# Patient Record
Sex: Male | Born: 1950
Health system: Southern US, Community
[De-identification: ages and names within clinical notes are randomized; demographics above are authoritative.]

## PROBLEM LIST (undated history)

## (undated) DIAGNOSIS — Z8679 Personal history of other diseases of the circulatory system: Secondary | ICD-10-CM

## (undated) DIAGNOSIS — Z972 Presence of dental prosthetic device (complete) (partial): Secondary | ICD-10-CM

## (undated) DIAGNOSIS — E119 Type 2 diabetes mellitus without complications: Secondary | ICD-10-CM

## (undated) DIAGNOSIS — I219 Acute myocardial infarction, unspecified: Secondary | ICD-10-CM

## (undated) DIAGNOSIS — I2510 Atherosclerotic heart disease of native coronary artery without angina pectoris: Secondary | ICD-10-CM

## (undated) DIAGNOSIS — E785 Hyperlipidemia, unspecified: Secondary | ICD-10-CM

## (undated) DIAGNOSIS — C801 Malignant (primary) neoplasm, unspecified: Secondary | ICD-10-CM

## (undated) DIAGNOSIS — R0600 Dyspnea, unspecified: Secondary | ICD-10-CM

## (undated) DIAGNOSIS — J449 Chronic obstructive pulmonary disease, unspecified: Secondary | ICD-10-CM

## (undated) DIAGNOSIS — Z950 Presence of cardiac pacemaker: Secondary | ICD-10-CM

## (undated) DIAGNOSIS — Z720 Tobacco use: Secondary | ICD-10-CM

## (undated) DIAGNOSIS — M1990 Unspecified osteoarthritis, unspecified site: Secondary | ICD-10-CM

## (undated) DIAGNOSIS — J189 Pneumonia, unspecified organism: Secondary | ICD-10-CM

## (undated) DIAGNOSIS — Z9889 Other specified postprocedural states: Secondary | ICD-10-CM

## (undated) DIAGNOSIS — Z973 Presence of spectacles and contact lenses: Secondary | ICD-10-CM

## (undated) DIAGNOSIS — D135 Benign neoplasm of extrahepatic bile ducts: Secondary | ICD-10-CM

## (undated) DIAGNOSIS — K219 Gastro-esophageal reflux disease without esophagitis: Secondary | ICD-10-CM

## (undated) DIAGNOSIS — S82301A Unspecified fracture of lower end of right tibia, initial encounter for closed fracture: Secondary | ICD-10-CM

## (undated) DIAGNOSIS — F40240 Claustrophobia: Secondary | ICD-10-CM

## (undated) DIAGNOSIS — I10 Essential (primary) hypertension: Secondary | ICD-10-CM

## (undated) HISTORY — PX: APPENDECTOMY: SHX54

## (undated) HISTORY — PX: OTHER SURGICAL HISTORY: SHX169

## (undated) HISTORY — DX: Essential (primary) hypertension: I10

## (undated) HISTORY — PX: CORONARY ANGIOPLASTY WITH STENT PLACEMENT: SHX49

## (undated) HISTORY — PX: HERNIA REPAIR: SHX51

## (undated) HISTORY — DX: Hyperlipidemia, unspecified: E78.5

## (undated) HISTORY — DX: Atherosclerotic heart disease of native coronary artery without angina pectoris: I25.10

---

## 2001-06-20 HISTORY — PX: CORONARY ARTERY BYPASS GRAFT: SHX141

## 2001-08-13 ENCOUNTER — Inpatient Hospital Stay (HOSPITAL_COMMUNITY): Admission: AD | Admit: 2001-08-13 | Discharge: 2001-08-21 | Payer: Self-pay | Admitting: Cardiology

## 2001-08-16 ENCOUNTER — Encounter: Payer: Self-pay | Admitting: Thoracic Surgery (Cardiothoracic Vascular Surgery)

## 2001-08-17 ENCOUNTER — Encounter: Payer: Self-pay | Admitting: Thoracic Surgery (Cardiothoracic Vascular Surgery)

## 2001-08-18 ENCOUNTER — Encounter: Payer: Self-pay | Admitting: Thoracic Surgery (Cardiothoracic Vascular Surgery)

## 2001-08-19 ENCOUNTER — Encounter: Payer: Self-pay | Admitting: Surgery

## 2001-08-20 ENCOUNTER — Encounter: Payer: Self-pay | Admitting: Surgery

## 2004-06-20 HISTORY — PX: ABDOMINAL AORTIC ANEURYSM REPAIR: SUR1152

## 2004-08-09 ENCOUNTER — Encounter: Admission: RE | Admit: 2004-08-09 | Discharge: 2004-08-09 | Payer: Self-pay | Admitting: *Deleted

## 2004-08-19 ENCOUNTER — Ambulatory Visit: Payer: Self-pay | Admitting: Cardiology

## 2004-08-20 ENCOUNTER — Ambulatory Visit: Payer: Self-pay | Admitting: Cardiology

## 2004-08-26 ENCOUNTER — Ambulatory Visit: Payer: Self-pay | Admitting: Cardiology

## 2004-08-27 ENCOUNTER — Ambulatory Visit: Payer: Self-pay | Admitting: Internal Medicine

## 2004-08-27 ENCOUNTER — Inpatient Hospital Stay (HOSPITAL_BASED_OUTPATIENT_CLINIC_OR_DEPARTMENT_OTHER): Admission: RE | Admit: 2004-08-27 | Discharge: 2004-08-27 | Payer: Self-pay | Admitting: Cardiology

## 2004-08-31 ENCOUNTER — Ambulatory Visit: Payer: Self-pay | Admitting: Cardiology

## 2004-09-03 ENCOUNTER — Ambulatory Visit (HOSPITAL_COMMUNITY): Admission: RE | Admit: 2004-09-03 | Discharge: 2004-09-03 | Payer: Self-pay | Admitting: *Deleted

## 2004-09-14 ENCOUNTER — Encounter (INDEPENDENT_AMBULATORY_CARE_PROVIDER_SITE_OTHER): Payer: Self-pay | Admitting: *Deleted

## 2004-09-14 ENCOUNTER — Inpatient Hospital Stay (HOSPITAL_COMMUNITY): Admission: RE | Admit: 2004-09-14 | Discharge: 2004-09-19 | Payer: Self-pay | Admitting: *Deleted

## 2005-08-30 IMAGING — CR DG CHEST 2V
2 series · 2 of 2 positions shown · non-contrast
Comparison: none

CLINICAL DATA: Abdominal aortic aneurysm.  Preop for OR today.  Smoker, hypertension.
CHEST - 2 VIEW:
Patient had a chest x-ray on 08/20/01, which at the time of this reading, is not available for correlation, however, the report states that at that time, there were postoperative changes, i.e., post CABG.  Today?s exam reveals mild prominence of the appearance of the left hilum, however, I feel that this is due to pulmonary vascularity.  This will be compared with prior exam when received.  Suspicion for COPD and chronic peribronchitic changes.  Cardiac size within normal limits.  No vascular congestion.

[view not recorded (1 of 2)]
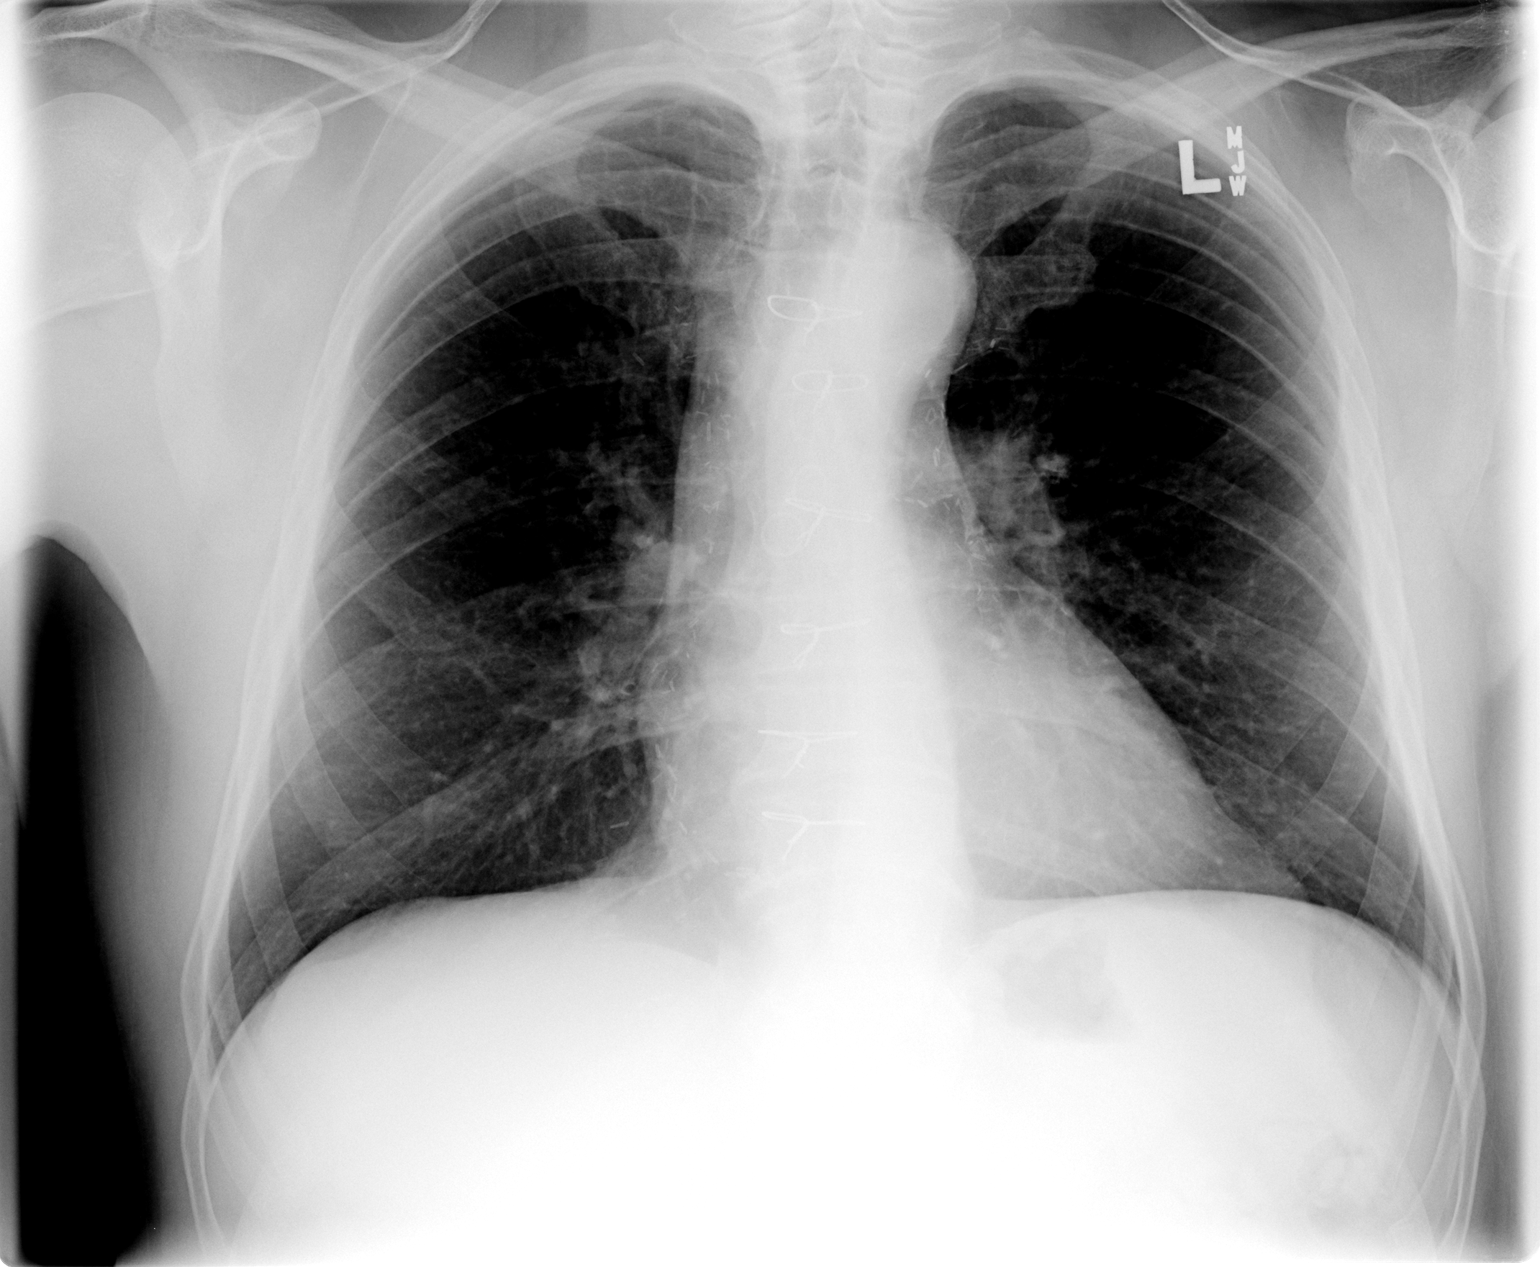

[view not recorded (2 of 2)]
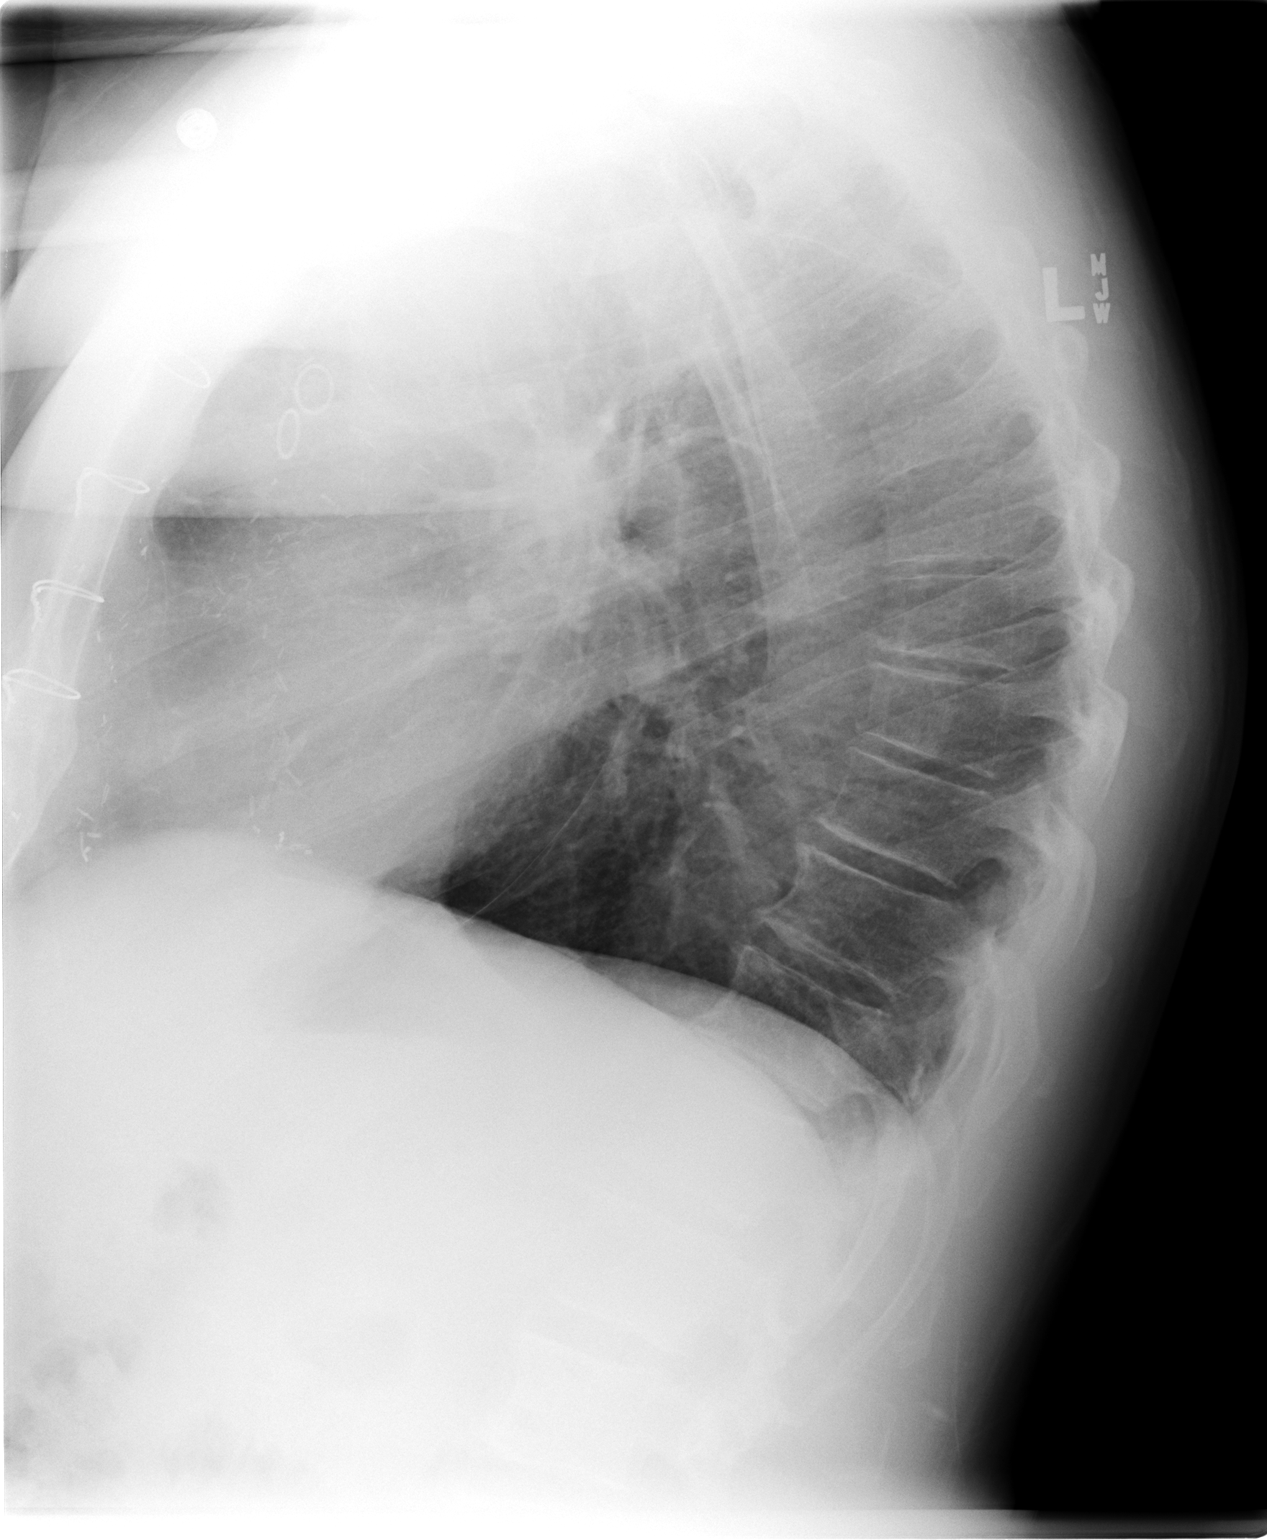

[2 of 2 positions shown; findings below may reference images not displayed]

IMPRESSION: CABG.  Query COPD.  No acute chest findings.

## 2007-04-04 ENCOUNTER — Ambulatory Visit: Payer: Self-pay | Admitting: Cardiology

## 2007-04-04 ENCOUNTER — Inpatient Hospital Stay (HOSPITAL_COMMUNITY): Admission: EM | Admit: 2007-04-04 | Discharge: 2007-04-06 | Payer: Self-pay | Admitting: Emergency Medicine

## 2007-04-04 ENCOUNTER — Ambulatory Visit: Payer: Self-pay | Admitting: Cardiovascular Disease

## 2007-05-03 ENCOUNTER — Ambulatory Visit: Payer: Self-pay | Admitting: Cardiology

## 2007-05-07 ENCOUNTER — Ambulatory Visit: Payer: Self-pay | Admitting: Cardiology

## 2007-06-25 ENCOUNTER — Ambulatory Visit: Payer: Self-pay | Admitting: Cardiology

## 2007-08-02 ENCOUNTER — Ambulatory Visit: Payer: Self-pay | Admitting: Cardiology

## 2007-11-29 ENCOUNTER — Ambulatory Visit: Payer: Self-pay | Admitting: Cardiology

## 2008-03-28 ENCOUNTER — Ambulatory Visit: Payer: Self-pay | Admitting: Cardiology

## 2008-08-06 ENCOUNTER — Ambulatory Visit: Payer: Self-pay | Admitting: Cardiology

## 2008-09-18 ENCOUNTER — Ambulatory Visit: Payer: Self-pay | Admitting: Cardiology

## 2008-10-14 ENCOUNTER — Ambulatory Visit: Payer: Self-pay | Admitting: Cardiology

## 2009-03-20 DIAGNOSIS — I25810 Atherosclerosis of coronary artery bypass graft(s) without angina pectoris: Secondary | ICD-10-CM

## 2009-03-20 DIAGNOSIS — E785 Hyperlipidemia, unspecified: Secondary | ICD-10-CM

## 2009-04-03 ENCOUNTER — Encounter: Payer: Self-pay | Admitting: Cardiology

## 2009-04-03 ENCOUNTER — Ambulatory Visit: Payer: Self-pay | Admitting: Cardiology

## 2009-05-22 ENCOUNTER — Encounter (INDEPENDENT_AMBULATORY_CARE_PROVIDER_SITE_OTHER): Payer: Self-pay | Admitting: *Deleted

## 2009-07-27 ENCOUNTER — Encounter: Payer: Self-pay | Admitting: Cardiology

## 2009-07-27 ENCOUNTER — Ambulatory Visit: Payer: Self-pay | Admitting: Cardiology

## 2009-09-15 ENCOUNTER — Encounter (INDEPENDENT_AMBULATORY_CARE_PROVIDER_SITE_OTHER): Payer: Self-pay | Admitting: *Deleted

## 2010-07-22 NOTE — Miscellaneous (Signed)
  Clinical Lists Changes  Observations: Added new observation of RS STUDY: TRACER - study completion 07/27/09 (09/15/2009 11:17)      Research Study Name: TRACER - study completion 07/27/09

## 2010-11-02 NOTE — H&P (Signed)
NAME:  Clifford Jones, JABS NO.:  [id number]   MEDICAL RECORD NO.:  [id number]          PATIENT TYPE:  INP   LOCATION:  2905                         FACILITY:  [facility]   PHYSICIAN:  Audery Amel, MD    DATE OF BIRTH:  1950/07/05   DATE OF ADMISSION:  04/04/2007  DATE OF DISCHARGE:                              HISTORY & PHYSICAL   PRIMARY CARDIOLOGIST:  Dr. Lewayne Bunting   CHIEF COMPLAINT:  Chest pain.   HISTORY OF PRESENT ILLNESS:  The patient is a 60 year old white male  with known past medical history of coronary artery disease status post  percutaneous coronary intervention in his 18s and subsequently CABG at  the age of 3, who was transferred from [facility] for further evaluation  of chest pain.  The patient was watching television earlier this evening  when he suddenly developed severe left-sided chest pain.  The patient  denied any radiation to the left arm, neck, jaw or back.  He  characterized the pain as very sharp, 10/10, and different from the  symptoms he has experienced with his prior myocardial infarctions.  He  denied any associated shortness of breath, dyspnea on exertion, nausea,  diaphoresis, palpitations, presyncope or syncope.  The patient drove  himself immediately to the [facility] emergency department for evaluation.  On arrival there, his initial EKG did reveal approximately 1- to 2-mm of  ST elevation in leads III and aVF.  The patient states that his chest  pain had resolved spontaneously prior to his arrival to [place].  A  subsequent EKG there revealed no ST elevation.  On arrival to the CCU  here at [facility], the patient was again chest-pain free.  Another EKG  was obtained which revealed normal sinus rhythm with evidence of prior  inferior and anterior myocardial infarction.  There were no dynamic ST  changes to suggest acute injury.  The patient was notably hypertensive  on arrival but otherwise without complaint.   PAST MEDICAL  HISTORY:  1. Coronary artery disease status post percutaneous coronary      intervention at age 18.  He subsequently had a CABG procedure      performed at age 37 with a LIMA to LAD, RIMA to the distal RCA, and      vein graft to D-1 and an OM.  The patient had a cardiac      catheterization in 2006 prior to a AAA surgery which revealed      patent grafts and preserved left ventricular function.  2. Status post AAA repair.  3. Hypertension.  4. Hyperlipidemia.  5. Ongoing tobacco use.   CURRENT MEDICATIONS:  Aspirin 325 mg once daily.   ALLERGIES:  No known drug allergies.   SOCIAL HISTORY:  The patient has continued to smoke approximately one  pack per day.  He uses occasional alcohol but denies any illicit  substances.  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   FAMILY HISTORY:  There is a significant family history of early coronary  disease.  His  father died in his 61s from a myocardial infarction.  His  mother died late in life from natural causes.  Interestingly, none of  his siblings have coronary disease.   PHYSICAL EXAMINATION:  Blood pressure is 166/95, heart rate is 69, O2  saturations are 97% on room air.  GENERAL:  The patient is alert and oriented x3 in no acute distress and  pleasantly conversant.  HEENT:  Normocephalic, atraumatic, EOMI, PERL, nares patent, O&P is  clear without erythema or exudate.  NECK:  Supple, full range of motion, no JVD.  His carotid upstrokes are  equal and symmetric bilaterally with no audible bruits.  CHEST:  Reveals coarse breath sounds bilaterally with no audible  wheezing.  There is no chest wall deformity or tenderness.  He has a  well-healed median sternotomy scar.  CARDIOVASCULAR EXAMINATION:  Normal S1, S2 with no audible murmurs, rubs  or gallops.  His PMI is nondisplaced in the left midclavicular line.  His peripheral pulses are 1+ and symmetric bilaterally.  ABDOMEN:  Soft, nontender,  nondistended.  Positive bowel sounds.  No  hepatosplenomegaly.  EXTREMITIES:  Reveal no clubbing, cyanosis or edema.  He is 2+ bilateral  femoral pulses with no audible bruits.  NEUROLOGIC:  Grossly nonfocal.  Sensation and strength are grossly  intact throughout.  PSYCHIATRIC:  Appropriate insight and judgment.   EKG:  Normal sinus rhythm with evidence of prior inferior and anterior  infarction.  There is no dynamic ST- or T-wave changes to suggest acute  injury or ischemia.   Labs pending.   IMPRESSION:  1. Acute coronary syndrome.  2. Coronary artery disease status post coronary artery bypass graft      surgery.  3. Abdominal aortic aneurysm status post repair in 2006.  4. Hypertension.  5. Hyperlipidemia.   PLAN:  From a cardiovascular standpoint, Clifford Jones is currently  hemodynamically stable and chest-pain free.  On review of his  electrocardiograms from [facility], his initial study did reveal ST-  elevation in the inferior leads (III and aVF).  The patient states that  his pain resolved spontaneously before arriving at [facility] and he has  had no significant symptoms since then.  His subsequent EKGs have  revealed no ST-elevation or changes concerning for acute injury.  We  will admit the patient to the CCU and cycle his cardiac biomarkers.  The  patient was initiated on unfractionated heparin at [facility] and we will  continue with this mode of therapy.  He does take aspirin daily and I  will continue 325 mg p.o. daily.  At this point, the patient is  experiencing no chest pain, he has no dynamic ST changes, and therefore  we will await his cardiac biomarkers for initiating a IIb/IIIa  inhibitor.  The patient continues to be quite hypertensive and we will  treat with metoprolol IV and p.o.  In addition, I will initiate  enalapril 10 mg p.o. daily.  We will titrate his nitroglycerin drip for  additional blood pressure control as needed.  We will check a fasting  lipid  profile, start atorvastatin 80 mg daily.  Will check a  transthoracic echocardiogram in a.m. to assess his left ventricular  function and wall motion.  Given the nature of his presentation and his  history, the patient will likely require repeat cardiac catheterization  to assess the status of his bypass graft anatomy.  It should be noted  that the patient is somewhat adverse to taking medications and therefore  a drug-eluting stent may not be in his best interests, as it is unlikely  that the patient will remain on Plavix for the recommended 1 year.  We  will maintain the patient n.p.o. in anticipation of a cardiac  catheterization in the a.m.     Audery Amel, MD  Electronically Signed    SHG/MEDQ  D:  04/04/2007  T:  04/04/2007  Job:  [id number]

## 2010-11-02 NOTE — Assessment & Plan Note (Signed)
[facility]                          [facility] NOTE   ELMER, MERWIN                       MRN:          [id number]  DATE:08/06/2008                            DOB:          [birth year]    HISTORY OF PRESENT ILLNESS:  The patient is a 60 year old male with  history of coronary artery disease and peripheral vascular disease.  The  patient is status post non-ST-elevation myocardial infarction in October  2008.  Despite fact that he still smokes 3-5 cigarettes a day.  He is  actually doing quite well.  He has no shortness of breath on exertion  and has no chest pain.  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  He has no limitations in his physical activity.   MEDICATIONS:  1. Enalapril 10 mg p.o. b.i.d.  2. Enteric-coated aspirin 325 daily.  3. Plavix 75 daily.  4. Metoprolol 25 half tablet p.o. b.i.d.  5. Chlorothiazide 25 mg p.o. daily.  6. Nexium 40 mg p.o. b.i.d.  7. Citalopram 20 mg p.o. daily.  8. Simvastatin 20 mg p.o. daily.   PHYSICAL EXAMINATION:  VITAL SIGNS:  Blood pressure is 143/86, in the  right arm which is 133/81, heart rate 67, and weight 208.  NECK:  Normal carotid upstrokes.  No carotid bruits.  LUNGS:  Clear breath sounds bilaterally.  HEART:  Regular rate and rhythm.  Normal S1 and S2.  No murmurs or  gallops.  ABDOMEN:  Soft and nontender.  No rebound or guarding.  Good bowel  sounds.  EXTREMITIES:  No cyanosis, clubbing, or edema.  NEUROLOGIC:  The patient is alert, oriented, and grossly normal.  Nonfocal.   PROBLEM LIST:  1. Coronary artery disease, stable.  2. Preserved left ventricular function with no heart failure.  3. Abdominal aortic aneurysm, status post repair.  4. Hypertension, controlled.  5. Dyslipidemia followed by Dr. Dimas Aguas.  6. Tobacco use.  7. I advised the patient to revise his tobacco use, but he states he      has cut down 3-5 cigarettes which he will  continue.  8. I do not think we will need to adjust his medications for blood      pressure any further.  He can follow up with Dr. Dimas Aguas.  9. Lipids panel was also followed by Dr. Dimas Aguas.  The patient in 1      year will need a Cardiolite stress testing.      Learta Codding, MD,FACC  Electronically Signed    GED/MedQ  DD: 08/06/2008  DT: 08/07/2008  Job #: [id number]   cc:   Selinda Flavin

## 2010-11-02 NOTE — Discharge Summary (Signed)
NAME:  Clifford Jones, Clifford Jones NO.:  [id number]   MEDICAL RECORD NO.:  [id number]          PATIENT TYPE:  INP   LOCATION:  6522                         FACILITY:  [facility]   PHYSICIAN:  Pricilla Riffle, MD, FACCDATE OF BIRTH:  [birth year]   DATE OF ADMISSION:  04/04/2007  DATE OF DISCHARGE:  04/06/2007                         DISCHARGE SUMMARY - REFERRING   Please note that I, Joellyn Rued, PA-C, have not been involved with the  patient's actual discharge.   DISCHARGE DIAGNOSES:  1. Non-ST-elevation myocardial infarction.  2. Progressive coronary artery disease.  3. Status post bare-metal stenting to the saphenous vein graft to the      obtuse marginal #1.  4. Hyperlipidemia.  5. Continued tobacco use.  6. History as noted below.   PROCEDURES PERFORMED:  Cardiac catheterization with bare-metal stenting  to the saphenous vein graft to the first obtuse marginal on April 04, 2007 by Dr. Excell Seltzer.   BRIEF HISTORY:  Clifford Jones is a 60 year old white male who was  transferred from [facility] for further evaluation of chest  discomfort.  Apparently while watching TV, he developed severe left-  sided chest discomfort without radiation.  He described it as sharp and  gave it a 10 over a scale of 10 without associated shortness of breath,  nausea, vomiting or diaphoresis.  He drove to the emergency room for  evaluation.  EKG showed ST-segment elevations in III and aVF.  However,  at the time of arrival, his chest discomfort had resolved.  A subsequent  EKG did not show any elevation; thus, he was transferred to [facility] for  further evaluation.   PAST MEDICAL HISTORY:  Notable for:  1. PCI at the age of 60.  2. Bypass procedure at the age of 60 with a LIMA to the LAD, RIMA to      the RCA, saphenous vein graft to diagonal #1 and OM.  3. Repeat catheterization in 2006 revealed patent grafts, normal LV      function.  4. Status post AAA repair.  5. Hypertension.  6.  Hyperlipidemia.  7. Continued tobacco use.   LABORATORY DATA:  On the 15th, H&H were 15.2 and 44.6, normal indices,  platelets 264,000, WBC 8.5.  PTT 76, PT 12.9.  Sodium 37, potassium 4.4,  BUN 13, creatinine 0.91, glucose 151.  Normal LFTs.  CK totals were  within normal limits.  MBs were slightly elevated between 6.5 and 8.9.  Troponins were slightly elevated at 0.25, 0.99 and 0.59.  BNP was less  than 30.  Fasting lipid showed a total cholesterol of 231, triglycerides  265, HDL 35, LDL 53.  TSH 0.795.   EKGs showed normal sinus rhythm and inferior Q waves.   HOSPITAL COURSE:  Dr. Cheree Ditto accepted the patient in transfer from  [facility].  He was placed on IV heparin.  Given his mild  troponin elevation and initial EKG changes, it was felt that he should  undergo cardiac catheterization.  Cardiac Research became involved with  the patient and he was randomized to the Tracer Trial.  On April 04, 2007, he underwent cardiac catheterization by Dr. Excell Seltzer; this showed  three-vessel coronary artery disease.  LAD to the LIMA was patent.  Saphenous vein graft to the OM was patent.  RIMA to the RCA was patent.  The saphenous vein graft to diagonal #1 had 90% and 80% percent lesions.  Bare-metal stenting was performed to the saphenous vein graft to the OM.  Echocardiogram was performed, however, at the time of this dictation,  results are not available.  On October 16, post catheterization, he was  doing well and had not had any further chest discomfort.  He was  counseled several times as well as tobacco cessation consult in regards  to tobacco cessation.  Medications were adjusted.  Progression nurse and  Cardiac Rehab assisted with education, ambulation and discharge needs.  On October 17, after review with Dr. Tenny Craw, Dr. Tenny Craw felt that the  patient could be discharged home.   DISPOSITION:  According to Dr. Charlott Rakes discharge information, the patient  was discharged home with  instructions to maintain low-sodium, heart-  healthy diet, wound care per supplemental sheet.  Activities were  restricted for 2 days.   FOLLOWUP:  He will follow up with Dr. Andee Lineman in the [facility] office on  April 23, 2007 at 12 o'clock.   MEDICATIONS:  1. Aspirin 325 mg daily.  2. Plavix 75 mg daily.  3. Nitroglycerin 0.4 as needed.  4. Tracer Research study drug.  5. Zocor 80 mg at bedtime.  6. Enalapril 10 mg b.i.d.  7. Toprol-XL 25 mg daily.   SPECIAL DISCHARGE INSTRUCTIONS:  He is advised no smoking or tobacco  products and to bring all medications to all appointments.      Joellyn Rued, PA-C      Pricilla Riffle, MD, [facility]  Electronically Signed    EW/MEDQ  D:  05/02/2007  T:  05/02/2007  Job:  [id number]   cc:   Learta Codding, MD,FACC

## 2010-11-02 NOTE — Assessment & Plan Note (Signed)
[facility]                          [facility] NOTE   Clifford Jones, Clifford Jones                       MRN:          [id number]  DATE:05/07/2007                            DOB:          [birth year]    PRIMARY CARDIOLOGIST:  Dr. Lewayne Bunting.   PRIMARY CARE PHYSICIAN:  Dr. Selinda Flavin.   REASON FOR VISIT:  Post hospitalization followup.   HISTORY OF PRESENT ILLNESS:  Clifford Jones is a 60 year old male patient  with a prior history of coronary artery disease status post multiple  percutaneous coronary interventions in the past and subsequent CABG in  March 2003 as well as abdominal aortic aneurysm status post repair in  2006 who recently presented to [facility] in transfer from  [facility] with complaints of acute onset of chest pain. He had  transient inferior ST elevation that resolved spontaneously. After  transfer to [facility] he eventually ruled in for non ST  elevation myocardial infarction. His peak troponin was 0.99. He was  taken to the cardiac catheterization lab by Dr. Tonny Bollman on  April 05, 2007.   The patient's cardiac catheterization was notable for severe native  three-vessel COPD, a patent LIMA to the LAD and a patent RIMA to the  distal RCA. He did have a severely diseased saphenous vein graft to the  first diagonal and a severely diseased saphenous vein graft to the  obtuse marginal #1. He underwent percutaneous coronary intervention by  Dr. Excell Seltzer with placement of 3 bare metal stents to the vein graft to  the first obtuse marginal branch of the left circumflex. He elected not  to intervene on the vein graft to the diagonal branch as the diagonal  branch was relatively small. The vein graft was extensively diseased.  Continued medical therapy and risk factor modification was recommended.   The patient was apparently enrolled in the TRACER trial. He is on study  medication and has actually seen  the research nurses back in [place]  on one occasion since discharge from the hospital. The patient presents  to the office today for followup. He does note some fatigue. Other than  that, he denies any recurrent chest pain or shortness of breath. He  denies any exertional chest heaviness or tightness. He denies any  orthopnea, PND or pedal edema. He denies any syncope or near syncope.   CURRENT MEDICATIONS:  1. TRACER trial study drug.  2. Enalapril 10 mg b.i.d.  3. Aspirin 325 mg daily.  4. Nexium 40 mg daily.  5. Plavix 75 mg daily.  6. Pravastatin 40 mg 2 tablets q.h.s.  7. Metoprolol 25 mg 1/2-tablet b.i.d.  8. Nitroglycerin p.r.n. chest pain.   PHYSICAL EXAMINATION:  He is a well-nourished, well-developed male in no  acute distress.  Blood pressure is 152/91, pulse 69, weight 207.2 pounds. Repeat blood  pressure on the left is 152/86, on the right 150/88.  HEENT:  Normal.  NECK:  Without JVD.  CARDIAC:  S1, S2. Regular rate and rhythm without murmur or rub.  LUNGS:  Clear to auscultation bilaterally without  wheezing, rhonchi  rales.  ABDOMEN:  Soft, nontender with normal active bowel sounds. No  organomegaly.  EXTREMITIES:  Without edema. Calves soft, nontender. Right femoral  arteriotomy site without hematoma or bruit.  SKIN:  Warm and dry.  NEUROLOGIC:  She is alert and oriented x3. Cranial nerves II-XII are  grossly intact.   Electrocardiogram  reveals sinus rhythm with a heart rate of 65, normal  axis. Nonspecific ST-T wave changes.   IMPRESSION:  1. Coronary artery disease.      a.     Status post recent non ST elevation myocardial infarction       October 2008 treated with bare metal stenting x3 to the vein graft       of the obtuse marginal.      b.     Status post coronary artery bypass graft in 2003 - left       internal mammary artery to the left anterior descending and right       internal mammary artery to the right coronary artery both patent        at catheterization; vein graft to the diagonal branch severely       diseased and treated medically.      c.     Remote history of multiple percutaneous coronary       interventions.  2. Preserved left ventricular function with an ejection fraction of 55-      60%.  3. Abdominal aortic aneurysm.      a.     Status post repair with 16 x 8 bifurcated aortobi-iliac       graft in 2006 by Dr. Edilia Bo.  4. Hypertension.  5. Hyperlipidemia.  6. Tobacco abuse.   PLAN:  [name] presents to the office today for post hospitalization  followup. As noted above, he had a recent ST elevation myocardial  infarction treated with PCI to the vein graft of the OM. He is involved  in the TRACER trial and has followup arranged with our research staff in  [place]. When he was discharged from the hospital he was apparently  given simvastatin and pravastatin. He is only taking pravastatin at this  point in time. His blood pressure is mildly elevated but he did have a  can of potato chips before coming to the office today. He is somewhat  fatigued but this is stable. He has been unable to enroll in cardiac  rehab secondary to cost. At this point in time, we plan to:   1. Check a BMET and CBC secondary to his complaints of fatigue.  2. I have asked him to try to enroll in cardiac rehab if at all      possible. If the cost is an issue then he can increase his activity      at home and I have outlined this for him.  3. We will arrange lipids and LFTs in 12 weeks.  4. He will return next week for a blood pressure check. If his blood      pressure is still above 140/90, we will adjust his medications      appropriately.  5. He has cut down on his smoking and I have applauded him for this. I      have explained to him the necessity of quitting smoking.  6. He will return to this clinic in 6 weeks or sooner p.r.n.      Tereso Newcomer, PA-C  Electronically Signed  Learta Codding, MD,FACC   Electronically Signed   SW/MedQ  DD: 05/07/2007  DT: 05/08/2007  Job #: [id number]   cc:   Selinda Flavin

## 2010-11-02 NOTE — Assessment & Plan Note (Signed)
[facility]                          [facility]   Clifford Jones, Clifford Jones                       MRN:          [id number]  DATE:06/25/2007                            DOB:          1950/09/19    REFERRING PHYSICIAN:  Selinda Flavin   HISTORY OF PRESENT ILLNESS:  The patient is a 60 year old male with a  history of coronary artery disease and peripheral vascular disease.  The  patient is status post non-ST elevation myocardial infarction in October  2008, treated with a bare metal stent x3 to a vein graft, the obtuse  marginal branch.  The patient has been doing well.  He has had no  recurrent chest pain.  He has no orthopnea, PND or palpitations.  He has  cut back on his smoking.  He does remain hypertensive and a note was  made of this during his last office visit.   LABORATORY DATA:  He had laboratory work done which was essentially  within normal limits.  His lipid panels are followed by Dr. Selinda Flavin.   CURRENT MEDICATIONS:  1. Endo-tracer study.  2. Enalapril 10 mg p.o. twice daily.  3. Enteric-coated aspirin 325 mg daily.  4. Nexium 40 mg daily.  5. Plavix 75 mg p.o. daily.  6. Pravastatin 80 mg p.o. q.h.s.  7. Metoprolol 25 mg, 1/2 tab p.o. twice daily.   PHYSICAL EXAMINATION:  VITAL SIGNS:  Blood pressure 175/94, heart rate  70 beats per minute, weight 206 pounds.  NECK:  Normal carotid upstroke.  No carotid bruits.  LUNGS:  Clear breath sounds bilaterally.  HEART:  A regular rate and rhythm. Normal S1 and S2.  No murmurs, rubs  or gallops.  ABDOMEN:  Soft.  EXTREMITIES:  No clubbing, cyanosis or edema.   PROBLEMS:  1. Coronary artery disease      a.     Status post non-ST elevation myocardial infarction in       October  2008.      b.     Status post bare metal stent x3 to the vein graft, obtuse       marginal branch.      c.     Status post coronary artery bypass graft surgery in 2003.      d.     Remote history  of multiple percutaneous coronary       interventions.  2. Preserved left ventricular function with an ejection fraction of      55%-60%.  3. Abdominal aortic aneurysm      a.     Status post repair with a 16 x 8 bifurcated aorto-bi-iliac       graft in 2006, by Dr. Di Kindle. Edilia Bo.      b.     No recent ultrasonographic followup.  4. Hypertension.  5. Dyslipidemia.  6. Tobacco abuse.   PLAN:  1. The patient is doing well from a cardiovascular perspective.  He      has no substernal chest pain.  2. His blood pressure remains uncontrolled and I have added  hydrochlorothiazide 25 mg p.o. daily to his medical regimen.  3. I have asked the patient for to follow up with Dr. Dimas Aguas regarding      his lipid panel.  4. I also spoke to his daughter and to the patient about follow-up      abdominal ultrasound, given his prior history of an abdominal      aortic aneurysm repair.     Learta Codding, MD,FACC  Electronically Signed    GED/MedQ  DD: 06/25/2007  DT: 06/25/2007  Job #: [id number]   cc:   Selinda Flavin

## 2010-11-02 NOTE — Cardiovascular Report (Signed)
NAME:  VADHIR, MCNAY NO.:  [id number]   MEDICAL RECORD NO.:  [id number]          PATIENT TYPE:  INP   LOCATION:  2905                         FACILITY:  [facility]   PHYSICIAN:  Veverly Fells. Excell Seltzer, MD  DATE OF BIRTH:  Nov 28, 1950   DATE OF PROCEDURE:  04/05/2007  DATE OF DISCHARGE:                            CARDIAC CATHETERIZATION   PROCEDURES:  1. Coronary angiography.  2. Saphenous vein graft angiography.  3. Abdominal aortic angiography.  4. Left internal mammary artery and right internal mammary artery      angiography.  5. Percutaneous coronary intervention with bare metal stenting of the      saphenous vein graft to the obtuse marginal branch of the left      circumflex.   INDICATIONS:  Mr. Maffei is a 60 year old gentleman with extensive  coronary artery disease.  He has not had regular medical follow-up and  continues a pattern of noncompliance with ongoing tobacco and  nonadherence to medical therapy.  He presented with chest pain and ruled  in for a non-ST-elevation myocardial infarction.  He was referred for  cardiac catheterization in that setting.  He also has extensive vascular  disease and has had a previous abdominal aortic aneurysm repair.   Risks and indications of the procedure were reviewed with the patient  and informed consent was obtained.  The right groin was prepped, draped,  anesthetized with 1% lidocaine.  Using the modified Seldinger technique,  a 6-French sheath was placed in the right femoral artery.  Due to  extreme iliac tortuosity, I was unable to pass a J-wire into the aorta.  Using a JR-4 catheter and a Wholey wire, I was able to advance the wire  and catheter up into the abdominal aorta with some degree of difficulty.  I then exchanged out for a pigtail catheter and performed an abdominal  aortogram.  This demonstrated severe right iliac tortuosity and an  eccentric plaque just above the graft site in the abdominal aorta.  Unfortunately, at that point we the procedure had to be delayed as there  was an acute myocardial infarction in the emergency department.  The  catheter was removed and the sheath was left in place and we brought Mr.  Krinke back into the catheterization lab a few hours later.  Upon  resuming the procedure, I changed out the 6-French sheath for a new  sterile 6-French sheath over a short J-tip wire.  A Wholey wire and 5-  [place] JR-4 catheter were used to navigate the iliac artery and changed  out for a [employer] wire.  The Rosen wire was used to place a 45-cm 6-French  sheath so that we could obtain better support for performing  angiography.  Two thousand units of heparin was given.  Selective native  vessel coronary angiography was performed with standard 6-French Judkins  catheters, demonstrating severe native vessel CAD.  Following selective  coronary angiography, saphenous vein graft angiography was performed  with a JR-4 catheter, demonstrating severe degenerated saphenous vein  graft disease.  At that point I attempted to perform RIMA and LIMA  angiography with the JR-4 catheter.  This was extremely difficult.  I  was able to engage the right subclavian artery with the JR-4 catheter  and ultimately, using a Wholey wire, I was able to pass the JR-4  catheter into the distal right subclavian artery beyond the RIMA but was  never able to selectively engage the RIMA.  During a catheter exchange,  there was a severe kink that formed in the 6-French sheath and we were  forced to change that out.  I changed out to a 7-French 45-cm sheath at  that point.  An aortic arch angiogram was then performed to better  delineate the head and neck vessels.  Ultimately I imaged the RIMA  nonselectively with a BER-1 catheter in the innominate artery.  The RIMA  was visualized enough to determine patency but I was unable to visualize  distal runoff.  The BER-1 catheter was then used to enter the left   subclavian artery and using an exchange-length Wholey wire, I changed  out for a 5-French LIMA catheter and selective LIMA angiography was  performed.   At the conclusion of the diagnostic procedure I elected to intervene on  the saphenous vein graft to the OM.  A 6-French LCB guide catheter was  used.  Angiomax was used for anticoagulation.  The patient was preloaded  with 600 mg of clopidogrel.  The vein graft had a long area of extensive  atheroma and there was high-grade stenosis in two areas of the graft.  The distal lesion was approximately 90% stenosed and the proximal lesion  produced 70% stenosis, but the entire vein graft was highly degenerated.  I initially attempted to use a filter wire for distal protection and I  was able to pass the filter wire beyond the areas of high-grade stenosis  but was unable to obtain enough room distally to deploy stents.  I then  changed out to a Cougar wire, which was passed into the distal vessel,  and attempted to pass a Spider distal protection device but the Spider  device would not pass beyond the severe stenosis in the proximal body of  the vein graft.  After multiple attempts to pass the device, I elected  to change once again to a Proxis balloon occlusion device for proximal  embolic protection.  A 6-French Proxis device was passed into the  proximal body of the graft.  A test inflation was performed and the  patient tolerated this well.  I primarily stented the distal portion of  the graft with a 3.5 x 20-mm Liberte stent.  The area was stented and  this was well-tolerated.  I then stented the proximal body of the graft  with a 3.5 x 20-mm Liberte stent.  Both stents were deployed at 16  atmospheres.  Following stenting there was an intervening segment that  developed a filling defect and I elected to cover this aspect of the  vein graft as well.  This was treated with a 3.5 x 24-mm Liberte stent,  which was deployed at 16 atmospheres.   Following stenting there was TIMI-  3 flow in the graft and I felt that Mr. Sibal had received the maximum  benefit of his intervention.  All stent deployments were performed with  the Proxis device inflated and with proximal protection.  There was a  good bit of debris retrieved during the intervention.  A final angiogram  demonstrated widely patent and well-expanded stents.  At the completion  of  the procedure the long sheath was removed and a 7-French short sheath  was exchanged out and the patient was transferred to the holding area in  stable condition.   FINDINGS:  The abdominal aorta is heavily diseased.  There is a distal  aortic graft that is widely patent.  Just proximal to the graft there is  eccentric stenosis present that appears nonobstructive, and there is no  pressure gradient across this.  The right renal artery has moderate  stenosis.  The left renal artery is widely patent.   CORONARY ANGIOGRAPHY:  The left mainstem is patent.  There is mild left  mainstem stenosis but no significant obstruction.   The LAD is a medium-size vessel that is diffusely diseased.  The  proximal aspect of the LAD has a 50% stenosis just at the first  diagonal.  The midportion of the LAD has a long segment of severe  stenosis in the range of 80%.  The mid and distal aspects of the LAD  fill competitively.  The first diagonal branch has mild ostial stenosis  and diffuse 30%-50% stenosis throughout.  There is a large septal  cascade originating at the first septal perforator.   The left circumflex has 50% ostial stenosis.  The circumflex courses  down and supplies a small first OM branch that appears totally occluded.  The AV groove circumflex is also totally occluded beyond the OM branch.   The right coronary artery is occluded in its proximal aspect.   SAPHENOUS VEIN GRAFT ANGIOGRAPHY:  Saphenous vein graft to the first  diagonal is severely diseased.  The graft is extremely small and  has  multiple 80-90% stenoses throughout the entire graft.  The graft size  appears no greater than 2 mm throughout.     The saphenous vein graft to the first OM branch of the left circumflex  was severely degenerated.  There are two areas of severe focal stenosis.  In the proximal aspect of the graft there is a 70% stenosis.  In the  distal body the graft there is a severe 90% stenosis with a filling  defect suggestive of thrombus.  Distally the circumflex branch has a 70%  stenosis that was described at previous catheterization 2 years ago.  There is TIMI-3 flow in the vessel but the distal vessel is relatively  small.   The RIMA was not selectively injected.  Nonselective injection through  the innominate artery demonstrates a patent RIMA at least to the  midportion with apparent good runoff, but I am unable to visualize the  distal vessel.   The LIMA was selectively injected and is widely patent.  The mid and  distal LAD fill competitively from LIMA and native vessel flow and are  patent.  There appears to be moderate diffuse disease of the distal LAD.   ASSESSMENT:  1. Severe native three-vessel coronary artery disease.  2. Widely patent left internal mammary artery to mid left anterior      descending artery.  3. Patent right internal mammary artery to distal right coronary      artery with poor visualization of the distal vessel.  4. Patent but severely diseased saphenous vein graft to the first      diagonal.  5. Patent but severely diseased saphenous vein graft to obtuse      marginal #1.  6. Moderate to severe right renal artery stenosis.  7. Severe iliac tortuosity.  8. Successful percutaneous coronary intervention of the saphenous vein  graft to the first obtuse marginal branch of the left circumflex.   DISCUSSION:  Mr. Wickham has severe coronary artery disease.  Successful  PCI was performed as detailed.  I elected not to intervene on the vein  graft to the  diagonal branch.  The diagonal branch is relatively small  and the vein graft that is extensively diseased.  I think with the small  nature of the graft and severe diffuse disease that long-term durability  of an interventional procedure is very low and not worth the risk.  I  would favor ongoing aggressive medical therapy and obvious smoking  cessation and risk factor modification.   As detailed above, this was a complex procedure based on prolonged  fluoroscopy time, extensive iliac tortuosity, and intervention on a  degenerated saphenous vein graft with difficulty with embolic protection  as detailed.      Veverly Fells. Excell Seltzer, MD  Electronically Signed     MDC/MEDQ  D:  04/05/2007  T:  04/06/2007  Job:  [id number]

## 2010-11-05 NOTE — Op Note (Signed)
NAME:  Clifford Jones, Clifford Jones NO.:  [id number]   MEDICAL RECORD NO.:  [id number]          PATIENT TYPE:  OIB   LOCATION:  2899                         FACILITY:  [facility]   PHYSICIAN:  Balinda Quails, M.D.    DATE OF BIRTH:  Jan 22, 1951   DATE OF PROCEDURE:  09/03/2004  DATE OF DISCHARGE:                                 OPERATIVE REPORT   DIAGNOSIS:  A 6 cm abdominal aortic aneurysm.   PROCEDURE:  1.  Abdominal aortogram with pelvic runoff arteriography.  2.  AngioSeal closure right common femoral artery.   ACCESS:  Right common femoral artery 5-French sheath.   CONTRAST:  100 mL Visipaque.   COMPLICATIONS:  None apparent.   CLINICAL NOTE:  [name] is a 60 year old male with a history of coronary  artery disease. He has been seen in the office with an enlarging abdominal  aortic aneurysm. This was most recently measured 6 cm in diameter. He is  brought to the cath lab at this time for diagnostic arteriography.   PROCEDURE NOTE:  The patient brought to the cath lab in stable condition.  Placed in supine position. Both groins prepped and draped in sterile  fashion.   Skin, subcutaneous tissues instilled with 1% Xylocaine. A needle was easily  introduced in right common femoral artery. 0.035 J-wire passed through the  needle at the mid abdominal aorta. Needle removed, 5-French sheath advanced  over guidewire. The dilator removed and sheath flushed with heparin saline  solution.   A standard pigtail catheter was then advanced over the guidewire. This was  positioned in the suprarenal aorta. Standard AP mid abdominal aortogram  obtained. Single bilateral widely patent renal arteries present. Moderately  tortuous infrarenal aorta was present with left lateral deviation of the  aortic neck. A large infrarenal abdominal aortic aneurysm was present. There  was aneurysmal dilatation also noted in the right common iliac artery. Left  common iliac artery appeared to be  normal in caliber.   An oblique arteriogram obtained. This verified anterior angulation of the  infrarenal aorta. The superior mesenteric artery was widely patent. The  celiac artery also noted to be widely patent.   The pigtail catheter brought down to the aortic bifurcation. AP and  bilateral oblique pelvic arteriography obtained. This verified patency of  the common iliac, external, and  hypogastric arteries bilaterally. There was  normal flow to the common femoral artery bilaterally.   There were no apparent complications. The guidewire reinserted. Pigtail  catheter removed.   An oblique right retrograde right femoral arteriogram obtained. This  revealed the sheath to be in good position in the common femoral artery.  Angio-Seal closure of right common femoral artery then carried out without  incident.   The patient tolerated the procedure well. No apparent complications.   FINAL IMPRESSION:  1.  Single widely patent bilateral renal arteries.  2.  6 cm infrarenal abdominal aortic aneurysm.  3.  Ectasia of right common iliac artery.   DISPOSITION:  These results have been reviewed with the patient and family.  Due to the patient's age  it was recommended he undergo conventional open  operative repair for his AAA. He is agreeable to this. This will be  scheduled for the near future. He has been cleared from a cardiac point of  view.      PGH/MEDQ  D:  09/03/2004  T:  09/03/2004  Job:  [id number]   cc:   Dr. Sherre Lain [facility], [place]

## 2010-11-05 NOTE — Op Note (Signed)
[place]. [facility]  Patient:    Clifford Jones, Clifford Jones Visit Number: [id number] MRN: [id number]          Service Type: MED Location: 2300 2312 01 Attending Physician:  Charlett Lango Dictated by:   Salvatore Decent. Dorris Fetch, M.D. Proc. Date: 08/17/01 Admit Date:  08/13/2001   CC:         Madolyn Frieze. Jens Som, M.D. [facility]  [address] [place], [place]   Operative Report  PREOPERATIVE DIAGNOSIS:  Three-vessel coronary disease status post myocardial infarction.  POSTOPERATIVE DIAGNOSIS:  Three-vessel coronary disease status post myocardial infarction.  PROCEDURE:  Median sternotomy, extracorporeal circulation, coronary artery bypass grafting x 6 (left internal mammary artery to left anterior descending, right internal mammary artery to distal right coronary, saphenous vein graft to obtuse marginal, saphenous vein graft to ramus intermedius, saphenous vein graft sequentially to the first and third branches of the diagonal).  SURGEON:  Salvatore Decent. Dorris Fetch, M.D.  ASSISTANT:  Maxwell Marion, RNFA  ANESTHESIA:  General.  FINDINGS:  Diagonals, ramus - poor quality, OM - fair quality, LAD and distal right coronary - fair quality.  Good quality conduits.  Right upper lobe atelectasis postbypass necessitating bronchoscopy, which revealed thick, clear secretions and mucus plugging of the right upper lobe bronchus.  CLINICAL NOTE:  Mr. Servantes is a 60 year old with a history of tobacco abuse and coronary disease.  He had a MI nine years ago.  He presented with a nonQ-wave MI.  He underwent cardiac catheterization, which revealed severe 3-vessel coronary disease and was referred for coronary artery bypass grafting.  The indications, risks, benefits, and alternative procedures were discussed in detail with the patient.  He understood and accepted the risks of the procedure and agreed to proceed.  DESCRIPTION OF PROCEDURE:  Mr. Spisak was brought to the preoperative  holding area on August 17, 2001.  Lines were placed to monitor arterial, central venous and pulmonary arterial pressure.  EKG leads were placed for continuous telemetry.  The patient was taken to the operating room, anesthetized and intubated.  A Foley catheter was placed, intravenous antibiotics were administered.  The chest, abdomen and legs were prepped and draped in the usual fashion.  A median sternotomy was performed and simultaneously, an incision was made in the medial aspect of the right leg and the greater saphenous vein was harvested from the ankle to the lower thigh.  The left internal mammary artery was harvested in the standard fashion.  The patient was given 5000 units prior to dividing the distal end of the left mammary artery.  Of note, there was some lung hyperinflation and some emphysematous change in the lungs.  There was excellent flow through the cut end of the left mammary artery.  Next, the right internal mammary artery was harvested in the same fashion as with the left, it was a good quality vessel.  The remainder of the full heparin dose was given prior to dividing the distal end of the right mammary artery.  There was excellent flow through the right mammary artery as well. It also was placed in a papaverine-soaked sponge.  The pericardium was opened.  The ascending aorta was inspected and palpated. There was no palpable atherosclerotic disease.  The aorta was cannulated via concentric 2-0 Ethibond nonpledgeted purse-string sutures.  A dual stage venous cannula was placed via purse-string suture in the right atrium. Cardiopulmonary bypass was instituted and the patient was cooled to 32 degrees Celcius.  The coronary arteries were inspected and anastomotic sites  were chosen.  Note, the right mammary did reach the distal right coronary as a pedicle graft without tension.  Inspection of the coronaries revealed diffusely diseased vessels.  The ramus intermedius  was intramyocardial and only one branch of the ramus could be identified.  The conduits were inspected and cut to length.  A foam pad was placed in the pericardium to protect the left phrenic nerve.  A temperature probe was placed in the myocardial septum and a cardioplegia cannula was placed in the ascending aorta.  The aorta was cross-clamped.  The left ventricle was emptied via the aortic root vent.  Cardiac arrest then was achieved with a combination of cold antegrade blood cardioplegia and topical ice saline.  After achieving a complete diastolic arrest and a myocardial septal temperature of 9 degrees Celcius with 600 cc of blood cardioplegia, the following distal anastomoses were performed:  First, a reverse saphenous vein graft was placed end-to-side to the first obtuse marginal branch of the left circumflex coronary artery.  OM-1 was a 1.5 mm fair quality vessel. The vein graft was of good quality.  The anastomosis was performed end-to-side with a running 7-0 Prolene suture.  At the completion of the anastomosis, cardioplegia was administered.  There was some resistance to flow.  A small venotomy was made in the vein and a 1.5 mm probe was passed proximally and distally through the anastomosis without difficulty.  The small venotomy was repaired with a 7-0 Prolene suture.  Next, a reversed saphenous vein graft was placed end-to-side to the ramus intermedius.  The ramus intermedius was a 1 mm intramyocardial poor quality vessel.  The vein graft was of good quality.  The anastomosis was performed end-to-side with a running 7-0 Prolene suture.  Additional cardioplegia was administered down both vein grafts, as well as the aortic root.  Next, a reversed saphenous vein graft was placed sequentially to the first and third branches of the diagonal.  There was a large diagonal branch, which was diseased, and then gave off three branches.  The first and third branches were  the largest  of these.  The side-to-side anastomosis was performed to the first branch and an end-to-side to the third branch.  The vein graft was of good quality.  Both branches of the diagonal were 1 mm poor quality vessels.  There was good flow through the graft.  Cardioplegia was administered.  There was good hemostasis at both of these anastomoses.  Next, a window was made in the pericardium and the right internal mammary artery was brought through that, the distal end was spatulated, it was anastomosed end-to-side to the distal right coronary.  The arteriotomy was carried onto the proximal portion of the posterior descending.  A 1.5 mm probe passed easily into the posterior descending, as well as into the continuation of the right coronary beyond the bifurcation.  The mammary was of good quality.  The anastomosis was performed with a running 8-0 Prolene suture.  At the completion of the anastomosis, the bulldog clamp was briefly removed to inspect for hemostasis.  The mammary pedicle was tacked to the epicardial surface of the heart with 6-0 Prolene sutures.  Next, the left internal mammary artery was brought through a window in the pericardium.  The distal end of it was spatulated.  It was anastomosed end-to-side to the distal LAD.  The distal LAD was a 1.5 mm fair quality target.  The mammary was 2 mm good quality conduit.  The anastomosis was performed  end-to-side with a running 8-0 Prolene suture.  At the completion of the mammary to LAD anastomosis, the bulldog clamp was removed from the mammary artery and immediate and rapid septal rewarming was noted.  Lidocaine was administered.  The mammary pedicle was tacked to the epicardial surface of the heart with 6-0 Prolene sutures.  The aortic cross-clamp was removed.  Total cross-clamp time was 91 minutes.  The patient resumed a spontaneous cardiac rhythm and did not require defibrillation.  A partial occlusion clamp was placed on the  ascending aorta. The cardioplegia cannula was removed.  The proximal vein graft anastomoses were performed 4.0 mm punch aortotomies with running 6-0 Prolene sutures.  At the completion of the final proximal anastomosis, the patient was placed in Trendelenburg position and air was allowed to vent as the partial clamp was removed.  Air was aspirated from each of the vein grafts, the bulldog clamps were removed, and flow was restored.   All proximal and distal anastomoses were inspected for hemostasis.  Epicardial pacing wires were placed on the right ventricle and right atrium.  When the patient had been rewarmed to 37 degrees Celcius, the lungs were ventilated and the patient was weaned from cardiopulmonary bypass with hand bagging of the lungs.  There was right upper lobe atelectasis.  This persisted throughout the remainder of the case and did not respond to hand bagging or mechanical ventilation.  The total bypass time was 168 minutes.  The initial cardiac index after coming off bypass was greater than 2 l/min/sq.m  A test dose of protamine was administered and was well-tolerated.  The atrial and aortic cannulae were removed.  The remainder of the protamine was administered without incident.  The chest was irrigated with 1 L of warm normal saline containing 1 g of vancomycin.  Hemostasis was achieved.  The pericardium would not come completely together, but the mediastinal fat pad was used to cover the heart and grafts to protect from adhesions to the sternum.  Bilateral pleural tubes were placed and two mediastinal tubes were placed.  The chest was irrigated with 1 l of warm normal saline containing 1 g vancomycin.  Hemostasis was achieved.  The sternum was closed with heavy gauge interrupted stainless steel wires.  The pectoralis fascia was closed a running #1 Vicryl suture.  Subcutaneous tissue and skin were closed in a standard fashion in both the chest and the leg with subcuticular  skin closures.  At the completion of the closure, the patient remained hemodynamically stable. His saturations remained somewhat low, but were improving.  Flexible fiberoptic bronchoscopy was performed via the endotracheal tube.  There was extensive clear secretions with plugging of the right upper lobe bronchus. There were no purulent secretions and there were no endobronchial lesions noted.  Bronchoalveolar lavage was performed to clear the secretions as much as possible.  The patient then was taken from the operating room to the surgical intensive care unit intubated and in stable condition. Dictated by:   Salvatore Decent Dorris Fetch, M.D. Attending Physician:  Charlett Lango DD:  08/17/01 TD:  08/17/01 Job: [id number]

## 2010-11-05 NOTE — Cardiovascular Report (Signed)
NAME:  IZEAR, PINE NO.:  [id number]   MEDICAL RECORD NO.:  [id number]          PATIENT TYPE:  OIB   LOCATION:  6501                         FACILITY:  [facility]   PHYSICIAN:  Arvilla Meres, M.D. LHCDATE OF BIRTH:  09-Feb-1951   DATE OF PROCEDURE:  DATE OF DISCHARGE:  08/27/2004                              CARDIAC CATHETERIZATION   ADDENDUM TO CATHETERIZATION REPORT:  Left ventriculogram showed an EF of 45-  50% with inferior basilar hypokinesis.      DB/MEDQ  D:  08/27/2004  T:  08/28/2004  Job:  [id number]

## 2010-11-05 NOTE — Cardiovascular Report (Signed)
NAME:  Clifford, Jones NO.:  [id number]   MEDICAL RECORD NO.:  [id number]          PATIENT TYPE:  OIB   LOCATION:  6501                         FACILITY:  [facility]   PHYSICIAN:  Arvilla Meres, M.D. LHCDATE OF BIRTH:  1950/09/06   DATE OF PROCEDURE:  08/27/2004  DATE OF DISCHARGE:  08/27/2004                              CARDIAC CATHETERIZATION   PRIMARY CARE PHYSICIAN:  Dr. Roxine Caddy in [place].   CARDIOLOGIST:  Learta Codding, M.D. [facility]   VASCULAR SURGEON:  Balinda Quails, M.D.   PATIENT IDENTIFICATION:  Clifford Jones is a very pleasant 60 year old male with  history of coronary artery disease status post coronary artery bypass graft  who had a positive stress test during a preop workup for abdominal aortic  aneurysm repair.  Thus, he was referred for diagnostic cardiac  catheterization.   PROCEDURE PERFORMED:  1.  Selective coronary angiography.  2.  LIMA/RIMA angiography.  3.  Left heart catheterization.  4.  Left ventriculogram.   CATHETERS/ACCESS:  4 French RFA using a JL-5, JR-4, and an IMA catheter.   DESCRIPTION OF PROCEDURE:  The risks and benefits of the procedure were  explained to Mr. Crites.  Consent was signed and placed on the chart.  The  right groin was prepped and draped in routine sterile fashion and then was  anesthetized with 1% local lidocaine.  A 4 French femoral arterial sheath  was placed in  the right femoral artery using a modified Seldinger  technique.  Standard preformed Judkins catheters and an IMA catheter were  used for the procedure.  All catheters exchanges were made over a wire.  The  left coronary system was imaged using a standard JL-5 catheter.  The right  coronary artery and saphenous vein grafts were imaged with a JR-4 and the  RIMA and LIMA were imaged using an IMA catheter which was advanced over an  exchange wire.  There were no apparent complications.  After the procedure,  the patient was taken back to the holding  area for removal of access.   Of note, according to the operative report there were three vein grafts  placed as well as two IMA grafts.  There was a RIMA to the RCA, a LIMA to  the LAD, saphenous vein graft to a diagonal which had a jump from one  diagonal to another.  There was also note of a saphenous vein graft to an OM  as well as saphenous vein graft to a ramus.  During the catheterization,  there was only evidence of two saphenous vein graft ring markers; one to the  diagonal which had no evidence of a jump, and one to the OM.  There was no  ring for a vein graft to the ramus and the native ramus seemed patent.  There was also no evidence of shadowing of the graft on injection of any  other vessels.   FINDINGS:   HEMODYNAMICS:  Central aortic pressure 151/89 with mean of 114.  LV pressure  158/0 with EDP of 15.   CORONARY ANATOMY:  1.  Left  main had some minor irregularities.  2.  LAD:  Totally occluded in the mid section after first diagonal.  3.  Left circumflex gave off a ramus vessel, small OM-1 and OM-2.  The ramus      was a small 1.0 caliber vessel which was patent.  The OM-1 was totally      occluded proximally.  4.  RCA was  totally occluded proximally.  The LIMA to the LAD was widely      patent with good flow into the distal LAD.  5.  The RIMA to the distal coronary artery was widely patent.  There was      some distal disease in the right system, but non flow limiting.  6.  The saphenous vein graft to the first diagonal had a 40% narrowing      tubular in the ostial portion, but was not flow limiting. The native      diagonal was a small vessel.  7.  The saphenous vein graft to the second  marginal had a 30% proximal      lesion.  However, was otherwise widely patent.  There was 90% stenosis      in the native OM just distal to the insertion of the vein graft.   As above, there was no evidence of saphenous vein graft to the ramus which  was dictated previously.  There was no ring marker and no shadowing on any  injection.   ASSESSMENT/PLAN:  1.  Severe native coronary artery disease with total right coronary artery      and left anterior descending as well as totalled obtuse marginal two.  2.  Left internal mammary artery to the left anterior descending is patent.  3.  Right internal mammary artery to the right coronary artery is patent.  4.  Saphenous vein graft to the diagonal there was no evidence of a jump      graft as described in the operative report.  The vein graft was patent      with a 40% tubular lesion in the ostial portion of the graft.  The      native diagonal was small.  5.  The saphenous vein graft to the obtuse marginal two had a 30% ostial      lesion in the graft with 90% lesion in 2.0 mm obtuse marginal two after      the insertion of the graft.  6.  Saphenous vein graft as above.  No evidence of this graft as described      on the operative report.   CONCLUSION:  The case was reviewed with Dr. Andee Lineman and Dr. Gerri Spore.  The  native OM will likely to be amenable to PCI via the saphenous vein graft.  This is likely  now large enough territory to affect his outcome.  We  feel he is stable to proceed with abdominal aortic aneurysm without any  further cardiovascular workup.  I would strongly recommend continuation of  his beta blocker and nitroglycerin through the perioperative and  postoperative period.      DB/MEDQ  D:  08/27/2004  T:  08/27/2004  Job:  [id number]   cc:   Cipriano Mile, M.D. [facility]   Demetrius Charity Liliane Bade, M.D.  [address]  [place]  [place] [address]

## 2010-11-05 NOTE — H&P (Signed)
[place]. [facility]  Patient:    Clifford Jones, DECANDIA Visit Number: [id number] MRN: [id number]          Service Type: MED Location: 2000 2015 01 Attending Physician:  Junious Silk Dictated by:   Madolyn Frieze. Jens Som, M.D. [facility] Admit Date:  08/13/2001                           History and Physical  HISTORY OF PRESENT ILLNESS: [name] is a 60 year old male, with a past medical history of coronary artery disease.  He is status post myocardial infarction and percutaneous coronary intervention approximately nine to ten years ago at [facility] [employer].  Those records are not available.  He also has hyperlipidemia and a history of alcohol use.  The patient has had no symptoms since his previous myocardial infarction until 1 a.m. today when he developed a pain in the left breast area.  There was also radiation down his left upper extremity.  There was no associated nausea, vomiting, or shortness of breath, but there was mild diaphoresis.  The pain was not pleuritic or positional.  It lasted for approximately four hours and then resolved.  He was later working and developed recurrent pain and went to [facility].  He ruled in for myocardial infarction, and was transferred to [facility]. [facility] for further management.  Of note, he is presently pain-free. He denies any orthopnea, PND, or pedal edema.  There has been no syncope.  PAST MEDICAL HISTORY:  1. Significant for hyperlipidemia, but he denies any hypertension or diabetes     mellitus.  2. History of coronary artery disease as outlined above.  3. History of appendectomy.  There is no other past medical history by report.  CURRENT MEDICATIONS: Aspirin q.d.  ALLERGIES: No known drug allergies.  SOCIAL HISTORY: He does smoke and he also consumed alcohol (approximately two six-packs per week as well as a pint of EtOH).  FAMILY HISTORY: Positive for coronary artery  disease.  REVIEW OF SYSTEMS: He denies any headaches, fever, or chills.  There is no productive cough or hemoptysis.  There is no dysphagia, odynophagia, melena, or hematochezia.  He has no dysuria or hematuria.  There is no rash or history of seizure activity.  There is no orthopnea, PND, or pedal edema.  There is no claudication noted.  The remaining systems are negative.  PHYSICAL EXAMINATION:  VITAL SIGNS: Blood pressure 140/80, pulse 84.  GENERAL: He is well-developed, well-nourished and in no acute distress.  SKIN: Warm and dry.  HEENT: Unremarkable.  Normal eyelids.  NECK: Supple with normal upstroke bilaterally.  No bruits noted.  No jugular venous distention, no thyromegaly noted.  CHEST: Clear to auscultation with normal expansion.  CARDIOVASCULAR: Regular rate and rhythm with normal S1 and S2.  No murmurs, rubs, or gallops noted.  ABDOMEN: Nontender, nondistended.  Positive bowel sounds.  No hepatosplenomegaly.  No masses appreciated.  No abdominal bruits.  Femoral pulses 2+ bilaterally, no bruits.  EXTREMITIES: No edema.  I can palpate no cords.  He has 2+ dorsalis pedis pulses bilaterally.  NEUROLOGIC: Grossly intact.  LABORATORY DATA: Electrocardiogram from [facility] shows normal sinus rhythm and a rate of 71, axis normal, nonspecific ST changes.  Chest x-ray shows mild COPD but it is over-penetrated.  INR is 1.1.  CK is 362 with MB of 60.  Troponin I is 2.46.  WBC 10.8, hemoglobin 16.5, hematocrit  47.4; platelet count 300,000.  Sodium 136, potassium 4, BUN and creatinine 15 and 0.9, glucose 109.  DIAGNOSES:  1. Subendocardial myocardial infarction.  2. History of coronary artery disease, status post percutaneous coronary     intervention of unknown vessel.  3. History of hyperlipidemia.  4. Alcohol use.  5. Tobacco abuse.  PLAN: [name] presents with a subendocardial myocardial infarction.  He is presently pain-free.  We will treat with  aspirin, IV heparin, IV Integrelin, and IV nitroglycerin.  We will also add Lopressor 12.5 mg p.o. b.i.d.  Given the recent heart protection data from [place] we will add Pravachol 40 mg p.o. q.d. for its mortality benefit.  We will also check a fasting cholesterol panel.  I have discussed the risks and benefits of cardiac catheterization with Mr. Clabo and he agrees to proceed.  I have also discussed the importance of discontinuing his tobacco and alcohol use with him.  We will need to follow him for the possibility of withdrawal. Dictated by:   Madolyn Frieze. Jens Som, M.D. [facility] Attending Physician:  Junious Silk DD:  08/13/01 TD:  08/14/01 Job: [id number]

## 2010-11-05 NOTE — Discharge Summary (Signed)
[place]  Patient:    Clifford Jones, Clifford Jones Visit Number: [id number] MRN: [id number]          Service Type: MED Location: 2000 2032 [id number] Attending Physician:  Charlett Lango Dictated by:   Sherrie George, P.A. Admit Date:  08/13/2001 Disc. Date: 08/21/01   CC:         [facility], [place], [place]  Amy Luisa Hart, P.A.-C., [facility], [place], [place]   Referring Physician Discharge [place]  DATE OF BIRTH:  06/26/1950  ADMITTING DIAGNOSES: 1. Acute subendocardial myocardial infarction with history of prior myocardial    infarction in 1993 and subsequent cath/PTCA. 2. Hyperlipidemia. 3. History of 60-pack-year history of tobacco use. 4. History of alcohol abuse.  DISCHARGE DIAGNOSES: 1. Acute subendocardial myocardial infarction with a 50-70% left main    stenosis, 60% and 90% diagonal stenosis, 95% of the circumflex stenosis,    and 100% occlusion of the right coronary artery.  Ejection fraction 64%. 2. Status post myocardial infarction/PTCA in 1993. 3. Chronic obstructive pulmonary disease with 60-pack-year history of tobacco    use. 4. History of alcohol abuse. 5. Hyperlipidemia.  PROCEDURES: 1. Cardiac catheterization, August 15, 2001. 2. Coronary artery bypass grafting x 6 with the left internal mammary to the    LAD, saphenous vein graft to the first and third diagonals sequentially,    saphenous vein graft to the ramus, saphenous vein graft to the circumflex,    right internal mammary artery to the distal right coronary artery. 3. Flexible bronchoscopy for mucus plugging.  BRIEF HISTORY:  The patient is a 60 year old white male, admitted by Dr. Olga Millers, after referral from the emergency department at [facility].  The patient presented with chest pain earlier in the day which radiated down to his left upper extremity, and there was no associated nausea or vomiting, but there was mild diaphoresis  that lasted for approximately four hours and then resolved.  He was later working and developed recurrent pain and went to [facility].  There, he was evaluated and was found to have a subendocardial myocardial infarction and was transferred to [facility] for further management and treatment.  He status post myocardial infarction and PTCA intervention approximately 9-10 years ago at [facility].  Those records have not been obtained.  The patient has had no symptoms since his previous MI until the day of admission.  PAST MEDICAL HISTORY: 1. Hyperlipidemia, but he has not had a history of hypertension or diabetes. 2. Coronary artery disease. 3. Appendectomy.  MEDICATIONS ON ADMISSION:  Aspirin 1 q.d.  ALLERGIES:  None known.  HABITS:  He smokes approximately 1-2 packs per day and has for at least the last 30 years.  He also consumes two six-packs of beer per week as well as a pint of liquor weekly.  For further history and physical, please see the dictated note.  HOSPITAL COURSE:  The patient was admitted.  At the time of admission, he was pain free.  He was treated with aspirin, IV heparin, and IV Integrilin along with IV nitroglycerin.  He was started on a beta blocker and placed in the ICU.  He stabilized and was subsequently taken to the cardiac catheterization lab on August 15, 2001.  The patients cath results showed a 50-70% left main.  The LAD had a proximal 50% distal, a 20% stenosis.  The second diagonal had a 95% stenosis that was of small caliber.  The left circumflex showed  a 50% and 95% stenosis.  The ramus intermedius had a 95% stenosis and was of small caliber.  The RCA was totally occluded proximally.  His ejection fraction was calculated at 64%.  He was seen in consultation by Dr. Charlett Lango on August 15, 2001.  He agreed he was good candidate for coronary artery bypass grafting, and that this was his best option.  The risks and  benefits were discussed in detail, and informed operative consent was obtained.  He was subsequently taken to the operating room on August 17, 2001, at which time he underwent the procedure as described above.  He tolerated the procedure well, and he returned to the intensive care unit in satisfactory condition.  Cardiac index was 2.36 postoperatively with O2 saturations at 97% on 4 liter nasal cannula.  He remained hemodynamically stable throughout the first postoperative day and was transferred to the floor.  The second postoperative day, he was started on phase 1 cardiac rehab and has made quite good progress over the next 48 hours.  His wounds are healing nicely.  He has done well overall.  Telemetry showed sinus rhythm with no arrhythmias.  He is overall doing well.  He is eating well, and if he continues to do well and has no further problems, it is anticipated he will be ready for discharge on August 21, 2001 or August 22, 2001.  We plan on removing his pacing wires today.  DISCHARGE MEDICATIONS: 1. Lopressor 50 mg 1/2 tab p.o. b.i.d. 2. Altace 2.5 mg p.o. q.d. 3. Lipitor 10 mg p.o. h.s. 4. Ativan for cigarette cessation, and he has had instruction about cigarette    cessation. 5. Coated aspirin 325 mg 1 q.d. 6. Lasix 40 mg q.d. 7. Potassium chloride 20 mEq q.d. 8. Tylox 1-2 p.o. q.4h. p.r.n.  FOLLOW-UP:  He is scheduled to return to see Dr. Dorris Fetch in three weeks on Wednesday, September 12, 2001, at 9:45 a.m.  Follow-up with [facility] will be arranged prior to discharge.  DISCHARGE ACTIVITY:  Light to moderate.  No lifting over 10 pounds, driving, or strenuous activity.  DISCHARGE INSTRUCTIONS: 1. He is to clean his incisions with plain soap and water. 2. Call for any problems with redness or drainage.  LABORATORY DATA:  On August 19, 2001, shows electrolytes to be normal.  BUN 21, creatinine 1.1, white count 9.5, hemoglobin 9.8, hematocrit 29.5,  platelets 143,000.   CONDITION ON DISCHARGE:  Improving. Dictated by:   Sherrie George, P.A. Attending Physician:  Charlett Lango DD:  08/20/01 TD:  08/20/01 Job: [id number]

## 2010-11-05 NOTE — Discharge Summary (Signed)
NAME:  Clifford Jones, Clifford Jones NO.:  [id number]   MEDICAL RECORD NO.:  [id number]          PATIENT TYPE:  INP   LOCATION:  2029                         FACILITY:  [facility]   PHYSICIAN:  Theda Belfast, PA DATE OF BIRTH:  1951/06/20   DATE OF ADMISSION:  09/14/2004  DATE OF DISCHARGE:  09/19/2004                                 DISCHARGE SUMMARY   PRIMARY DIAGNOSIS:  1.  6.2 cm abdominal aortic aneurysm.  2.  Acute blood loss anemia.   SECONDARY DIAGNOSIS:  1.  Coronary artery disease status post multiple myocardial infarctions      status post balloon angioplasty status post coronary artery bypass      grafting.  2.  Abdominal aortic aneurysm.  3.  Hyperlipidemia.  4.  Chronic obstructive pulmonary disease.  5.  Tobacco abuse.   ALLERGIES:  No known drug allergies.   OPERATIONS AND PROCEDURES:  Repair of infrarenal abdominal aortic aneurysm  and right common iliac artery aneurysm with a 16 by 8 bifurcated aorto-bi-  iliac graft.   HISTORY:  Clifford Jones is a 60 year old Caucasian male with a known abdominal  aortic aneurysm who has been followed by Dr. Madilyn Fireman since 2005.  He was  initially followed by serial CT scans, however, the most recent CT scan  revealed an increase of the aneurysm to 6.2 cm in maximal diameter.  There  is an infrarenal neck of 3.1 cm as well as some enlargement of the right  common iliac artery measuring 2.4 cm.  The left common iliac artery is  normal.  The patient underwent cardiac clearance by [facility] for  surgery as it is Dr. Madilyn Fireman opinion that the patient should proceed with  resection and grafting of the abdominal aortic aneurysm.  The patient does  complain of abdominal pain and back pain as well as reflex symptoms and  dysphagia type symptoms with every meal that has been present for several  years.  He also complains of right hip and buttock pain and stiffness as  constant and is not associated with exercise or exertion.   The patient  denies shortness of breath, dyspnea on exertion, nausea and vomiting,  constipation, diarrhea, peripheral edema, dysuria, hematuria, claudication  symptoms, angina, and TIA/CVA symptoms.  The patient is seen and evaluated  by Dr. Madilyn Fireman.  Dr. Madilyn Fireman has discussed with the patient undergoing abdominal  aortic aneurysm repair.  He has discussed the risks and benefits of this  procedure, the patient acknowledges understanding and wishes to proceed.  Surgery was scheduled for September 14, 2004.  For details of the patient's past  medical history and physical exam, please see dictated history and physical.   HOSPITAL COURSE:  Clifford Jones was taken to the operating room on September 14, 2004, where he underwent repair of infrarenal abdominal aortic aneurysm and  right common iliac artery aneurysm with 16 by 8 bifurcated aorto-bi-iliac  graft.  The patient tolerated the procedure well and was transferred out to  the intensive care unit in stable condition.  Following surgery, the patient  was alert and  oriented x 4.  He had 2+ bilateral dorsalis pedal pulses  noted.  Postoperative day one, the patient was seen to be hemodynamically  stable.  Hematocrit was 32%.  He was afebrile.  Creatinine was stable at  0.9.  Abdomen was soft to palpation.  The patient has pedal pulses noted.  He is out of bed and ambulating.  The patient was transferred to 2000 on  postoperative day one.  Postoperative day two, the patient continued to  progress.  He did have a low grade temperature of 101.3 which was most  likely secondary to atelectasis.  The patient's white blood cell count was  normal at 11.  Temperature will be monitored.  He remained hemodynamically  stable.  No bowel sounds as of yet but abdomen was soft to palpation.  His  incisions were dry, intact, and healing well.  He remains with 2+ bilateral  posterior tibial pulses.  The patient was started on ice chips.  On  postoperative day three, the  patient continued to progress well.  He was  afebrile and stable.  A few bowel sounds were heard, positive flatus, no  bowel movement.  Diet was advanced to clear liquids.  Postoperative day  four, the patient was tolerating clear liquid diet without complaints.  No  nausea and vomiting.  The patient did have bowel movement on postoperative  day four.  He was out of bed and ambulating well.  His incision was dry and  intact and healing well.  He remained with 2+ bilateral posterior tibial  pulses.  The patient's diet was advanced to regular.  On postoperative day  five, the patient was tolerating a regular diet well without nausea and  vomiting.  He was stable and afebrile.  Saturation 93% on room air.  Abdomen  soft.  Incision dry, intact, and healing well.  The patient is discharged to  home on postoperative day five in stable condition.   DISCHARGE INSTRUCTIONS:  A follow up appointment was scheduled with Dr.  Madilyn Fireman for October 11, 2004, at 1:50 p.m.  The patient will have staples  removed September 28, 2004, at 10 a.m. by RN.  The patient received instructions  on diet, activity, incisional care.  He was told no driving until released  to do so, no heavy lifting over 10 pounds.  He is allowed to shower washing  his incision using soap and water.  He is to contact the office if he  develops any drainage or opening from his incision.  The patient  acknowledged understanding.  He was, again, encouraged to quit smoking.  Diet low fat, low salt.  The patient is instructed to ambulate 3-4 times per  day and continue doing his breathing exercises.  The patient acknowledged  understanding.   DISCHARGE MEDICATIONS:  1.  Metoprolol 25 mg p.o. t.i.d.  2.  Aspirin 325 mg p.o. daily.  3.  Ferrous sulfate 325 mg p.o. t.i.d.       KMD/MEDQ  D:  12/01/2004  T:  12/01/2004  Job:  [id number]

## 2010-11-05 NOTE — Op Note (Signed)
NAME:  Clifford Jones NO.:  [id number]   MEDICAL RECORD NO.:  [id number]          PATIENT TYPE:  INP   LOCATION:  2304                         FACILITY:  [facility]   PHYSICIAN:  Balinda Quails, M.D.    DATE OF BIRTH:  1950-09-17   DATE OF PROCEDURE:  09/14/2004  DATE OF DISCHARGE:                                 OPERATIVE REPORT   ASSISTANTS:  Di Kindle. Edilia Bo, M.D.; Pecola Leisure, [place]   ANESTHESIA:  General endotracheal.   ANESTHESIOLOGIST:  Guadalupe Maple, M.D.   PREOPERATIVE DIAGNOSIS:  A 6.2-cm abdominal aortic aneurysm.   POSTOPERATIVE DIAGNOSIS:  A 6.2-cm abdominal aortic aneurysm.   PROCEDURE:  Repair of infrarenal abdominal aortic aneurysm and right common  iliac artery aneurysm with 16 x 8 bifurcated aortobiiliac graft.   CLINICAL NOTE:  [name] is a 60 year old male with a history of  coronary artery disease and known abdominal aortic aneurysm.  Recent  evaluation revealed enlargement of his aneurysm now measuring 6.2 cm in  diameter.  He also has enlargement of right common iliac artery measuring  2.4 cm in diameter.  Preoperative workup completed.  He is scheduled for  elective repair of his aneurysm today.   PROCEDURE NOTE:  The patient was brought to the operating room in stable  condition.  He was placed in the supine position.  General endotracheal  anesthesia induced.  In the supine position, the abdomen and both legs were  prepped and draped in a sterile fashion.  Foley catheter, arterial lines,  Swan-Ganz catheter in place.   A midline skin incision was made from xiphoid to pubis.  Incision extended  easily through the subcutaneous tissue with electrocautery.  Linea alba  incised.  Peritoneal cavity entered without difficulty.  Full laparotomy  evaluation carried out.  The gallbladder could not be identified.  There  were a number of adhesions in the right upper quadrant.  The patient was  status post appendectomy.   The  stomach and duodenum were normal.  Liver revealed no masses.  Small  bowel unremarkable.  Large bowel revealed no masses.   There was a large infrarenal abdominal aortic aneurysm present.  There was  an infrarenal neck approximately 2-3 cm.  The right common iliac artery was  enlarged consistent with a small aneurysm.   Small bowel retracted to the right, and the transverse colon brought  superiorly.  Retroperitoneum incised over the aneurysm sac.  Dissection was  carried proximally up to the neck of the aneurysm.  The origin of the renal  arteries bilaterally identified.  The left renal vein skeletonized and  retracted.  The aorta was cleared adequately for an infrarenal clamp  placement.   Distal dissection then carried down the aneurysm sac.  The inferior  mesenteric artery origin was identified and encircled with a vessel loop.  The retroperitoneum incised down to the aortic bifurcation.  The right  common iliac artery then exposed down to the origin of the hypogastric and  external iliac which were encircled with vessel loops.  Right ureter  reflected inferiorly and  preserved.   The left common iliac artery origin was identified.  This was soft with mild  plaque disease and no significant aneurysmal involvement and encircled with  the vessel loop.   The patient administered 25 g Mannitol intravenously along with 7000 units  heparin intravenously.  Adequate circulation time permitted.   The infrarenal aorta controlled with an aortic DeBakey clamp.  The iliac  vessels controlled bilaterally with coarctation clamps.  Aneurysm sac then  opened longitudinally.  There was brisk back-bleeding from the inferior  mesenteric artery and this was ligated with 2-0 silk.  Backbleeding from the  lumbar vessels was controlled with figure-of-eight 2-0 silk suture.  Aneurysm sac opened up to the neck where the aorta was T'd.  A 16 x 8  bifurcated Hemashield graft was anastomosed to the  infrarenal aorta using  running 3-0 Prolene suture.  At completion of the proximal anastomosis, the  graft was flushed and each limb of the graft controlled with a Fogarty  clamp.   The left limb of the graft then brought down to the left common iliac  artery.  The left common iliac artery divided transversely.  The left limb  of the graft divided.  The anastomosed end down to the left common iliac  artery using running 5-0 Prolene suture.  At completion of this, clamps were  removed with excellent flow present.  Left leg re-perfused without  difficulty.   Right limb of the graft then brought down to the right common iliac  bifurcation.  The right common iliac artery divided just proximal to its  bifurcation.  The right limb of the graft divided and anastomosed end down  to the right common iliac bifurcation using running 4-0 Prolene suture.  At  completion of the anastomosis, adequate flushing carried out.  Clamps were  removed.  Excellent flow present.   The patient administered 50 mg Protamine intravenously.  Adequate hemostasis  obtained.  Sponge and instrument counts were correct.  The aneurysm sac was  closed over the grafts using running 2-0 Vicryl suture.  The retroperitoneum  reapproximated using running 3-0 Vicryl suture.   The abdomen examined to ensure there are no retained instruments or sponges.  The midline fascia then closed with running #1 PDS suture.  The subcutaneous  tissue loosely reapproximated with interrupted 3-0 Vicryl suture.  Staples  applied to the skin.  Sterile dressing applied.   The patient was extubated in the operating room.  There were no apparent  complications.  Transferred to the recovery room in stable condition.      PGH/MEDQ  D:  09/16/2004  T:  09/16/2004  Job:  [id number]   cc:   Clifford Jones  [address]  [place]  [place] [address]  Fax: [phone]

## 2010-11-05 NOTE — Cardiovascular Report (Signed)
[place]. [facility]  Patient:    Clifford Jones, Clifford Jones Visit Number: [id number] MRN: [id number]          Service Type: MED Location: [phone] Attending Physician:  Junious Silk Dictated by:   Daisey Must, M.D. [facility] Proc. Date: 08/15/01 Admit Date:  08/13/2001   CC:         Amy Johny Sax., Fransico Meadow. Jens Som, M.D. [facility]  Cardiac Catheterization Lab   Cardiac Catheterization  PROCEDURE: 1. Left heart catheterization with coronary angiography, left    ventriculography, and abdominal aortography. 2. Attempt at percutaneous transluminal coronary angioplasty of the right    coronary artery which was unsuccessful due to inability to cross a    chronic total occlusion.  CARDIOLOGIST:  Daisey Must, M.D. [facility]  INDICATION:  Mr. Madole is a 60 year old male with previous myocardial infarction in 1994.  He has undergone PTCA twice of the distal right coronary artery.  He presented two days ago with substernal chest pain and ruled in for a non-Q-wave myocardial infarction.  After stabilization, he was referred for cardiac catheterization.  PROCEDURAL NOTE:  We initially placed a 6-French sheath in the right femoral artery.  Diagnostic catheterization was performed with standard Judkins 6-French catheters.  Contrast was Omnipaque.  After completion of the diagnostic catheterization, the images were reviewed with Dr. Juanda Chance.  The patient had an occlusion of the right coronary artery which proximally appeared to be relatively recent.  There were collaterals filling the distal right coronary artery.  We felt that attempted percutaneous intervention of the right coronary artery would be warranted to see whether or not it could be crossed with a wire.  We, therefore, proceed with attempt at percutaneous intervention.  The patient had already been enrolled in the ZANADU study and was continued on study drug.  We used a 7-French JR4  guiding catheter with side holes.  We advanced a choice PT wire under fluoroscopic guidance into the right coronary artery.  We were able to advance this beyond the occlusion of the proximal vessel into the mid vessel.  It then advanced into the acute marginal branch.  We advanced a 2.0 x 15 over-the-wire Maverick over this wire and then pulled it back.  Contrast was injected through the lumen of the Maverick balloon which confirmed that we were intraluminal; however, the contrast filled only the acute marginal branch and did not go into the distal right coronary artery.  This suggested that there was a second occlusion in the distal right coronary artery after the origin of the acute marginal branch.  We attempted again to cross the second occlusion in the distal right coronary artery with high-torque floppy wire as well as with the Choice PT wire; however, neither of these could cross without much difficulty. At that point because of the length of the occlusion as well as what appeared to be an old chronic occlusion, it was felt that further attempts would not be successful in crossing the distal right coronary artery.  We, therefore, removed the guidewire from the coronary.  Final angiographic images revealed stability of the right coronary artery with persistent occlusion of the proximal vessel with no evidence of contrast extravasation.  COMPLICATIONS:  None.  RESULTS:  HEMODYNAMICS:  Left ventricular pressure 130/22, aortic pressure 128/78. There was no aortic valve gradient.  LEFT VENTRICULOGRAM:  There was mild akinesis of the inferobasilar wall. Otherwise, wall motion is normal.  Ejection fraction is calculated at 64%. There was  no mitral regurgitation.  ABDOMINAL AORTOGRAM:  Abdominal aortogram reveals patent renal arteries. There is a moderately large infrarenal abdominal aortic aneurysm.  Iliac arteries are patent.  CORONARY ANGIOGRAPHY: (Right dominant).  Left main  has a distal 50 to 70% stenosis.  This was a very eccentric lesion.  Left anterior descending artery has a 50% stenosis in the proximal vessel followed by a 20% in the distal vessel.  The LAD gives rise to a small first diagonal and relatively long but small caliber second diagonal.  The second diagonal is diffusely diseased with a 60% stenosis in the proximal vessel and a 90% stenosis in a smaller branch.  There is also a 60% stenosis in a subbranch.  The left circumflex has a 40% stenosis proximally.  The distal circumflex has a 50% followed by a 95% stenosis prior to a third obtuse marginal branch.  The circumflex gives rise to a small to normal size ramus intermediate which has a 95% stenosis at a bifurcation point in the mid vessel.  There is a small first obtuse marginal branch and normal size second obtuse marginal branch arising from the distal circumflex.  The right coronary artery is a dominant vessel.  It is 100% occluded in the proximal vessel.  The distal right coronary artery including normal size posterior descending artery, a normal size posterolateral branch, and a normal size acute marginal branch all fill via left-to-right collaterals; however, the acute marginal and the remainder of the distal right coronary artery appear to fill separately.  IMPRESSIONS: 1. Preserved left ventricular systolic function with mild focal wall motion    abnormalities as described. 2. Moderately large abdominal aortic aneurysm. 3. Three-vessel coronary artery disease with significant small vessel    disease with moderate left main disease, significant small vessel disease,    and a chronic total occlusion of the right coronary artery which could    not be crossed with a guidewire for percutaneous intervention.  PLAN:  An abdominal ultrasound will be obtained to evaluate the size of the  patients abdominal aortic aneurysm.  The patient will be referred for coronary artery bypass  surgery. Dictated by:   Daisey Must, M.D. [facility] Attending Physician:  Junious Silk DD:  08/15/01 TD:  08/15/01 Job: [id number]

## 2010-11-05 NOTE — Consult Note (Signed)
[facility]  Patient:    LASON, EVELAND Visit Number: [id number] MRN: [id number]          Service Type: MED Location: [phone] Attending Physician:  Junious Silk Dictated by:   Salvatore Decent Dorris Fetch, M.D. Proc. Date: 08/15/01 Admit Date:  08/13/2001   CC:         Madolyn Frieze. Jens Som, M.D. [facility]  Amy Johny Sax.   Consultation Report  REASON FOR CONSULTATION:  Three vessel coronary disease status post non Q-wave myocardial infarction.  CHIEF COMPLAINT:  Chest pain.  HISTORY OF PRESENT ILLNESS:  Mr. Marcum is a 60 year old gentleman.  He has a past history of coronary disease status post myocardial infarction nine years ago.  He had angioplasty and at the time of catheterization at that time.  He also has a history of hyperlipidemia and tobacco abuse.  He denies hypertension or diabetes.  He has a strong family history of coronary disease. He had done well from a cardiac standpoint until recently on Sunday night and into Monday morning he developed pain in the left chest area.  There was radiation to his left arm.  There was no nausea, vomiting, or dyspnea but there was some mild diaphoresis.  The pain lasted for several hours.  He later was working on Monday and developed recurrent pain.  He went to [facility] for evaluation.  He ruled in for a myocardial infarction and was transferred to [facility].  He states that on that occasion the pain did not resolve until he was in the emergency room at [facility].  He now states that he has also been feeling generally fatigued, weak, and tired for the past two to three months, but attributed that to working 12 hours a day.  PAST MEDICAL HISTORY:  Significant for coronary artery disease (see above), hyperlipidemia, history of an appendectomy.  Denies hypertension or diabetes. Denies stroke, TIA, DVT, or PE.  MEDICATIONS:  Aspirin daily.  ALLERGIES:  No known drug  allergies.  FAMILY HISTORY:  Significant for coronary artery disease.  His father died at age 57.  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  He smokes.  He also consumes alcohol generally on a daily basis.  He drinks about 12 beers and a pint of liquor per week.  REVIEW OF SYSTEMS:  See HPI.  He denies orthopnea, paroxysmal nocturnal dyspnea, peripheral edema.  There is no stroke or TIA symptoms.  No change in bowel or bladder habits.  No claudication.  All other systems are negative.  PHYSICAL EXAMINATION  GENERAL:  Mr. Bruington is a well appearing 60 year old white male in no acute distress.  He is well-developed and well-nourished.  VITAL SIGNS:  Temperature 97.5, blood pressure 108/70, pulse 72 and regular, respirations 18, oxygen saturation 94% on room air.  NEUROLOGIC:  He is alert and oriented x3 and grossly intact.  HEENT:  Unremarkable.  NECK:  Supple without thyromegaly, adenopathy, or bruits.  CARDIAC:  Regular rate and rhythm.  Normal S1, S2.  No murmur, rub, or gallop.  LUNGS:  Clear to auscultation and percussion with no rales or wheezing.  ABDOMEN:  Soft.  There is a pulsatile palpable aorta in the midline.  It is difficult to ascertain the size.  It is nontender.  EXTREMITIES:  Without clubbing, cyanosis, edema.  He has 2+ radical pulses bilaterally and 2+ dorsalis pedis and posterior tibial pulses bilaterally.  He  has an equivocal Allens test on the left side.  LABORATORY DATA:  Cardiac catheterization revealed severe three vessel disease.  Ejection fraction was preserved at 64%.  He also was noted to have a moderately large abdominal aortic aneurysm with an abdominal aortogram.  Chest x-ray showed no active disease.  CK total 269, MB 37.  LFTs within normal limits.  Troponin 1.6.  Cholesterol 226.  PTT 70 after initiation of heparin.  White count 9.4, hemoglobin 15.7, hematocrit 45.2, platelets 266,000.  He  subsequently had a troponin of 2.29 on August 14, 2001.  IMPRESSION:  Mr. Mcmiller is a 60 year old gentleman with a history of coronary disease.  He has continued to smoke despite having a myocardial infarction nine years ago.  He had been free of cardiac symptoms recently until this past weekend when he developed chest pain and some diaphoresis.  He was admitted on Monday and ruled in for a myocardial infarction.  He has severe three vessel disease by catheterization, but fortunately still has preserved left ventricular function.  He did rule in for a non Q-wave myocardial infarction. He was treated on the Xanadu protocol with [id number] as well as heparin.  The study drug has now been stopped.  There is a recommendation to wait at least 24 hours to allow this to clear the system.  The schedule is already full tomorrow.  The patient will be tentatively scheduled for surgery on Friday morning relative to the acuity of other patients that may present.  Coronary artery bypass grafting is indicated for survival benefit and relief of symptoms.  I agree with the plan for abdominal ultrasound to evaluate the size of the abdominal aortic aneurysm, but will not tackle the abdominal aortic aneurysm until the patient has recovered from his bypass surgery.  If there is a significant aneurysm we will have him seen by vascular surgery while he is in the hospital.  If it turns out to be a small aneurysm we can have him followed up later on as an outpatient.  I discussed in detail with the patient and his girlfriend the indications, risks, benefits, and alternative treatments.  They understand that the risks of the procedure include, but are not limited to, death, stroke, myocardial infarction, deep venous thrombosis, pulmonary embolus, bleeding, possible need for transfusions with increased risk for bleeding secondary to the study drugs, infections, as well as other organ system dysfunction  including  respiratory, renal, hepatic, or gastrointestinal dysfunction as well as a possibility of completely unpredictable complications.  They understand the expected postoperative length of stay as well as the total postoperative recuperation time.  Mr. Blatz will need pre bypass surgery vascular studies including carotid Dopplers and also will need an abdominal ultrasound to evaluate his abdominal aortic aneurysm.  Mr. Trupiano was advised that bypass grafting is a palliative treatment and not curative and the importance of lifestyle modification, particularly smoking cessation was emphasized. Dictated by:   Salvatore Decent Dorris Fetch, M.D. Attending Physician:  Junious Silk DD:  08/15/01 TD:  08/16/01 Job: [id number]

## 2010-11-05 NOTE — H&P (Signed)
NAME:  Clifford Jones, Clifford Jones NO.:  [id number]   MEDICAL RECORD NO.:  [id number]          PATIENT TYPE:  INP   LOCATION:  NA                           FACILITY:  [facility]   PHYSICIAN:  Balinda Quails, M.D.    DATE OF BIRTH:  14-Dec-1950   DATE OF ADMISSION:  09/14/2004  DATE OF DISCHARGE:                                HISTORY & PHYSICAL   CARDIOLOGIST:  Learta Codding, M.D. [facility]  Carole Binning, M.D. [facility]   CHIEF COMPLAINT:  Abdominal aortic aneurysm.   HISTORY OF PRESENT ILLNESS:  This is a 60 year old Caucasian male with a  known abdominal aortic aneurysm that has been followed by Dr. Madilyn Fireman since  2005.  It was initially followed by serial CT scans, however, the most  recent CT scan revealed an increase of the aneurysm to 6.2-cm in maximal  diameter.  There is an infrarenal neck of 3.1-cm as well as some enlargement  of the right common iliac artery measuring 2.4-cm.  The left common iliac  artery is normal.  The patient recently underwent a cardiac clearance by  [facility] for surgery as it is Dr. Madilyn Fireman opinion that the patient  should proceed with resection and grafting of the abdominal aortic aneurysm.  The patient does complain of abdominal pain and back pain as well as reflux  symptoms and dysphagia type symptoms with every meal that has been present  for several years.  He also complains of right hip and buttock pain and  stiffness that is constant and is not associated with exercise or exertion.  The patient denies shortness of breath, dyspnea on exertion, nausea,  vomiting, constipation, diarrhea, peripheral edema, dysuria, hematuria,  claudication symptoms, angina, and TIA/CVA symptoms.  The patient presents  to the CVTS office today for history and physical exam prior to surgery.   PAST MEDICAL HISTORY:  1.  Coronary artery disease, status post multiple myocardial infarctions,      status post balloon angioplasty, status post coronary artery bypass    grafting.  2.  Abdominal aortic aneurysm.  3.  Hyperlipidemia.  4.  COPD.  5.  Tobacco abuse.   PAST SURGICAL HISTORY:  1.  Appendectomy.  2.  Balloon angioplasty in 1984.  3.  Coronary artery bypass grafting x 6 in 2003.   ALLERGIES:  No known drug allergies.   MEDICATIONS:  1.  Metoprolol 25 mg t.i.d.  2.  Isosorbide 30 mg every day.  3.  Aspirin 325 mg every day.   REVIEW OF SYSTEMS:  Please see HPI for significant positives and negatives,  otherwise negative for diabetes mellitus and kidney disease.   SOCIAL HISTORY:  This is a single male with two children.  He lives alone  and currently smokes a half a pack a day.  The patient has smoked for  approximately 40 years.  The patient denies alcohol use and currently works  as a Curator.   FAMILY HISTORY:  Father significant for prostate cancer and heart disease.   PHYSICAL EXAMINATION:  VITAL SIGNS:  Blood pressure 142/92 in the left arm  sitting, heart rate is 76, respirations are 16.  GENERAL:  This is a 59 year old Caucasian male in no acute distress.  HEENT:  Normocephalic, atraumatic.  Pupils equal, round, reactive to light  and accommodation.  Extraocular movements are intact.  Oral mucosa is pink  and moist.  Sclerae are nonicteric.  NECK:  Supple with no JVD.  There are palpable carotids with no bruits  auscultated.  There is no lymphadenopathy.  LUNGS:  Respirations are symmetrical on inspiration, unlabored, and clear.  No wheezes, no rhonchi, no rales.  CARDIAC:  Regular rate and rhythm with no murmurs, no rubs, no gallops.  There is a well healed sternotomy scar present.  ABDOMEN:  Soft and nontender and nondistended with normoactive bowel sounds.  There is a palpable abdominal aortic aneurysm present.  GU:  Deferred.  RECTAL:  Deferred.  EXTREMITIES:  There is no edema.  No varicosities.  No venous stasis  changes.  No skin breakdown or cyanosis.  Temperature is warm in the  bilateral lower extremities.   PULSES:  Radial, femoral, popliteal, and pedal pulses are 2+ bilaterally.  NEUROLOGIC:  Nonfocal.  The patient is alert and oriented  x 3.  Gait is  steady.  Muscle strength is 5/5 throughout all extremities and is symmetric.  Deep tendon reflexes are 2+.   ASSESSMENT:  Abdominal aortic aneurysm.   PLAN:  Resection and grafting of abdominal aortic aneurysm by Dr. Madilyn Fireman on  September 14, 2004.  Dr. Madilyn Fireman has seen and evaluated this patient prior to  admission and has explained the risks and benefits of the procedure and the  patient's has agreed to continue.      AY/MEDQ  D:  09/10/2004  T:  09/11/2004  Job:  [id number]   cc:   Balinda Quails, M.D.  [address]  [place]  [place] [address]   patient's hospital chart

## 2011-03-30 LAB — BASIC METABOLIC PANEL
Calcium: 8.6
Calcium: 9.5
Chloride: 100
Chloride: 104
GFR calc Af Amer: >60
GFR calc Af Amer: >60
GFR calc non Af Amer: >60
GFR calc non Af Amer: >60
Glucose, Bld: 111 — ABNORMAL HIGH
Sodium: 134 — ABNORMAL LOW

## 2011-03-30 LAB — HEPATIC FUNCTION PANEL
AST: 19
Alkaline Phosphatase: 69
Bilirubin, Direct: <0.1
Total Bilirubin: 0.6
Total Protein: 7.1

## 2011-03-30 LAB — CBC
HCT: 44.6
Hemoglobin: 15.2
Platelets: 264
RBC: 4.91
RDW: 13.2

## 2011-03-30 LAB — LIPID PANEL
Cholesterol: 231 — ABNORMAL HIGH
Triglycerides: 265 — ABNORMAL HIGH
VLDL: 53 — ABNORMAL HIGH

## 2011-03-30 LAB — CARDIAC PANEL(CRET KIN+CKTOT+MB+TROPI)
CK, MB: 6.5 — ABNORMAL HIGH
CK, MB: 8.9 — ABNORMAL HIGH
Relative Index: 4.7 — ABNORMAL HIGH
Relative Index: INVALID
Relative Index: INVALID
Total CK: 139
Total CK: 50
Troponin I: 0.25 — ABNORMAL HIGH
Troponin I: 0.37 — ABNORMAL HIGH
Troponin I: 0.59

## 2011-03-30 LAB — APTT: aPTT: 76 — ABNORMAL HIGH

## 2011-03-30 LAB — DIFFERENTIAL
Basophils Absolute: 0
Eosinophils Absolute: 0.3
Eosinophils Relative: 3
Lymphs Abs: 3.6 — ABNORMAL HIGH
Monocytes Absolute: 0.7
Neutro Abs: 4.1

## 2011-03-30 LAB — PROTIME-INR
INR: 1
Prothrombin Time: 12.9

## 2011-03-30 LAB — B-NATRIURETIC PEPTIDE (CONVERTED LAB): Pro B Natriuretic peptide (BNP): <30

## 2011-12-23 ENCOUNTER — Encounter: Payer: Self-pay | Admitting: Internal Medicine

## 2012-03-04 ENCOUNTER — Encounter (HOSPITAL_COMMUNITY): Payer: Self-pay | Admitting: *Deleted

## 2012-03-04 ENCOUNTER — Inpatient Hospital Stay (HOSPITAL_COMMUNITY)
Admission: RE | Admit: 2012-03-04 | Discharge: 2012-03-08 | DRG: 247 | Disposition: A | Discharge status: Home / Self Care | Payer: Medicare Other | Source: Other Acute Inpatient Hospital | Attending: Cardiology | Admitting: Cardiology

## 2012-03-04 DIAGNOSIS — Z955 Presence of coronary angioplasty implant and graft: Secondary | ICD-10-CM

## 2012-03-04 DIAGNOSIS — R079 Chest pain, unspecified: Secondary | ICD-10-CM

## 2012-03-04 DIAGNOSIS — Z9119 Patient's noncompliance with other medical treatment and regimen: Secondary | ICD-10-CM

## 2012-03-04 DIAGNOSIS — Z7982 Long term (current) use of aspirin: Secondary | ICD-10-CM

## 2012-03-04 DIAGNOSIS — Z9889 Other specified postprocedural states: Secondary | ICD-10-CM

## 2012-03-04 DIAGNOSIS — Z91199 Patient's noncompliance with other medical treatment and regimen due to unspecified reason: Secondary | ICD-10-CM

## 2012-03-04 DIAGNOSIS — K219 Gastro-esophageal reflux disease without esophagitis: Secondary | ICD-10-CM | POA: Diagnosis present

## 2012-03-04 DIAGNOSIS — Z9861 Coronary angioplasty status: Secondary | ICD-10-CM

## 2012-03-04 DIAGNOSIS — I252 Old myocardial infarction: Secondary | ICD-10-CM

## 2012-03-04 DIAGNOSIS — F17200 Nicotine dependence, unspecified, uncomplicated: Secondary | ICD-10-CM | POA: Diagnosis present

## 2012-03-04 DIAGNOSIS — Z79899 Other long term (current) drug therapy: Secondary | ICD-10-CM

## 2012-03-04 DIAGNOSIS — Z8249 Family history of ischemic heart disease and other diseases of the circulatory system: Secondary | ICD-10-CM

## 2012-03-04 DIAGNOSIS — I2510 Atherosclerotic heart disease of native coronary artery without angina pectoris: Secondary | ICD-10-CM | POA: Diagnosis present

## 2012-03-04 DIAGNOSIS — Z720 Tobacco use: Secondary | ICD-10-CM

## 2012-03-04 DIAGNOSIS — Z8679 Personal history of other diseases of the circulatory system: Secondary | ICD-10-CM

## 2012-03-04 DIAGNOSIS — I2582 Chronic total occlusion of coronary artery: Secondary | ICD-10-CM | POA: Diagnosis present

## 2012-03-04 DIAGNOSIS — I739 Peripheral vascular disease, unspecified: Secondary | ICD-10-CM | POA: Diagnosis present

## 2012-03-04 DIAGNOSIS — T82897A Other specified complication of cardiac prosthetic devices, implants and grafts, initial encounter: Secondary | ICD-10-CM | POA: Diagnosis present

## 2012-03-04 DIAGNOSIS — Y840 Cardiac catheterization as the cause of abnormal reaction of the patient, or of later complication, without mention of misadventure at the time of the procedure: Secondary | ICD-10-CM | POA: Diagnosis present

## 2012-03-04 DIAGNOSIS — I10 Essential (primary) hypertension: Secondary | ICD-10-CM | POA: Diagnosis present

## 2012-03-04 DIAGNOSIS — I119 Hypertensive heart disease without heart failure: Secondary | ICD-10-CM | POA: Diagnosis present

## 2012-03-04 DIAGNOSIS — E785 Hyperlipidemia, unspecified: Secondary | ICD-10-CM | POA: Diagnosis present

## 2012-03-04 DIAGNOSIS — I200 Unstable angina: Secondary | ICD-10-CM | POA: Insufficient documentation

## 2012-03-04 DIAGNOSIS — Z7902 Long term (current) use of antithrombotics/antiplatelets: Secondary | ICD-10-CM

## 2012-03-04 DIAGNOSIS — I214 Non-ST elevation (NSTEMI) myocardial infarction: Principal | ICD-10-CM | POA: Diagnosis present

## 2012-03-04 DIAGNOSIS — Y92009 Unspecified place in unspecified non-institutional (private) residence as the place of occurrence of the external cause: Secondary | ICD-10-CM

## 2012-03-04 DIAGNOSIS — I25810 Atherosclerosis of coronary artery bypass graft(s) without angina pectoris: Secondary | ICD-10-CM | POA: Diagnosis present

## 2012-03-04 HISTORY — DX: Personal history of other diseases of the circulatory system: Z86.79

## 2012-03-04 HISTORY — DX: Personal history of other diseases of the circulatory system: Z98.890

## 2012-03-04 HISTORY — DX: Gastro-esophageal reflux disease without esophagitis: K21.9

## 2012-03-04 HISTORY — DX: Tobacco use: Z72.0

## 2012-03-04 HISTORY — DX: Acute myocardial infarction, unspecified: I21.9

## 2012-03-04 LAB — COMPREHENSIVE METABOLIC PANEL
ALT: 11 U/L (ref 0–53)
AST: 15 U/L (ref 0–37)
Albumin: 3.4 g/dL — ABNORMAL LOW (ref 3.5–5.2)
CO2: 29 mEq/L (ref 19–32)
Calcium: 9.3 mg/dL (ref 8.4–10.5)
Chloride: 101 mEq/L (ref 96–112)
Creatinine, Ser: 0.9 mg/dL (ref 0.50–1.35)
GFR calc non Af Amer: >90 mL/min (ref >90)
Sodium: 137 mEq/L (ref 135–145)

## 2012-03-04 LAB — MRSA PCR SCREENING: MRSA by PCR: NEGATIVE

## 2012-03-04 MED ORDER — PANTOPRAZOLE SODIUM 40 MG PO TBEC
40.0000 mg | DELAYED_RELEASE_TABLET | Freq: Every day | ORAL | Status: DC
  Administered 2012-03-04 – 2012-03-08 (×4): 40 mg via ORAL
  Filled 2012-03-04 (×4): qty 1

## 2012-03-04 MED ORDER — SODIUM CHLORIDE 0.9 % IV SOLN: 250.0000 mL | INTRAVENOUS | Status: DC | PRN

## 2012-03-04 MED ORDER — ACETAMINOPHEN 325 MG PO TABS
650.0000 mg | ORAL_TABLET | ORAL | Status: DC | PRN
  Administered 2012-03-04 – 2012-03-06 (×5): 650 mg via ORAL
  Filled 2012-03-04 (×4): qty 2

## 2012-03-04 MED ORDER — METOPROLOL TARTRATE 25 MG PO TABS
25.0000 mg | ORAL_TABLET | Freq: Two times a day (BID) | ORAL | Status: DC
  Administered 2012-03-04 – 2012-03-08 (×8): 25 mg via ORAL
  Filled 2012-03-04 (×10): qty 1

## 2012-03-04 MED ORDER — ACETAMINOPHEN 325 MG PO TABS
650.0000 mg | ORAL_TABLET | ORAL | Status: DC | PRN
  Filled 2012-03-04: qty 2

## 2012-03-04 MED ORDER — ONDANSETRON HCL 4 MG/2ML IJ SOLN: 4.0000 mg | Freq: Four times a day (QID) | INTRAMUSCULAR | Status: DC | PRN

## 2012-03-04 MED ORDER — SODIUM CHLORIDE 0.9 % IJ SOLN
3.0000 mL | Freq: Two times a day (BID) | INTRAMUSCULAR | Status: DC
  Administered 2012-03-05 – 2012-03-07 (×4): 3 mL via INTRAVENOUS

## 2012-03-04 MED ORDER — HEPARIN BOLUS VIA INFUSION
3000.0000 [IU] | Freq: Once | INTRAVENOUS | Status: AC
  Administered 2012-03-04: 3000 [IU] via INTRAVENOUS
  Filled 2012-03-04: qty 3000

## 2012-03-04 MED ORDER — ATORVASTATIN CALCIUM 80 MG PO TABS
80.0000 mg | ORAL_TABLET | Freq: Every day | ORAL | Status: DC
  Administered 2012-03-04 – 2012-03-07 (×4): 80 mg via ORAL
  Filled 2012-03-04 (×6): qty 1

## 2012-03-04 MED ORDER — NITROGLYCERIN 0.4 MG SL SUBL: 0.4000 mg | SUBLINGUAL_TABLET | SUBLINGUAL | Status: DC | PRN

## 2012-03-04 MED ORDER — SODIUM CHLORIDE 0.9 % IJ SOLN: 3.0000 mL | INTRAMUSCULAR | Status: DC | PRN

## 2012-03-04 MED ORDER — ASPIRIN EC 81 MG PO TBEC
81.0000 mg | DELAYED_RELEASE_TABLET | Freq: Every day | ORAL | Status: DC
  Administered 2012-03-05 – 2012-03-08 (×4): 81 mg via ORAL
  Filled 2012-03-04 (×4): qty 1

## 2012-03-04 MED ORDER — HEPARIN (PORCINE) IN NACL 100-0.45 UNIT/ML-% IJ SOLN
1400.0000 [IU]/h | INTRAMUSCULAR | Status: DC
  Administered 2012-03-04 – 2012-03-05 (×2): 1400 [IU]/h via INTRAVENOUS
  Filled 2012-03-04 (×2): qty 250

## 2012-03-04 MED ORDER — CLOPIDOGREL BISULFATE 75 MG PO TABS
75.0000 mg | ORAL_TABLET | Freq: Every day | ORAL | Status: DC
  Administered 2012-03-04 – 2012-03-05 (×2): 75 mg via ORAL
  Filled 2012-03-04 (×3): qty 1

## 2012-03-04 NOTE — H&P (Signed)
History and Physical   Admit date: 03/04/2012 Name:  Clifford Jones Medical record number: [id number] DOB/Age:  [birth year]  61 y.o. male  Referring Physician:  [facility]  Primary Cardiologist: [facility]  Primary Physician:  Selinda Flavin  Chief complaint/reason for admission: Chest pain  HPI:  This 61 year old male has a prior history of an inferior infarct in 1999. He had three-vessel disease in 2003 and had unsuccessful attempts to open up a chronically occluded right coronary artery. He had bypass grafting x6 at that time. He later presented in 2008 with a non-ST elevation myocardial infarction and had a degenerated saphenous vein graft to both the diagonal system as well as the marginal system. The marginal system had placement of 2 non-drug-eluting stents by Dr. Excell Seltzer. There was a report in the catheterization note of it being a difficult procedure. He also has a prior history of an abdominal aneurysm with repair. He has a history of medical noncompliance and has not seen a cardiologist in several years and has not seen his family doctor in some period of time. He ran out of most of his medications and stopped taking his Plavix about a week ago. He awoke this morning with midsternal chest discomfort similar to his previous heart pain described as severe was taken by EMS to [facility]. He was given 4 baby aspirin and 3 nitroglycerin placed on heparin and eventually given Zofran and morphine. EKG did not show any acute ST changes. He remained in the emergency room most of the day. At the present time his initial troponins were negative and he is only complaining of a headache. 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 He does not have claudication. Does have a lot of digestive complaints. He has no PND, orthopnea or edema.   Past Medical History  Diagnosis Date  . Hypertension   . Hyperlipidemia   . Coronary  artery disease   . Myocardial infarction 1999  . GERD (gastroesophageal reflux disease)      Past Surgical History  Procedure Date  . Abdominal aortic aneurysm repair 2006  . Coronary artery bypass graft 2003  . Hernia repair    Allergies:  has no known allergies.   Medications: Prior to Admission medications   Medication Sig Start Date End Date Taking? Authorizing Provider  aspirin 325 MG tablet Take 325 mg by mouth daily.   Yes Historical Provider, MD  clopidogrel (PLAVIX) 75 MG tablet Take 75 mg by mouth daily.   Yes Historical Provider, MD   Family History:  Family Status  Relation Status Death Age  . Father Deceased 36    died of MI  . Mother Deceased 35    unknown causes  . Brother Deceased     unknown  . Sister Deceased     unknown  . Sister Alive     Social History:   reports that he has been smoking.  He does not have any smokeless tobacco history on file. He reports that he drinks alcohol. He reports that he does not use illicit drugs. he continues to smoke greater than one pack of cigarettes per day. Drinks occasional alcohol.   History   Social History Narrative   Disabled.  Former truck Software engineer. Divorced and remarried x 2     Review of Systems: He complains of some difficulty swallowing solid food. He has symptoms of reflux esophagitis.  He has some mild constipation. He complains of significant generalized arthritis and states that his legs hurt when he walks. He is mild shortness of breath.  Other than as noted above, the remainder of the review of systems is normal  Physical Exam: BP 151/87  Temp 98.4 F (36.9 C) (Oral)  SpO2 96% General appearance: alert, cooperative, appears stated age and no distress Head: Normocephalic, without obvious abnormality, atraumatic, Male pattern baldness Eyes: conjunctivae/corneas clear. PERRL, EOM's intact. Fundi not examined Throat: Edentulous Neck: no adenopathy, no carotid bruit, no JVD and  supple, symmetrical, trachea midline Lungs: clear to auscultation bilaterally Heart: regular rate and rhythm, S1, S2 normal, no murmur, click, rub or gallop, healed median sternotomy scar Abdomen: soft, non-tender; bowel sounds normal; no masses,  no organomegaly, midline scar noted Rectal: deferred Extremities: extremities normal, atraumatic, no cyanosis or edema and Healed saphenous vein harvesting scar on right lower extremity Pulses: 2+ and symmetric Skin: Skin color, texture, turgor normal. No rashes or lesions Neurologic: Grossly normal Labs: Reviewed from outside hospital and show initial negative troponin, normal PT and PTT hemoglobin 14.1 hematocrit 41.4, normal chemistry panel, normal troponins of 0.03  EKG: KG from outside hospital shows sinus rhythm with old inferior infarction  Radiology: Chest x-ray from outside hospital report shows previous CABG, upper limits of normal heart size, lingular scarring   IMPRESSIONS: 1. Prolonged chest discomfort consistent with unstable angina 2. Coronary artery disease with previous bypass grafting and previous inferior infarction 3. Saphenous vein graft disease with previous degenerated saphenous vein graft with 2 bare-metal stents placed in 2008 4. Hyperlipidemia 5. Hypertensive heart disease 6. Peripheral vascular disease with previous abdominal aneurysm repair 7. Medical noncompliance  PLAN: Admit to step down unit, intravenous heparin. Because of his headache we will stop his nitroglycerin. Keep n.p.o. for probable catheterization.  Signed: Darden Palmer MD [facility]  03/04/2012, 8:31 PM

## 2012-03-04 NOTE — Progress Notes (Signed)
ANTICOAGULATION CONSULT NOTE - Initial Consult  Pharmacy Consult for heparin Indication: chest pain/ACS  No Known Allergies  Patient Measurements: Height: 6' (182.9 cm) Weight: 198 lb 10.2 oz (90.1 kg) IBW/kg (Calculated) : 77.6  Heparin Dosing Weight: 90kg  Vital Signs: Temp: 98 F (36.7 C) (09/15 2033) Temp src: Oral (09/15 2033) BP: 138/75 mmHg (09/15 2034) Pulse Rate: 64  (09/15 2034)  Labs:  Clifford Jones 03/04/12 2134  HGB --  HCT --  PLT --  APTT --  LABPROT --  INR --  HEPARINUNFRC <0.10*  CREATININE 0.90  CKTOTAL --  CKMB --  TROPONINI --    Estimated Creatinine Clearance: 97 ml/min (by C-G formula based on Cr of 0.9).   Medical History: Past Medical History  Diagnosis Date  . Hypertension   . Hyperlipidemia   . Coronary artery disease   . Myocardial infarction 1999  . GERD (gastroesophageal reflux disease)     Medications:  Prescriptions prior to admission  Medication Sig Dispense Refill  . aspirin 325 MG tablet Take 325 mg by mouth daily.      . clopidogrel (PLAVIX) 75 MG tablet Take 75 mg by mouth daily.       Scheduled:    . aspirin EC  81 mg Oral Daily  . atorvastatin  80 mg Oral q1800  . clopidogrel  75 mg Oral Daily  . heparin  3,000 Units Intravenous Once  . metoprolol tartrate  25 mg Oral BID  . pantoprazole  40 mg Oral Q1200  . sodium chloride  3 mL Intravenous Q12H    Assessment: 61yo male with significant cardiac hx presented to [facility] with prolonged CP c/w USAP, transferred to [facility] with heparin running at 1000 units/hr with initial level undetectable.  Goal of Therapy:  Heparin level 0.3-0.7 units/ml Monitor platelets by anticoagulation protocol: Yes   Plan:  Will rebolus with 3000 units of heparin x1 and increase gtt by 4 units/kg/hr to 1400 units/hr and check level with am labs.  Colleen Can PharmD BCPS 03/04/2012,10:46 PM

## 2012-03-05 ENCOUNTER — Encounter (HOSPITAL_COMMUNITY)
Admission: RE | Disposition: A | Discharge status: Home / Self Care | Payer: Self-pay | Source: Other Acute Inpatient Hospital | Attending: Cardiology

## 2012-03-05 ENCOUNTER — Encounter (HOSPITAL_COMMUNITY): Payer: Self-pay | Admitting: Cardiology

## 2012-03-05 DIAGNOSIS — I2510 Atherosclerotic heart disease of native coronary artery without angina pectoris: Secondary | ICD-10-CM

## 2012-03-05 HISTORY — PX: LEFT HEART CATHETERIZATION WITH CORONARY ANGIOGRAM: SHX5451

## 2012-03-05 LAB — LIPID PANEL
Cholesterol: 170 mg/dL (ref 0–200)
HDL: 33 mg/dL — ABNORMAL LOW (ref >39)
Total CHOL/HDL Ratio: 5.2 RATIO
VLDL: 55 mg/dL — ABNORMAL HIGH (ref 0–40)

## 2012-03-05 LAB — CBC
HCT: 38.4 % — ABNORMAL LOW (ref 39.0–52.0)
Hemoglobin: 13.2 g/dL (ref 13.0–17.0)
MCH: 30.7 pg (ref 26.0–34.0)
MCHC: 34.4 g/dL (ref 30.0–36.0)
MCV: 89.3 fL (ref 78.0–100.0)
RDW: 12.8 % (ref 11.5–15.5)

## 2012-03-05 LAB — PLATELET INHIBITION P2Y12: Platelet Function  P2Y12: 258 [PRU] (ref 194–418)

## 2012-03-05 LAB — HEMOGLOBIN A1C: Mean Plasma Glucose: 128 mg/dL — ABNORMAL HIGH (ref <117)

## 2012-03-05 LAB — TSH: TSH: 0.779 u[IU]/mL (ref 0.350–4.500)

## 2012-03-05 LAB — CK TOTAL AND CKMB (NOT AT ARMC): Relative Index: INVALID (ref 0.0–2.5)

## 2012-03-05 LAB — POCT ACTIVATED CLOTTING TIME: Activated Clotting Time: 104 seconds

## 2012-03-05 SURGERY — LEFT HEART CATHETERIZATION WITH CORONARY ANGIOGRAM
Anesthesia: LOCAL

## 2012-03-05 MED ORDER — DIAZEPAM 5 MG PO TABS
ORAL_TABLET | ORAL | Status: AC
  Administered 2012-03-05: 5 mg via ORAL
  Filled 2012-03-05: qty 1

## 2012-03-05 MED ORDER — SODIUM CHLORIDE 0.9 % IV SOLN: 250.0000 mL | INTRAVENOUS | Status: DC | PRN

## 2012-03-05 MED ORDER — METOPROLOL TARTRATE 1 MG/ML IV SOLN: 5.0000 mg | Freq: Once | INTRAVENOUS | Status: DC

## 2012-03-05 MED ORDER — LIDOCAINE HCL (PF) 1 % IJ SOLN
INTRAMUSCULAR | Status: AC
  Filled 2012-03-05: qty 30

## 2012-03-05 MED ORDER — DIAZEPAM 5 MG PO TABS
5.0000 mg | ORAL_TABLET | ORAL | Status: AC
  Administered 2012-03-05: 5 mg via ORAL

## 2012-03-05 MED ORDER — HEPARIN (PORCINE) IN NACL 100-0.45 UNIT/ML-% IJ SOLN
1400.0000 [IU]/h | INTRAMUSCULAR | Status: DC
  Administered 2012-03-05: 1000 [IU]/h via INTRAVENOUS
  Administered 2012-03-06 (×2): 1400 [IU]/h via INTRAVENOUS
  Filled 2012-03-05 (×3): qty 250

## 2012-03-05 MED ORDER — NITROGLYCERIN 0.2 MG/ML ON CALL CATH LAB
INTRAVENOUS | Status: AC
  Filled 2012-03-05: qty 1

## 2012-03-05 MED ORDER — SODIUM CHLORIDE 0.9 % IV SOLN
INTRAVENOUS | Status: AC
  Administered 2012-03-05: 125 mL via INTRAVENOUS

## 2012-03-05 MED ORDER — HYDRALAZINE HCL 20 MG/ML IJ SOLN
INTRAMUSCULAR | Status: AC
  Filled 2012-03-05: qty 1

## 2012-03-05 MED ORDER — METOPROLOL TARTRATE 1 MG/ML IV SOLN
INTRAVENOUS | Status: AC
  Filled 2012-03-05: qty 5

## 2012-03-05 MED ORDER — SODIUM CHLORIDE 0.9 % IJ SOLN: 3.0000 mL | Freq: Two times a day (BID) | INTRAMUSCULAR | Status: DC

## 2012-03-05 MED ORDER — SODIUM CHLORIDE 0.9 % IJ SOLN: 3.0000 mL | INTRAMUSCULAR | Status: DC | PRN

## 2012-03-05 MED ORDER — HEPARIN (PORCINE) IN NACL 2-0.9 UNIT/ML-% IJ SOLN
INTRAMUSCULAR | Status: AC
  Filled 2012-03-05: qty 1500

## 2012-03-05 MED FILL — Nitroglycerin IV Soln 5 MG/ML: INTRAVENOUS | Qty: 10 | Status: AC

## 2012-03-05 MED FILL — Heparin Sodium (Porcine) 100 Unt/ML in Sodium Chloride 0.45%: INTRAMUSCULAR | Qty: 250 | Status: AC

## 2012-03-05 NOTE — Progress Notes (Signed)
ANTICOAGULATION CONSULT NOTE - Follow Up Consult  Pharmacy Consult for heparin Indication: chest pain/ACS  Labs:  Basename 03/05/12 0655 03/05/12 0515 03/04/12 2349 03/04/12 2134  HGB -- 13.2 -- --  HCT -- 38.4* -- --  PLT -- 209 -- --  APTT -- -- -- --  LABPROT -- -- -- --  INR -- -- -- --  HEPARINUNFRC -- 0.46 -- <0.10*  CREATININE -- -- -- 0.90  CKTOTAL 70 -- -- --  CKMB 6.5* -- -- --  TROPONINI -- 0.69* 0.87* --    Assessment 61yo male admitted for chest pain s/p LHC today. Heparin to restart 8 hour post sheath removal. No bolus. Goal heparin level 0.3-0.5 units/ml.  Plan: - Heparin 1400units/hr to start tonight at 2100 - Obtain 6 hour heparin level - Monitor for signs and symptoms of bleeding  Thank you, Franchot Erichsen, Pharm.D. Clinical Pharmacist   Pager: [phone] 03/05/2012 3:18 PM

## 2012-03-05 NOTE — Progress Notes (Signed)
CRITICAL VALUE ALERT  Critical value received:  CK-MB 6.5  Date of notification:  03/05/12  Time of notification: 0805  Critical value read back: yes  Nurse who received alert: Lindajo Royal, RN  MD notified (1st page): Theodore Demark, PA  Time of first page:  0815  MD notified (2nd page):  Time of second page:  Responding MD:  Theodore Demark, PA  Time MD responded: 0830  Bjorn Loser, PA to page MD regarding elevated CK-MB and evaluate need for cardiac cath today.

## 2012-03-05 NOTE — Progress Notes (Addendum)
PM  Stable at present.  No chest pain.   Groin ok. Films reviewed with Dr. Excell Seltzer.  Leaning toward a PCI of SVG although risk is increased and likelihood of long term patency not great.  However, it has held up for nearly five years since the last PCI and the distal bed not too disimilar from that procedure.   Will review at cath conference with final plan.   Will check P2Y12 in am.   Will reduce heparin dosing until am then it can be modified.

## 2012-03-05 NOTE — H&P (View-Only) (Signed)
Subjective:  Patient seen and examined and chart reviewed.  He has recurrent chest pain.  Last cath report reviewed and findings noted.  Patient has symptoms similar to before, and enzymes are positive.  Set up for cath today.  Procedure reviewed.  Patient agreeable.    Objective:  Vital Signs in the last 24 hours: Temp:  [98 F (36.7 C)-98.9 F (37.2 C)] 98.2 F (36.8 C) (09/16 0745) Pulse Rate:  [60-69] 63  (09/16 0800) Resp:  [18] 18  (09/16 0745) BP: (124-154)/(68-87) 154/76 mmHg (09/16 0745) SpO2:  [95 %-99 %] 96 % (09/16 0800) Weight:  [198 lb 10.2 oz (90.1 kg)] 198 lb 10.2 oz (90.1 kg) (09/15 2034)  Intake/Output from previous day: 09/15 0701 - 09/16 0700 In: 166 [I.V.:166] Out: 800 [Urine:800]   Physical Exam: General: Well developed, well nourished, in no acute distress. Head:  Normocephalic and atraumatic. Lungs: Expiratory wheezes.   Heart: Normal S1 and S2.  No murmur, rubs or gallops.  Pulses: Pulses normal in all 4 extremities. Extremities: No clubbing or cyanosis. No edema. Neurologic: Alert and oriented x 3.    Lab Results:  [facility] 03/05/12 0515  WBC 9.6  HGB 13.2  PLT 209    Basename 03/04/12 2134  NA 137  K 4.3  CL 101  CO2 29  GLUCOSE 118*  BUN 13  CREATININE 0.90    Basename 03/05/12 0515 03/04/12 2349  TROPONINI 0.69* 0.87*   Hepatic Function Panel  Basename 03/04/12 2134  PROT 6.6  ALBUMIN 3.4*  AST 15  ALT 11  ALKPHOS 71  BILITOT 0.3  BILIDIR --  IBILI --    Basename 03/05/12 0515  CHOL 170   No results found for this basename: PROTIME in the last 72 hours  Imaging: No results found.  EKG:  No  Acute changes.     Assessment/Plan:  1.  Non STEMI with prior CABG 2.  Extreme tortuosity of the right iliac system.    Plan cath today.  With bilateral mammaries, and findings will attempt to use left groin as the access site.        Shawnie Pons, MD, [facility], FSCAI 03/05/2012, 10:14 AM

## 2012-03-05 NOTE — Progress Notes (Signed)
CRITICAL VALUE ALERT  Critical value received:  Troponin=0.87  Date of notification:  03/05/2012  Time of notification:  01:30 AM  Critical value read back:yes  Nurse who received alert:  Sander Radon RN  MD notified (1st page):  Dr. Donnie Aho  Time of first page:  01:50AM  MD notified (2nd page):  Time of second page:  Responding MD:  Dr.Tilley  Time MD responded:  02:00 AM

## 2012-03-05 NOTE — CV Procedure (Signed)
   Cardiac Catheterization Procedure Note  Name: Clifford Jones MRN: [id number] DOB: 01-21-1951  Procedure: Left Heart Cath, Selective Coronary Angiography, LV angiography, SVG and LIMA/RIMA angiography.  Indication: The patient has had prior CABG and also PCI, and now has non STEMI.  Last procedure was difficult due to access issues.  Plan cath from LFA if possible    Procedural details: The left groin was prepped, draped, and anesthetized with 1% lidocaine. Using modified Seldinger technique, a 5 French sheath was introduced into the left femoral artery. Standard Judkins catheters were used for coronary angiography, SVG and LIMA angio and left ventriculography. Multiple attempts to get a wire to the R subclavian with a wire were unsuccessful. As such, a non selective injection was performed, and due to the appearance, we elected to do a nonselective injections.   Catheter exchanges were performed over a guidewire. There were no immediate procedural complications. The patient was transferred to the post catheterization recovery area for further monitoring.  Procedural Findings: Hemodynamics:  AO 154/79  (108) LV 157/12 No gradient on pullback   Coronary angiography: Coronary dominance: right  Left mainstem: 60% ostial narrowing.    Left anterior descending (LAD): The patient has about 10% distal LMCA narrowing, then 70% before the takeoff of the diagonal.  The diagonal is large and has 70-80% narrowing proximally, then a sub branch has a segmental 70-80% narrowing.  The LAD after the septal is subtotally occluded and has competitive filling distally.    Left circumflex (LCx): The CFX has diffuse plaque and provides a very small OM1, then is basically occluded distally.    Right coronary artery (RCA): Total occlusion proximally  The LIMA to the LAD is widely patent.  After the insertion, there is about 70% segmental plaque leading to the distal LAD.  The SVG to the diagonal system is  now occluded.  In 2008, it was severely diseased.   The SVG to the large OM has been stented.  There are severe degenerative changes.  The ostial takeoff has 90% narrowing, then there are multiple stents with 90% in stent restenosis.  Distally the vessel goes into a small caliber OM vessel with 80 and 70% lesions.  The OM is smaller in caliber, being less than 2.73mm in size.    The RIMA to the distal RCA was non selectively engaged. The RIMA appears to not have disease and fills in to the PLA area.  The PDA may be collateralized from the LIMA.    Left ventriculography: Overall EF is reduced at about 50%.  There is a focal area of basal hypokinesis in the mid inferior segment, ? Distribution of CFX.    Final Conclusions:   1.  Progression of disease from 2008 cath study with further degeneration of the graft to OM, occlusion of the graft to the diagonal, and patent bilateral IMAs but with some distal disease.    Recommendations:  1.  We will review findings with colleagues.  Redo CABG would seem to carry increase risk without matched benefit given the vessels involved.  PCI will be higher risk for periprocedural MI.  We will get a consensus before preceeding given the options.    Shawnie Pons 03/05/2012, 12:33 PM

## 2012-03-05 NOTE — Interval H&P Note (Signed)
History and Physical Interval Note:  03/05/2012 11:04 AM  Clifford Jones  has presented today for surgery, with the diagnosis of cp  The various methods of treatment have been discussed with the patient and family. After consideration of risks, benefits and other options for treatment, the patient has consented to  Procedure(s) (LRB) with comments: LEFT HEART CATHETERIZATION WITH CORONARY ANGIOGRAM (N/A) as a surgical intervention .  The patient's history has been reviewed, patient examined, no change in status, stable for surgery.  I have reviewed the patient's chart and labs.  Questions were answered to the patient's satisfaction.     Shawnie Pons

## 2012-03-05 NOTE — Progress Notes (Signed)
Subjective:  Patient seen and examined and chart reviewed.  He has recurrent chest pain.  Last cath report reviewed and findings noted.  Patient has symptoms similar to before, and enzymes are positive.  Set up for cath today.  Procedure reviewed.  Patient agreeable.    Objective:  Vital Signs in the last 24 hours: Temp:  [98 F (36.7 C)-98.9 F (37.2 C)] 98.2 F (36.8 C) (09/16 0745) Pulse Rate:  [60-69] 63  (09/16 0800) Resp:  [18] 18  (09/16 0745) BP: (124-154)/(68-87) 154/76 mmHg (09/16 0745) SpO2:  [95 %-99 %] 96 % (09/16 0800) Weight:  [198 lb 10.2 oz (90.1 kg)] 198 lb 10.2 oz (90.1 kg) (09/15 2034)  Intake/Output from previous day: 09/15 0701 - 09/16 0700 In: 166 [I.V.:166] Out: 800 [Urine:800]   Physical Exam: General: Well developed, well nourished, in no acute distress. Head:  Normocephalic and atraumatic. Lungs: Expiratory wheezes.   Heart: Normal S1 and S2.  No murmur, rubs or gallops.  Pulses: Pulses normal in all 4 extremities. Extremities: No clubbing or cyanosis. No edema. Neurologic: Alert and oriented x 3.    Lab Results:  Basename 03/05/12 0515  WBC 9.6  HGB 13.2  PLT 209    Basename 03/04/12 2134  NA 137  K 4.3  CL 101  CO2 29  GLUCOSE 118*  BUN 13  CREATININE 0.90    Basename 03/05/12 0515 03/04/12 2349  TROPONINI 0.69* 0.87*   Hepatic Function Panel  Basename 03/04/12 2134  PROT 6.6  ALBUMIN 3.4*  AST 15  ALT 11  ALKPHOS 71  BILITOT 0.3  BILIDIR --  IBILI --    Basename 03/05/12 0515  CHOL 170   No results found for this basename: PROTIME in the last 72 hours  Imaging: No results found.  EKG:  No  Acute changes.     Assessment/Plan:  1.  Non STEMI with prior CABG 2.  Extreme tortuosity of the right iliac system.    Plan cath today.  With bilateral mammaries, and findings will attempt to use left groin as the access site.        Nathanal Hermiz, MD, FACC, FSCAI 03/05/2012, 10:14 AM    

## 2012-03-05 NOTE — Progress Notes (Signed)
ANTICOAGULATION CONSULT NOTE - Follow Up Consult  Pharmacy Consult for heparin Indication: chest pain/ACS  Labs:  Basename 03/05/12 0515 03/04/12 2349 03/04/12 2134  HGB 13.2 -- --  HCT 38.4* -- --  PLT 209 -- --  APTT -- -- --  LABPROT -- -- --  INR -- -- --  HEPARINUNFRC 0.46 -- <0.10*  CREATININE -- -- 0.90  CKTOTAL -- -- --  CKMB -- -- --  TROPONINI -- 0.87* --    Assessment/Plan:  61yo male now therapeutic on heparin for CP.  Scheduled for cath at 1030 this am.  Will continue gtt at current rate and f/u after cath.  Colleen Can PharmD BCPS 03/05/2012,6:28 AM

## 2012-03-06 DIAGNOSIS — I200 Unstable angina: Secondary | ICD-10-CM

## 2012-03-06 LAB — HEPARIN LEVEL (UNFRACTIONATED)
Heparin Unfractionated: <0.1 IU/mL — ABNORMAL LOW (ref 0.30–0.70)
Heparin Unfractionated: 0.3 IU/mL (ref 0.30–0.70)

## 2012-03-06 LAB — CBC
MCH: 30.6 pg (ref 26.0–34.0)
MCH: 30.8 pg (ref 26.0–34.0)
Platelets: 203 10*3/uL (ref 150–400)
Platelets: 206 10*3/uL (ref 150–400)
RBC: 4.44 MIL/uL (ref 4.22–5.81)
RBC: 4.54 MIL/uL (ref 4.22–5.81)
RDW: 12.6 % (ref 11.5–15.5)
WBC: 7.7 10*3/uL (ref 4.0–10.5)

## 2012-03-06 LAB — BASIC METABOLIC PANEL
CO2: 28 mEq/L (ref 19–32)
Calcium: 9.5 mg/dL (ref 8.4–10.5)
Calcium: 9.5 mg/dL (ref 8.4–10.5)
GFR calc Af Amer: >90 mL/min (ref >90)
GFR calc Af Amer: >90 mL/min (ref >90)
GFR calc non Af Amer: >90 mL/min (ref >90)
GFR calc non Af Amer: >90 mL/min (ref >90)
Sodium: 137 mEq/L (ref 135–145)
Sodium: 137 mEq/L (ref 135–145)

## 2012-03-06 MED ORDER — SODIUM CHLORIDE 0.9 % IJ SOLN
3.0000 mL | INTRAMUSCULAR | Status: DC | PRN
  Administered 2012-03-06: 3 mL via INTRAVENOUS

## 2012-03-06 MED ORDER — PRASUGREL HCL 10 MG PO TABS
60.0000 mg | ORAL_TABLET | Freq: Once | ORAL | Status: AC
  Administered 2012-03-06: 60 mg via ORAL
  Filled 2012-03-06: qty 6

## 2012-03-06 MED ORDER — PRASUGREL HCL 10 MG PO TABS
10.0000 mg | ORAL_TABLET | ORAL | Status: AC
  Administered 2012-03-07: 10 mg via ORAL
  Filled 2012-03-06: qty 1

## 2012-03-06 MED ORDER — SODIUM CHLORIDE 0.9 % IV SOLN: 250.0000 mL | INTRAVENOUS | Status: DC | PRN

## 2012-03-06 MED ORDER — HEPARIN (PORCINE) IN NACL 100-0.45 UNIT/ML-% IJ SOLN
1700.0000 [IU]/h | INTRAMUSCULAR | Status: DC
  Administered 2012-03-07: 1700 [IU]/h via INTRAVENOUS
  Filled 2012-03-06: qty 250

## 2012-03-06 MED ORDER — SODIUM CHLORIDE 0.9 % IJ SOLN
3.0000 mL | Freq: Two times a day (BID) | INTRAMUSCULAR | Status: DC
  Administered 2012-03-06 (×2): 3 mL via INTRAVENOUS

## 2012-03-06 MED ORDER — SODIUM CHLORIDE 0.9 % IV SOLN
INTRAVENOUS | Status: DC
  Administered 2012-03-07: 05:00:00 via INTRAVENOUS

## 2012-03-06 MED ORDER — DIAZEPAM 5 MG PO TABS: 5.0000 mg | ORAL_TABLET | ORAL | Status: DC

## 2012-03-06 NOTE — Progress Notes (Signed)
ANTICOAGULATION CONSULT NOTE - Follow Up Consult  Pharmacy Consult for heparin Indication: s/p cath awaiting tx plan  Labs:  Basename 03/06/12 0250 03/05/12 0655 03/05/12 0515 03/04/12 2349 03/04/12 2134  HGB 13.6 -- 13.2 -- --  HCT 39.6 -- 38.4* -- --  PLT 203 -- 209 -- --  APTT -- -- -- -- --  LABPROT -- -- -- -- --  INR -- -- -- -- --  HEPARINUNFRC <0.10* -- 0.46 -- <0.10*  CREATININE 0.77 -- -- -- 0.90  CKTOTAL -- 70 -- -- --  CKMB -- 6.5* -- -- --  TROPONINI -- -- 0.69* 0.87* --   Assessment: 61yo male now undetectable on heparin after resumed at low rate s/p cath.  Goal of Therapy:  Heparin level 0.3-0.5 units/ml   Plan:  Will increase heparin gtt to 1400 units/hr (pt had level at goal 0.46 at this rate prior to cath) and check level in 6hr.  Colleen Can PharmD BCPS 03/06/2012,4:01 AM

## 2012-03-06 NOTE — Progress Notes (Signed)
ANTICOAGULATION CONSULT NOTE - Follow Up Consult  Pharmacy Consult for heparin Indication: s/p cath awaiting tx plan  Labs:  Basename 03/06/12 2114 03/06/12 1223 03/06/12 0250 03/05/12 0655 03/05/12 0515 03/04/12 2349 03/04/12 2134  HGB -- 14.0 13.6 -- -- -- --  HCT -- 40.5 39.6 -- 38.4* -- --  PLT -- 206 203 -- 209 -- --  APTT -- -- -- -- -- -- --  LABPROT -- -- -- -- -- -- --  INR -- -- -- -- -- -- --  HEPARINUNFRC 0.30 <0.10* <0.10* -- -- -- --  CREATININE -- 0.73 0.77 -- -- -- 0.90  CKTOTAL -- -- -- 70 -- -- --  CKMB -- -- -- 6.5* -- -- --  TROPONINI -- -- -- -- 0.69* 0.87* --   Assessment: 61yo male for PCI on 9/18.  Heparin level is now therapeutic after rate adjustment.  Goal of Therapy:  Heparin level 0.3-0.5 units/ml   Plan:  Continue Heparin at 1700 units/hr Recheck Heparin level and CBC in AM to confirm.  Estella Husk, Pharm.D., BCPS Clinical Pharmacist  Phone [phone] Pager [phone] 03/06/2012, 9:42 PM

## 2012-03-06 NOTE — Progress Notes (Signed)
Subjective:  Patient doing well.  No current pain.  Films reviewed with MC--plan for SVG PCI tomorrow, possibly from left radial.    Objective:  Vital Signs in the last 24 hours: Temp:  [97.5 F (36.4 C)-98.3 F (36.8 C)] 98.2 F (36.8 C) (09/17 0800) Pulse Rate:  [58-74] 68  (09/17 0800) Resp:  [17-22] 17  (09/17 0800) BP: (101-154)/(28-86) 140/85 mmHg (09/17 0800) SpO2:  [92 %-98 %] 96 % (09/17 0800) Weight:  [195 lb 4.8 oz (88.587 kg)] 195 lb 4.8 oz (88.587 kg) (09/17 0500)  Intake/Output from previous day: 09/16 0701 - 09/17 0700 In: 366 [P.O.:240; I.V.:126] Out: 3350 [Urine:3350]   Physical Exam: General: Well developed, well nourished, in no acute distress. Head:  Normocephalic and atraumatic. Lungs: decrease BS bilaterally.  Heart: Normal S1 and S2.  No murmur, rubs or gallops.  Pulses: Pulses normal in all 4 extremities.  Left groin looks ok.   Extremities: No clubbing or cyanosis. No edema. Neurologic: Alert and oriented x 3.    Lab Results:  Basename 03/06/12 0250 03/05/12 0515  WBC 10.0 9.6  HGB 13.6 13.2  PLT 203 209    Basename 03/06/12 0250 03/04/12 2134  NA 137 137  K 3.9 4.3  CL 102 101  CO2 28 29  GLUCOSE 113* 118*  BUN 15 13  CREATININE 0.77 0.90    Basename 03/05/12 0515 03/04/12 2349  TROPONINI 0.69* 0.87*   Hepatic Function Panel  Basename 03/04/12 2134  PROT 6.6  ALBUMIN 3.4*  AST 15  ALT 11  ALKPHOS 71  BILITOT 0.3  BILIDIR --  IBILI --    Basename 03/05/12 0515  CHOL 170   No results found for this basename: PROTIME in the last 72 hours  Imaging: No results found.   Assessment/Plan:  Non STEMI with severe SVG disease  (with prior PCI new lesion plus ISR) Patent IMA bilateral to RCA territory in part and LAD territory Poor patient compliance.  Hyporesponsiveness to clopidogrel.    Plan PCI tomorrow Discussed with patient who understands increased risks.   DC clopidogrel and start prasugrel given ACS, non  responsiveness.  Patient has no prior history of CVA or major bleed.        Shawnie Pons, MD, [facility], FSCAI 03/06/2012, 9:58 AM

## 2012-03-06 NOTE — Care Management Note (Signed)
    Page 1 of 1   03/06/2012     2:13:19 PM   CARE MANAGEMENT NOTE 03/06/2012  Patient:  Clifford, Jones   Account Number:  [id number]  Date Initiated:  03/05/2012  Documentation initiated by:  Junius Creamer  Subjective/Objective Assessment:   adm w pos troponins     Action/Plan:   lives w fam, pcp dr Renne Crigler howard   Anticipated DC Date:     Anticipated DC Plan:        DC Planning Services  CM consult      Choice offered to / List presented to:             Status of service:   Medicare Important Message given?   (If response is "NO", the following Medicare IM given date fields will be blank) Date Medicare IM given:   Date Additional Medicare IM given:    Discharge Disposition:    Per UR Regulation:  Reviewed for med. necessity/level of care/duration of stay  If discussed at Long Length of Stay Meetings, dates discussed:    Comments:  9/17 14:11pm debbie Neelah Mannings rn,bsn spoke w pt. he has medicare. spoke w financial co to alert them of ins. medicaid not act at this time. gave pt effient pt assist card and 30days free. gave pt 2 cards that may assist w copays for brand name meds. left effient pt assist form in chart for md to sign.  9/16 12:25p debbie Milisa Kimbell rn,bsn [phone]

## 2012-03-06 NOTE — Progress Notes (Signed)
Pt wife name and telephone  Contact :  Mrs. Wyline Copas [phone]

## 2012-03-06 NOTE — Progress Notes (Signed)
ANTICOAGULATION CONSULT NOTE - Follow Up Consult  Pharmacy Consult for heparin Indication: s/p cath awaiting tx plan  Labs:  Basename 03/06/12 1223 03/06/12 0250 03/05/12 0655 03/05/12 0515 03/04/12 2349 03/04/12 2134  HGB 14.0 13.6 -- -- -- --  HCT 40.5 39.6 -- 38.4* -- --  PLT 206 203 -- 209 -- --  APTT -- -- -- -- -- --  LABPROT -- -- -- -- -- --  INR -- -- -- -- -- --  HEPARINUNFRC <0.10* <0.10* -- 0.46 -- --  CREATININE 0.73 0.77 -- -- -- 0.90  CKTOTAL -- -- 70 -- -- --  CKMB -- -- 6.5* -- -- --  TROPONINI -- -- -- 0.69* 0.87* --   Assessment: 61yo male now undetectable x2 on heparin after resumed s/p cath. Patient noted for PCI on 9/18.  Goal of Therapy:  Heparin level 0.3-0.5 units/ml   Plan:  -Increase heparin to 1700 units/hr -Heparin level in 6hrs  Harland German, Pharm D 03/06/2012 2:51 PM

## 2012-03-07 ENCOUNTER — Encounter (HOSPITAL_COMMUNITY)
Admission: RE | Disposition: A | Discharge status: Home / Self Care | Payer: Self-pay | Source: Other Acute Inpatient Hospital | Attending: Cardiology

## 2012-03-07 DIAGNOSIS — I25810 Atherosclerosis of coronary artery bypass graft(s) without angina pectoris: Secondary | ICD-10-CM

## 2012-03-07 DIAGNOSIS — I214 Non-ST elevation (NSTEMI) myocardial infarction: Secondary | ICD-10-CM

## 2012-03-07 HISTORY — PX: PERCUTANEOUS CORONARY STENT INTERVENTION (PCI-S): SHX5485

## 2012-03-07 LAB — CBC
Hemoglobin: 13.3 g/dL (ref 13.0–17.0)
MCHC: 34.1 g/dL (ref 30.0–36.0)
WBC: 8.3 10*3/uL (ref 4.0–10.5)

## 2012-03-07 LAB — HEPARIN LEVEL (UNFRACTIONATED): Heparin Unfractionated: 0.17 IU/mL — ABNORMAL LOW (ref 0.30–0.70)

## 2012-03-07 SURGERY — PERCUTANEOUS CORONARY STENT INTERVENTION (PCI-S)
Anesthesia: LOCAL

## 2012-03-07 MED ORDER — PRASUGREL HCL 10 MG PO TABS
10.0000 mg | ORAL_TABLET | Freq: Every day | ORAL | Status: DC
  Administered 2012-03-08: 10 mg via ORAL
  Filled 2012-03-07: qty 1

## 2012-03-07 MED ORDER — MIDAZOLAM HCL 2 MG/2ML IJ SOLN
INTRAMUSCULAR | Status: AC
  Filled 2012-03-07: qty 2

## 2012-03-07 MED ORDER — SODIUM CHLORIDE 0.9 % IV SOLN
1.0000 mL/kg/h | INTRAVENOUS | Status: AC
  Administered 2012-03-07 (×2): 1 mL/kg/h via INTRAVENOUS

## 2012-03-07 MED ORDER — SODIUM CHLORIDE 0.9 % IJ SOLN: 3.0000 mL | Freq: Two times a day (BID) | INTRAMUSCULAR | Status: DC

## 2012-03-07 MED ORDER — SODIUM CHLORIDE 0.9 % IV SOLN: 250.0000 mL | INTRAVENOUS | Status: DC

## 2012-03-07 MED ORDER — ACETAMINOPHEN 325 MG PO TABS: 650.0000 mg | ORAL_TABLET | ORAL | Status: DC | PRN

## 2012-03-07 MED ORDER — FENTANYL CITRATE 0.05 MG/ML IJ SOLN
INTRAMUSCULAR | Status: AC
  Filled 2012-03-07: qty 2

## 2012-03-07 MED ORDER — BIVALIRUDIN 250 MG IV SOLR
INTRAVENOUS | Status: AC
  Filled 2012-03-07: qty 250

## 2012-03-07 MED ORDER — SODIUM CHLORIDE 0.9 % IJ SOLN: 3.0000 mL | INTRAMUSCULAR | Status: DC | PRN

## 2012-03-07 MED ORDER — ONDANSETRON HCL 4 MG/2ML IJ SOLN: 4.0000 mg | Freq: Four times a day (QID) | INTRAMUSCULAR | Status: DC | PRN

## 2012-03-07 MED ORDER — NITROGLYCERIN 0.2 MG/ML ON CALL CATH LAB
INTRAVENOUS | Status: AC
  Filled 2012-03-07: qty 1

## 2012-03-07 MED ORDER — LIDOCAINE HCL (PF) 1 % IJ SOLN
INTRAMUSCULAR | Status: AC
  Filled 2012-03-07: qty 30

## 2012-03-07 MED ORDER — VERAPAMIL HCL 2.5 MG/ML IV SOLN
INTRAVENOUS | Status: AC
  Filled 2012-03-07: qty 2

## 2012-03-07 NOTE — Progress Notes (Signed)
TR BAND REMOVAL  LOCATION:    left radial  DEFLATED PER PROTOCOL:    yes  TIME BAND OFF / DRESSING APPLIED:    2130  SITE UPON ARRIVAL:    Level 0  SITE AFTER BAND REMOVAL:    Level 0  REVERSE ALLEN'S TEST:     positive  CIRCULATION SENSATION AND MOVEMENT:    Within Normal Limits   yes  COMMENTS:   Post radial cath instructions reviewed verbally, questions answered, instruction sheet given.  Pt voices understanding.

## 2012-03-07 NOTE — Interval H&P Note (Signed)
History and Physical Interval Note:  03/07/2012 3:58 PM  Clifford Jones  has presented today for surgery, with the diagnosis of blockage  The various methods of treatment have been discussed with the patient and family. After consideration of risks, benefits and other options for treatment, the patient has consented to  Procedure(s) (LRB) with comments: PERCUTANEOUS CORONARY STENT INTERVENTION (PCI-S) (N/A) as a surgical intervention .  The patient's history has been reviewed, patient examined, no change in status, stable for surgery.  I have reviewed the patient's chart and labs.  Questions were answered to the patient's satisfaction.     Tonny Bollman

## 2012-03-07 NOTE — H&P (View-Only) (Signed)
All set for PCI today.  I reviewed with him the strategy, and discussed the risks, and alternatives of redo CABG and medical therapy.  I also discussed with him the increased risk of periprocedural MI.  We have changed his platelet inhibitor due to clopidogrel hyporesponsiveness, and he should remain at least in the short term on more enhanced platelet inhibition.  He is agreeable.   

## 2012-03-07 NOTE — Progress Notes (Signed)
All set for PCI today.  I reviewed with him the strategy, and discussed the risks, and alternatives of redo CABG and medical therapy.  I also discussed with him the increased risk of periprocedural MI.  We have changed his platelet inhibitor due to clopidogrel hyporesponsiveness, and he should remain at least in the short term on more enhanced platelet inhibition.  He is agreeable.

## 2012-03-07 NOTE — CV Procedure (Signed)
   CARDIAC CATH NOTE  Name: Clifford Jones MRN: [id number] DOB: 1951-06-03  Procedure: PTCA and stenting of the SVG-OM  Indication: NSTEMI  Procedural Details: The left wrist was prepped, draped, and anesthetized with 1% lidocaine. Using the modified Seldinger technique, a 6 Fr sheath was introduced into the radial artery. 3 mg verapamil was administered through the radial sheath. Weight-based bivalirudin was given for anticoagulation. The patient had been adequately preloaded with Effient. Once a therapeutic ACT was achieved, a 6 [place] AL-2 guide catheter was inserted.   There were 2 separate severe lesions in a heavily stented, degenerated SVG-OM. The first was an ostial 95% de-novo lesion and the second was an eccentric 95% ISR lesion in the mid-body of the graft.  A FilterWire coronary guidewire was used to cross both lesions. I was unable to treat the more distal lesion due to proximity to the EPD. The ostial lesion was predilated with a 3.0x15 mm balloon.  The lesion was then stented with a 3.5x15 mm Xience Xpedition drug-eluting stent deployed at 14 atm. The FilterWire was then retrieved and the graft was rewired with a Cougar guidewire. IC verapamil was administered through the guide catheter.  The mid-lesion was then dilated with a 3.0 balloon followed by a 3.5 mm [place] Apex to 12 atm on 2 inflations.  Following PCI, there was 0% residual stenosis and TIMI-3 flow at both lesion sites. Final angiography confirmed an excellent result. The patient tolerated the procedure well. There were no immediate procedural complications. A TR band was used for radial hemostasis. The patient was transferred to the post catheterization recovery area for further monitoring.  Lesion 1 Data: Vessel: SVG-OM/ostial Percent stenosis (pre): 95 TIMI-flow (pre):  3 Stent:  3.5 x 15 mm Xience DES Percent stenosis (post): 0 TIMI-flow (post): 3  Lesion 2 Data: Vessel: SVG-OM/mid-body (ISR) Percent stenosis (pre):  95 TIMI-flow (pre):  3 Stent:  none Percent stenosis (post): 0 TIMI-flow (post): 3  Conclusions: Successful SVG PCI   Recommendations: Long-term DAPT (minimum 12 months)  Tonny Bollman 03/07/2012, 5:24 PM

## 2012-03-07 NOTE — Progress Notes (Deleted)
ANTICOAGULATION CONSULT NOTE - Follow Up Consult  Pharmacy Consult for Heparin Indication: s/p cath awaiting tx plan  No Known Allergies  Patient Measurements: Height: 6' (182.9 cm) Weight: 193 lb 5.5 oz (87.7 kg) IBW/kg (Calculated) : 77.6  Heparin Dosing Weight: 87.7kg  Vital Signs: Temp: 97.5 F (36.4 C) (09/18 1200) Temp src: Oral (09/18 1200) BP: 143/83 mmHg (09/18 1200) Pulse Rate: 47  (09/18 1200)  Labs:  Alvira Philips 03/07/12 0945 03/07/12 0535 03/06/12 2114 03/06/12 1223 03/06/12 0250 03/05/12 0655 03/05/12 0515 03/04/12 2349 03/04/12 2134  HGB -- 13.3 -- 14.0 -- -- -- -- --  HCT -- 39.0 -- 40.5 39.6 -- -- -- --  PLT -- 212 -- 206 203 -- -- -- --  APTT -- -- -- -- -- -- -- -- --  LABPROT -- -- -- -- -- -- -- -- --  INR -- -- -- -- -- -- -- -- --  HEPARINUNFRC 0.17* -- 0.30 <0.10* -- -- -- -- --  CREATININE -- -- -- 0.73 0.77 -- -- -- 0.90  CKTOTAL -- -- -- -- -- 70 -- -- --  CKMB -- -- -- -- -- 6.5* -- -- --  TROPONINI -- -- -- -- -- -- 0.69* 0.87* --    Estimated Creatinine Clearance: 109.1 ml/min (by C-G formula based on Cr of 0.73).   Medications:  Scheduled:    . aspirin EC  81 mg Oral Daily  . atorvastatin  80 mg Oral q1800  . diazepam  5 mg Oral On Call  . metoprolol tartrate  25 mg Oral BID  . pantoprazole  40 mg Oral Q1200  . prasugrel  10 mg Oral Pre-Cath  . prasugrel  60 mg Oral Once  . sodium chloride  3 mL Intravenous Q12H  . sodium chloride  3 mL Intravenous Q12H    Assessment: 61yo male for PCI today. Heparin level is now subtherapeutic at 0.17. Previously therapeutic (0.30) last night on current rate.  Goal of Therapy:  Heparin level 0.3-0.7 units/ml Monitor platelets by anticoagulation protocol: Yes   Plan:  Give 2500 units bolus x 1 Start heparin infusion at 2000 units/hr Check anti-Xa level in 6 hours and daily while on heparin Continue to monitor H&H and platelets  Monitor s/sx of bleeding  Tiney Rouge 03/07/2012,12:02  PM

## 2012-03-08 ENCOUNTER — Encounter (HOSPITAL_COMMUNITY): Payer: Self-pay | Admitting: Nurse Practitioner

## 2012-03-08 DIAGNOSIS — I214 Non-ST elevation (NSTEMI) myocardial infarction: Secondary | ICD-10-CM

## 2012-03-08 LAB — CBC
HCT: 41.4 % (ref 39.0–52.0)
Hemoglobin: 14.3 g/dL (ref 13.0–17.0)
MCV: 89.4 fL (ref 78.0–100.0)
RBC: 4.63 MIL/uL (ref 4.22–5.81)
RDW: 12.7 % (ref 11.5–15.5)
WBC: 12.3 10*3/uL — ABNORMAL HIGH (ref 4.0–10.5)

## 2012-03-08 LAB — BASIC METABOLIC PANEL
Chloride: 100 mEq/L (ref 96–112)
GFR calc non Af Amer: >90 mL/min (ref >90)
Glucose, Bld: 117 mg/dL — ABNORMAL HIGH (ref 70–99)
Potassium: 4.4 mEq/L (ref 3.5–5.1)
Sodium: 137 mEq/L (ref 135–145)

## 2012-03-08 MED ORDER — NITROGLYCERIN 0.4 MG SL SUBL: 0.4000 mg | SUBLINGUAL_TABLET | SUBLINGUAL | Status: DC | PRN

## 2012-03-08 MED ORDER — METOPROLOL TARTRATE 25 MG PO TABS: 25.0000 mg | ORAL_TABLET | Freq: Two times a day (BID) | ORAL | Status: DC

## 2012-03-08 MED ORDER — PRASUGREL HCL 10 MG PO TABS: 10.0000 mg | ORAL_TABLET | Freq: Every day | ORAL | Status: DC

## 2012-03-08 MED ORDER — SIMVASTATIN 40 MG PO TABS: 40.0000 mg | ORAL_TABLET | Freq: Every evening | ORAL | Status: DC

## 2012-03-08 MED ORDER — ASPIRIN 81 MG PO TABS: 81.0000 mg | ORAL_TABLET | Freq: Every day | ORAL | Status: DC

## 2012-03-08 MED FILL — Dextrose Inj 5%: INTRAVENOUS | Qty: 1000 | Status: AC

## 2012-03-08 NOTE — Discharge Summary (Signed)
Patient ID: Clifford Jones,  MRN: [id number], DOB/AGE: 1951-05-03 61 y.o.  Admit date: 03/04/2012 Discharge date: 03/08/2012  Primary Care Provider: Selinda Flavin Primary Cardiologist: T. Riley Kill, MD  Discharge Diagnoses Principal Problem:  *NSTEMI (non-ST elevated myocardial infarction)  **s/p PCI/DES of the VG->OM this admission Active Problems:  Hyperlipidemia  CAD, bypass graft  CAD (coronary artery disease)  Hypertensive heart disease  Personal history of noncompliance with medical treatment  S/P AAA repair  Peripheral vascular disease  Tobacco abuse  Allergies No Known Allergies  Procedures  Cardiac Catheterization 03/05/2012  Procedural Findings: Hemodynamics:  AO 154/79  (108) LV 157/12 No gradient on pullback              Coronary angiography: Coronary dominance: right  Left mainstem: 60% ostial narrowing.   Left anterior descending (LAD): The patient has about 10% distal LMCA narrowing, then 70% before the takeoff of the diagonal.  The diagonal is large and has 70-80% narrowing proximally, then a sub branch has a segmental 70-80% narrowing.  The LAD after the septal is subtotally occluded and has competitive filling distally.    Left circumflex (LCx): The CFX has diffuse plaque and provides a very small OM1, then is basically occluded distally.    Right coronary artery (RCA): Total occlusion proximally The LIMA to the LAD is widely patent.  After the insertion, there is about 70% segmental plaque leading to the distal LAD. The SVG to the diagonal system is now occluded.  In 2008, it was severely diseased.  The SVG to the large OM has been stented.  There are severe degenerative changes.  The ostial takeoff has 90% narrowing, then there are multiple stents with 90% in stent restenosis.  Distally the vessel goes into a small caliber OM vessel with 80 and 70% lesions.  The OM is smaller in caliber, being less than 2.65mm in size.   The RIMA to the distal RCA was non  selectively engaged. The RIMA appears to not have disease and fills in to the PLA area.  The PDA may be collateralized from the LIMA.    Left ventriculography: Overall EF is reduced at about 50%.  There is a focal area of basal hypokinesis in the mid inferior segment, ? Distribution of CFX.    _____________  Percutaneous Coronary Intervention 03/07/2012  Successful PCI and stenting of the ostial vein graft to the obtuse marginal with placement of a 3.5 x 15 mm Xience Xpedition DES.  The lesion in the mid-body of the graft was treated with PTCA only. _____________  History of Present Illness  61 y/o male with prior h/o CAD STATUS post coronary artery bypass grafting who was in his usual state of health until the morning of admission when he developed midsternal chest discomfort similar to prior angina and was taken to [facility] in [facility]. There, he was treated with baby aspirin, nitroglycerin, heparin, and morphine with eventual relief of symptoms. ECG did not show acute changes. Initial troponins were negative. He was transferred to [facility] for further evaluation.  Hospital Course  Patient ruled out for non-ST segment myocardial infarction, eventually peaking his CK at 70, MB at 6.5, and troponin I at 0.87. Decision was made to pursue diagnostic catheterization which was carried out on September 16 revealing in-stent restenosis within the vein graft to the obtuse marginal as well as a new ostial stenosis in the same graft. Additional details of his cath report are outlined above. The vein graft to  the diagonal was newly occluded. Films were reviewed and ultimately, decision was made to pursue PCI of the vein graft to the obtuse marginal. Patient return to the lab on September 18 and underwent successful PTCA of the mid body of the graft and drug-eluting stent placement in the ostial lesion. Patient tolerated procedure well and post procedure has been doing without recurrent  symptoms or limitations. It should be noted that do to hyporesponsiveness to Plavix with a P2Y12 of 258, he was instead loaded with Effient.  Case management has provided him with a 30 day effient card.  He will be discharged home today in good condition.  Discharge Vitals Blood pressure 175/81, pulse 62, temperature 98 F (36.7 C), temperature source Oral, resp. rate 20, height 6' (1.829 m), weight 187 lb 2.7 oz (84.9 kg), SpO2 95.00%.  Filed Weights   03/06/12 0500 03/07/12 0500 03/08/12 0111  Weight: 195 lb 4.8 oz (88.587 kg) 193 lb 5.5 oz (87.7 kg) 187 lb 2.7 oz (84.9 kg)   Labs  Lab Results  Component Value Date   CKTOTAL 70 03/05/2012   CKMB 6.5* 03/05/2012   TROPONINI 0.69* 03/05/2012    CBC  Basename 03/08/12 0420 03/07/12 0535  WBC 12.3* 8.3  NEUTROABS -- --  HGB 14.3 13.3  HCT 41.4 39.0  MCV 89.4 89.7  PLT 214 212   Basic Metabolic Panel  Basename 03/08/12 0420 03/06/12 1223  NA 137 137  K 4.4 3.8  CL 100 101  CO2 28 27  GLUCOSE 117* 90  BUN 14 13  CREATININE 0.84 0.73  CALCIUM 9.9 9.5  MG -- --  PHOS -- --   Liver Function Tests Lab Results  Component Value Date   ALT 11 03/04/2012   AST 15 03/04/2012   ALKPHOS 71 03/04/2012   BILITOT 0.3 03/04/2012   Fasting Lipid Panel Lab Results  Component Value Date   CHOL 170 03/05/2012   CHOL  Value: 231        ATP III CLASSIFICATION:  <200     mg/dL   Desirable  409-811  mg/dL   Borderline High  >=914    mg/dL   High* 78/29/5621   Lab Results  Component Value Date   HDL 33* 03/05/2012   HDL 35* 04/04/2007   Lab Results  Component Value Date   LDLCALC 82 03/05/2012   LDLCALC  Value: 143        Total Cholesterol/HDL:CHD Risk Coronary Heart Disease Risk Table                     Men   Women  1/2 Average Risk   3.4   3.3* 04/04/2007   Lab Results  Component Value Date   TRIG 276* 03/05/2012   TRIG 265* 04/04/2007   Lab Results  Component Value Date   CHOLHDL 5.2 03/05/2012   CHOLHDL 6.6 04/04/2007    Thyroid Function Tests Lab Results  Component Value Date   TSH 0.779 03/04/2012   Disposition  Pt is being discharged home today in good condition.  Follow-up Plans & Appointments      Follow-up Information    Follow up with Nicolasa Ducking, NP. On 03/13/2012. (12:15 PM)    Contact information:   [address] [place] [address] [phone]          Discharge Medications    Medication List     As of 03/08/2012 12:36 PM    STOP taking these  medications         clopidogrel 75 MG tablet   Commonly known as: PLAVIX      TAKE these medications         aspirin 81 MG tablet   Take 1 tablet (81 mg total) by mouth daily.      metoprolol tartrate 25 MG tablet   Commonly known as: LOPRESSOR   Take 1 tablet (25 mg total) by mouth 2 (two) times daily.      nitroGLYCERIN 0.4 MG SL tablet   Commonly known as: NITROSTAT   Place 1 tablet (0.4 mg total) under the tongue every 5 (five) minutes x 3 doses as needed for chest pain.      prasugrel 10 MG Tabs   Commonly known as: EFFIENT   Take 1 tablet (10 mg total) by mouth daily.      simvastatin 40 MG tablet   Commonly known as: ZOCOR   Take 1 tablet (40 mg total) by mouth every evening.       Outstanding Labs/Studies  None  Duration of Discharge Encounter   Greater than 30 minutes including physician time.  Signed, Nicolasa Ducking NP 03/08/2012, 12:36 PM

## 2012-03-08 NOTE — Progress Notes (Signed)
CARDIAC REHAB PHASE I   PRE:  Rate/Rhythm: 58 SB    BP: sitting 156/74    SaO2:   MODE:  Ambulation: 1000 ft   POST:  Rate/Rhythm: 73 SR    BP: sitting 175/81     SaO2:   Tolerated well. No c/o. Understands angina. Ed completed and pt thinking about quitting smoking, gave resources. Interested in [facility] and will send referral to [facility] in [place]. Discussed triglycerides and DM. Drinks many sugars. Encouraged to quit. [phone]  Harriet Masson CES, ACSM

## 2012-03-08 NOTE — Progress Notes (Signed)
    Subjective:  Feels good. No chest pain or dyspnea.  Objective:  Vital Signs in the last 24 hours: Temp:  [97.5 F (36.4 C)-98.4 F (36.9 C)] 98 F (36.7 C) (09/19 0853) Pulse Rate:  [47-62] 62  (09/19 0853) Resp:  [11-20] 20  (09/19 0853) BP: (127-190)/(68-90) 175/81 mmHg (09/19 0853) SpO2:  [95 %-98 %] 95 % (09/19 0853) Weight:  [84.9 kg (187 lb 2.7 oz)] 84.9 kg (187 lb 2.7 oz) (09/19 0111)  Intake/Output from previous day: 09/18 0701 - 09/19 0700 In: 2547.5 [P.O.:1240; I.V.:1307.5] Out: 1650 [Urine:1650]  Physical Exam: Pt is alert and oriented, NAD HEENT: normal Neck: JVP - normal Lungs: CTA bilaterally CV: RRR without murmur or gallop Abd: soft, NT, Positive BS, no hepatomegaly Ext: no C/C/E, distal pulses intact and equal. Left radial site clear Skin: warm/dry no rash   Lab Results:  Basename 03/08/12 0420 03/07/12 0535  WBC 12.3* 8.3  HGB 14.3 13.3  PLT 214 212    Basename 03/08/12 0420 03/06/12 1223  NA 137 137  K 4.4 3.8  CL 100 101  CO2 28 27  GLUCOSE 117* 90  BUN 14 13  CREATININE 0.84 0.73   No results found for this basename: TROPONINI:2,CK,MB:2 in the last 72 hours   Tele: Sinus brady no significant arrhythmia, personally reviewed.  EKG: sinus brady 55 bpm, otherwise WNL.  Assessment/Plan:  1. NSTEMI - now s/p SVG intervention. Continue ASA 81 mg and Effient 10 mg daily at least 12 months and favor long-term DAPT as tolerated. Continue metoprolol 25 mg BID.  2. Hyperlipidemia - continue home dose of simvastatin. He doesn't tolerate lipitor. Lipids from 9/16 reviewed and LDL was 82.  3. Tobacco. Cessation reviewed. I don't think he will quit. He understands implications of this.  Dispo: ok for discharge today. Follow-up in office 2 weeks.  Tonny Bollman, M.D. 03/08/2012, 10:53 AM

## 2012-03-13 ENCOUNTER — Encounter: Payer: Medicare Other | Admitting: Nurse Practitioner

## 2012-03-21 ENCOUNTER — Ambulatory Visit (INDEPENDENT_AMBULATORY_CARE_PROVIDER_SITE_OTHER): Payer: Medicare Other | Admitting: Physician Assistant

## 2012-03-21 ENCOUNTER — Encounter: Payer: Self-pay | Admitting: Physician Assistant

## 2012-03-21 VITALS — BP 160/100 | HR 61 | Ht 72.0 in | Wt 199.8 lb

## 2012-03-21 DIAGNOSIS — I10 Essential (primary) hypertension: Secondary | ICD-10-CM

## 2012-03-21 DIAGNOSIS — E785 Hyperlipidemia, unspecified: Secondary | ICD-10-CM

## 2012-03-21 DIAGNOSIS — I2510 Atherosclerotic heart disease of native coronary artery without angina pectoris: Secondary | ICD-10-CM

## 2012-03-21 MED ORDER — LISINOPRIL 20 MG PO TABS: 20.0000 mg | ORAL_TABLET | Freq: Every day | ORAL | Status: DC

## 2012-03-21 NOTE — Progress Notes (Signed)
[address] [place], [place]  [address] Phone: [phone] Fax:  [phone]  Date:  03/21/2012   Name:  Clifford Jones   DOB:  18-Aug-1950   MRN:  [id number]  PCP:  Selinda Flavin, MD  Primary Cardiologist:  Dr.  Shawnie Pons  Primary Electrophysiologist:  None    History of Present Illness: Clifford Jones is a 61 y.o. male who returns for post hospital followup.  Has a history of CAD, prior inferior infarct in 1999, status post CABG in 2003, status post DES x2 to the OM and 2008 in the setting of a non-STEMI, AAA, status post repair, HTN, HL, PAD.   He was admitted 9/15-9/19 with a non-STEMI. He initially presented to [facility] with chest discomfort. LHC demonstrated a newly occluded S-Dx but the culprit was a high grade lesion in the ostial S-OM and significant ISR in the mid body of the graft.  EF was preserved.  He underwent PCI with a Xience Xpedition DES to oS-OM and POBA to mid graft ISR. He was noted to be hyporesponsive to Plavix and was switched Effient.  Doing well since discharge. Denies chest pain, shortness of breath, syncope, orthopnea, PND or edema.  Labs (9/13):  K 3.9, creatinine 0.77, LDL 82, Hgb 14.3, TSH 0.779   Wt Readings from Last 3 Encounters:  03/21/12 199 lb 12.8 oz (90.629 kg)  03/08/12 187 lb 2.7 oz (84.9 kg)  03/08/12 187 lb 2.7 oz (84.9 kg)     Past Medical History  Diagnosis Date  . Hypertension   . Hyperlipidemia   . Coronary artery disease     a. 1999 Inf MI;  b. 2003 x 6;  c. 2008 NSTEMI, occluded SVG to marginal and diagonal system.2 DES placed to marginal system;  c. NSTEMI - LHC 03/05/12:  L-LAD ok with 70% after the insertion leading in dLAD, SVG-Dx new 100%, oS-OM 90%, multiple stents w/ 90% ISR, distal small w/ 80/70%, R-dRCA no significant disease, EF 50%. PCI 03/07/12 Xience Xpedition DES to oS-OM and POBA to mid lesion.    Marland Kitchen GERD (gastroesophageal reflux disease)   . S/P AAA repair   . Tobacco abuse      Current Outpatient Prescriptions  Medication Sig Dispense Refill  . aspirin 81 MG tablet Take 1 tablet (81 mg total) by mouth daily.      . metoprolol tartrate (LOPRESSOR) 25 MG tablet Take 1 tablet (25 mg total) by mouth 2 (two) times daily.  60 tablet  6  . nitroGLYCERIN (NITROSTAT) 0.4 MG SL tablet Place 1 tablet (0.4 mg total) under the tongue every 5 (five) minutes x 3 doses as needed for chest pain.  25 tablet  3  . prasugrel (EFFIENT) 10 MG TABS Take 1 tablet (10 mg total) by mouth daily.  30 tablet  6  . simvastatin (ZOCOR) 40 MG tablet Take 1 tablet (40 mg total) by mouth every evening.  30 tablet  6    Allergies: No Known Allergies  History  Substance Use Topics  . Smoking status: Current Every Day Smoker -- 1.0 packs/day for 45 years  . Smokeless tobacco: Not on file  . Alcohol Use: Yes     ROS:  Please see the history of present illness.     All other systems reviewed and negative.   PHYSICAL EXAM: VS:  BP 160/100  Pulse 61  Ht 6' (1.829 m)  Wt 199 lb 12.8 oz (90.629 kg)  BMI 27.10  kg/m2 Well nourished, well developed, in no acute distress HEENT: normal Neck: no JVD Cardiac:  normal S1, S2; RRR; no murmur Lungs:  clear to auscultation bilaterally, no wheezing, rhonchi or rales Abd: soft, nontender, no hepatomegaly Ext: no edema; left wrist without hematoma or bruit; left groin without hematoma or bruit  Skin: warm and dry Neuro:  CNs 2-12 intact, no focal abnormalities noted  EKG:  Sinus rhythm, heart rate 61, normal axis, nonspecific ST-T wave changes      ASSESSMENT AND PLAN:  1. Coronary Artery Disease: Doing well post non--STEMI treated with a DES to the SVG-OM.  We discussed the importance of dual antiplatelet therapy. He plans to pursue cardiac rehabilitation. Followup with me in 2 weeks as noted below. Followup with Dr. Riley Kill in 6-8 weeks.  2. Hypertension: Uncontrolled. Add lisinopril 20 mg daily. Check a basic metabolic panel when he sees me  back in 2 weeks.  3. Hyperlipidemia: Continue simvastatin. Check lipids and LFTs when he returns in 6-8 weeks to see Dr. Riley Kill.  4. Tobacco Abuse: He knows that he needs to quit.  Signed, Tereso Newcomer, PA-C  11:47 AM 03/21/2012

## 2012-03-21 NOTE — Patient Instructions (Addendum)
Your physician has recommended you make the following change in your medication: START LISINOPRIL 20 MG DAILY; PRESCRIPTION WAS SENT TO [facility]   Your physician recommends that you return for lab work in: 04/05/12 BMET  Your physician recommends that you schedule a follow-up appointment in: 2 WEEKS WITH SCOTT WEAVER, [facility]  Your physician recommends that you schedule a follow-up appointment in: 6-8 WEEKS WITH DR. Riley Kill WITH FASTING LIPID AND LIVER PANEL SAME DAY; IF NOT THEN COME IN THE DAY BEFORE FOR LAB WORK

## 2012-03-22 ENCOUNTER — Encounter: Payer: Self-pay | Admitting: Physician Assistant

## 2012-04-05 ENCOUNTER — Other Ambulatory Visit (INDEPENDENT_AMBULATORY_CARE_PROVIDER_SITE_OTHER): Payer: Medicare Other

## 2012-04-05 ENCOUNTER — Encounter: Payer: Self-pay | Admitting: Physician Assistant

## 2012-04-05 ENCOUNTER — Telehealth: Payer: Self-pay | Admitting: *Deleted

## 2012-04-05 ENCOUNTER — Ambulatory Visit (INDEPENDENT_AMBULATORY_CARE_PROVIDER_SITE_OTHER): Payer: Medicare Other | Admitting: Physician Assistant

## 2012-04-05 VITALS — BP 198/106 | HR 60 | Ht 72.0 in | Wt 197.4 lb

## 2012-04-05 DIAGNOSIS — I10 Essential (primary) hypertension: Secondary | ICD-10-CM

## 2012-04-05 DIAGNOSIS — R0989 Other specified symptoms and signs involving the circulatory and respiratory systems: Secondary | ICD-10-CM

## 2012-04-05 DIAGNOSIS — I2510 Atherosclerotic heart disease of native coronary artery without angina pectoris: Secondary | ICD-10-CM

## 2012-04-05 DIAGNOSIS — Z720 Tobacco use: Secondary | ICD-10-CM

## 2012-04-05 DIAGNOSIS — F17200 Nicotine dependence, unspecified, uncomplicated: Secondary | ICD-10-CM

## 2012-04-05 LAB — BASIC METABOLIC PANEL
BUN: 13 mg/dL (ref 6–23)
Creatinine, Ser: 0.9 mg/dL (ref 0.4–1.5)
GFR: 90.48 mL/min (ref >60.00)
Glucose, Bld: 109 mg/dL — ABNORMAL HIGH (ref 70–99)
Potassium: 4.4 mEq/L (ref 3.5–5.1)

## 2012-04-05 MED ORDER — LISINOPRIL 20 MG PO TABS: 20.0000 mg | ORAL_TABLET | Freq: Two times a day (BID) | ORAL | Status: DC

## 2012-04-05 MED ORDER — CARVEDILOL 6.25 MG PO TABS: 6.2500 mg | ORAL_TABLET | Freq: Two times a day (BID) | ORAL | Status: DC

## 2012-04-05 NOTE — Patient Instructions (Addendum)
Your physician has recommended you make the following change in your medication: STOP METOPROLOL START COREG 6.25 MG 1 TABLET TWICE DAILY INCREASE LISINOPRIL 20 MG 1 TABLET TWICE DAILY  Your physician recommends that you return for lab work in: TODAY BMET  YOU HAVE BEEN GIVEN A  PRESCRIPTION FOR BLOOD WORK AND BLOOD PRESSURE CHECK TO BE DONE IN 2 WEEKS WITH DR. Dimas Aguas WITH RESULTS FAXED  TO [place], PAC [phone]  KEEP APPOINTMENT WITH DR. Riley Kill 04/30/2012

## 2012-04-05 NOTE — Telephone Encounter (Signed)
Message copied by Tarri Fuller on Thu Apr 05, 2012  4:32 PM ------      Message from: [place], [place] T      Created: Thu Apr 05, 2012  2:01 PM       Labs ok      Continue with current treatment plan.      Tereso Newcomer, PA-C  2:01 PM 04/05/2012

## 2012-04-05 NOTE — Telephone Encounter (Signed)
Message copied by Tarri Fuller on Thu Apr 05, 2012  4:59 PM ------      Message from: [place], [place] T      Created: Thu Apr 05, 2012  2:01 PM       Labs ok      Continue with current treatment plan.      Tereso Newcomer, PA-C  2:01 PM 04/05/2012

## 2012-04-05 NOTE — Progress Notes (Signed)
[address] [place], [place]  [address] Phone: [phone] Fax:  [phone]  Date:  04/05/2012   Name:  Clifford Jones   DOB:  1950-10-11   MRN:  [id number]  PCP:  Selinda Flavin, MD  Primary Cardiologist:  Dr.  Shawnie Pons  Primary Electrophysiologist:  None    History of Present Illness: Clifford Jones is a 61 y.o. male who returns for follow up.  Has a history of CAD, Inf MI in 1999, s/p CABG in 2003, s/p DES x2 to the OM in 2008 in the setting of a non-STEMI, AAA, status post repair, HTN, HL, PAD.   Admitted 02/2012 with a non-STEMI.  LHC demonstrated a newly occluded S-Dx but the culprit was a high grade lesion in the ostial S-OM and significant ISR in the mid body of the graft.  EF was preserved.  He underwent PCI with a Xience Xpedition DES to oS-OM and POBA to mid graft ISR. He was noted to be hyporesponsive to Plavix and was switched Effient.  I saw him for followup 03/21/12. Blood pressure was markedly elevated. I placed him on lisinopril. Unfortunately, he has not taken his medicines yet today. He denies chest pain, shortness of breath, syncope, orthopnea, PND or edema.  Labs (9/13):  K 3.9, creatinine 0.77, LDL 82, Hgb 14.3, TSH 0.779   Wt Readings from Last 3 Encounters:  03/21/12 199 lb 12.8 oz (90.629 kg)  03/08/12 187 lb 2.7 oz (84.9 kg)  03/08/12 187 lb 2.7 oz (84.9 kg)     Past Medical History  Diagnosis Date  . Hypertension   . Hyperlipidemia   . Coronary artery disease     a. 1999 Inf MI;  b. 2003 x 6;  c. 2008 NSTEMI, occluded SVG to marginal and diagonal system.2 DES placed to marginal system;  c. NSTEMI - LHC 03/05/12:  L-LAD ok with 70% after the insertion leading in dLAD, SVG-Dx new 100%, oS-OM 90%, multiple stents w/ 90% ISR, distal small w/ 80/70%, R-dRCA no significant disease, EF 50%. PCI 03/07/12 Xience Xpedition DES to oS-OM and POBA to mid lesion.    Marland Kitchen GERD (gastroesophageal reflux disease)   . S/P AAA repair   .  Tobacco abuse     Current Outpatient Prescriptions  Medication Sig Dispense Refill  . aspirin 81 MG tablet Take 1 tablet (81 mg total) by mouth daily.      Marland Kitchen lisinopril (PRINIVIL,ZESTRIL) 20 MG tablet Take 1 tablet (20 mg total) by mouth daily.  90 tablet  3  . metoprolol tartrate (LOPRESSOR) 25 MG tablet Take 1 tablet (25 mg total) by mouth 2 (two) times daily.  60 tablet  6  . nitroGLYCERIN (NITROSTAT) 0.4 MG SL tablet Place 1 tablet (0.4 mg total) under the tongue every 5 (five) minutes x 3 doses as needed for chest pain.  25 tablet  3  . prasugrel (EFFIENT) 10 MG TABS Take 1 tablet (10 mg total) by mouth daily.  30 tablet  6  . simvastatin (ZOCOR) 40 MG tablet Take 1 tablet (40 mg total) by mouth every evening.  30 tablet  6    Allergies: No Known Allergies  History  Substance Use Topics  . Smoking status: Current Every Day Smoker -- 1.0 packs/day for 45 years  . Smokeless tobacco: Not on file  . Alcohol Use: Yes     ROS:  Please see the history of present illness.  He's had a nonproductive cough recently from  allergies.   All other systems reviewed and negative.   PHYSICAL EXAM: VS:  BP 198/106  Pulse 60  Ht 6' (1.829 m)  Wt 197 lb 6.4 oz (89.54 kg)  BMI 26.77 kg/m2 Well nourished, well developed, in no acute distress HEENT: normal Neck: no JVD Cardiac:  normal S1, S2; RRR; no murmur Lungs:  clear to auscultation bilaterally, no wheezing, rhonchi or rales Abd: soft, nontender, no hepatomegaly Ext: no edema Skin: warm and dry Neuro:  CNs 2-12 intact, no focal abnormalities noted  EKG:  Sinus rhythm, heart rate 60, normal axis, nonspecific ST-T wave changes      ASSESSMENT AND PLAN:  1. Coronary Artery Disease:  Stable. No angina. Continue aspirin and Effient. Followup with Dr. Riley Kill as scheduled.  2. Hypertension: Uncontrolled.  He has not taken his medicines yet today. He will take those in the office and we'll recheck his blood pressure prior to leaving. Stop  metoprolol. Start Coreg 6.25 mg twice daily. Increase lisinopril to 20 mg twice daily. Check a basic metabolic panel today. Check a blood pressure basic metabolic panel with his PCP in [place] in 2 weeks. He does snore. However, he denies daytime hypersomnolence. I have asked for him to get his wife to monitor his sleeping. If he has witnessed apneic episodes, we will need to arrange sleep study.  3. Hyperlipidemia: Continue simvastatin. Check lipids and LFTs when he returns to see Dr. Riley Kill.  4. Tobacco Abuse: He knows that he needs to quit.  Luna Glasgow, PA-C  8:33 AM 04/05/2012

## 2012-04-05 NOTE — Telephone Encounter (Signed)
pt notified about lab results, verbalized understanding  

## 2012-04-19 ENCOUNTER — Encounter: Payer: Self-pay | Admitting: Cardiology

## 2012-04-25 ENCOUNTER — Telehealth: Payer: Self-pay | Admitting: *Deleted

## 2012-04-25 NOTE — Telephone Encounter (Signed)
pt notified about lab results and to repeat bmet 11/7 due to elevated K+ 5.5 and to watch dietary K+ , pt verbalized understanding today

## 2012-04-26 ENCOUNTER — Other Ambulatory Visit: Payer: Medicare Other

## 2012-04-27 ENCOUNTER — Other Ambulatory Visit (INDEPENDENT_AMBULATORY_CARE_PROVIDER_SITE_OTHER): Payer: Medicare Other

## 2012-04-27 ENCOUNTER — Telehealth: Payer: Self-pay | Admitting: *Deleted

## 2012-04-27 DIAGNOSIS — I2510 Atherosclerotic heart disease of native coronary artery without angina pectoris: Secondary | ICD-10-CM

## 2012-04-27 DIAGNOSIS — I10 Essential (primary) hypertension: Secondary | ICD-10-CM

## 2012-04-27 LAB — BASIC METABOLIC PANEL
CO2: 29 mEq/L (ref 19–32)
Calcium: 9.3 mg/dL (ref 8.4–10.5)
Creatinine, Ser: 0.9 mg/dL (ref 0.4–1.5)
GFR: 92.81 mL/min (ref >60.00)
Glucose, Bld: 123 mg/dL — ABNORMAL HIGH (ref 70–99)

## 2012-04-27 NOTE — Telephone Encounter (Signed)
Message copied by Tarri Fuller on Fri Apr 27, 2012  2:33 PM ------      Message from: [place], [place] T      Created: Fri Apr 27, 2012  1:48 PM       [facility]      Continue with current treatment plan.      Tereso Newcomer, PA-C  1:47 PM 04/27/2012

## 2012-04-27 NOTE — Telephone Encounter (Signed)
pt notified about labs results, verbalized understanding

## 2012-04-30 ENCOUNTER — Ambulatory Visit (INDEPENDENT_AMBULATORY_CARE_PROVIDER_SITE_OTHER): Payer: Medicare Other | Admitting: Cardiology

## 2012-04-30 ENCOUNTER — Ambulatory Visit (INDEPENDENT_AMBULATORY_CARE_PROVIDER_SITE_OTHER)
Admission: RE | Admit: 2012-04-30 | Discharge: 2012-04-30 | Disposition: A | Discharge status: Home / Self Care | Payer: Medicare Other | Source: Ambulatory Visit | Attending: Cardiology | Admitting: Cardiology

## 2012-04-30 ENCOUNTER — Encounter: Payer: Self-pay | Admitting: Cardiology

## 2012-04-30 VITALS — BP 142/82 | HR 64 | Ht 72.0 in | Wt 197.0 lb

## 2012-04-30 DIAGNOSIS — I2510 Atherosclerotic heart disease of native coronary artery without angina pectoris: Secondary | ICD-10-CM

## 2012-04-30 DIAGNOSIS — F17200 Nicotine dependence, unspecified, uncomplicated: Secondary | ICD-10-CM

## 2012-04-30 DIAGNOSIS — E785 Hyperlipidemia, unspecified: Secondary | ICD-10-CM

## 2012-04-30 DIAGNOSIS — Z720 Tobacco use: Secondary | ICD-10-CM

## 2012-04-30 DIAGNOSIS — I25810 Atherosclerosis of coronary artery bypass graft(s) without angina pectoris: Secondary | ICD-10-CM

## 2012-04-30 DIAGNOSIS — I10 Essential (primary) hypertension: Secondary | ICD-10-CM

## 2012-04-30 LAB — LIPID PANEL
HDL: 34.5 mg/dL — ABNORMAL LOW (ref >39.00)
Triglycerides: 177 mg/dL — ABNORMAL HIGH (ref 0.0–149.0)
VLDL: 35.4 mg/dL (ref 0.0–40.0)

## 2012-04-30 LAB — HEPATIC FUNCTION PANEL
Albumin: 3.8 g/dL (ref 3.5–5.2)
Bilirubin, Direct: 0 mg/dL (ref 0.0–0.3)
Total Protein: 7.4 g/dL (ref 6.0–8.3)

## 2012-04-30 NOTE — Assessment & Plan Note (Signed)
Discussed the need to quit today.

## 2012-04-30 NOTE — Assessment & Plan Note (Signed)
We will recheck a lipid liver today. Adjustments will be made appropriately

## 2012-04-30 NOTE — Patient Instructions (Addendum)
Your physician recommends that you schedule a follow-up appointment in: 2 MONTHS  Your physician recommends that you have lab work today: LIPID and LIVER  Your physician recommends that you continue on your current medications as directed. Please refer to the Current Medication list given to you today.  A chest x-ray takes a picture of the organs and structures inside the chest, including the heart, lungs, and blood vessels. This test can show several things, including, whether the heart is enlarges; whether fluid is building up in the lungs; and whether pacemaker / defibrillator leads are still in place.

## 2012-04-30 NOTE — Assessment & Plan Note (Signed)
He's not  having chest pain, and is improved since he had intervention.  He will remain on dual antiplatelet therapy, and I've cautioned him against the risks of bleeding.

## 2012-04-30 NOTE — Progress Notes (Signed)
HPI:  Patient seen back in followup today. He says he feels a whole lot better. He's cut down on his smoking, but has been unable to quit completely. He did have an elevated potassium but when rechecked here was normal. He denies any ongoing chest pain. Lipid liver due to day. Overall, we went through what he had done, and I have reemphasized to him today the need to stop smoking given the status of his current bypass graft.  Current Outpatient Prescriptions  Medication Sig Dispense Refill  . aspirin 81 MG tablet Take 1 tablet (81 mg total) by mouth daily.      . carvedilol (COREG) 6.25 MG tablet Take 1 tablet (6.25 mg total) by mouth 2 (two) times daily with a meal.  60 tablet  11  . lisinopril (PRINIVIL,ZESTRIL) 20 MG tablet Take 20 mg by mouth daily.      . nitroGLYCERIN (NITROSTAT) 0.4 MG SL tablet Place 1 tablet (0.4 mg total) under the tongue every 5 (five) minutes x 3 doses as needed for chest pain.  25 tablet  3  . prasugrel (EFFIENT) 10 MG TABS Take 1 tablet (10 mg total) by mouth daily.  30 tablet  6  . simvastatin (ZOCOR) 40 MG tablet Take 1 tablet (40 mg total) by mouth every evening.  30 tablet  6  . [DISCONTINUED] lisinopril (PRINIVIL,ZESTRIL) 20 MG tablet Take 1 tablet (20 mg total) by mouth 2 (two) times daily.  60 tablet  11    No Known Allergies  Past Medical History  Diagnosis Date  . Hypertension   . Hyperlipidemia   . Coronary artery disease     a. 1999 Inf MI;  b. 2003 x 6;  c. 2008 NSTEMI, occluded SVG to marginal and diagonal system.2 DES placed to marginal system;  c. NSTEMI - LHC 03/05/12:  L-LAD ok with 70% after the insertion leading in dLAD, SVG-Dx new 100%, oS-OM 90%, multiple stents w/ 90% ISR, distal small w/ 80/70%, R-dRCA no significant disease, EF 50%. PCI 03/07/12 Xience Xpedition DES to oS-OM and POBA to mid lesion.    Marland Kitchen GERD (gastroesophageal reflux disease)   . S/P AAA repair   . Tobacco abuse     Past Surgical History  Procedure Date  . Abdominal  aortic aneurysm repair 2006  . Coronary artery bypass graft 2003  . Hernia repair     Family History  Problem Relation Age of Onset  . Coronary artery disease      History   Social History  . Marital Status: Single    Spouse Name: N/A    Number of Children: N/A  . Years of Education: N/A   Occupational History  . Not on file.   Social History Main Topics  . Smoking status: Current Every Day Smoker -- 1.0 packs/day for 45 years  . Smokeless tobacco: Not on file  . Alcohol Use: Yes  . Drug Use: No  . Sexually Active: Yes   Other Topics Concern  . Not on file   Social History Narrative   Disabled.  Former truck Software engineer. Divorced and remarried x 2    ROS: Please see the HPI.  All other systems reviewed and negative.  PHYSICAL EXAM:  BP 142/82  Pulse 64  Ht 6' (1.829 m)  Wt 197 lb (89.359 kg)  BMI 26.72 kg/m2  General: Well developed, well nourished, in no acute distress. Head:  Normocephalic and atraumatic. Neck: no JVD Lungs: Clear to auscultation  and percussion. Heart: Normal S1 and S2.  No murmur, rubs or gallops.  Pulses: Pulses normal in all 4 extremities. Extremities: No clubbing or cyanosis. No edema. Neurologic: Alert and oriented x 3.  EKG:  ASSESSMENT AND PLAN:  He has not had a CXR since 2006.

## 2012-05-10 ENCOUNTER — Other Ambulatory Visit: Payer: Self-pay

## 2012-05-10 DIAGNOSIS — E7800 Pure hypercholesterolemia, unspecified: Secondary | ICD-10-CM

## 2012-05-10 MED ORDER — ATORVASTATIN CALCIUM 80 MG PO TABS: 80.0000 mg | ORAL_TABLET | Freq: Every day | ORAL | Status: DC

## 2012-07-02 ENCOUNTER — Ambulatory Visit: Payer: Medicare Other | Admitting: Cardiology

## 2012-07-26 ENCOUNTER — Ambulatory Visit: Payer: Medicare Other | Admitting: Cardiology

## 2012-08-13 ENCOUNTER — Ambulatory Visit: Payer: Medicare Other | Admitting: Cardiology

## 2013-03-08 ENCOUNTER — Other Ambulatory Visit: Payer: Self-pay

## 2013-03-08 MED ORDER — PRASUGREL HCL 10 MG PO TABS: 10.0000 mg | ORAL_TABLET | Freq: Every day | ORAL | Status: DC

## 2013-05-30 ENCOUNTER — Other Ambulatory Visit: Payer: Self-pay | Admitting: Nurse Practitioner

## 2013-05-30 ENCOUNTER — Other Ambulatory Visit: Payer: Self-pay | Admitting: Cardiology

## 2013-10-21 ENCOUNTER — Emergency Department (HOSPITAL_COMMUNITY): Payer: Medicare Other

## 2013-10-21 ENCOUNTER — Encounter (HOSPITAL_COMMUNITY): Payer: Self-pay | Admitting: Emergency Medicine

## 2013-10-21 ENCOUNTER — Inpatient Hospital Stay (HOSPITAL_COMMUNITY)
Admission: EM | Admit: 2013-10-21 | Discharge: 2013-10-24 | DRG: 247 | Disposition: A | Discharge status: Home / Self Care | Payer: Medicare Other | Attending: Cardiovascular Disease | Admitting: Cardiovascular Disease

## 2013-10-21 DIAGNOSIS — I25810 Atherosclerosis of coronary artery bypass graft(s) without angina pectoris: Secondary | ICD-10-CM

## 2013-10-21 DIAGNOSIS — Z7982 Long term (current) use of aspirin: Secondary | ICD-10-CM

## 2013-10-21 DIAGNOSIS — T82897A Other specified complication of cardiac prosthetic devices, implants and grafts, initial encounter: Secondary | ICD-10-CM | POA: Diagnosis present

## 2013-10-21 DIAGNOSIS — Z9889 Other specified postprocedural states: Secondary | ICD-10-CM

## 2013-10-21 DIAGNOSIS — Z8249 Family history of ischemic heart disease and other diseases of the circulatory system: Secondary | ICD-10-CM

## 2013-10-21 DIAGNOSIS — I10 Essential (primary) hypertension: Secondary | ICD-10-CM

## 2013-10-21 DIAGNOSIS — F17200 Nicotine dependence, unspecified, uncomplicated: Secondary | ICD-10-CM | POA: Diagnosis present

## 2013-10-21 DIAGNOSIS — Y849 Medical procedure, unspecified as the cause of abnormal reaction of the patient, or of later complication, without mention of misadventure at the time of the procedure: Secondary | ICD-10-CM | POA: Diagnosis present

## 2013-10-21 DIAGNOSIS — Z951 Presence of aortocoronary bypass graft: Secondary | ICD-10-CM

## 2013-10-21 DIAGNOSIS — Z9119 Patient's noncompliance with other medical treatment and regimen: Secondary | ICD-10-CM

## 2013-10-21 DIAGNOSIS — K219 Gastro-esophageal reflux disease without esophagitis: Secondary | ICD-10-CM | POA: Diagnosis present

## 2013-10-21 DIAGNOSIS — I214 Non-ST elevation (NSTEMI) myocardial infarction: Secondary | ICD-10-CM

## 2013-10-21 DIAGNOSIS — E785 Hyperlipidemia, unspecified: Secondary | ICD-10-CM

## 2013-10-21 DIAGNOSIS — R079 Chest pain, unspecified: Secondary | ICD-10-CM

## 2013-10-21 DIAGNOSIS — Z720 Tobacco use: Secondary | ICD-10-CM

## 2013-10-21 DIAGNOSIS — I252 Old myocardial infarction: Secondary | ICD-10-CM

## 2013-10-21 DIAGNOSIS — Z8679 Personal history of other diseases of the circulatory system: Secondary | ICD-10-CM

## 2013-10-21 DIAGNOSIS — Z91199 Patient's noncompliance with other medical treatment and regimen due to unspecified reason: Secondary | ICD-10-CM

## 2013-10-21 DIAGNOSIS — R072 Precordial pain: Secondary | ICD-10-CM | POA: Diagnosis present

## 2013-10-21 DIAGNOSIS — I2510 Atherosclerotic heart disease of native coronary artery without angina pectoris: Secondary | ICD-10-CM

## 2013-10-21 DIAGNOSIS — I739 Peripheral vascular disease, unspecified: Secondary | ICD-10-CM

## 2013-10-21 DIAGNOSIS — Z9861 Coronary angioplasty status: Secondary | ICD-10-CM

## 2013-10-21 LAB — CBC
HCT: 43.5 % (ref 39.0–52.0)
HEMOGLOBIN: 15.3 g/dL (ref 13.0–17.0)
MCH: 31.2 pg (ref 26.0–34.0)
MCHC: 35.2 g/dL (ref 30.0–36.0)
MCV: 88.8 fL (ref 78.0–100.0)
Platelets: 288 10*3/uL (ref 150–400)
RBC: 4.9 MIL/uL (ref 4.22–5.81)
RDW: 12.8 % (ref 11.5–15.5)
WBC: 12.7 10*3/uL — ABNORMAL HIGH (ref 4.0–10.5)

## 2013-10-21 LAB — HEPATIC FUNCTION PANEL
ALT: 13 U/L (ref 0–53)
AST: 20 U/L (ref 0–37)
Albumin: 4.1 g/dL (ref 3.5–5.2)
Alkaline Phosphatase: 95 U/L (ref 39–117)
BILIRUBIN TOTAL: 0.4 mg/dL (ref 0.3–1.2)
Bilirubin, Direct: <0.2 mg/dL (ref 0.0–0.3)
Total Protein: 8.1 g/dL (ref 6.0–8.3)

## 2013-10-21 LAB — BASIC METABOLIC PANEL
BUN: 16 mg/dL (ref 6–23)
CO2: 28 meq/L (ref 19–32)
CREATININE: 1.08 mg/dL (ref 0.50–1.35)
Calcium: 10 mg/dL (ref 8.4–10.5)
Chloride: 98 mEq/L (ref 96–112)
GFR calc Af Amer: 84 mL/min — ABNORMAL LOW (ref >90)
GFR calc non Af Amer: 72 mL/min — ABNORMAL LOW (ref >90)
Glucose, Bld: 139 mg/dL — ABNORMAL HIGH (ref 70–99)
Potassium: 4.8 mEq/L (ref 3.7–5.3)
Sodium: 138 mEq/L (ref 137–147)

## 2013-10-21 LAB — PRO B NATRIURETIC PEPTIDE: Pro B Natriuretic peptide (BNP): 219.2 pg/mL — ABNORMAL HIGH (ref 0–125)

## 2013-10-21 LAB — TROPONIN I: Troponin I: <0.3 ng/mL (ref <0.30)

## 2013-10-21 MED ORDER — NITROGLYCERIN 0.4 MG SL SUBL: 0.4000 mg | SUBLINGUAL_TABLET | Freq: Once | SUBLINGUAL | Status: DC

## 2013-10-21 MED ORDER — MORPHINE SULFATE 4 MG/ML IJ SOLN
4.0000 mg | Freq: Once | INTRAMUSCULAR | Status: AC
  Administered 2013-10-21: 4 mg via INTRAVENOUS
  Filled 2013-10-21: qty 1

## 2013-10-21 MED ORDER — NITROGLYCERIN 2 % TD OINT
1.0000 [in_us] | TOPICAL_OINTMENT | Freq: Once | TRANSDERMAL | Status: AC
  Administered 2013-10-21: 1 [in_us] via TOPICAL
  Filled 2013-10-21: qty 1

## 2013-10-21 NOTE — ED Provider Notes (Signed)
CSN: [id number]     Arrival date & time 10/21/13  2142 History  This chart was scribed for Maudry Diego, MD by Jenne Campus, ED Scribe. This patient was seen in room APA01/APA01 and the patient's care was started at 10:07 PM.    Chief Complaint  Patient presents with  . Chest Pain     Patient is a 63 y.o. male presenting with chest pain. The history is provided by the patient. No language interpreter was used.  Chest Pain Pain location:  L chest Pain radiates to:  Does not radiate Duration:  1 hour Timing:  Constant Chronicity:  Recurrent Ineffective treatments:  Nitroglycerin Associated symptoms: headache   Associated symptoms: no abdominal pain, no back pain, no cough and no fatigue   Risk factors: coronary artery disease and smoking     HPI Comments: Clifford Jones is a 63 y.o. male with a h/o 5 prior MIs with stent placement who presents to the Emergency Department complaining of left-sided CP that started one hour ago. He reports taking 3 nitroglycerin without improvement.  He denies any diaphoresis or SOB. Pt states that last time his stents were evaluated were 2 years ago and his last Cardiologist appointment was two years ago. He denies having any CP episodes in that time. He states that the pain is similar but less severe prior to a stent placement. He reports that "one of my stents feels clogged". He is a current everyday smoker and is an alcohol user. He states that he takes ASA and an anticoagulant daily and denies any missed doses.   Past Medical History  Diagnosis Date  . Hypertension   . Hyperlipidemia   . Coronary artery disease     a. 1999 Inf MI;  b. 2003 x 6;  c. 2008 NSTEMI, occluded SVG to marginal and diagonal system.2 DES placed to marginal system;  c. NSTEMI - LHC 03/05/12:  L-LAD ok with 70% after the insertion leading in dLAD, SVG-Dx new 100%, oS-OM 90%, multiple stents w/ 90% ISR, distal small w/ 80/70%, R-dRCA no significant disease, EF 50%. PCI 03/07/12  Xience Xpedition DES to oS-OM and POBA to mid lesion.    Marland Kitchen GERD (gastroesophageal reflux disease)   . S/P AAA repair   . Tobacco abuse   . MI (myocardial infarction)    Past Surgical History  Procedure Laterality Date  . Abdominal aortic aneurysm repair  2006  . Coronary artery bypass graft  2003  . Hernia repair    . Coronary angioplasty with stent placement     Family History  Problem Relation Age of Onset  . Coronary artery disease     History  Substance Use Topics  . Smoking status: Current Every Day Smoker -- 1.00 packs/day for 45 years  . Smokeless tobacco: Not on file  . Alcohol Use: Yes    Review of Systems  Constitutional: Negative for appetite change and fatigue.  HENT: Positive for congestion. Negative for ear discharge and sinus pressure.   Eyes: Negative for discharge.  Respiratory: Negative for cough.   Cardiovascular: Positive for chest pain.  Gastrointestinal: Negative for abdominal pain and diarrhea.  Genitourinary: Negative for frequency and hematuria.  Musculoskeletal: Negative for back pain.  Skin: Negative for rash.  Neurological: Positive for headaches. Negative for seizures.  Psychiatric/Behavioral: Negative for hallucinations.      Allergies  Review of patient's allergies indicates no known allergies.  Home Medications   Prior to Admission medications   Medication Sig  Start Date End Date Taking? Authorizing Provider  aspirin 81 MG tablet Take 1 tablet (81 mg total) by mouth daily. 03/08/12   Rogelia Mire, NP  atorvastatin (LIPITOR) 80 MG tablet TAKE 1 TABLET BY MOUTH EVERY DAY 05/30/13   Dorothy Spark, MD  carvedilol (COREG) 6.25 MG tablet Take 1 tablet (6.25 mg total) by mouth 2 (two) times daily with a meal. 04/05/12   Hillary Bow, MD  lisinopril (PRINIVIL,ZESTRIL) 20 MG tablet TAKE 1 TABLET BY MOUTH TWICE DAILY 05/30/13   Dorothy Spark, MD  NITROSTAT 0.4 MG SL tablet DISSOLVE 1 TABLET UNDER TONGUE EVERY 5 MINUTES UP TO 3  DOSES AS NEEDED FOR CHEST PAIN 05/30/13   Dorothy Spark, MD  prasugrel (EFFIENT) 10 MG TABS tablet Take 1 tablet (10 mg total) by mouth daily. 03/08/13   Fay Records, MD   Triage vitals: BP 209/113  Pulse 82  Temp(Src) 97.8 F (36.6 C) (Oral)  Resp 24  Ht 6' (1.829 m)  Wt 200 lb (90.719 kg)  BMI 27.12 kg/m2  SpO2 95%  Physical Exam  Nursing note and vitals reviewed. Constitutional: He is oriented to person, place, and time. He appears well-developed and well-nourished. No distress.  HENT:  Head: Normocephalic and atraumatic.  Eyes: Conjunctivae and EOM are normal. No scleral icterus.  Neck: Neck supple. No thyromegaly present.  Cardiovascular: Normal rate and regular rhythm.  Exam reveals no gallop and no friction rub.   No murmur heard. Pulmonary/Chest: Effort normal. No stridor. He has wheezes (mild wheezing bilaterally ). He has no rales. He exhibits no tenderness.  Abdominal: He exhibits no distension. There is no tenderness. There is no rebound.  Musculoskeletal: Normal range of motion. He exhibits no edema.  Lymphadenopathy:    He has no cervical adenopathy.  Neurological: He is alert and oriented to person, place, and time. He exhibits normal muscle tone. Coordination normal.  Skin: Skin is warm and dry. No rash noted. No erythema.  Psychiatric: He has a normal mood and affect. His behavior is normal.    ED Course  Procedures (including critical care time)  Medications  nitroGLYCERIN (NITROGLYN) 2 % ointment 1 inch (1 inch Topical Given 10/21/13 2221)  morphine 4 MG/ML injection 4 mg (4 mg Intravenous Given 10/21/13 2221)    DIAGNOSTIC STUDIES: Oxygen Saturation is 95% on RA, adequate by my interpretation.    COORDINATION OF CARE: 10:11 PM-Discussed treatment plan which includes medications, CXR, CBC panel and BMP with pt at bedside and pt agreed to plan.   Labs Review Labs Reviewed  TROPONIN I  CBC  BASIC METABOLIC PANEL    Imaging Review No results  found.   EKG Interpretation   Date/Time:  Monday Oct 21 2013 21:57:27 EDT Ventricular Rate:  82 PR Interval:  169 QRS Duration: 91 QT Interval:  385 QTC Calculation: 450 R Axis:   25 Text Interpretation:  Sinus rhythm Probable left atrial enlargement  Nonspecific repol abnormality, lateral leads Confirmed by Timouthy Gilardi  MD,  Faron Whitelock [id number]) on 10/21/2013 11:20:32 PM      MDM   Final diagnoses:  None    The chart was scribed for me under my direct supervision.  I personally performed the history, physical, and medical decision making and all procedures in the evaluation of this patient.Maudry Diego, MD 10/21/13 [phone]

## 2013-10-21 NOTE — ED Notes (Signed)
Patient complaining of left sided chest pain starting approximately 35 ago. Denies radiation or other symptoms. States "my heart hurts." States he took 3 nitro SL tablets with no relief.

## 2013-10-22 ENCOUNTER — Encounter (HOSPITAL_COMMUNITY)
Admission: EM | Disposition: A | Discharge status: Home / Self Care | Payer: Self-pay | Source: Home / Self Care | Attending: Cardiovascular Disease

## 2013-10-22 ENCOUNTER — Encounter (HOSPITAL_COMMUNITY): Payer: Self-pay | Admitting: General Practice

## 2013-10-22 DIAGNOSIS — R079 Chest pain, unspecified: Secondary | ICD-10-CM

## 2013-10-22 DIAGNOSIS — I2510 Atherosclerotic heart disease of native coronary artery without angina pectoris: Secondary | ICD-10-CM

## 2013-10-22 DIAGNOSIS — I25810 Atherosclerosis of coronary artery bypass graft(s) without angina pectoris: Secondary | ICD-10-CM

## 2013-10-22 DIAGNOSIS — R072 Precordial pain: Secondary | ICD-10-CM | POA: Diagnosis present

## 2013-10-22 HISTORY — PX: LEFT HEART CATHETERIZATION WITH CORONARY/GRAFT ANGIOGRAM: SHX5450

## 2013-10-22 LAB — BASIC METABOLIC PANEL
BUN: 16 mg/dL (ref 6–23)
CHLORIDE: 100 meq/L (ref 96–112)
CO2: 26 meq/L (ref 19–32)
Calcium: 9.4 mg/dL (ref 8.4–10.5)
Creatinine, Ser: 1.01 mg/dL (ref 0.50–1.35)
GFR calc Af Amer: >90 mL/min (ref >90)
GFR calc non Af Amer: 78 mL/min — ABNORMAL LOW (ref >90)
Glucose, Bld: 106 mg/dL — ABNORMAL HIGH (ref 70–99)
POTASSIUM: 4.2 meq/L (ref 3.7–5.3)
Sodium: 138 mEq/L (ref 137–147)

## 2013-10-22 LAB — CBC
HEMATOCRIT: 43.7 % (ref 39.0–52.0)
HEMOGLOBIN: 15 g/dL (ref 13.0–17.0)
MCH: 30.7 pg (ref 26.0–34.0)
MCHC: 34.3 g/dL (ref 30.0–36.0)
MCV: 89.4 fL (ref 78.0–100.0)
Platelets: 265 10*3/uL (ref 150–400)
RBC: 4.89 MIL/uL (ref 4.22–5.81)
RDW: 12.9 % (ref 11.5–15.5)
WBC: 9.5 10*3/uL (ref 4.0–10.5)

## 2013-10-22 LAB — TROPONIN I
TROPONIN I: 4.4 ng/mL — AB (ref <0.30)
TROPONIN I: 3.78 ng/mL — AB (ref <0.30)
Troponin I: 1.39 ng/mL (ref <0.30)

## 2013-10-22 LAB — LIPID PANEL
CHOLESTEROL: 192 mg/dL (ref 0–200)
HDL: 36 mg/dL — ABNORMAL LOW (ref >39)
LDL Cholesterol: 114 mg/dL — ABNORMAL HIGH (ref 0–99)
TRIGLYCERIDES: 208 mg/dL — AB (ref <150)
Total CHOL/HDL Ratio: 5.3 RATIO
VLDL: 42 mg/dL — ABNORMAL HIGH (ref 0–40)

## 2013-10-22 LAB — POCT ACTIVATED CLOTTING TIME: ACTIVATED CLOTTING TIME: 127 s

## 2013-10-22 SURGERY — LEFT HEART CATHETERIZATION WITH CORONARY/GRAFT ANGIOGRAM

## 2013-10-22 MED ORDER — ASPIRIN 81 MG PO CHEW
162.0000 mg | CHEWABLE_TABLET | ORAL | Status: AC
  Administered 2013-10-22: 162 mg via ORAL

## 2013-10-22 MED ORDER — SODIUM CHLORIDE 0.9 % IV SOLN: 1.0000 mL/kg/h | INTRAVENOUS | Status: AC

## 2013-10-22 MED ORDER — MORPHINE SULFATE 2 MG/ML IJ SOLN
2.0000 mg | INTRAMUSCULAR | Status: DC | PRN
  Administered 2013-10-22 (×2): 2 mg via INTRAVENOUS
  Filled 2013-10-22: qty 1

## 2013-10-22 MED ORDER — SODIUM CHLORIDE 0.9 % IJ SOLN: 3.0000 mL | INTRAMUSCULAR | Status: DC | PRN

## 2013-10-22 MED ORDER — SODIUM CHLORIDE 0.9 % IJ SOLN
3.0000 mL | Freq: Two times a day (BID) | INTRAMUSCULAR | Status: DC
  Administered 2013-10-22: 3 mL via INTRAVENOUS

## 2013-10-22 MED ORDER — HYDRALAZINE HCL 20 MG/ML IJ SOLN
INTRAMUSCULAR | Status: AC
  Filled 2013-10-22: qty 1

## 2013-10-22 MED ORDER — CARVEDILOL 12.5 MG PO TABS
12.5000 mg | ORAL_TABLET | Freq: Two times a day (BID) | ORAL | Status: DC
  Administered 2013-10-23 – 2013-10-24 (×3): 12.5 mg via ORAL
  Filled 2013-10-22 (×6): qty 1

## 2013-10-22 MED ORDER — CARVEDILOL 6.25 MG PO TABS
6.2500 mg | ORAL_TABLET | Freq: Two times a day (BID) | ORAL | Status: DC
  Administered 2013-10-22: 6.25 mg via ORAL
  Filled 2013-10-22 (×3): qty 1

## 2013-10-22 MED ORDER — FENOFIBRATE 160 MG PO TABS
160.0000 mg | ORAL_TABLET | Freq: Every day | ORAL | Status: DC
  Administered 2013-10-22 – 2013-10-24 (×3): 160 mg via ORAL
  Filled 2013-10-22 (×3): qty 1

## 2013-10-22 MED ORDER — STUDY - INVESTIGATIONAL DRUG SIMPLE RECORD
7.5000 mg | Freq: Two times a day (BID) | Status: DC
  Administered 2013-10-22 – 2013-10-24 (×5): 7.5 mg via ORAL
  Filled 2013-10-22 (×4): qty 7.5

## 2013-10-22 MED ORDER — FUROSEMIDE 10 MG/ML IJ SOLN
INTRAMUSCULAR | Status: AC
  Administered 2013-10-22: 40 mg
  Filled 2013-10-22: qty 4

## 2013-10-22 MED ORDER — HEPARIN SODIUM (PORCINE) 5000 UNIT/ML IJ SOLN
5000.0000 [IU] | Freq: Three times a day (TID) | INTRAMUSCULAR | Status: DC
  Administered 2013-10-22: 5000 [IU] via SUBCUTANEOUS
  Filled 2013-10-22 (×4): qty 1

## 2013-10-22 MED ORDER — HEPARIN (PORCINE) IN NACL 100-0.45 UNIT/ML-% IJ SOLN
1400.0000 [IU]/h | INTRAMUSCULAR | Status: DC
  Administered 2013-10-23: 02:00:00 1050 [IU]/h via INTRAVENOUS
  Filled 2013-10-22: qty 250

## 2013-10-22 MED ORDER — HEPARIN (PORCINE) IN NACL 100-0.45 UNIT/ML-% IJ SOLN
1050.0000 [IU]/h | INTRAMUSCULAR | Status: DC
  Administered 2013-10-22: 1050 [IU]/h via INTRAVENOUS
  Filled 2013-10-22: qty 250

## 2013-10-22 MED ORDER — HEPARIN (PORCINE) IN NACL 2-0.9 UNIT/ML-% IJ SOLN
INTRAMUSCULAR | Status: AC
  Filled 2013-10-22: qty 500

## 2013-10-22 MED ORDER — ASPIRIN EC 81 MG PO TBEC
162.0000 mg | DELAYED_RELEASE_TABLET | Freq: Every day | ORAL | Status: DC
  Administered 2013-10-23 – 2013-10-24 (×2): 162 mg via ORAL
  Filled 2013-10-22 (×2): qty 2

## 2013-10-22 MED ORDER — FUROSEMIDE 10 MG/ML IJ SOLN: 40.0000 mg | Freq: Once | INTRAMUSCULAR | Status: AC

## 2013-10-22 MED ORDER — ASPIRIN EC 81 MG PO TBEC
162.0000 mg | DELAYED_RELEASE_TABLET | Freq: Every day | ORAL | Status: DC
  Filled 2013-10-22: qty 2

## 2013-10-22 MED ORDER — PRASUGREL HCL 10 MG PO TABS
10.0000 mg | ORAL_TABLET | Freq: Every day | ORAL | Status: DC
  Administered 2013-10-22 – 2013-10-24 (×3): 10 mg via ORAL
  Filled 2013-10-22 (×3): qty 1

## 2013-10-22 MED ORDER — RANOLAZINE ER 500 MG PO TB12
500.0000 mg | ORAL_TABLET | Freq: Two times a day (BID) | ORAL | Status: DC
  Administered 2013-10-22 – 2013-10-24 (×4): 500 mg via ORAL
  Filled 2013-10-22 (×5): qty 1

## 2013-10-22 MED ORDER — NITROGLYCERIN 0.2 MG/ML ON CALL CATH LAB
INTRAVENOUS | Status: AC
  Filled 2013-10-22: qty 1

## 2013-10-22 MED ORDER — MORPHINE SULFATE 2 MG/ML IJ SOLN
INTRAMUSCULAR | Status: AC
  Filled 2013-10-22: qty 1

## 2013-10-22 MED ORDER — HEPARIN (PORCINE) IN NACL 2-0.9 UNIT/ML-% IJ SOLN
INTRAMUSCULAR | Status: AC
  Filled 2013-10-22: qty 1000

## 2013-10-22 MED ORDER — MIDAZOLAM HCL 2 MG/2ML IJ SOLN
INTRAMUSCULAR | Status: AC
  Filled 2013-10-22: qty 2

## 2013-10-22 MED ORDER — LISINOPRIL 20 MG PO TABS
20.0000 mg | ORAL_TABLET | Freq: Every day | ORAL | Status: DC
  Administered 2013-10-22 – 2013-10-24 (×3): 20 mg via ORAL
  Filled 2013-10-22 (×3): qty 1

## 2013-10-22 MED ORDER — SODIUM CHLORIDE 0.9 % IV SOLN: INTRAVENOUS | Status: DC

## 2013-10-22 MED ORDER — SODIUM CHLORIDE 0.9 % IV SOLN
250.0000 mL | INTRAVENOUS | Status: DC | PRN
  Administered 2013-10-22: 250 mL via INTRAVENOUS

## 2013-10-22 MED ORDER — NITROGLYCERIN IN D5W 200-5 MCG/ML-% IV SOLN
INTRAVENOUS | Status: AC
Start: 2013-10-22 — End: 2013-10-22
  Filled 2013-10-22: qty 250

## 2013-10-22 MED ORDER — LIDOCAINE HCL (PF) 1 % IJ SOLN
INTRAMUSCULAR | Status: AC
  Filled 2013-10-22: qty 30

## 2013-10-22 MED ORDER — NITROGLYCERIN 0.4 MG SL SUBL: 0.4000 mg | SUBLINGUAL_TABLET | SUBLINGUAL | Status: DC | PRN

## 2013-10-22 MED ORDER — ASPIRIN 81 MG PO CHEW: 81.0000 mg | CHEWABLE_TABLET | ORAL | Status: DC

## 2013-10-22 MED ORDER — FENTANYL CITRATE 0.05 MG/ML IJ SOLN
INTRAMUSCULAR | Status: AC
  Filled 2013-10-22: qty 2

## 2013-10-22 MED ORDER — NITROGLYCERIN IN D5W 200-5 MCG/ML-% IV SOLN
20.0000 ug/min | INTRAVENOUS | Status: DC
  Administered 2013-10-22: 20 ug/min via INTRAVENOUS
  Filled 2013-10-22: qty 250

## 2013-10-22 MED ORDER — PNEUMOCOCCAL VAC POLYVALENT 25 MCG/0.5ML IJ INJ
0.5000 mL | INJECTION | INTRAMUSCULAR | Status: AC
  Administered 2013-10-23: 0.5 mL via INTRAMUSCULAR
  Filled 2013-10-22 (×2): qty 0.5

## 2013-10-22 NOTE — Progress Notes (Signed)
CRITICAL VALUE ALERT  Critical value received:  Troponin 4.40   Date of notification:  10/22/2013  Time of notification:  0800  Critical value read back:yes  Nurse who received alert:  Lind Covert  MD notified (1st page):  Tarri Fuller PA  Time of first page:  0805  MD notified (2nd page):  Time of second page:  Responding MD:  Tarri Fuller  Time MD responded:  [place]

## 2013-10-22 NOTE — Progress Notes (Signed)
Subjective: No more CP.  No SOB  Objective: Vital signs in last 24 hours: Temp:  [97.7 F (36.5 C)-98.6 F (37 C)] 98.3 F (36.8 C) (05/05 0546) Pulse Rate:  [76-82] 76 (05/05 0546) Resp:  [11-24] 18 (05/05 0546) BP: (122-209)/(58-113) 155/85 mmHg (05/05 0546) SpO2:  [92 %-98 %] 94 % (05/05 0546) Weight:  [186 lb 6.4 oz (84.55 kg)-200 lb (90.719 kg)] 186 lb 6.4 oz (84.55 kg) (05/05 0145) Last BM Date: 10/21/13  Intake/Output from previous day: 05/04 0701 - 05/05 0700 In: -  Out: 150 [Urine:150] Intake/Output this shift:    Medications Current Facility-Administered Medications  Medication Dose Route Frequency Provider Last Rate Last Dose  . aspirin EC tablet 162 mg  162 mg Oral Daily Mian A Yousuf, MD      . carvedilol (COREG) tablet 6.25 mg  6.25 mg Oral BID WC Mian A Yousuf, MD      . heparin injection 5,000 Units  5,000 Units Subcutaneous 3 times per day Grafton Folk, MD   5,000 Units at 10/22/13 [phone]  . lisinopril (PRINIVIL,ZESTRIL) tablet 20 mg  20 mg Oral Daily Grafton Folk, MD      . nitroGLYCERIN (NITROSTAT) SL tablet 0.4 mg  0.4 mg Sublingual Once Maudry Diego, MD      . nitroGLYCERIN (NITROSTAT) SL tablet 0.4 mg  0.4 mg Sublingual Q5 min PRN Grafton Folk, MD      . Derrill Memo ON 10/23/2013] pneumococcal 23 valent vaccine (PNU-IMMUNE) injection 0.5 mL  0.5 mL Intramuscular Tomorrow-1000 Grafton Folk, MD      . prasugrel (EFFIENT) tablet 10 mg  10 mg Oral Daily Grafton Folk, MD        PE: General appearance: alert, cooperative and no distress Lungs: clear to auscultation bilaterally Heart: regular rate and rhythm, S1, S2 normal, no murmur, click, rub or gallop Extremities: No LEE Pulses: 2+ and symmetric Skin: Warm and dry Neurologic: Grossly normal  Lab Results:   Recent Labs  10/21/13 2205 10/22/13 0622  WBC 12.7* 9.5  HGB 15.3 15.0  HCT 43.5 43.7  PLT 288 265   BMET  Recent Labs  10/21/13 2205 10/22/13 0622  NA 138 138  K 4.8 4.2  CL  98 100  CO2 28 26  GLUCOSE 139* 106*  BUN 16 16  CREATININE 1.08 1.01  CALCIUM 10.0 9.4   PT/INR No results found for this basename: LABPROT, INR,  in the last 72 hours Cholesterol  Recent Labs  10/22/13 0622  CHOL 192   Lipid Panel     Component Value Date/Time   CHOL 192 10/22/2013 0622   TRIG 208* 10/22/2013 0622   HDL 36* 10/22/2013 0622   CHOLHDL 5.3 10/22/2013 0622   VLDL 42* 10/22/2013 0622   LDLCALC 114* 10/22/2013 0622    Cardiac Panel (last 3 results)  Recent Labs  10/21/13 2205 10/22/13 0622  TROPONINI <0.30 4.40*      Assessment/Plan  63 with hx of CAD s/p CABG and last PCI in 2013, HTN , HLD , AAA repair, smoker here with chest pain  Pt states that he was riding his tractor when he started having his typical chest pain that is described as left sided pressure , non radiating with no other ass symptoms. Pt took his SL NTG that did not completely alleviate his pain which is why he decided to come into the ER. Pt denies any Pt denies any SOB , orthopnea, PND ,  LE edema , Syncope ,claudcation , focal weakness, or bleeding diathesis .  Pt continues to smoke half pack per day . Has not seen his cardiologist since 04/2012.  Does report compliance with his medications   Principal Problem:   NSTEMI (non-ST elevated myocardial infarction) Troponin up to 4.40.  IV heparin added.  He is now on the cath schedule for today.  Active Problems:   Unstable angina  Pain resolved    Hyperlipidemia Atherosclerotic lipid panel. Uncontrolled.  Not on a statin due to muscle aches.  We discussed dietary changes but this will need to be reinforced.  Will add fenofibrate for now.     CAD (coronary artery disease)   s/p CABG and last PCI in 2013    Hypertension  Elevated.  On coreg 6.25 and lisinopril 20.  Increasing coreg to 12.5 BID.      Tobacco abuse  Discussed tobacco cessation.    LOS: 1 day    Tarri Fuller PA-C 10/22/2013 8:22 AM  Attending Note:   The patient was  seen and examined.  Agree with assessment and plan as noted above.  Changes made to the above note as needed.  He continues to smoke.    We had some discussion about this .    At his last cath, he was done from the left wrist.  He has a hx of AAA repair and there was some difficulty in getting up from the groin.   Comfortable at present   Ramond Dial., MD, [facility] 10/22/2013, 10:08 AM

## 2013-10-22 NOTE — CV Procedure (Signed)
CARDIAC CATHETERIZATION  REPORT  NAME:  [name]   MRN: [id number] DOB:  [birth year]   ADMIT DATE: 10/21/2013 Procedure Date: 10/22/2013  INTERVENTIONAL CARDIOLOGIST: Leonie Man, M.D., MS PRIMARY CARE PROVIDER: Rory Percy, MD PRIMARY CARDIOLOGIST: Formerly Dr. Lia Foyer - has not followed up with anyone since.  PATIENT:  Clifford Jones is a 63 y.o. male with significant severe diffuse CAD status post CABG in 2003.  He has had interventions on the vein graft to the OM on at least 2 occasions. His native arteries are extremely diseased both proximal and distal beyond the anastomoses.  He had done very well since his last PCI/PTCA of the SVG-OM (3.5 mm x 15 mm Xience Xpedition DES to the ostial lesion - post dilated to roughly 3.42mm) and PTCA of the 3 3.5 mm x 20 mm Liberte' BMS in the mid to distal graft (placed in 03/2007); however, he has continued to smoke and has not followed up with cardiology care. He presented to [facility] ER on 10/21/2013 after developing severe anginal chest pain while riding his tractor. He subsequently ruled in for a non-STEMI. He is now referred for invasive evaluation via Cardiac Catheterization with Native Coronary and Graft Angiography.  PRE-OPERATIVE DIAGNOSIS:    NSTEMI  KNOWN CAD -CABG; PCI TO SVG-OM  PROCEDURES PERFORMED:    LEFT HEART CATHETERIZATION WITH NATIVE CORONARY & GRAFT ANGIOGRAPHY  PROCEDURE:Consent:  Risks of procedure as well as the alternatives and risks of each were explained to the (patient/caregiver).  Consent for procedure obtained. Consent for signed by MD and patient with RN witness -- placed on chart.   PROCEDURE: The patient was brought to the [facility]nd [facility] Lab in the fasting state and prepped and draped in the usual sterile fashion for RIGHT & LEFT groin or radial access. A modified Allen's test with plethysmography was performed, revealing excellent Ulnar artery collateral flow.  Sterile  technique was used including antiseptics, cap, gloves, gown, hand hygiene, mask and sheet.  Skin prep: Chlorhexidine.  Time Out: Verified patient identification, verified procedure, site/side was marked, verified correct patient position, special equipment/implants available, medications/allergies/relevent history reviewed, required imaging and test results available.  Performed  Access:   RIGHT COMMON FEMORAL ARTERY: 5 Fr Sheath -- FLUROSCOPICALLY guided MODIFIED Seldinger technique; unable to pass catheter through native Aorta --> converted to L CFA  LEFT COMMON FEMORAL ARTERY: 5 Fr Sheath -- FLUROSCOPICALLY guided MODIFIED Seldinger technique  Diagnostic Left Heart Catheterization with Coronary and Graft Angiography:  Fr JR 4, JL4, LCB, IMA and angled pigtail catheters were advanced and exchanged over either a standard J-wire into the ascending aorta or long exchange wire subclavian arteries used for selective coronaryartery  and graft engagement.  Left Coronary Artery Angiography: JL4  Right Coronary Artery and SVG-OM Angiography: JR 4  LIMA-LAD: Using the LCB catheter with a Versicore wire, the catheter was advanced into the plating artery. Initially the catheter pulled back in the long exchange wire was then used to exchange for an IMA catheter; selective angiography was performed  RIMA-RCA: Unable to advance any catheter over a wire into the subclavian artery. The wire selectively entered the carotid artery and was not able to return into the density advance the catheter over the wire lid buckling of the catheter and inability to advance.  Nonselective images were performed with a tourniquet on the right upper arm demonstrating patent flow in the region of the unable to visualize the distal target.  LV  Hemodynamics (LV Gram): Angled pigtail  Both right and left femoral artery sheaths were sutured in place to be removed in the PACU holding area once blood pressure is down. They'll be  removed with manual compression held for hemostasis.  Hemodynamics:  Central Aortic / Mean Pressures: 173/67 mmHg; 118 mmHg  Left Ventricular Pressures / EDP:  187/14 mmHg; 24 mmHg  Left Ventriculography:  EF: 55 %  Wall Motion: Mild basilar inferior hypokinesis  Coronary and graft Anatomy: Right Dominant presumably  Left Main: Normal caliber vessel with Ostial 60% narrowing as well as roughly 40% distal narrowing as it bifurcates into the LAD and Circumflex. LAD: The residual LAD is moderate caliber with about 70% stenosis prior to takeoff of a small diagonal that has a roughly 70-80% proximal stenosis. One of this diagonal Obie Dredge is has another 70-80% stenosis. Both of these if not necessarily PCI amenable. Beyond the diagonal and another septal perforator, the LAD is subtotally occluded with competitive flow from LIMA-LAD.  LIMA-LAD: Widely patent, somewhat tortuous graft that inserts into the mid LAD. There is minimal retrograde flow up the LAD, however antegrade flow reveals a very small caliber LAD with a now 90% segmental lesion just distal to the anastomosis. The LAD itself does reach down around the apex.  SVG-Diagonal: Known to be occluded from 2013, confirmed today  Left Circumflex: What amounts to be a relatively smaller moderate caliber vessel with diffuse plaque body very small proximal OM. The vessel is an essentially occluded distally with trivial flow indicating the presence of another obtuse marginal.  SVG-OM: Large-caliber, grossly irregular graft with an ostial DES stent (02/2012) has a roughly 80% in-stent restenosis in the proximal two thirds of the stent.  The distal stent that was treated with PTCA in 02/2012 is widely patent. Downstream vessel is significantly disease is noted below, it does not perfuse a large territory.  Antegrade flow in the graft into the native circumflex shows a diffusely diseased degenerated native OM 2 with tandem 90 and 80% segmental  lesions followed by a diffusely diseased distal vessel.  Retrograde flow back up the AV groove circumflex leads to flow down a third marginal/LPL branch that is very small in caliber but without significant disease.    RCA: Known to be occluded  SVG-RCA: Nonselective imaging via subclavian angiography reveals that the graft itself is widely patent down to the target lesion, however flow in the target RCA was not adequately visualized.  After reviewing the initial angiography, the true culprit lesion was unclear. At this point since the [place] was not selectively engaged, the decision was made to abort the rest of procedure due to the length of fluoroscopy time and contrast exposure.  He will likely require a return to the Cath Lab for potential Right Radial Access to engage the Sublimity.  MEDICATIONS:  Anesthesia:  Local Lidocaine 15 ml for both groins  Sedation:  2 mg IV Versed, 50 mcg IV fentanyl ;   Omnipaque Contrast: 170 ml  IV hydralazine 20 mg; IV nitroglycerin infusion started at 30 mcg/min   PATIENT DISPOSITION:    The patient was transferred to the PACU holding area in a hemodynamicaly stable, chest pain free condition.  The patient tolerated the procedure well, and there were no complications.  EBL:   <  20 ml  The patient was stable before, during, and after the procedure.  POST-OPERATIVE DIAGNOSIS:    Severe native CAD with several potential culprit lesions for angina/non-STEMI, however the RIMA-RCA has  not been adequately visualized  Potential culprit lesions include: Ostial ISR of SVG-OM roughly 80% -the downstream graft is extremely disease with tandem 90 and 80% lesions in a very small vessel; not favorable for PCI Mid LAD beyond the LIMA insertion 90% subtotal occlusion - small caliber LAD, unfavorable PCI target  Native first diagonal branch roughly 80% - small caliber of possible PTCA target  Unable to visualize RIMA-RCA -- would likely need to use right radial  approach  Surprisingly well preserved LVEF with elevated LVEDP in the setting of systemic hypertension  Very tortuous aorta both in the abdominal and thoracic region. This includes extreme tortuosity of both the left subclavian and innominate artery on the right. Simply unable to advance a catheter into the innominate artery to the right subclavian.  Unable to advance catheters via right common femoral approach.   PLAN OF CARE:  The patient will return to the postprocedural unit for post catheterization care.  I have initiated IV nitroglycerin drip for hypertension and chest pain.  I will add Ranexa microvascular disease  I will ask my interventional partners to review angiographic images to determine if either PTCA of the ostial SVG-OM is reasonable, or if it is reasonable to attempt right radial access to engage the RIMA-RCA has not been adequately visualized.  I will restart heparin 8 hours post sheath removal.  For future catheterizations, would consider the possibility of using Left and Right Radial Access to allow for easier engagement of both IMA grafts as well as the remaining SVG-OM.    Leonie Man, M.D., M.S. [facility] [address] [place], [place]  [address]  [phone]  10/22/2013 5:01 PM

## 2013-10-22 NOTE — Interval H&P Note (Signed)
History and Physical Interval Note:  10/22/2013 2:33 PM  Clifford Jones  has presented today for surgery, with the diagnosis of NSTEMI.   The various methods of treatment have been discussed with the patient and family. After consideration of risks, benefits and other options for treatment, the patient has consented to  Procedure(s): LEFT HEART CATHETERIZATION WITH CORONARY ANGIOGRAM (N/A) +/- PCI  as a surgical intervention.    The patient's history has been reviewed, patient examined, no change in status, stable for surgery.  I have reviewed the patient's chart and labs.  Questions were answered to the patient's satisfaction.    Cath Lab Visit (complete for each Cath Lab visit)  Clinical Evaluation Leading to the Procedure:   ACS: yes  Non-ACS:    Anginal Classification: CCS III  Anti-ischemic medical therapy: Maximal Therapy (2 or more classes of medications)  Non-Invasive Test Results: No non-invasive testing performed  Prior CABG: Previous CABG   Clifford Jones

## 2013-10-22 NOTE — Brief Op Note (Addendum)
   Brief Cardiac Catheterization Report  10/21/2013 - 10/22/2013  7:24 PM  PATIENT:  Clifford Jones  63 y.o. male with history of CAD status post CABG in nearly 2000 as well as several PCI procedures the most recent being in September 2013 [place] Ball[place] PTCA to stent in SVG-OM as well as a DES stent to the ostium of the SVG-OM. Known occluded SVG-diagonal and native vessels. LIMA-LAD difficult to engage as well as RIMA- RCA.  He also is history of AAA repair, he continues to smoke. He was doing relatively well, up until the last day and presented with substernal chest pressure consistent with acute coronary syndrome. He ruled in for mild non-STEMI. Referred for cardiac catheterization.  PRE-OPERATIVE DIAGNOSIS:  Non-STEMI  POST-OPERATIVE DIAGNOSIS:    Potential culprit lesions include:   Ostial ISR of SVG-OM roughly 80% -the downstream graft is extremely disease with tandem 90 and 80% lesions in a very small vessel  Mid LAD beyond the LIMA insertion 90% subtotal occlusion  Native first diagonal branch roughly 80%  Unable to visualize RIMA-RCA -- would likely need to use right radial approach  Well-preserved LVEF mildly elevated LVEDP despite severe systemic hypertension  PROCEDURE:  Procedure(s): LEFT HEART CATHETERIZATION WITH CORONARY/GRAFT ANGIOGRAM  SURGEON:  Surgeon(s) and Role:    * Leonie Man, MD - Primary  ANESTHESIA:   local and IV sedation; 2 mg Versed, 50 mcg fentanyl  EBL:    < 30 mL  Left Heart Cath/Angiography:  Initially RFA access using fluoroscopic guided modified [place] S[place]inger technique. Unable to advance catheters via RFA do to AAA graft. Converted to LFA -- left, femoral 5 French sheath modified Seldinger technique fluoroscopically guided.  JR 4, JL4 and multiple other catheters including LCB, IMA catheters were used to engage the native and graft coronary arteries.  Multiple wires including Versicore, [place] glide wire, and long exchange J-wire were used  in attempt to engage the internal mammary artery graft.  Unable to access RIMA as the catheters would not advance into the the innominate artery.  Due to the length of procedure, amount of contrast and the patient's hypertension, I decided to abort the procedure after some RIMA angiography  MEDICATIONS USED: Hydralazine 20 mg IV; initiated nitroglycerin drip at 30 mcg/minute  SHEATHS:  Sutured in place in both the RFA and LFA - will be removed once blood pressures returned to normal  DICTATION: .Note written in [place]: Due to the need for nitroglycerin, he will go to [facility]. Would continue nitroglycerin overnight for chest pain and blood pressure control.  Only to review angiographic images with colleagues to determine if PTCA of the ostial SVG-OM is reasonable option based on the severity of the disease in the target vessel. It also need to consider right radial access to adequately engage RIMA  Restart IV heparin 8 hours post sheath removal.  Consider addition of Ranexa  PATIENT DISPOSITION:  PACU - hemodynamically stable. was still having chest pain, relieved with nitroglycerin and morphine    Leonie Man, M.D., M.S. Interventional Cardiologist  [place] Pager # [phone] 10/22/2013

## 2013-10-22 NOTE — Progress Notes (Signed)
ANTICOAGULATION CONSULT NOTE - Follow Up Consult  Pharmacy Consult:  Heparin Indication: chest pain/ACS s/p cath  No Known Allergies  Patient Measurements: Height: 6' (182.9 cm) Weight: 186 lb 6.4 oz (84.55 kg) IBW/kg (Calculated) : 77.6 Heparin Dosing Weight: 85 kg  Vital Signs: Temp: 97.5 F (36.4 C) (05/05 1407) Temp src: Oral (05/05 1407) BP: 109/60 mmHg (05/05 1407) Pulse Rate: 61 (05/05 1650)  Labs:  Recent Labs  10/21/13 2205 10/22/13 0622 10/22/13 0910  HGB 15.3 15.0  --   HCT 43.5 43.7  --   PLT 288 265  --   CREATININE 1.08 1.01  --   TROPONINI <0.30 4.40* 3.78*    Estimated Creatinine Clearance: 84.3 ml/min (by C-G formula based on Cr of 1.01).     Assessment: 68 YOM started on IV heparin for ACS.  Now s/p cath and IV heparin to resume 8 hrs post sheath removal.  Sheath was removed around 1815 and no bleeding nor hematoma reported by RN.   Goal of Therapy:  Heparin level 0.3-0.7 units/ml Monitor platelets by anticoagulation protocol: Yes    Plan:  - On 10/22/13 at 0215, resume heparin gtt at 1050 units/hr - Check 6 hr HL - Daily HL / CBC    Olia Hinderliter D. Mina Marble, PharmD, BCPS Pager:  [phone] 10/22/2013, 6:53 PM

## 2013-10-22 NOTE — Progress Notes (Signed)
UR Completed.  Diksha Tagliaferro Jane Sujata Maines [phone] 10/22/2013  

## 2013-10-22 NOTE — Plan of Care (Signed)
Problem: Phase I Progression Outcomes Goal: Aspirin unless contraindicated Outcome: Completed/Met Date Met:  10/22/13 Pt took ASA at home prior to arrival.

## 2013-10-22 NOTE — H&P (Signed)
History and Physical  Patient ID: Clifford Jones MRN: [id number], DOB: May 13, 1951 Date of Encounter: 10/22/2013, 2:54 AM Primary Physician: Rory Percy, MD Primary Cardiologist: Addison Lank   Chief Complaint: chest pain  Reason for Admission: chest pain   HPI: 81 with hx of CAD s/p CABG and last PCI in 2013, HTN , HLD ,  AAA repair, smoker here with chest pain   Pt states that he was riding his tractor when he started having his typical chest pain that is described as left sided pressure , non radiating with no other ass symptoms. Pt took his SL NTG that did not completely alleviate his pain which is why he decided to come into the ER. Pt denies any Pt denies any  SOB , orthopnea, PND , LE edema , Syncope ,claudcation , focal weakness, or bleeding diathesis .  Pt continues to smoke half pack per day . Has not seen his cardiologist since 04/2012.  Does report compliance with his medications     Past Medical History  Diagnosis Date  . Hypertension   . Hyperlipidemia   . Coronary artery disease     a. 1999 Inf MI;  b. 2003 x 6;  c. 2008 NSTEMI, occluded SVG to marginal and diagonal system.2 DES placed to marginal system;  c. NSTEMI - LHC 03/05/12:  L-LAD ok with 70% after the insertion leading in dLAD, SVG-Dx new 100%, oS-OM 90%, multiple stents w/ 90% ISR, distal small w/ 80/70%, R-dRCA no significant disease, EF 50%. PCI 03/07/12 Xience Xpedition DES to oS-OM and POBA to mid lesion.    Marland Kitchen GERD (gastroesophageal reflux disease)   . S/P AAA repair   . Tobacco abuse   . MI (myocardial infarction)       Surgical History:  Past Surgical History  Procedure Laterality Date  . Abdominal aortic aneurysm repair  2006  . Coronary artery bypass graft  2003  . Hernia repair    . Coronary angioplasty with stent placement       Home Meds: Prior to Admission medications   Medication Sig Start Date End Date Taking? Authorizing Provider  aspirin EC 81 MG tablet Take 162 mg by mouth daily.    Yes Historical Provider, MD  carvedilol (COREG) 6.25 MG tablet Take 1 tablet (6.25 mg total) by mouth 2 (two) times daily with a meal. 04/05/12  Yes Hillary Bow, MD  lisinopril (PRINIVIL,ZESTRIL) 20 MG tablet Take 20 mg by mouth daily.   Yes Historical Provider, MD  nitroGLYCERIN (NITROSTAT) 0.4 MG SL tablet Place 0.4 mg under the tongue every 5 (five) minutes as needed for chest pain.   Yes Historical Provider, MD  prasugrel (EFFIENT) 10 MG TABS tablet Take 1 tablet (10 mg total) by mouth daily. 03/08/13  Yes Fay Records, MD    Allergies: No Known Allergies  History   Social History  . Marital Status: Married    Spouse Name: N/A    Number of Children: N/A  . Years of Education: N/A   Occupational History  . Not on file.   Social History Main Topics  . Smoking status: Current Every Day Smoker -- 1.00 packs/day for 45 years  . Smokeless tobacco: Not on file  . Alcohol Use: 1.8 oz/week    3 Shots of liquor per week  . Drug Use: No  . Sexual Activity: Yes   Other Topics Concern  . Not on file   Social History Narrative   Disabled.  Former  truck Software engineer. Divorced and remarried x 2           Family History  Problem Relation Age of Onset  . Coronary artery disease      Review of Systems: General: negative for chills, fever, night sweats or weight changes.  Cardiovascular: per HPI  Dermatological: negative for rash Respiratory: negative for cough or wheezing Urologic: negative for hematuria Abdominal: negative for nausea, vomiting, diarrhea, bright red blood per rectum, melena, or hematemesis Neurologic: negative for visual changes, syncope, or dizziness All other systems reviewed and are otherwise negative except as noted above.  Labs:   Lab Results  Component Value Date   WBC 12.7* 10/21/2013   HGB 15.3 10/21/2013   HCT 43.5 10/21/2013   MCV 88.8 10/21/2013   PLT 288 10/21/2013    Recent Labs Lab 10/21/13 2201 10/21/13 2205  NA  --  138  K   --  4.8  CL  --  98  CO2  --  28  BUN  --  16  CREATININE  --  1.08  CALCIUM  --  10.0  PROT 8.1  --   BILITOT 0.4  --   ALKPHOS 95  --   ALT 13  --   AST 20  --   GLUCOSE  --  139*    Recent Labs  10/21/13 2205  TROPONINI <0.30   Lab Results  Component Value Date   CHOL 180 04/30/2012   HDL 34.50* 04/30/2012   LDLCALC 110* 04/30/2012   TRIG 177.0* 04/30/2012   No results found for this basename: DDIMER    Radiology/Studies:  Dg Chest Port 1 View  10/21/2013   CLINICAL DATA:  Chest pain, smoker  EXAM: PORTABLE CHEST - 1 VIEW  COMPARISON:  DG CHEST 2 VIEW dated 04/30/2012  FINDINGS: There is no focal parenchymal opacity, pleural effusion, or pneumothorax. The heart and mediastinal contours are unremarkable. There is evidence of prior CABG.  The osseous structures are unremarkable.  IMPRESSION: No active disease.   Electronically Signed   By: Kathreen Devoid   On: 10/21/2013 23:04     EKG:10/21/2013  NSR  Physical Exam Blood pressure 150/58, pulse 76, temperature 98.6 F (37 C), temperature source Oral, resp. rate 18, height 6' (1.829 m), weight 84.55 kg (186 lb 6.4 oz), SpO2 95.00%. General: Well developed, well nourished, in no acute distress. Head: Normocephalic, atraumatic, sclera non-icteric, no xanthomas, nares are without discharge.  Neck: Negative for carotid bruits. JVD not elevated. Lungs: Clear bilaterally to auscultation without wheezes, rales, or rhonchi. Breathing is unlabored. Heart: RRR with S1 S2. No murmurs, rubs, or gallops appreciated. Abdomen: Soft, non-tender, non-distended with normoactive bowel sounds. No hepatomegaly. No rebound/guarding. No obvious abdominal masses. Msk:  Strength and tone appear normal for age. Extremities: No clubbing or cyanosis. No edema.  Distal pedal pulses are 2+ and equal bilaterally. Neuro: Alert and oriented X 3. No focal deficit. No facial asymmetry. Moves all extremities spontaneously. Psych:  Responds to questions  appropriately with a normal affect.    ASSESSMENT AND PLAN:  - Chest pain in this pt with hx of CAD s/p CABG  - smoking  - HTN   -Pt with extensive cardiac history and multiple risk factors including persistent smoking and no recent followup with cardiologist . Would rule out  ACS , monitor on tele . Cont current medications including aspirin , Effient, statin , b-blocker . would consider stress test if CE remain negative . Keep  NPO  - counseled on smoking cessation   Signed, Grafton Folk M.D  10/22/2013, 2:54 AM

## 2013-10-22 NOTE — H&P (View-Only) (Signed)
Subjective: No more CP.  No SOB  Objective: Vital signs in last 24 hours: Temp:  [97.7 F (36.5 C)-98.6 F (37 C)] 98.3 F (36.8 C) (05/05 0546) Pulse Rate:  [76-82] 76 (05/05 0546) Resp:  [11-24] 18 (05/05 0546) BP: (122-209)/(58-113) 155/85 mmHg (05/05 0546) SpO2:  [92 %-98 %] 94 % (05/05 0546) Weight:  [186 lb 6.4 oz (84.55 kg)-200 lb (90.719 kg)] 186 lb 6.4 oz (84.55 kg) (05/05 0145) Last BM Date: 10/21/13  Intake/Output from previous day: 05/04 0701 - 05/05 0700 In: -  Out: 150 [Urine:150] Intake/Output this shift:    Medications Current Facility-Administered Medications  Medication Dose Route Frequency Provider Last Rate Last Dose  . aspirin EC tablet 162 mg  162 mg Oral Daily Mian A Yousuf, MD      . carvedilol (COREG) tablet 6.25 mg  6.25 mg Oral BID WC Mian A Yousuf, MD      . heparin injection 5,000 Units  5,000 Units Subcutaneous 3 times per day Grafton Folk, MD   5,000 Units at 10/22/13 [phone]  . lisinopril (PRINIVIL,ZESTRIL) tablet 20 mg  20 mg Oral Daily Grafton Folk, MD      . nitroGLYCERIN (NITROSTAT) SL tablet 0.4 mg  0.4 mg Sublingual Once Maudry Diego, MD      . nitroGLYCERIN (NITROSTAT) SL tablet 0.4 mg  0.4 mg Sublingual Q5 min PRN Grafton Folk, MD      . Derrill Memo ON 10/23/2013] pneumococcal 23 valent vaccine (PNU-IMMUNE) injection 0.5 mL  0.5 mL Intramuscular Tomorrow-1000 Grafton Folk, MD      . prasugrel (EFFIENT) tablet 10 mg  10 mg Oral Daily Grafton Folk, MD        PE: General appearance: alert, cooperative and no distress Lungs: clear to auscultation bilaterally Heart: regular rate and rhythm, S1, S2 normal, no murmur, click, rub or gallop Extremities: No LEE Pulses: 2+ and symmetric Skin: Warm and dry Neurologic: Grossly normal  Lab Results:   Recent Labs  10/21/13 2205 10/22/13 0622  WBC 12.7* 9.5  HGB 15.3 15.0  HCT 43.5 43.7  PLT 288 265   BMET  Recent Labs  10/21/13 2205 10/22/13 0622  NA 138 138  K 4.8 4.2  CL  98 100  CO2 28 26  GLUCOSE 139* 106*  BUN 16 16  CREATININE 1.08 1.01  CALCIUM 10.0 9.4   PT/INR No results found for this basename: LABPROT, INR,  in the last 72 hours Cholesterol  Recent Labs  10/22/13 0622  CHOL 192   Lipid Panel     Component Value Date/Time   CHOL 192 10/22/2013 0622   TRIG 208* 10/22/2013 0622   HDL 36* 10/22/2013 0622   CHOLHDL 5.3 10/22/2013 0622   VLDL 42* 10/22/2013 0622   LDLCALC 114* 10/22/2013 0622    Cardiac Panel (last 3 results)  Recent Labs  10/21/13 2205 10/22/13 0622  TROPONINI <0.30 4.40*      Assessment/Plan  63 with hx of CAD s/p CABG and last PCI in 2013, HTN , HLD , AAA repair, smoker here with chest pain  Pt states that he was riding his tractor when he started having his typical chest pain that is described as left sided pressure , non radiating with no other ass symptoms. Pt took his SL NTG that did not completely alleviate his pain which is why he decided to come into the ER. Pt denies any Pt denies any SOB , orthopnea, PND ,  LE edema , Syncope ,claudcation , focal weakness, or bleeding diathesis .  Pt continues to smoke half pack per day . Has not seen his cardiologist since 04/2012.  Does report compliance with his medications   Principal Problem:   NSTEMI (non-ST elevated myocardial infarction) Troponin up to 4.40.  IV heparin added.  He is now on the cath schedule for today.  Active Problems:   Unstable angina  Pain resolved    Hyperlipidemia Atherosclerotic lipid panel. Uncontrolled.  Not on a statin due to muscle aches.  We discussed dietary changes but this will need to be reinforced.  Will add fenofibrate for now.     CAD (coronary artery disease)   s/p CABG and last PCI in 2013    Hypertension  Elevated.  On coreg 6.25 and lisinopril 20.  Increasing coreg to 12.5 BID.      Tobacco abuse  Discussed tobacco cessation.    LOS: 1 day    Tarri Fuller PA-C 10/22/2013 8:22 AM  Attending Note:   The patient was  seen and examined.  Agree with assessment and plan as noted above.  Changes made to the above note as needed.  He continues to smoke.    We had some discussion about this .    At his last cath, he was done from the left wrist.  He has a hx of AAA repair and there was some difficulty in getting up from the groin.   Comfortable at present   Ramond Dial., MD, [facility] 10/22/2013, 10:08 AM

## 2013-10-22 NOTE — Research (Signed)
LATITUDE Research Study Informed Consent   Subject Name: Clifford Jones  Subject met inclusion and exclusion criteria.  The informed consent form, study requirements and expectations were reviewed with the subject and questions and concerns were addressed prior to the signing of the consent form.  The subject verbalized understanding of the trail requirements.  The subject agreed to participate in the LATITUDE trial and signed the informed consent at 0925 on 10/22/2013.  The informed consent was obtained prior to performance of any protocol-specific procedures for the subject.  A copy of the signed informed consent was given to the subject and a copy was placed in the subject's medical record.  Lamar Laundry Yousef Huge 10/22/2013, 1:26 PM

## 2013-10-22 NOTE — Progress Notes (Signed)
ANTICOAGULATION CONSULT NOTE - Initial Consult  Pharmacy Consult for Heparin  Indication: chest pain/ACS  No Known Allergies  Patient Measurements: Height: 6' (182.9 cm) Weight: 186 lb 6.4 oz (84.55 kg) IBW/kg (Calculated) : 77.6 Heparin Dosing Weight: n/a  Vital Signs: Temp: 98.3 F (36.8 C) (05/05 0546) Temp src: Oral (05/05 0546) BP: 155/85 mmHg (05/05 0546) Pulse Rate: 76 (05/05 0546)  Labs:  Recent Labs  10/21/13 2205 10/22/13 0622  HGB 15.3 15.0  HCT 43.5 43.7  PLT 288 265  CREATININE 1.08 1.01  TROPONINI <0.30 4.40*    Estimated Creatinine Clearance: 84.3 ml/min (by C-G formula based on Cr of 1.01).   Medical History: Past Medical History  Diagnosis Date  . Hypertension   . Hyperlipidemia   . Coronary artery disease     a. 1999 Inf MI;  b. 2003 x 6;  c. 2008 NSTEMI, occluded SVG to marginal and diagonal system.2 DES placed to marginal system;  c. NSTEMI - LHC 03/05/12:  L-LAD ok with Clifford% after the insertion leading in dLAD, SVG-Dx new 100%, oS-OM 90%, multiple stents w/ 90% ISR, distal small w/ 80/Clifford%, R-dRCA no significant disease, EF 50%. PCI 03/07/12 Xience Xpedition DES to oS-OM and POBA to mid lesion.    Marland Kitchen GERD (gastroesophageal reflux disease)   . S/P AAA repair   . Tobacco abuse   . MI (myocardial infarction)     Medications:  Prescriptions prior to admission  Medication Sig Dispense Refill  . aspirin EC 81 MG tablet Take 162 mg by mouth daily.      . carvedilol (COREG) 6.25 MG tablet Take 1 tablet (6.25 mg total) by mouth 2 (two) times daily with a meal.  60 tablet  11  . lisinopril (PRINIVIL,ZESTRIL) 20 MG tablet Take 20 mg by mouth daily.      . nitroGLYCERIN (NITROSTAT) 0.4 MG SL tablet Place 0.4 mg under the tongue every 5 (five) minutes as needed for chest pain.      . prasugrel (EFFIENT) 10 MG TABS tablet Take 1 tablet (10 mg total) by mouth daily.  30 tablet  3    Assessment: Clifford Jones presents to the ED with chest pain and left sided  pressure that did not resolve with SL NTG. Troponin was elevated this AM at 4.4. Pharmacy consulted to start heparin. Hgb and Plt wnl. He is currently on subcutaneous heparin and he received his last heparin shot at ~ 0613 this AM.   Goal of Therapy:  Heparin level 0.3-0.7 units/ml Monitor platelets by anticoagulation protocol: Yes   Plan:  -Start heparin infusion at 1050 units/hr. No bolus since he received 5000 units subcutaneously this AM  -Check anti-Xa level in 6 hours and daily while on heparin -Continue to monitor H&H and platelets  Albertina Parr, PharmD.  Clinical Pharmacist Pager [phone]

## 2013-10-23 ENCOUNTER — Encounter (HOSPITAL_COMMUNITY)
Admission: EM | Disposition: A | Discharge status: Home / Self Care | Payer: Self-pay | Source: Home / Self Care | Attending: Cardiovascular Disease

## 2013-10-23 DIAGNOSIS — E785 Hyperlipidemia, unspecified: Secondary | ICD-10-CM

## 2013-10-23 DIAGNOSIS — I25810 Atherosclerosis of coronary artery bypass graft(s) without angina pectoris: Secondary | ICD-10-CM

## 2013-10-23 HISTORY — PX: PERCUTANEOUS CORONARY STENT INTERVENTION (PCI-S): SHX5485

## 2013-10-23 HISTORY — PX: LEFT HEART CATHETERIZATION WITH CORONARY ANGIOGRAM: SHX5451

## 2013-10-23 LAB — BASIC METABOLIC PANEL
BUN: 17 mg/dL (ref 6–23)
CHLORIDE: 102 meq/L (ref 96–112)
CO2: 26 meq/L (ref 19–32)
CREATININE: 0.93 mg/dL (ref 0.50–1.35)
Calcium: 8.8 mg/dL (ref 8.4–10.5)
GFR calc non Af Amer: 89 mL/min — ABNORMAL LOW (ref >90)
Glucose, Bld: 129 mg/dL — ABNORMAL HIGH (ref 70–99)
Potassium: 4.2 mEq/L (ref 3.7–5.3)
Sodium: 139 mEq/L (ref 137–147)

## 2013-10-23 LAB — GLUCOSE, CAPILLARY: Glucose-Capillary: 104 mg/dL — ABNORMAL HIGH (ref 70–99)

## 2013-10-23 LAB — CBC
HEMATOCRIT: 42 % (ref 39.0–52.0)
Hemoglobin: 14.2 g/dL (ref 13.0–17.0)
MCH: 30.3 pg (ref 26.0–34.0)
MCHC: 33.8 g/dL (ref 30.0–36.0)
MCV: 89.7 fL (ref 78.0–100.0)
Platelets: 262 10*3/uL (ref 150–400)
RBC: 4.68 MIL/uL (ref 4.22–5.81)
RDW: 13.1 % (ref 11.5–15.5)
WBC: 11.4 10*3/uL — AB (ref 4.0–10.5)

## 2013-10-23 LAB — POCT ACTIVATED CLOTTING TIME: Activated Clotting Time: 260 seconds

## 2013-10-23 LAB — HEPARIN LEVEL (UNFRACTIONATED)
Heparin Unfractionated: 0.11 IU/mL — ABNORMAL LOW (ref 0.30–0.70)
Heparin Unfractionated: 0.11 IU/mL — ABNORMAL LOW (ref 0.30–0.70)

## 2013-10-23 SURGERY — LEFT HEART CATHETERIZATION WITH CORONARY ANGIOGRAM
Anesthesia: LOCAL | Site: Hand | Laterality: Right

## 2013-10-23 MED ORDER — OXYCODONE-ACETAMINOPHEN 5-325 MG PO TABS
1.0000 | ORAL_TABLET | ORAL | Status: DC | PRN
  Administered 2013-10-23: 2 via ORAL
  Filled 2013-10-23: qty 2

## 2013-10-23 MED ORDER — HEPARIN SODIUM (PORCINE) 1000 UNIT/ML IJ SOLN
INTRAMUSCULAR | Status: AC
  Filled 2013-10-23: qty 1

## 2013-10-23 MED ORDER — SODIUM CHLORIDE 0.9 % IV SOLN
1.0000 mL/kg/h | INTRAVENOUS | Status: DC
  Administered 2013-10-23: 1 mL/kg/h via INTRAVENOUS

## 2013-10-23 MED ORDER — MIDAZOLAM HCL 2 MG/2ML IJ SOLN
INTRAMUSCULAR | Status: AC
  Filled 2013-10-23: qty 2

## 2013-10-23 MED ORDER — VERAPAMIL HCL 2.5 MG/ML IV SOLN
INTRAVENOUS | Status: AC
  Filled 2013-10-23: qty 2

## 2013-10-23 MED ORDER — LIDOCAINE HCL (PF) 1 % IJ SOLN
INTRAMUSCULAR | Status: AC
  Filled 2013-10-23: qty 30

## 2013-10-23 MED ORDER — ONDANSETRON HCL 4 MG/2ML IJ SOLN: 4.0000 mg | Freq: Four times a day (QID) | INTRAMUSCULAR | Status: DC | PRN

## 2013-10-23 MED ORDER — SODIUM CHLORIDE 0.9 % IJ SOLN: 3.0000 mL | Freq: Two times a day (BID) | INTRAMUSCULAR | Status: DC

## 2013-10-23 MED ORDER — ACETAMINOPHEN 325 MG PO TABS: 650.0000 mg | ORAL_TABLET | ORAL | Status: DC | PRN

## 2013-10-23 MED ORDER — HEPARIN (PORCINE) IN NACL 2-0.9 UNIT/ML-% IJ SOLN
INTRAMUSCULAR | Status: AC
  Filled 2013-10-23: qty 1000

## 2013-10-23 MED ORDER — NITROGLYCERIN 0.2 MG/ML ON CALL CATH LAB
INTRAVENOUS | Status: AC
  Filled 2013-10-23: qty 1

## 2013-10-23 MED ORDER — SODIUM CHLORIDE 0.9 % IV SOLN: 1.0000 mL/kg/h | INTRAVENOUS | Status: AC

## 2013-10-23 MED ORDER — SODIUM CHLORIDE 0.9 % IJ SOLN: 3.0000 mL | INTRAMUSCULAR | Status: DC | PRN

## 2013-10-23 MED ORDER — SODIUM CHLORIDE 0.9 % IJ SOLN
3.0000 mL | Freq: Two times a day (BID) | INTRAMUSCULAR | Status: DC
  Administered 2013-10-23: 3 mL via INTRAVENOUS

## 2013-10-23 MED ORDER — HEPARIN (PORCINE) IN NACL 2-0.9 UNIT/ML-% IJ SOLN
INTRAMUSCULAR | Status: AC
  Filled 2013-10-23: qty 500

## 2013-10-23 MED ORDER — SODIUM CHLORIDE 0.9 % IV SOLN: 250.0000 mL | INTRAVENOUS | Status: DC | PRN

## 2013-10-23 MED ORDER — FENTANYL CITRATE 0.05 MG/ML IJ SOLN
INTRAMUSCULAR | Status: AC
  Filled 2013-10-23: qty 2

## 2013-10-23 NOTE — H&P (View-Only) (Signed)
Patient Name: Clifford Jones Date of Encounter: 10/23/2013     Principal Problem:   NSTEMI (non-ST elevated myocardial infarction) Active Problems:   CAD (coronary artery disease)   Hypertension   Tobacco abuse   Hyperlipidemia   S/P AAA repair   Peripheral vascular disease    SUBJECTIVE   No chest pain or sob overnight.    CURRENT MEDS . aspirin EC  162 mg Oral Daily  . carvedilol  12.5 mg Oral BID WC  . fenofibrate  160 mg Oral Daily  . research study medication  7.5 mg Oral BID  . lisinopril  20 mg Oral Daily  . nitroGLYCERIN  0.4 mg Sublingual Once  . pneumococcal 23 valent vaccine  0.5 mL Intramuscular Tomorrow-1000  . prasugrel  10 mg Oral Daily  . ranolazine  500 mg Oral BID    OBJECTIVE  Filed Vitals:   10/23/13 0300 10/23/13 0400 10/23/13 0500 10/23/13 0600  BP: 129/57 114/66 114/74 119/66  Pulse:    79  Temp:    98.7 F (37.1 C)  TempSrc:    Oral  Resp:    16  Height:      Weight:    187 lb 9.8 oz (85.1 kg)  SpO2:    91%    Intake/Output Summary (Last 24 hours) at 10/23/13 0643 Last data filed at 10/23/13 0600  Gross per 24 hour  Intake      0 ml  Output    800 ml  Net   -800 ml   Filed Weights   10/21/13 2149 10/22/13 0145 10/23/13 0600  Weight: 200 lb (90.719 kg) 186 lb 6.4 oz (84.55 kg) 187 lb 9.8 oz (85.1 kg)    PHYSICAL EXAM  General: Pleasant, NAD. Neuro: Alert and oriented X 3. Moves all extremities spontaneously. Psych: Normal affect. HEENT:  Normal  Neck: Supple without bruits or JVD. Lungs:  Resp regular and unlabored, CTA. Heart: RRR no s3, s4, or murmurs. Abdomen: Soft, non-tender, non-distended, BS + x 4.  Extremities: No clubbing, cyanosis or edema. DP/PT/Radials 2+ and equal bilaterally.  L wrist and L groin w/o bleeding/bruit/hematoma.  Accessory Clinical Findings  CBC  Recent Labs  10/22/13 0622 10/23/13 0411  WBC 9.5 11.4*  HGB 15.0 14.2  HCT 43.7 42.0  MCV 89.4 89.7  PLT 265 347   Basic Metabolic  Panel  Recent Labs  10/21/13 2205 10/22/13 0622  NA 138 138  K 4.8 4.2  CL 98 100  CO2 28 26  GLUCOSE 139* 106*  BUN 16 16  CREATININE 1.08 1.01  CALCIUM 10.0 9.4   Liver Function Tests  Recent Labs  10/21/13 2201  AST 20  ALT 13  ALKPHOS 95  BILITOT 0.4  PROT 8.1  ALBUMIN 4.1   Cardiac Enzymes  Recent Labs  10/22/13 0622 10/22/13 0910 10/22/13 2045  TROPONINI 4.40* 3.78* 1.39*   Fasting Lipid Panel  Recent Labs  10/22/13 0622  CHOL 192  HDL 36*  LDLCALC 114*  TRIG 208*  CHOLHDL 5.3   TELE  rsr  Radiology/Studies  Dg Chest Port 1 View  10/21/2013   CLINICAL DATA:  Chest pain, smoker  EXAM: PORTABLE CHEST - 1 VIEW  COMPARISON:  DG CHEST 2 VIEW dated 04/30/2012  FINDINGS: There is no focal parenchymal opacity, pleural effusion, or pneumothorax. The heart and mediastinal contours are unremarkable. There is evidence of prior CABG.  The osseous structures are unremarkable.  IMPRESSION: No active disease.   Electronically Signed  By: Kathreen Devoid   On: 10/21/2013 23:04    ASSESSMENT AND PLAN  1.  NSTEMI/CAD s/p CABG:  S/p cath yesterday revealing ISR within the VG->OM, severe distal LAD dzs, and native Diag dzs.  Interventional team was unable to visualize RIMA graft.  He has been on heparin and ntg overnight w/o recurrent chest pain.  Dr. Burt Knack to review films this morning and plan repeat diagnostic cath to visualize RIMA and then decide on percutaneous options vs possible redo CABG. Cont asa, heparin, ntg, bb, effient, fibrate, and ranexa.  He has been enrolled in LATITUDE trial.    2.  HTN:  Stable.  Cont BB.  3.  HL:  LDL 114.  He says that he is on simvastatin @ home but notes imply that this was d/c'd secondary to muscle aches.  Tricor has been started here.  Follow.  LFT's wnl.  4.  Tob Abuse:  Cessation advised.  Signed, Rogelia Mire NP  Patient seen, examined. Available data reviewed. Agree with findings, assessment, and plan as  outlined by Ignacia Bayley, NP. Pt alert and oriented, NAD. Lungs CTA, heart RRR without murmur, extremities without edema. Pt has been CP-free overnight. Films reviewed. Plan RIMA angiography today then will decide on whether to continue with medic al therapy or consider revascularization. Reviewed risks, indications, and alternatives of cath and PCI with the patient who understands and agrees to proceed.  Sherren Mocha, M.D. 10/23/2013 5:44 PM

## 2013-10-23 NOTE — CV Procedure (Signed)
    Cardiac Catheterization Procedure Note  Name: Clifford Jones MRN: [id number] DOB: 1950/12/06  Procedure: Selective coronary angiography, PTCA and stenting of the right PLA branch through the [place] graft  Indication: Non-ST elevation infarction. Clifford Jones is a 63 year old gentleman with very severe diffuse coronary artery disease and poor distal target vessels who has undergone coronary bypass surgery in the past. He's also undergone PCI with previous stenting of the saphenous vein graft obtuse marginal. He has continued to smoke cigarettes. He presented with a non-ST elevation infarction and underwent cardiac catheterization yesterday. This demonstrated severe stenosis in the saphenous vein graft to OM, but there was very severe native OM disease in the vessel was small and not suitable for PCI. He is also noted to have subtotal stenosis of the native LAD after the LIMA insertion site. The LAD is an extremely small vessel through its mid and distal portions. The RIMA to RCA was not able to be visualized because of severe subclavian tortuosity. The patient returns today for angiography of the RIMA graft and possible PCI.   Procedural Details: The right wrist was prepped, draped, and anesthetized with 1% lidocaine. Using the modified Seldinger technique, a 5/6 French sheath was introduced into the right radial artery. 3 mg of verapamil was administered through the sheath, weight-based unfractionated heparin was administered intravenously. A LIMA catheter was used for selective angiography of the RIMA to RCA. Following diagnostic angiography, I elected to perform PCI of the right PLA branch and the posterior AV segment where there was 95% stenosis present. Additional intravenous unfractionated heparin was administered. A therapeutic ACT was achieved (greater than 250). A LIMA guide catheter was utilized and a pro-water wire was advanced across the lesion. The wire was occlusive and there was no flow  beyond the lesion after the wire crossed. The lesion was dilated with a 2.5 mm lumen to 6 atmospheres. The lesion was then stented with a 2.5 x 14 mm resolute drug-eluting stent which was deployed at 12 atmospheres. The stented segment was postdilated with a 2.75 mm noncompliant balloon. There was an excellent angiographic result with TIMI 3 flow and 0% residual stenosis at the lesion site. A TR band was used for radial hemostasis at the completion of the procedure.  The patient was transferred to the post catheterization recovery area for further monitoring.  Procedural Findings: Hemodynamics: AO 110/60 with a mean of 79  Coronary angiography: RIMA angiography: The RIMA is widely patent throughout its course. The vessel is anastomosed distally just before the bifurcation of the PDA and PLA branches. The PDA is patent with mild irregularity and 30-40% stenosis at its ostium. The PLA branch has severe 95% stenosis in the posterior AV segment. The remainder of the first PL branch has no significant disease, but there is diffuse irregularity.  Final Conclusions:  Continued patency of the RIMA to RCA with severe stenosis of the PLA, treated successfully with PCI as detailed above.  Recommendations: Continue aspirin and effient for at least 12 months.  Sherren Mocha 10/23/2013, 6:38 PM

## 2013-10-23 NOTE — Progress Notes (Signed)
ANTICOAGULATION CONSULT NOTE - Follow Up Consult  Pharmacy Consult for Heparin Indication: chest pain/ACS  No Known Allergies  Patient Measurements: Height: 6' (182.9 cm) Weight: 187 lb 9.8 oz (85.1 kg) IBW/kg (Calculated) : 77.6 Heparin Dosing Weight:   Vital Signs: Temp: 98.8 F (37.1 C) (05/06 0759) Temp src: Oral (05/06 0759) BP: 116/73 mmHg (05/06 0759) Pulse Rate: 82 (05/06 0759)  Labs:  Recent Labs  10/21/13 2205 10/22/13 0622 10/22/13 0910 10/22/13 2045 10/23/13 0411 10/23/13 0635  HGB 15.3 15.0  --   --  14.2  --   HCT 43.5 43.7  --   --  42.0  --   PLT 288 265  --   --  262  --   HEPARINUNFRC  --   --   --   --  <0.10* 0.11*  CREATININE 1.08 1.01  --   --   --  0.93  TROPONINI <0.30 4.40* 3.78* 1.39*  --   --     Estimated Creatinine Clearance: 91.6 ml/min (by C-G formula based on Cr of 0.93).   Medications:  Scheduled:  . aspirin EC  162 mg Oral Daily  . carvedilol  12.5 mg Oral BID WC  . fenofibrate  160 mg Oral Daily  . research study medication  7.5 mg Oral BID  . lisinopril  20 mg Oral Daily  . nitroGLYCERIN  0.4 mg Sublingual Once  . pneumococcal 23 valent vaccine  0.5 mL Intramuscular Tomorrow-1000  . prasugrel  10 mg Oral Daily  . ranolazine  500 mg Oral BID  . sodium chloride  3 mL Intravenous Q12H    Assessment: 63yo male with NSTEMI, heparin resumed ~2AM today s/p cath on 5/5.  Pt has had no further chest pain; PCI vs re-do CABG being considered.  Heparin level low this AM at 0.11, having been drawn 2hr early though do not expect this to have achieved goal in the next 2hr.  Therefore will adjust.  Goal of Therapy:  Heparin level 0.3-0.7 units/ml Monitor platelets by anticoagulation protocol: Yes   Plan:  1-  Increase heparin to 1250 units/hr 2-  Check HL 6hr  Gracy Bruins, PharmD Clinical Pharmacist [facility]

## 2013-10-23 NOTE — Progress Notes (Signed)
ANTICOAGULATION CONSULT NOTE - Follow Up Consult  Pharmacy Consult for heparin Indication: chest pain/ACS  No Known Allergies  Patient Measurements: Height: 6' (182.9 cm) Weight: 187 lb 9.8 oz (85.1 kg) IBW/kg (Calculated) : 77.6 Heparin Dosing Weight: 85.1 kg  Vital Signs: Temp: 98.6 F (37 C) (05/06 1202) Temp src: Oral (05/06 1202) BP: 134/56 mmHg (05/06 1202) Pulse Rate: 59 (05/06 1202)  Labs:  Recent Labs  10/21/13 2205 10/22/13 0622 10/22/13 0910 10/22/13 2045 10/23/13 0411 10/23/13 0635  HGB 15.3 15.0  --   --  14.2  --   HCT 43.5 43.7  --   --  42.0  --   PLT 288 265  --   --  262  --   HEPARINUNFRC  --   --   --   --  <0.10* 0.11*  CREATININE 1.08 1.01  --   --   --  0.93  TROPONINI <0.30 4.40* 3.78* 1.39*  --   --     Estimated Creatinine Clearance: 91.6 ml/min (by C-G formula based on Cr of 0.93).   Medications:  Scheduled:  . aspirin EC  162 mg Oral Daily  . carvedilol  12.5 mg Oral BID WC  . fenofibrate  160 mg Oral Daily  . research study medication  7.5 mg Oral BID  . lisinopril  20 mg Oral Daily  . nitroGLYCERIN  0.4 mg Sublingual Once  . prasugrel  10 mg Oral Daily  . ranolazine  500 mg Oral BID  . sodium chloride  3 mL Intravenous Q12H   Infusions:  . sodium chloride 1 mL/kg/hr (10/23/13 0800)  . heparin 1,250 Units/hr (10/23/13 0948)  . nitroGLYCERIN 5 mcg/min (10/22/13 2138)    Assessment: 63 yo male with chest pain is currently on subthearpeutic heparin.  Heparin level is 0.11.  Verified with RN that rate was actually changed to 1250 units/hr and no issue with infusion.  Patient will go to cath later.  Goal of Therapy:  Heparin level 0.3-0.7 units/ml Monitor platelets by anticoagulation protocol: Yes   Plan:  1) Increase heparin to 1400 units/hr 2) f/u after cath or heparin level at 2330  Clifford Jones 10/23/2013,4:07 PM

## 2013-10-23 NOTE — Progress Notes (Signed)
Patient Name: Clifford Jones Date of Encounter: 10/23/2013     Principal Problem:   NSTEMI (non-ST elevated myocardial infarction) Active Problems:   CAD (coronary artery disease)   Hypertension   Tobacco abuse   Hyperlipidemia   S/P AAA repair   Peripheral vascular disease    SUBJECTIVE   No chest pain or sob overnight.    CURRENT MEDS . aspirin EC  162 mg Oral Daily  . carvedilol  12.5 mg Oral BID WC  . fenofibrate  160 mg Oral Daily  . research study medication  7.5 mg Oral BID  . lisinopril  20 mg Oral Daily  . nitroGLYCERIN  0.4 mg Sublingual Once  . pneumococcal 23 valent vaccine  0.5 mL Intramuscular Tomorrow-1000  . prasugrel  10 mg Oral Daily  . ranolazine  500 mg Oral BID    OBJECTIVE  Filed Vitals:   10/23/13 0300 10/23/13 0400 10/23/13 0500 10/23/13 0600  BP: 129/57 114/66 114/74 119/66  Pulse:    79  Temp:    98.7 F (37.1 C)  TempSrc:    Oral  Resp:    16  Height:      Weight:    187 lb 9.8 oz (85.1 kg)  SpO2:    91%    Intake/Output Summary (Last 24 hours) at 10/23/13 0643 Last data filed at 10/23/13 0600  Gross per 24 hour  Intake      0 ml  Output    800 ml  Net   -800 ml   Filed Weights   10/21/13 2149 10/22/13 0145 10/23/13 0600  Weight: 200 lb (90.719 kg) 186 lb 6.4 oz (84.55 kg) 187 lb 9.8 oz (85.1 kg)    PHYSICAL EXAM  General: Pleasant, NAD. Neuro: Alert and oriented X 3. Moves all extremities spontaneously. Psych: Normal affect. HEENT:  Normal  Neck: Supple without bruits or JVD. Lungs:  Resp regular and unlabored, CTA. Heart: RRR no s3, s4, or murmurs. Abdomen: Soft, non-tender, non-distended, BS + x 4.  Extremities: No clubbing, cyanosis or edema. DP/PT/Radials 2+ and equal bilaterally.  L wrist and L groin w/o bleeding/bruit/hematoma.  Accessory Clinical Findings  CBC  Recent Labs  10/22/13 0622 10/23/13 0411  WBC 9.5 11.4*  HGB 15.0 14.2  HCT 43.7 42.0  MCV 89.4 89.7  PLT 265 102   Basic Metabolic  Panel  Recent Labs  10/21/13 2205 10/22/13 0622  NA 138 138  K 4.8 4.2  CL 98 100  CO2 28 26  GLUCOSE 139* 106*  BUN 16 16  CREATININE 1.08 1.01  CALCIUM 10.0 9.4   Liver Function Tests  Recent Labs  10/21/13 2201  AST 20  ALT 13  ALKPHOS 95  BILITOT 0.4  PROT 8.1  ALBUMIN 4.1   Cardiac Enzymes  Recent Labs  10/22/13 0622 10/22/13 0910 10/22/13 2045  TROPONINI 4.40* 3.78* 1.39*   Fasting Lipid Panel  Recent Labs  10/22/13 0622  CHOL 192  HDL 36*  LDLCALC 114*  TRIG 208*  CHOLHDL 5.3   TELE  rsr  Radiology/Studies  Dg Chest Port 1 View  10/21/2013   CLINICAL DATA:  Chest pain, smoker  EXAM: PORTABLE CHEST - 1 VIEW  COMPARISON:  DG CHEST 2 VIEW dated 04/30/2012  FINDINGS: There is no focal parenchymal opacity, pleural effusion, or pneumothorax. The heart and mediastinal contours are unremarkable. There is evidence of prior CABG.  The osseous structures are unremarkable.  IMPRESSION: No active disease.   Electronically Signed  By: Kathreen Devoid   On: 10/21/2013 23:04    ASSESSMENT AND PLAN  1.  NSTEMI/CAD s/p CABG:  S/p cath yesterday revealing ISR within the VG->OM, severe distal LAD dzs, and native Diag dzs.  Interventional team was unable to visualize RIMA graft.  He has been on heparin and ntg overnight w/o recurrent chest pain.  Dr. Burt Knack to review films this morning and plan repeat diagnostic cath to visualize RIMA and then decide on percutaneous options vs possible redo CABG. Cont asa, heparin, ntg, bb, effient, fibrate, and ranexa.  He has been enrolled in LATITUDE trial.    2.  HTN:  Stable.  Cont BB.  3.  HL:  LDL 114.  He says that he is on simvastatin @ home but notes imply that this was d/c'd secondary to muscle aches.  Tricor has been started here.  Follow.  LFT's wnl.  4.  Tob Abuse:  Cessation advised.  Signed, Rogelia Mire NP  Patient seen, examined. Available data reviewed. Agree with findings, assessment, and plan as  outlined by Ignacia Bayley, NP. Pt alert and oriented, NAD. Lungs CTA, heart RRR without murmur, extremities without edema. Pt has been CP-free overnight. Films reviewed. Plan RIMA angiography today then will decide on whether to continue with medic al therapy or consider revascularization. Reviewed risks, indications, and alternatives of cath and PCI with the patient who understands and agrees to proceed.  Sherren Mocha, M.D. 10/23/2013 5:44 PM

## 2013-10-23 NOTE — Interval H&P Note (Signed)
History and Physical Interval Note:  10/23/2013 5:49 PM  Clifford Jones  has presented today for surgery, with the diagnosis of cp  The various methods of treatment have been discussed with the patient and family. After consideration of risks, benefits and other options for treatment, the patient has consented to  Procedure(s): LEFT HEART CATHETERIZATION WITH CORONARY ANGIOGRAM (N/A) as a surgical intervention .  The patient's history has been reviewed, patient examined, no change in status, stable for surgery.  I have reviewed the patient's chart and labs.  Questions were answered to the patient's satisfaction.    Cath Lab Visit (complete for each Cath Lab visit)  Clinical Evaluation Leading to the Procedure:   ACS: yes  Non-ACS:    Anginal Classification: CCS IV  Anti-ischemic medical therapy: Maximal Therapy (2 or more classes of medications)  Non-Invasive Test Results: No non-invasive testing performed  Prior CABG: Previous CABG       Sherren Mocha

## 2013-10-24 ENCOUNTER — Encounter (HOSPITAL_COMMUNITY): Payer: Self-pay | Admitting: Nurse Practitioner

## 2013-10-24 ENCOUNTER — Telehealth: Payer: Self-pay | Admitting: Nurse Practitioner

## 2013-10-24 DIAGNOSIS — I214 Non-ST elevation (NSTEMI) myocardial infarction: Secondary | ICD-10-CM

## 2013-10-24 DIAGNOSIS — F17200 Nicotine dependence, unspecified, uncomplicated: Secondary | ICD-10-CM

## 2013-10-24 LAB — BASIC METABOLIC PANEL
BUN: 21 mg/dL (ref 6–23)
CHLORIDE: 103 meq/L (ref 96–112)
CO2: 27 mEq/L (ref 19–32)
Calcium: 8.8 mg/dL (ref 8.4–10.5)
Creatinine, Ser: 1.16 mg/dL (ref 0.50–1.35)
GFR calc non Af Amer: 66 mL/min — ABNORMAL LOW (ref >90)
GFR, EST AFRICAN AMERICAN: 77 mL/min — AB (ref >90)
Glucose, Bld: 100 mg/dL — ABNORMAL HIGH (ref 70–99)
Potassium: 4.9 mEq/L (ref 3.7–5.3)
Sodium: 140 mEq/L (ref 137–147)

## 2013-10-24 LAB — CBC
HCT: 42.1 % (ref 39.0–52.0)
Hemoglobin: 13.8 g/dL (ref 13.0–17.0)
MCH: 30.1 pg (ref 26.0–34.0)
MCHC: 32.8 g/dL (ref 30.0–36.0)
MCV: 91.9 fL (ref 78.0–100.0)
Platelets: 217 10*3/uL (ref 150–400)
RBC: 4.58 MIL/uL (ref 4.22–5.81)
RDW: 13.1 % (ref 11.5–15.5)
WBC: 11.9 10*3/uL — AB (ref 4.0–10.5)

## 2013-10-24 LAB — TROPONIN I: Troponin I: 4.73 ng/mL (ref <0.30)

## 2013-10-24 MED ORDER — CARVEDILOL 12.5 MG PO TABS: 12.5000 mg | ORAL_TABLET | Freq: Two times a day (BID) | ORAL | Status: DC

## 2013-10-24 MED ORDER — NITROGLYCERIN 0.4 MG SL SUBL: 0.4000 mg | SUBLINGUAL_TABLET | Freq: Once | SUBLINGUAL | Status: DC

## 2013-10-24 MED ORDER — RANOLAZINE ER 500 MG PO TB12: 500.0000 mg | ORAL_TABLET | Freq: Two times a day (BID) | ORAL | Status: DC

## 2013-10-24 MED ORDER — ASPIRIN EC 81 MG PO TBEC: 81.0000 mg | DELAYED_RELEASE_TABLET | Freq: Every day | ORAL | Status: DC

## 2013-10-24 MED ORDER — STUDY - INVESTIGATIONAL DRUG SIMPLE RECORD: 7.5000 mg | Freq: Two times a day (BID) | Status: DC

## 2013-10-24 MED ORDER — PRASUGREL HCL 10 MG PO TABS: 10.0000 mg | ORAL_TABLET | Freq: Every day | ORAL | Status: DC

## 2013-10-24 MED ORDER — FENOFIBRATE 160 MG PO TABS: 160.0000 mg | ORAL_TABLET | Freq: Every day | ORAL | Status: DC

## 2013-10-24 NOTE — Progress Notes (Addendum)
CRITICAL VALUE ALERT  Critical value received: Troponin 4.73  Date of notification: 10/24/13   Time of notification:  1043  Critical value read back:yes  Nurse who received alert:  Dina Rich RN  Notified patients nurse Trey Paula RN at [phone] of result. Troponin drawn for research only.

## 2013-10-24 NOTE — Progress Notes (Signed)
Patient Name: Clifford Jones Date of Encounter: 10/24/2013     Principal Problem:   NSTEMI (non-ST elevated myocardial infarction) Active Problems:   CAD (coronary artery disease)   Hypertension   Tobacco abuse   Hyperlipidemia   S/P AAA repair   Peripheral vascular disease    SUBJECTIVE  S/p PCI/DES to the native RPLA yesterday.  No chest pain overnight.  CURRENT MEDS . aspirin EC  162 mg Oral Daily  . carvedilol  12.5 mg Oral BID WC  . fenofibrate  160 mg Oral Daily  . research study medication  7.5 mg Oral BID  . lisinopril  20 mg Oral Daily  . nitroGLYCERIN  0.4 mg Sublingual Once  . prasugrel  10 mg Oral Daily  . ranolazine  500 mg Oral BID  . sodium chloride  3 mL Intravenous Q12H  . sodium chloride  3 mL Intravenous Q12H    OBJECTIVE  Filed Vitals:   10/23/13 2100 10/23/13 2358 10/24/13 0100 10/24/13 0640  BP: 97/47 102/53  167/80  Pulse: 66 61  62  Temp: 98.7 F (37.1 C) 98.4 F (36.9 C)  98.3 F (36.8 C)  TempSrc: Oral Oral  Oral  Resp: 16 16  20   Height:      Weight:   189 lb 2.5 oz (85.8 kg)   SpO2: 91% 94%  95%    Intake/Output Summary (Last 24 hours) at 10/24/13 0829 Last data filed at 10/24/13 0640  Gross per 24 hour  Intake 1499.53 ml  Output   1900 ml  Net -400.47 ml   Filed Weights   10/22/13 0145 10/23/13 0600 10/24/13 0100  Weight: 186 lb 6.4 oz (84.55 kg) 187 lb 9.8 oz (85.1 kg) 189 lb 2.5 oz (85.8 kg)    PHYSICAL EXAM  General: Pleasant, NAD. Neuro: Alert and oriented X 3. Moves all extremities spontaneously. Psych: Normal affect. HEENT:  Normal  Neck: Supple without bruits or JVD. Lungs:  Resp regular and unlabored, diminished breath sounds bilat. Heart: RRR no s3, s4, or murmurs. Abdomen: Soft, non-tender, non-distended, BS + x 4.  Extremities: No clubbing, cyanosis or edema. DP/PT/Radials 1+ and equal bilaterally.  L groin, L radial, and R radial cath sites w/o bleeding/bruit/hematoma.  Accessory Clinical  Findings  CBC  Recent Labs  10/23/13 0411 10/24/13 0405  WBC 11.4* 11.9*  HGB 14.2 13.8  HCT 42.0 42.1  MCV 89.7 91.9  PLT 262 683   Basic Metabolic Panel  Recent Labs  10/23/13 0635 10/24/13 0405  NA 139 140  K 4.2 4.9  CL 102 103  CO2 26 27  GLUCOSE 129* 100*  BUN 17 21  CREATININE 0.93 1.16  CALCIUM 8.8 8.8   Liver Function Tests  Recent Labs  10/21/13 2201  AST 20  ALT 13  ALKPHOS 95  BILITOT 0.4  PROT 8.1  ALBUMIN 4.1   Cardiac Enzymes  Recent Labs  10/22/13 0622 10/22/13 0910 10/22/13 2045  TROPONINI 4.40* 3.78* 1.39*   Fasting Lipid Panel  Recent Labs  10/22/13 0622  CHOL 192  HDL 36*  LDLCALC 114*  TRIG 208*  CHOLHDL 5.3   TELE  sb/rsr  ECG  SB, 58, no acute st/t changes.  Radiology/Studies  Dg Chest Port 1 View  10/21/2013   CLINICAL DATA:  Chest pain, smoker  EXAM: PORTABLE CHEST - 1 VIEW  COMPARISON:  DG CHEST 2 VIEW dated 04/30/2012  FINDINGS: There is no focal parenchymal opacity, pleural effusion, or pneumothorax. The heart  and mediastinal contours are unremarkable. There is evidence of prior CABG.  The osseous structures are unremarkable.  IMPRESSION: No active disease.   Electronically Signed   By: Kathreen Devoid   On: 10/21/2013 23:04   ASSESSMENT AND PLAN  1.  NSTEMI/CAD:  S/p cath yesterday to visualize the RIMA, which showed patency of the graft but new 95% stenosis w/in the RPL.  This was successfully treated with a 2.5x14 resolute DES.  Pt tolerated procedure well.  No chest pain overnight.  Ambulating this AM w/o difficulty.  Cont asa, effient, bb, fibrate.  Intolerant to statins.  He does have residual ISR w/in VG->OM with native OM dzs, along with severe distal LAD (beyond lima insertion) and diag dzs.  Cont ranexa.  2.  HTN:  Stable on BB.  3.  HL:  LDL 114.  Simvastatin apparently causes muscle aches.  Cont tricor.  LFT's wnl.  4.  Tob Abuse:  Cessation advised.  He said he'd cut back but was not interested in  discussing quitting.  5.  Dispo:  Cardiac rehab to ambulate this AM.  D/c after.  Signed, Rogelia Mire NP   Patient seen and examined. Agree with assessment and plan. No chest pain. S/p NSTEMI and PCI to PLA.  R radial site stable. Discussed smoking cessation. DC today.   Troy Sine, MD, [facility] 10/24/2013 8:49 AM

## 2013-10-24 NOTE — Care Management Note (Signed)
    Page 1 of 1   10/24/2013     10:18:20 AM CARE MANAGEMENT NOTE 10/24/2013  Patient:  Clifford Jones, Clifford Jones   Account Number:  [id number]  Date Initiated:  10/24/2013  Documentation initiated by:  [facility]  Subjective/Objective Assessment:   63 with hx of CAD s/p CABG and last PCI in 2013, HTN , HLD ,  AAA repair, smoker here with chest pain. //Home with spouse.     Action/Plan:   Procedure: Selective coronary angiography, PTCA and stenting of the right PLA branch through the [place] graft. // Benefits check for Effient   Anticipated DC Date:  10/24/2013   Anticipated DC Plan:  [place]  CM consult      Choice offered to / List presented to:             Status of service:  Completed, signed off Medicare Important Message given?  NA - LOS <3 / Initial given by admissions (If response is "NO", the following Medicare IM given date fields will be blank) Date Medicare IM given:   Date Additional Medicare IM given:    Discharge Disposition:  HOME/SELF CARE  Per UR Regulation:    If discussed at Long Length of Stay Meetings, dates discussed:    Comments:  10/24/13 [place], RN, BSN, [employer] [phone] Spoke with pt at bedside regarding benefits check for Effient.  Pt has brochure with 30 day free card and refill assistance card intact.  Pt utilizes [facility] in [place] for prescription needs.  NCM called pharmacy to confirm availability of medication.  Information relayed to pt.  Pt verbalizes importance of filling medication upon discharge.

## 2013-10-24 NOTE — Progress Notes (Signed)
CARDIAC REHAB PHASE I   PRE:  Rate/Rhythm: 19 SR  BP:  Supine:   Sitting: 164/78  Standing:    SaO2: 95 2L  MODE:  Ambulation: 1000 ft   POST:  Rate/Rhythm: 74 SR  BP:  Supine:   Sitting: 148/80  Standing:    SaO2: 94 RA 0905-1000 On arrival pt on O2 2L sat 95%, O2 discontinued. Pt tolerated ambulation well without c/o of cp or SOB. RA sat during walk 97% and after walk 94%. Pt to side of bed after walk with call light in reach and wife present. Completed MI and stent education with pt and wife. Pt voices understanding. Pt declines Outpt. CRP due to the cost and "I can't afford it." We discussed smoking cessation with pt and wife. They both smoke. I gave pt tips for quitting, coaching contact number and quit smart class information. Pt states that he has only been succeesful at quitting for a week or two, but went back to smoking due to the habit. I have strongly encouraged cessation. He does not seem motivated. Rodney Langton RN 10/24/2013 9:56 AM

## 2013-10-24 NOTE — Progress Notes (Signed)
TR BAND REMOVAL  LOCATION:  right radial  DEFLATED PER PROTOCOL:  yes  TIME BAND OFF / DRESSING APPLIED:   2200   SITE UPON ARRIVAL:   Level 0  SITE AFTER BAND REMOVAL:  Level 0  REVERSE ALLEN'S TEST:    positive  CIRCULATION SENSATION AND MOVEMENT:  Within Normal Limits  yes  COMMENTS:     

## 2013-10-24 NOTE — Research (Signed)
Subject was given oral and written discharge instructions on the [place] prior to discharge. Study drug was given to subject with written and oral instructions and subject was instructed to return to the research clinic in 4 weeks for his first follow-up visit.  The research staff will contact the subject to make the appointment.  Blossom Hoops, RN

## 2013-10-24 NOTE — Discharge Instructions (Signed)
**  PLEASE REMEMBER TO BRING ALL OF YOUR MEDICATIONS TO EACH OF YOUR FOLLOW-UP OFFICE VISITS. ° °NO HEAVY LIFTING X 2 WEEKS. °NO SEXUAL ACTIVITY X 2 WEEKS. °NO DRIVING X 1 WEEK. °NO SOAKING BATHS, HOT TUBS, POOLS, ETC., X 7 DAYS. ° °Groin Site Care °Refer to this sheet in the next few weeks. These instructions provide you with information on caring for yourself after your procedure. Your caregiver may also give you more specific instructions. Your treatment has been planned according to current medical practices, but problems sometimes occur. Call your caregiver if you have any problems or questions after your procedure. °HOME CARE INSTRUCTIONS °· You may shower 24 hours after the procedure. Remove the bandage (dressing) and gently wash the site with plain soap and water. Gently pat the site dry.  °· Do not apply powder or lotion to the site.  °· Do not sit in a bathtub, swimming pool, or whirlpool for 5 to 7 days.  °· No bending, squatting, or lifting anything over 10 pounds (4.5 kg) as directed by your caregiver.  °· Inspect the site at least twice daily.  ° °What to expect: °· Any bruising will usually fade within 1 to 2 weeks.  °· Blood that collects in the tissue (hematoma) may be painful to the touch. It should usually decrease in size and tenderness within 1 to 2 weeks.  °SEEK IMMEDIATE MEDICAL CARE IF: °· You have unusual pain at the groin site or down the affected leg.  °· You have redness, warmth, swelling, or pain at the groin site.  °· You have drainage (other than a small amount of blood on the dressing).  °· You have chills.  °· You have a fever or persistent symptoms for more than 72 hours.  °· You have a fever and your symptoms suddenly get worse.  °· Your leg becomes pale, cool, tingly, or numb.  °You have heavy bleeding from the site. Hold pressure on the site.  °

## 2013-10-24 NOTE — Telephone Encounter (Signed)
New message      TCM appt on 11-05-13 at 2pm with Cecille Rubin per Ignacia Bayley.

## 2013-10-24 NOTE — Discharge Summary (Signed)
Discharge Summary   Patient ID: Clifford Jones,  MRN: [id number], DOB/AGE: 1951/05/23 63 y.o.  Admit date: 10/21/2013 Discharge date: 10/24/2013  Primary Care Provider: Rory Percy Primary Cardiologist: Court Joy, MD   Discharge Diagnoses Principal Problem:   NSTEMI (non-ST elevated myocardial infarction)  **S/P PCI and DES to the native RPLA via the [place] graft this admission.  He has residual VG->OM, native OM, distal LAD, and diagonal dzs, which is being medically managed.  Active Problems:   CAD (coronary artery disease)   Hypertension   Tobacco abuse   Hyperlipidemia   S/P AAA repair   Peripheral vascular disease  Allergies No Known Allergies  Procedures  Cardiac Catheterization 5.5.2015  Hemodynamics:  Central Aortic / Mean Pressures: 173/67 mmHg; 118 mmHg  Left Ventricular Pressures / EDP:  187/14 mmHg; 24 mmHg  Left Ventriculography:  EF: 55 %  Wall Motion: Mild basilar inferior hypokinesis  Coronary and graft Anatomy: Right Dominant presumably  Left Main: Normal caliber vessel with Ostial 60% narrowing as well as roughly 40% distal narrowing as it bifurcates into the LAD and Circumflex. LAD: The residual LAD is moderate caliber with about 70% stenosis prior to takeoff of a small diagonal that has a roughly 70-80% proximal stenosis. One of this diagonal Obie Dredge is has another 70-80% stenosis. Both of these if not necessarily PCI amenable. Beyond the diagonal and another septal perforator, the LAD is subtotally occluded with competitive flow from LIMA-LAD.  LIMA-LAD: Widely patent, somewhat tortuous graft that inserts into the mid LAD. There is minimal retrograde flow up the LAD, however antegrade flow reveals a very small caliber LAD with a now 90% segmental lesion just distal to the anastomosis. The LAD itself does reach down around the apex.  SVG-Diagonal: Known to be occluded from 2013, confirmed today  Left Circumflex: What amounts to be a  relatively smaller moderate caliber vessel with diffuse plaque body very small proximal OM. The vessel is an essentially occluded distally with trivial flow indicating the presence of another obtuse marginal.  SVG-OM: Large-caliber, grossly irregular graft with an ostial DES stent (02/2012) has a roughly 80% in-stent restenosis in the proximal two thirds of the stent.  The distal stent that was treated with PTCA in 02/2012 is widely patent. Downstream vessel is significantly disease is noted below, it does not perfuse a large territory.  Antegrade flow in the graft into the native circumflex shows a diffusely diseased degenerated native OM 2 with tandem 90 and 80% segmental lesions followed by a diffusely diseased distal vessel.  Retrograde flow back up the AV groove circumflex leads to flow down a third marginal/LPL branch that is very small in caliber but without significant disease.     RCA: Known to be occluded  SVG-RCA: Nonselective imaging via subclavian angiography reveals that the graft itself is widely patent down to the target lesion, however flow in the target RCA was not adequately visualized.  **The RIMA graft was unable to be visualized so arrangements were made for angiography via the right radial. _____________  Cardiac Catheterization 5.6.2015 Hemodynamics: AO 110/60 with a mean of 79  Coronary angiography: RIMA angiography: The RIMA is widely patent throughout its course. The vessel is anastomosed distally just before the bifurcation of the PDA and PLA branches. The PDA is patent with mild irregularity and 30-40% stenosis at its ostium. The PLA branch has severe 95% stenosis in the posterior AV segment. The remainder of the first PL branch has no significant disease, but there is  diffuse irregularity.   **The native RPLA was successfully stented via the RIMA graft using a 2.5 x 14 mm Resolute DES.** _____________   History of Present Illness  63 year old male with prior  history of coronary artery disease status post coronary artery bypass grafting in 2003. He has required multiple procedures within the vein graft to the obtuse marginal.  He continues to smoke.  He was in his usual state of health until the day of admission when he began to experience substernal chest discomfort without associated symptoms. In the emergency department, troponin was normal and ECG was nonacute. He was admitted for further evaluation.  Hospital Course  Patient ruled in for myocardial infarction, eventually peaking his troponin at 4.40. It was felt he would require diagnostic cardiac catheterization and this was carried out on may fifth revealing severe multivessel and graft disease as outlined above. The interventional team was unable to successfully visualize his [place] graft via the left groin and thus it was felt he would require repeat angiography to further evaluate the [place]. Further, it was felt that there were multiple potential culprits for his symptoms including significant in-stent restenosis within the vein graft to the obtuse marginal, and native obtuse marginal disease, distal LAD disease beyond the insertion of the LIMA, and native diagonal disease. Patient had no further chest discomfort post angiography and was taken back to the Cath Lab on May 6 for angiography of the RIMA and consideration of PCI. The RIMA graft itself is widely patent however the posterolateral branch had a severe 95% stenosis. This was ultimately felt to be the culprit of his symptoms and was successfully treated with a 2.5 x 14 mm Resolute drug-eluting stent. Patient tolerated procedure well and post procedure has been doing without recurrent symptoms or limitations. He has been maintained on aspirin, beta blocker, effient, and fibrate therapy.  He is not on a statin as there is a history of myalgias. Ranexa has also been added. He has been seen by cardiac rehabilitation and has been ambulating without  difficulty. He has been counseled on the importance of smoking cessation. He will be discharged home today in good condition.  Discharge Vitals Blood pressure 159/79, pulse 70, temperature 98.1 F (36.7 C), temperature source Oral, resp. rate 18, height 6' (1.829 m), weight 189 lb 2.5 oz (85.8 kg), SpO2 95.00%.  Filed Weights   10/22/13 0145 10/23/13 0600 10/24/13 0100  Weight: 186 lb 6.4 oz (84.55 kg) 187 lb 9.8 oz (85.1 kg) 189 lb 2.5 oz (85.8 kg)    Labs  CBC  Recent Labs  10/23/13 0411 10/24/13 0405  WBC 11.4* 11.9*  HGB 14.2 13.8  HCT 42.0 42.1  MCV 89.7 91.9  PLT 262 010   Basic Metabolic Panel  Recent Labs  10/23/13 0635 10/24/13 0405  NA 139 140  K 4.2 4.9  CL 102 103  CO2 26 27  GLUCOSE 129* 100*  BUN 17 21  CREATININE 0.93 1.16  CALCIUM 8.8 8.8   Liver Function Tests  Recent Labs  10/21/13 2201  AST 20  ALT 13  ALKPHOS 95  BILITOT 0.4  PROT 8.1  ALBUMIN 4.1   Cardiac Enzymes  Recent Labs  10/22/13 0622 10/22/13 0910 10/22/13 2045  TROPONINI 4.40* 3.78* 1.39*   Fasting Lipid Panel  Recent Labs  10/22/13 0622  CHOL 192  HDL 36*  LDLCALC 114*  TRIG 208*  CHOLHDL 5.3   Disposition  Pt is being discharged home today in good condition.  Follow-up Plans & Appointments  Follow-up Information   Follow up with Herminio Commons, MD On 10/31/2013. (3:40 AM)    Specialty:  Cardiology   Contact information:   [address] [place] [address] [phone]       Follow up with Rory Percy, MD. (as scheduled.)    Specialty:  Family Medicine   Contact information:   [address] [address] [phone]      Discharge Medications    Medication List         aspirin EC 81 MG tablet  Take 1 tablet (81 mg total) by mouth daily.     carvedilol 12.5 MG tablet  Commonly known as:  COREG  Take 1 tablet (12.5 mg total) by mouth 2 (two) times daily with a meal.     fenofibrate 160 MG tablet  Take 1 tablet  (160 mg total) by mouth daily.     lisinopril 20 MG tablet  Commonly known as:  PRINIVIL,ZESTRIL  Take 20 mg by mouth daily.     nitroGLYCERIN 0.4 MG SL tablet  Commonly known as:  NITROSTAT  Place 0.4 mg under the tongue every 5 (five) minutes as needed for chest pain.     prasugrel 10 MG Tabs tablet  Commonly known as:  EFFIENT  Take 1 tablet (10 mg total) by mouth daily.     ranolazine 500 MG 12 hr tablet  Commonly known as:  RANEXA  Take 1 tablet (500 mg total) by mouth 2 (two) times daily.     research study medication  Take 7.5 mg by mouth 2 (two) times daily.       Outstanding Labs/Studies  F/U lipids and lft's in 6-8 wks.  Duration of Discharge Encounter   Greater than 30 minutes including physician time.  Signed, Rogelia Mire NP 10/24/2013, 9:06 AM

## 2013-10-25 NOTE — Telephone Encounter (Signed)
Follow up appt is Oct 31, 2013 at 3:40 with Dr. Bronson Ing in [place] office.  I placed call to pt but there was no answer and no voicemail set up. I spoke with pt's wife  Patient contacted regarding discharge from  [facility] on Oct 24, 2013.  Patient understands to follow up with provider  Dr. Bronson Ing on Oct 31, 2013 at 3:40 in [place] office. Wife is aware Patient understands discharge instructions?  Wife verbalizes understanding Patient understands medications and regiment?  Wife verbalizes understanding.   Wife aware pt to bring all medications to appt.

## 2013-10-31 ENCOUNTER — Ambulatory Visit (INDEPENDENT_AMBULATORY_CARE_PROVIDER_SITE_OTHER): Payer: Medicare Other | Admitting: Cardiovascular Disease

## 2013-10-31 ENCOUNTER — Encounter: Payer: Self-pay | Admitting: Cardiovascular Disease

## 2013-10-31 VITALS — BP 145/81 | HR 50 | Ht 72.0 in | Wt 195.0 lb

## 2013-10-31 DIAGNOSIS — I252 Old myocardial infarction: Secondary | ICD-10-CM

## 2013-10-31 DIAGNOSIS — E785 Hyperlipidemia, unspecified: Secondary | ICD-10-CM

## 2013-10-31 DIAGNOSIS — Z9289 Personal history of other medical treatment: Secondary | ICD-10-CM

## 2013-10-31 DIAGNOSIS — I10 Essential (primary) hypertension: Secondary | ICD-10-CM

## 2013-10-31 DIAGNOSIS — F17200 Nicotine dependence, unspecified, uncomplicated: Secondary | ICD-10-CM

## 2013-10-31 DIAGNOSIS — Z87898 Personal history of other specified conditions: Secondary | ICD-10-CM

## 2013-10-31 DIAGNOSIS — Z720 Tobacco use: Secondary | ICD-10-CM

## 2013-10-31 DIAGNOSIS — I25810 Atherosclerosis of coronary artery bypass graft(s) without angina pectoris: Secondary | ICD-10-CM

## 2013-10-31 NOTE — Progress Notes (Signed)
Patient ID: Clifford Jones, male   DOB: 22-Dec-1950, 63 y.o.   MRN: [id number]      SUBJECTIVE: The patient presents for post hospitalization followup. He sustained a non-STEMI and is s/p PCI with DES to the native RPLA via the [place] graft. He has residual VG->OM, native OM, distal LAD, and diagonal disease, which is being medically managed.  He also has a history of hypertension, tobacco abuse, hyperlipidemia, and peripheral vascular disease and is status post AAA repair. Left ventriculography demonstrated normal left ventricular systolic function, EF 58%.  Initial CABG was in 2003.  He is feeling well and denies chest pain, shortness of breath, leg swelling, palpitations, orthopnea, dizziness, and paroxysmal nocturnal dyspnea. He has smoked 5 cigarettes in the last 8 days.   No Known Allergies  Current Outpatient Prescriptions  Medication Sig Dispense Refill  . aspirin EC 81 MG tablet Take 1 tablet (81 mg total) by mouth daily.      Marland Kitchen atorvastatin (LIPITOR) 80 MG tablet Take 80 mg by mouth daily.      . carvedilol (COREG) 12.5 MG tablet Take 1 tablet (12.5 mg total) by mouth 2 (two) times daily with a meal.  60 tablet  6  . fenofibrate 160 MG tablet Take 1 tablet (160 mg total) by mouth daily.  30 tablet  6  . lisinopril (PRINIVIL,ZESTRIL) 20 MG tablet Take 20 mg by mouth daily.      . nitroGLYCERIN (NITROSTAT) 0.4 MG SL tablet Place 1 tablet (0.4 mg total) under the tongue once.  25 tablet  3  . prasugrel (EFFIENT) 10 MG TABS tablet Take 1 tablet (10 mg total) by mouth daily.  30 tablet  6  . ranolazine (RANEXA) 500 MG 12 hr tablet Take 1 tablet (500 mg total) by mouth 2 (two) times daily.  60 tablet  6  . research study medication Take 7.5 mg by mouth 2 (two) times daily.       No current facility-administered medications for this visit.    Past Medical History  Diagnosis Date  . Hypertension   . Hyperlipidemia   . Coronary artery disease     a. 1999 Inf MI;  b. 2003 CABGx6;  c.  2008 NSTEMI, occluded SVG to marginal and diagonal system. 2 DES placed to marginal system;  c. NSTEMI - 02/2012 Xience Xpedition DES to oS-OM and POBA to mid lesion;  c. 10/2013 NSTEMI/PCI: VG->OM 80 ISR, native OM 80/90 small, LIMA->LAD ok w/ 90 dLAD, D1 80, RIMA->RPL ok w/ 95 in RPL (2.5x14 Resolute DES), nl EF.  Marland Kitchen GERD (gastroesophageal reflux disease)   . S/P AAA repair   . Tobacco abuse     Past Surgical History  Procedure Laterality Date  . Abdominal aortic aneurysm repair  2006  . Coronary artery bypass graft  2003  . Hernia repair    . Coronary angioplasty with stent placement      History   Social History  . Marital Status: Married    Spouse Name: N/A    Number of Children: N/A  . Years of Education: N/A   Occupational History  . Not on file.   Social History Main Topics  . Smoking status: Current Every Day Smoker -- 1.00 packs/day for 45 years    Types: Cigarettes    Start date: 10/04/1964  . Smokeless tobacco: Never Used  . Alcohol Use: 1.8 oz/week    3 Shots of liquor per week  . Drug Use: No  . Sexual  Activity: Yes   Other Topics Concern  . 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 Lungs: Clear to auscultation bilaterally with normal respiratory effort. CV: Nondisplaced PMI.  Regular rate and rhythm, normal S1/S2, no S3/S4, no murmur. No pretibial or periankle edema.  No carotid bruit.  Normal pedal pulses.  Abdomen: Soft, nontender, no hepatosplenomegaly, no distention.  Neurologic: Alert and oriented x 3.  Psych: Normal affect. Extremities: No clubbing or cyanosis.   ECG: reviewed and available in electronic records.      ASSESSMENT AND PLAN: 1. CAD/CABG with recent NSTEMI and PCI:  Symptomatically stable. Continue aspirin, Lipitor, Coreg, lisinopril, Effient, Ranexa, and research study medication. 2. HTN: Mildly elevated today. Will continue to monitor. He may need the lisinopril dose increased. 3. Hyperlipidemia: On high dose Lipitor.  Dispo: f/u 4 months.  Kate Sable, M.D., F.A.C.C.

## 2013-10-31 NOTE — Patient Instructions (Signed)
Your physician recommends that you schedule a follow-up appointment in: 4 months.  Your physician recommends that you continue on your current medications as directed. Please refer to the Current Medication list given to you today.  

## 2013-11-05 ENCOUNTER — Encounter: Payer: Medicare Other | Admitting: Nurse Practitioner

## 2013-11-25 ENCOUNTER — Telehealth: Payer: Self-pay | Admitting: *Deleted

## 2013-11-25 NOTE — Telephone Encounter (Signed)
Agree with keeping BP log until next visit.

## 2013-11-25 NOTE — Telephone Encounter (Signed)
States that her dad was not on the Lisinopril.  Is not sure how low he has been off.  She was not able to come with him to last OV, so not sure if he had bottles or list of meds with him.  He has OV scheduled for 12/13/2013.  Questioned if he should be on this now.  Informed her that message will be sent to provider for advice.  MD may suggest keeping log till upcoming visit before making any changes at this point.

## 2013-11-26 NOTE — Telephone Encounter (Signed)
Daughter Leveda Anna) notified.

## 2013-12-13 ENCOUNTER — Encounter: Payer: Self-pay | Admitting: Cardiovascular Disease

## 2013-12-13 ENCOUNTER — Ambulatory Visit: Payer: Medicare Other | Admitting: Cardiovascular Disease

## 2014-03-13 ENCOUNTER — Ambulatory Visit: Payer: Medicare Other | Admitting: Cardiovascular Disease

## 2014-04-10 ENCOUNTER — Ambulatory Visit: Payer: Medicare Other | Admitting: Cardiovascular Disease

## 2014-05-29 ENCOUNTER — Encounter (HOSPITAL_COMMUNITY): Payer: Self-pay | Admitting: Cardiology

## 2014-06-27 DIAGNOSIS — R0602 Shortness of breath: Secondary | ICD-10-CM | POA: Diagnosis not present

## 2014-06-27 DIAGNOSIS — Z79899 Other long term (current) drug therapy: Secondary | ICD-10-CM | POA: Diagnosis not present

## 2014-06-27 DIAGNOSIS — K219 Gastro-esophageal reflux disease without esophagitis: Secondary | ICD-10-CM | POA: Diagnosis not present

## 2014-06-27 DIAGNOSIS — F17210 Nicotine dependence, cigarettes, uncomplicated: Secondary | ICD-10-CM | POA: Diagnosis not present

## 2014-06-27 DIAGNOSIS — Z951 Presence of aortocoronary bypass graft: Secondary | ICD-10-CM | POA: Diagnosis not present

## 2014-06-27 DIAGNOSIS — I2510 Atherosclerotic heart disease of native coronary artery without angina pectoris: Secondary | ICD-10-CM | POA: Diagnosis not present

## 2014-06-27 DIAGNOSIS — R0689 Other abnormalities of breathing: Secondary | ICD-10-CM | POA: Diagnosis not present

## 2014-06-27 DIAGNOSIS — J449 Chronic obstructive pulmonary disease, unspecified: Secondary | ICD-10-CM | POA: Diagnosis not present

## 2014-06-27 DIAGNOSIS — J45909 Unspecified asthma, uncomplicated: Secondary | ICD-10-CM | POA: Diagnosis not present

## 2014-06-27 DIAGNOSIS — R0902 Hypoxemia: Secondary | ICD-10-CM | POA: Diagnosis not present

## 2014-06-27 DIAGNOSIS — Z7982 Long term (current) use of aspirin: Secondary | ICD-10-CM | POA: Diagnosis not present

## 2014-06-27 DIAGNOSIS — J441 Chronic obstructive pulmonary disease with (acute) exacerbation: Secondary | ICD-10-CM | POA: Diagnosis not present

## 2014-06-27 DIAGNOSIS — J189 Pneumonia, unspecified organism: Secondary | ICD-10-CM | POA: Diagnosis not present

## 2014-06-27 DIAGNOSIS — I252 Old myocardial infarction: Secondary | ICD-10-CM | POA: Diagnosis not present

## 2014-06-27 DIAGNOSIS — R05 Cough: Secondary | ICD-10-CM | POA: Diagnosis not present

## 2014-06-27 DIAGNOSIS — I10 Essential (primary) hypertension: Secondary | ICD-10-CM | POA: Diagnosis not present

## 2014-06-28 DIAGNOSIS — I2510 Atherosclerotic heart disease of native coronary artery without angina pectoris: Secondary | ICD-10-CM | POA: Diagnosis not present

## 2014-06-28 DIAGNOSIS — I10 Essential (primary) hypertension: Secondary | ICD-10-CM | POA: Diagnosis not present

## 2014-06-28 DIAGNOSIS — Z79899 Other long term (current) drug therapy: Secondary | ICD-10-CM | POA: Diagnosis not present

## 2014-06-28 DIAGNOSIS — Z7982 Long term (current) use of aspirin: Secondary | ICD-10-CM | POA: Diagnosis not present

## 2014-06-28 DIAGNOSIS — K219 Gastro-esophageal reflux disease without esophagitis: Secondary | ICD-10-CM | POA: Diagnosis not present

## 2014-06-28 DIAGNOSIS — J441 Chronic obstructive pulmonary disease with (acute) exacerbation: Secondary | ICD-10-CM | POA: Diagnosis not present

## 2014-06-28 DIAGNOSIS — Z951 Presence of aortocoronary bypass graft: Secondary | ICD-10-CM | POA: Diagnosis not present

## 2014-06-28 DIAGNOSIS — F17210 Nicotine dependence, cigarettes, uncomplicated: Secondary | ICD-10-CM | POA: Diagnosis not present

## 2014-06-28 DIAGNOSIS — J45909 Unspecified asthma, uncomplicated: Secondary | ICD-10-CM | POA: Diagnosis not present

## 2014-06-28 DIAGNOSIS — I252 Old myocardial infarction: Secondary | ICD-10-CM | POA: Diagnosis not present

## 2014-06-29 DIAGNOSIS — I252 Old myocardial infarction: Secondary | ICD-10-CM | POA: Diagnosis not present

## 2014-06-29 DIAGNOSIS — K219 Gastro-esophageal reflux disease without esophagitis: Secondary | ICD-10-CM | POA: Diagnosis not present

## 2014-06-29 DIAGNOSIS — J45909 Unspecified asthma, uncomplicated: Secondary | ICD-10-CM | POA: Diagnosis not present

## 2014-06-29 DIAGNOSIS — F17210 Nicotine dependence, cigarettes, uncomplicated: Secondary | ICD-10-CM | POA: Diagnosis not present

## 2014-06-29 DIAGNOSIS — I10 Essential (primary) hypertension: Secondary | ICD-10-CM | POA: Diagnosis not present

## 2014-06-29 DIAGNOSIS — Z79899 Other long term (current) drug therapy: Secondary | ICD-10-CM | POA: Diagnosis not present

## 2014-06-29 DIAGNOSIS — Z951 Presence of aortocoronary bypass graft: Secondary | ICD-10-CM | POA: Diagnosis not present

## 2014-06-29 DIAGNOSIS — J441 Chronic obstructive pulmonary disease with (acute) exacerbation: Secondary | ICD-10-CM | POA: Diagnosis not present

## 2014-06-29 DIAGNOSIS — I2510 Atherosclerotic heart disease of native coronary artery without angina pectoris: Secondary | ICD-10-CM | POA: Diagnosis not present

## 2014-06-29 DIAGNOSIS — Z7982 Long term (current) use of aspirin: Secondary | ICD-10-CM | POA: Diagnosis not present

## 2014-07-14 ENCOUNTER — Other Ambulatory Visit: Payer: Self-pay | Admitting: Nurse Practitioner

## 2014-07-28 DIAGNOSIS — E780 Pure hypercholesterolemia: Secondary | ICD-10-CM | POA: Diagnosis not present

## 2014-07-28 DIAGNOSIS — F17210 Nicotine dependence, cigarettes, uncomplicated: Secondary | ICD-10-CM | POA: Diagnosis not present

## 2014-07-28 DIAGNOSIS — Z955 Presence of coronary angioplasty implant and graft: Secondary | ICD-10-CM | POA: Diagnosis not present

## 2014-07-28 DIAGNOSIS — Z23 Encounter for immunization: Secondary | ICD-10-CM | POA: Diagnosis not present

## 2014-07-28 DIAGNOSIS — I10 Essential (primary) hypertension: Secondary | ICD-10-CM | POA: Diagnosis not present

## 2015-02-28 DIAGNOSIS — I214 Non-ST elevation (NSTEMI) myocardial infarction: Secondary | ICD-10-CM | POA: Diagnosis not present

## 2015-02-28 DIAGNOSIS — Z7951 Long term (current) use of inhaled steroids: Secondary | ICD-10-CM | POA: Diagnosis not present

## 2015-02-28 DIAGNOSIS — E785 Hyperlipidemia, unspecified: Secondary | ICD-10-CM | POA: Diagnosis not present

## 2015-02-28 DIAGNOSIS — I2510 Atherosclerotic heart disease of native coronary artery without angina pectoris: Secondary | ICD-10-CM | POA: Diagnosis not present

## 2015-02-28 DIAGNOSIS — Z951 Presence of aortocoronary bypass graft: Secondary | ICD-10-CM | POA: Diagnosis not present

## 2015-02-28 DIAGNOSIS — R9431 Abnormal electrocardiogram [ECG] [EKG]: Secondary | ICD-10-CM | POA: Diagnosis not present

## 2015-02-28 DIAGNOSIS — R079 Chest pain, unspecified: Secondary | ICD-10-CM | POA: Diagnosis not present

## 2015-02-28 DIAGNOSIS — I10 Essential (primary) hypertension: Secondary | ICD-10-CM | POA: Diagnosis not present

## 2015-02-28 DIAGNOSIS — Z955 Presence of coronary angioplasty implant and graft: Secondary | ICD-10-CM | POA: Diagnosis not present

## 2015-02-28 DIAGNOSIS — Z7982 Long term (current) use of aspirin: Secondary | ICD-10-CM | POA: Diagnosis not present

## 2015-02-28 DIAGNOSIS — F17200 Nicotine dependence, unspecified, uncomplicated: Secondary | ICD-10-CM | POA: Diagnosis not present

## 2015-03-01 ENCOUNTER — Encounter (HOSPITAL_COMMUNITY): Payer: Self-pay | Admitting: *Deleted

## 2015-03-01 ENCOUNTER — Inpatient Hospital Stay (HOSPITAL_COMMUNITY)
Admission: AD | Admit: 2015-03-01 | Discharge: 2015-03-03 | DRG: 282 | Disposition: A | Discharge status: Home / Self Care | Payer: Commercial Managed Care - HMO | Source: Other Acute Inpatient Hospital | Attending: Internal Medicine | Admitting: Internal Medicine

## 2015-03-01 DIAGNOSIS — R7989 Other specified abnormal findings of blood chemistry: Secondary | ICD-10-CM

## 2015-03-01 DIAGNOSIS — R0789 Other chest pain: Secondary | ICD-10-CM

## 2015-03-01 DIAGNOSIS — R778 Other specified abnormalities of plasma proteins: Secondary | ICD-10-CM | POA: Diagnosis present

## 2015-03-01 DIAGNOSIS — Z79899 Other long term (current) drug therapy: Secondary | ICD-10-CM | POA: Diagnosis not present

## 2015-03-01 DIAGNOSIS — I25810 Atherosclerosis of coronary artery bypass graft(s) without angina pectoris: Secondary | ICD-10-CM | POA: Diagnosis present

## 2015-03-01 DIAGNOSIS — Z720 Tobacco use: Secondary | ICD-10-CM | POA: Diagnosis not present

## 2015-03-01 DIAGNOSIS — Z7982 Long term (current) use of aspirin: Secondary | ICD-10-CM | POA: Diagnosis not present

## 2015-03-01 DIAGNOSIS — F17200 Nicotine dependence, unspecified, uncomplicated: Secondary | ICD-10-CM | POA: Diagnosis not present

## 2015-03-01 DIAGNOSIS — Z8249 Family history of ischemic heart disease and other diseases of the circulatory system: Secondary | ICD-10-CM

## 2015-03-01 DIAGNOSIS — E785 Hyperlipidemia, unspecified: Secondary | ICD-10-CM | POA: Diagnosis present

## 2015-03-01 DIAGNOSIS — I119 Hypertensive heart disease without heart failure: Secondary | ICD-10-CM | POA: Diagnosis not present

## 2015-03-01 DIAGNOSIS — I10 Essential (primary) hypertension: Secondary | ICD-10-CM | POA: Diagnosis not present

## 2015-03-01 DIAGNOSIS — I214 Non-ST elevation (NSTEMI) myocardial infarction: Secondary | ICD-10-CM | POA: Diagnosis not present

## 2015-03-01 DIAGNOSIS — I25119 Atherosclerotic heart disease of native coronary artery with unspecified angina pectoris: Secondary | ICD-10-CM

## 2015-03-01 DIAGNOSIS — K219 Gastro-esophageal reflux disease without esophagitis: Secondary | ICD-10-CM | POA: Diagnosis not present

## 2015-03-01 DIAGNOSIS — Z951 Presence of aortocoronary bypass graft: Secondary | ICD-10-CM | POA: Diagnosis not present

## 2015-03-01 DIAGNOSIS — R079 Chest pain, unspecified: Secondary | ICD-10-CM

## 2015-03-01 DIAGNOSIS — Z955 Presence of coronary angioplasty implant and graft: Secondary | ICD-10-CM | POA: Diagnosis not present

## 2015-03-01 DIAGNOSIS — F17210 Nicotine dependence, cigarettes, uncomplicated: Secondary | ICD-10-CM | POA: Diagnosis not present

## 2015-03-01 DIAGNOSIS — I25700 Atherosclerosis of coronary artery bypass graft(s), unspecified, with unstable angina pectoris: Secondary | ICD-10-CM | POA: Diagnosis not present

## 2015-03-01 DIAGNOSIS — Z7951 Long term (current) use of inhaled steroids: Secondary | ICD-10-CM | POA: Diagnosis not present

## 2015-03-01 DIAGNOSIS — I739 Peripheral vascular disease, unspecified: Secondary | ICD-10-CM | POA: Diagnosis not present

## 2015-03-01 DIAGNOSIS — I2510 Atherosclerotic heart disease of native coronary artery without angina pectoris: Secondary | ICD-10-CM | POA: Diagnosis not present

## 2015-03-01 LAB — PROTIME-INR
INR: 1.01 (ref 0.00–1.49)
Prothrombin Time: 13.5 seconds (ref 11.6–15.2)

## 2015-03-01 LAB — TROPONIN I: TROPONIN I: 0.6 ng/mL — AB (ref <0.031)

## 2015-03-01 LAB — HEPARIN LEVEL (UNFRACTIONATED): HEPARIN UNFRACTIONATED: 0.33 [IU]/mL (ref 0.30–0.70)

## 2015-03-01 LAB — MRSA PCR SCREENING: MRSA by PCR: NEGATIVE

## 2015-03-01 MED ORDER — LISINOPRIL 20 MG PO TABS
20.0000 mg | ORAL_TABLET | Freq: Every day | ORAL | Status: DC
  Administered 2015-03-01 – 2015-03-03 (×3): 20 mg via ORAL
  Filled 2015-03-01 (×3): qty 1

## 2015-03-01 MED ORDER — ACETAMINOPHEN 325 MG PO TABS: 650.0000 mg | ORAL_TABLET | ORAL | Status: DC | PRN

## 2015-03-01 MED ORDER — SODIUM CHLORIDE 0.9 % IV SOLN: 250.0000 mL | INTRAVENOUS | Status: DC | PRN

## 2015-03-01 MED ORDER — HEPARIN (PORCINE) IN NACL 100-0.45 UNIT/ML-% IJ SOLN
1400.0000 [IU]/h | INTRAMUSCULAR | Status: DC
  Administered 2015-03-02 – 2015-03-03 (×2): 1350 [IU]/h via INTRAVENOUS
  Filled 2015-03-01 (×2): qty 250

## 2015-03-01 MED ORDER — ASPIRIN EC 81 MG PO TBEC
81.0000 mg | DELAYED_RELEASE_TABLET | Freq: Every day | ORAL | Status: DC
  Administered 2015-03-02 – 2015-03-03 (×2): 81 mg via ORAL
  Filled 2015-03-01 (×2): qty 1

## 2015-03-01 MED ORDER — FENOFIBRATE 160 MG PO TABS
160.0000 mg | ORAL_TABLET | Freq: Every day | ORAL | Status: DC
  Administered 2015-03-02: 160 mg via ORAL
  Filled 2015-03-01: qty 1

## 2015-03-01 MED ORDER — NITROGLYCERIN 0.4 MG SL SUBL: 0.4000 mg | SUBLINGUAL_TABLET | SUBLINGUAL | Status: DC | PRN

## 2015-03-01 MED ORDER — ONDANSETRON HCL 4 MG/2ML IJ SOLN: 4.0000 mg | Freq: Four times a day (QID) | INTRAMUSCULAR | Status: DC | PRN

## 2015-03-01 MED ORDER — STUDY - INVESTIGATIONAL DRUG SIMPLE RECORD: 7.5000 mg | Freq: Two times a day (BID) | Status: DC

## 2015-03-01 MED ORDER — ASPIRIN EC 81 MG PO TBEC: 81.0000 mg | DELAYED_RELEASE_TABLET | Freq: Every day | ORAL | Status: DC

## 2015-03-01 MED ORDER — SODIUM CHLORIDE 0.9 % IJ SOLN
3.0000 mL | Freq: Two times a day (BID) | INTRAMUSCULAR | Status: DC
  Administered 2015-03-01 – 2015-03-03 (×4): 3 mL via INTRAVENOUS

## 2015-03-01 MED ORDER — ASPIRIN 300 MG RE SUPP: 300.0000 mg | RECTAL | Status: AC

## 2015-03-01 MED ORDER — SIMVASTATIN 20 MG PO TABS
40.0000 mg | ORAL_TABLET | Freq: Every day | ORAL | Status: DC
  Administered 2015-03-01 – 2015-03-02 (×2): 40 mg via ORAL
  Filled 2015-03-01 (×2): qty 2

## 2015-03-01 MED ORDER — SODIUM CHLORIDE 0.9 % IJ SOLN: 3.0000 mL | INTRAMUSCULAR | Status: DC | PRN

## 2015-03-01 MED ORDER — NITROGLYCERIN IN D5W 200-5 MCG/ML-% IV SOLN
0.0000 ug/min | INTRAVENOUS | Status: DC
  Administered 2015-03-01: 10 ug/min via INTRAVENOUS

## 2015-03-01 MED ORDER — ASPIRIN 81 MG PO CHEW
324.0000 mg | CHEWABLE_TABLET | ORAL | Status: AC
Start: 2015-03-01 — End: 2015-03-01
  Administered 2015-03-01: 324 mg via ORAL
  Filled 2015-03-01: qty 4

## 2015-03-01 MED ORDER — PRASUGREL HCL 10 MG PO TABS
10.0000 mg | ORAL_TABLET | Freq: Every day | ORAL | Status: DC
  Administered 2015-03-02 – 2015-03-03 (×2): 10 mg via ORAL
  Filled 2015-03-01 (×2): qty 1

## 2015-03-01 MED ORDER — CARVEDILOL 12.5 MG PO TABS: 12.5000 mg | ORAL_TABLET | Freq: Two times a day (BID) | ORAL | Status: DC

## 2015-03-01 MED ORDER — NITROGLYCERIN IN D5W 200-5 MCG/ML-% IV SOLN
INTRAVENOUS | Status: AC
Start: 2015-03-01 — End: 2015-03-01
  Filled 2015-03-01: qty 250

## 2015-03-01 MED ORDER — RANOLAZINE ER 500 MG PO TB12
500.0000 mg | ORAL_TABLET | Freq: Two times a day (BID) | ORAL | Status: DC
  Administered 2015-03-01 – 2015-03-02 (×2): 500 mg via ORAL
  Filled 2015-03-01 (×2): qty 1

## 2015-03-01 NOTE — H&P (Signed)
Patient ID: EFOSA TREICHLER MRN: [id number], DOB/AGE: 62-20-1952   Admit date: 03/01/2015   Primary Physician: Rory Percy, MD Primary Cardiologist: Dr. Bronson Ing   Pt. Profile:  Clifford Jones is a 64 y.o. male with a history of CAD s/p CABG x6V (2006) and multiple stents, AAA s/p repair, HTN, HLD, tobacco abuse, and PVD who was transferred from [facility] today for chest pain and elevated troponin.    1999 Inf MI;  b. 2003 CABGx6;  c. 2008 NSTEMI, occluded SVG to marginal and diagonal system. 2 DES placed to marginal system;  c. NSTEMI - 02/2012 Xience Xpedition DES to oS-OM and POBA to mid lesion;  c. 10/2013 NSTEMI/PCI: VG->OM 80 ISR, native OM 80/90 small, LIMA->LAD ok w/ 90 dLAD, D1 80, RIMA->RPL ok w/ 95 in RPL (2.5x14 Resolute DES), nl EF.  He was last seen by Dr. Bronson Ing in 10/2013 for post hospital follow up after a NSTEMI and PCI with DES to the native RPLA via the [place] graft. He had residual VG->OM, native OM, distal LAD, and diagonal disease, which was medically managed. He remains on DAPT with ASA/Effient. He has not been seen since and has been doing well with no problems. He continue to smoke 1 PPD. He was in his USOH until last night when he had sudden onset of left sided pressure like chest pain. Not associated with SOB, diaphoresis, or nausea. He took 1 SL NTG with complete relief and then the chest pressure returned about 45 minutes later. He took another SL NTG and went to [facility] for evaluation. At [facility] his troponin was 0.01-->0.02-->0.14 --> 0.12 and ECG with no acute ST or TW changes. The patient asked to be transferred to [facility] so he could see his cardiology group.     Problem List  Past Medical History  Diagnosis Date  . Hypertension   . Hyperlipidemia   . Coronary artery disease     a. 1999 Inf MI;  b. 2003 CABGx6;  c. 2008 NSTEMI, occluded SVG to marginal and diagonal system. 2 DES placed to marginal system;  c. NSTEMI - 02/2012 Xience  Xpedition DES to oS-OM and POBA to mid lesion;  c. 10/2013 NSTEMI/PCI: VG->OM 80 ISR, native OM 80/90 small, LIMA->LAD ok w/ 90 dLAD, D1 80, RIMA->RPL ok w/ 95 in RPL (2.5x14 Resolute DES), nl EF.  Marland Kitchen GERD (gastroesophageal reflux disease)   . S/P AAA repair   . Tobacco abuse     Past Surgical History  Procedure Laterality Date  . Abdominal aortic aneurysm repair  2006  . Coronary artery bypass graft  2003  . Hernia repair    . Coronary angioplasty with stent placement    . Left heart catheterization with coronary angiogram N/A 03/05/2012    Procedure: LEFT HEART CATHETERIZATION WITH CORONARY ANGIOGRAM;  Surgeon: Hillary Bow, MD;  Location: [facility] CATH LAB;  Service: Cardiovascular;  Laterality: N/A;  . Percutaneous coronary stent intervention (pci-s) N/A 03/07/2012    Procedure: PERCUTANEOUS CORONARY STENT INTERVENTION (PCI-S);  Surgeon: Sherren Mocha, MD;  Location: [facility] CATH LAB;  Service: Cardiovascular;  Laterality: N/A;  . Left heart catheterization with coronary/graft angiogram  10/22/2013    Procedure: LEFT HEART CATHETERIZATION WITH Beatrix Fetters;  Surgeon: Leonie Man, MD;  Location: [facility] CATH LAB;  Service: Cardiovascular;;  . Left heart catheterization with coronary angiogram N/A 10/23/2013    Procedure: LEFT HEART CATHETERIZATION WITH CORONARY ANGIOGRAM;  Surgeon: Blane Ohara, MD;  Location: [facility] CATH  LAB;  Service: Cardiovascular;  Laterality: N/A;  . Percutaneous coronary stent intervention (pci-s) Right 10/23/2013    Procedure: PERCUTANEOUS CORONARY STENT INTERVENTION (PCI-S);  Surgeon: Blane Ohara, MD;  Location: [facility] CATH LAB;  Service: Cardiovascular;  Laterality: Right;     Allergies  No Known Allergies   Home Medications  Prior to Admission medications   Medication Sig Start Date End Date Taking? Authorizing Provider  aspirin EC 81 MG tablet Take 1 tablet (81 mg total) by mouth daily. 10/24/13   Rogelia Mire, NP  atorvastatin (LIPITOR) 80 MG tablet  Take 80 mg by mouth daily.    Historical Provider, MD  carvedilol (COREG) 12.5 MG tablet TAKE 1 TABLET BY MOUTH TWICE DAILY WITH A MEAL 07/14/14   Satira Sark, MD  EFFIENT 10 MG TABS tablet TAKE 1 TABLET BY MOUTH DAILY 07/14/14   Satira Sark, MD  fenofibrate 160 MG tablet TAKE 1 TABLET BY MOUTH DAILY 07/14/14   Satira Sark, MD  lisinopril (PRINIVIL,ZESTRIL) 20 MG tablet Take 20 mg by mouth daily.    Historical Provider, MD  nitroGLYCERIN (NITROSTAT) 0.4 MG SL tablet Place 1 tablet (0.4 mg total) under the tongue once. 10/24/13   Rogelia Mire, NP  RANEXA 500 MG 12 hr tablet TAKE 1 TABLET BY MOUTH TWICE DAILY 07/14/14   Satira Sark, MD  research study medication Take 7.5 mg by mouth 2 (two) times daily. 10/24/13   Rogelia Mire, NP    Family History  Family History  Problem Relation Age of Onset  . Coronary artery disease     Family Status  Relation Status Death Age  . Father Deceased 29    died of MI  . Mother Deceased 73    unknown causes  . Brother Deceased     unknown  . Sister Deceased     unknown  . Sister Alive      Social History  Social History   Social History  . Marital Status: Married    Spouse Name: N/A  . Number of Children: N/A  . Years of Education: N/A   Occupational History  . Not on file.   Social History Main Topics  . Smoking status: Current Every Day Smoker -- 1.00 packs/day for 45 years    Types: Cigarettes    Start date: 10/04/1964  . Smokeless tobacco: Never Used  . Alcohol Use: 1.8 oz/week    3 Shots of liquor per week  . Drug Use: No  . Sexual Activity: Yes   Other Topics Concern  . 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  Physical Exam  Blood pressure 202/87, resp. rate 17, height 6' (1.829 m), weight 188 lb (85.276 kg), SpO2 99 %.    General: Pleasant, NAD Psych: Normal affect. Neuro: Alert and oriented X 3. Moves all extremities spontaneously. HEENT: Normal  Neck: Supple without bruits or JVD. Lungs:  Resp regular and unlabored, CTA. Heart: RRR no s3, s4, or murmurs. Abdomen: Soft, non-tender, non-distended, BS + x 4.  Extremities: No clubbing, cyanosis or edema. DP/PT/Radials 2+ and equal bilaterally.  Labs  Troponin 0.01-->0.02-->0.14 --> 0.12  Crea .84. GFR >60   Radiology/Studies  No results found.   ASSESSMENT AND PLAN  Clifford Jones is  a 64 y.o. male with a history of CAD s/p CABG x6V (2006) and multiple stents, AAA s/p repair, HTN, HLD, tobacco abuse, and PVD who was transferred from [facility] today for chest pain and elevated troponin.   NSTEMI - troponin: 0.01-->0.02-->0.14 --> 0.12 at [facility]. ECG with some non specific changes. Some new TWI in I and AVL. Will cycle enzymes here -- No further chest pain  -- Will get 2D ECHO and plan for myoview unless his troponin continues to rise or he has more chest pain.  -- Will add simvastatin. Discontinue Tricor. (myalgias with atorva) Continue BB and ASA/EFfient  HTN- BP elevated here at 187/79. Continue coreg 12.5mg  BID and will add back lisinopril 20mg  and consider adding amlodipine if BP doesn't improve.    Tobacco abuse- continues to smoke 1PPD. Counseled on cessation.   Signed, Eileen Stanford, PA-C 03/01/2015, 4:19 PM  Pager [phone]   I have seen, examined the patient, and reviewed the above assessment and plan.  On exam, comfortable appearing, RRR. Changes to above are made where necessary.  Lifestyle modification would go a long way.  Smoking cessation encouraged.  Would anticipate medicine optimization and myoview in am.  Co Sign: Thompson Grayer, MD 03/01/2015 5:00 PM

## 2015-03-01 NOTE — Progress Notes (Signed)
MD on-call re-awared of positive troponin. Pt is chest pain free.

## 2015-03-01 NOTE — Consult Note (Addendum)
ANTICOAGULATION CONSULT NOTE - Initial Consult  Pharmacy Consult for Heparin Indication: chest pain/ACS  No Known Allergies  Patient Measurements: Height: 6' (182.9 cm) Weight: 188 lb (85.276 kg) IBW/kg (Calculated) : 77.6  Vital Signs: Temp: 98 F (36.7 C) (09/11 1615) Temp Source: Oral (09/11 1615) BP: 187/79 mmHg (09/11 1615) Pulse Rate: 51 (09/11 1615)  Labs: No results for input(s): HGB, HCT, PLT, APTT, LABPROT, INR, HEPARINUNFRC, CREATININE, CKTOTAL, CKMB, TROPONINI in the last 72 hours.  CrCl cannot be calculated (Patient has no serum creatinine result on file.).   Medical History: Past Medical History  Diagnosis Date  . Hypertension   . Hyperlipidemia   . Coronary artery disease     a. 1999 Inf MI;  b. 2003 CABGx6;  c. 2008 NSTEMI, occluded SVG to marginal and diagonal system. 2 DES placed to marginal system;  c. NSTEMI - 02/2012 Xience Xpedition DES to oS-OM and POBA to mid lesion;  c. 10/2013 NSTEMI/PCI: VG->OM 80 ISR, native OM 80/90 small, LIMA->LAD ok w/ 90 dLAD, D1 80, RIMA->RPL ok w/ 95 in RPL (2.5x14 Resolute DES), nl EF.  Marland Kitchen GERD (gastroesophageal reflux disease)   . S/P AAA repair   . Tobacco abuse     Medications:  Heparin @ 1360 units/hr  Assessment: 64yom w/ history of CAD s/p CABG in 2006 transferred from [facility] to [facility] with chest pain and elevated troponin. Heparin started at [facility] ~ 0800 (received 5000 unit bolus then drip at 1360 units/hr). Plan for [facility] tomorrow morning.  Goal of Therapy:  Heparin level 0.3-0.7 units/ml Monitor platelets by anticoagulation protocol: Yes   Plan:  1) Check heparin level now  Deboraha Sprang 03/01/2015,5:26 PM   Addendum: Initial heparin level is therapeutic at 0.33. Continue heparin at 1350 units/hr and follow up AM labs.  Deboraha Sprang 03/01/2015, 7:35 PM

## 2015-03-02 ENCOUNTER — Observation Stay (HOSPITAL_COMMUNITY): Payer: Commercial Managed Care - HMO

## 2015-03-02 ENCOUNTER — Encounter (HOSPITAL_COMMUNITY): Payer: Self-pay

## 2015-03-02 DIAGNOSIS — I2510 Atherosclerotic heart disease of native coronary artery without angina pectoris: Secondary | ICD-10-CM | POA: Diagnosis present

## 2015-03-02 DIAGNOSIS — I25700 Atherosclerosis of coronary artery bypass graft(s), unspecified, with unstable angina pectoris: Secondary | ICD-10-CM | POA: Diagnosis not present

## 2015-03-02 DIAGNOSIS — R7989 Other specified abnormal findings of blood chemistry: Secondary | ICD-10-CM | POA: Diagnosis not present

## 2015-03-02 DIAGNOSIS — R778 Other specified abnormalities of plasma proteins: Secondary | ICD-10-CM | POA: Diagnosis present

## 2015-03-02 DIAGNOSIS — I10 Essential (primary) hypertension: Secondary | ICD-10-CM | POA: Diagnosis present

## 2015-03-02 DIAGNOSIS — I739 Peripheral vascular disease, unspecified: Secondary | ICD-10-CM | POA: Diagnosis present

## 2015-03-02 DIAGNOSIS — K219 Gastro-esophageal reflux disease without esophagitis: Secondary | ICD-10-CM | POA: Diagnosis present

## 2015-03-02 DIAGNOSIS — I214 Non-ST elevation (NSTEMI) myocardial infarction: Secondary | ICD-10-CM | POA: Diagnosis present

## 2015-03-02 DIAGNOSIS — E785 Hyperlipidemia, unspecified: Secondary | ICD-10-CM | POA: Diagnosis present

## 2015-03-02 DIAGNOSIS — R079 Chest pain, unspecified: Secondary | ICD-10-CM | POA: Diagnosis not present

## 2015-03-02 DIAGNOSIS — Z8249 Family history of ischemic heart disease and other diseases of the circulatory system: Secondary | ICD-10-CM | POA: Diagnosis not present

## 2015-03-02 DIAGNOSIS — F17210 Nicotine dependence, cigarettes, uncomplicated: Secondary | ICD-10-CM | POA: Diagnosis present

## 2015-03-02 DIAGNOSIS — Z7982 Long term (current) use of aspirin: Secondary | ICD-10-CM | POA: Diagnosis not present

## 2015-03-02 DIAGNOSIS — Z79899 Other long term (current) drug therapy: Secondary | ICD-10-CM | POA: Diagnosis not present

## 2015-03-02 LAB — COMPREHENSIVE METABOLIC PANEL
ALT: 9 U/L — ABNORMAL LOW (ref 17–63)
AST: 18 U/L (ref 15–41)
Albumin: 3.3 g/dL — ABNORMAL LOW (ref 3.5–5.0)
Alkaline Phosphatase: 57 U/L (ref 38–126)
Anion gap: 6 (ref 5–15)
BILIRUBIN TOTAL: 0.5 mg/dL (ref 0.3–1.2)
BUN: 16 mg/dL (ref 6–20)
CO2: 28 mmol/L (ref 22–32)
CREATININE: 1.02 mg/dL (ref 0.61–1.24)
Calcium: 8.9 mg/dL (ref 8.9–10.3)
Chloride: 102 mmol/L (ref 101–111)
Glucose, Bld: 128 mg/dL — ABNORMAL HIGH (ref 65–99)
POTASSIUM: 4.2 mmol/L (ref 3.5–5.1)
Sodium: 136 mmol/L (ref 135–145)
TOTAL PROTEIN: 5.9 g/dL — AB (ref 6.5–8.1)

## 2015-03-02 LAB — NM MYOCAR MULTI W/SPECT W/WALL MOTION / EF
CHL CUP MPHR: 158 {beats}/min
CHL CUP NUCLEAR SDS: 4
CHL CUP NUCLEAR SSS: 15
CSEPHR: 46 %
Estimated workload: 1 METS
Exercise duration (min): 0 min
Exercise duration (sec): 0 s
LHR: 0.24
LV sys vol: 100 mL
LVDIAVOL: 163 mL
Peak HR: 73 {beats}/min
Rest HR: 46 {beats}/min
SRS: 11
TID: 1.27

## 2015-03-02 LAB — CBC
HEMATOCRIT: 42.8 % (ref 39.0–52.0)
HEMOGLOBIN: 14.2 g/dL (ref 13.0–17.0)
MCH: 30.8 pg (ref 26.0–34.0)
MCHC: 33.2 g/dL (ref 30.0–36.0)
MCV: 92.8 fL (ref 78.0–100.0)
Platelets: 256 10*3/uL (ref 150–400)
RBC: 4.61 MIL/uL (ref 4.22–5.81)
RDW: 12.8 % (ref 11.5–15.5)
WBC: 9.4 10*3/uL (ref 4.0–10.5)

## 2015-03-02 LAB — LIPID PANEL
CHOL/HDL RATIO: 4.9 ratio
Cholesterol: 161 mg/dL (ref 0–200)
HDL: 33 mg/dL — ABNORMAL LOW (ref >40)
LDL CALC: 69 mg/dL (ref 0–99)
Triglycerides: 294 mg/dL — ABNORMAL HIGH (ref <150)
VLDL: 59 mg/dL — AB (ref 0–40)

## 2015-03-02 LAB — HEMOGLOBIN A1C
Hgb A1c MFr Bld: 6 % — ABNORMAL HIGH (ref 4.8–5.6)
Mean Plasma Glucose: 126 mg/dL

## 2015-03-02 LAB — TROPONIN I
TROPONIN I: 0.46 ng/mL — AB (ref <0.031)
TROPONIN I: 0.36 ng/mL — AB (ref <0.031)

## 2015-03-02 LAB — HEPARIN LEVEL (UNFRACTIONATED): HEPARIN UNFRACTIONATED: 0.34 [IU]/mL (ref 0.30–0.70)

## 2015-03-02 LAB — PROTIME-INR
INR: 1.04 (ref 0.00–1.49)
PROTHROMBIN TIME: 13.8 s (ref 11.6–15.2)

## 2015-03-02 MED ORDER — CARVEDILOL 6.25 MG PO TABS
6.2500 mg | ORAL_TABLET | Freq: Two times a day (BID) | ORAL | Status: DC
  Administered 2015-03-02 – 2015-03-03 (×3): 6.25 mg via ORAL
  Filled 2015-03-02 (×3): qty 1

## 2015-03-02 MED ORDER — REGADENOSON 0.4 MG/5ML IV SOLN
0.4000 mg | Freq: Once | INTRAVENOUS | Status: AC
Start: 2015-03-02 — End: 2015-03-02
  Administered 2015-03-02: 0.4 mg via INTRAVENOUS

## 2015-03-02 MED ORDER — RANOLAZINE ER 500 MG PO TB12
1000.0000 mg | ORAL_TABLET | Freq: Two times a day (BID) | ORAL | Status: DC
  Administered 2015-03-02 – 2015-03-03 (×2): 1000 mg via ORAL
  Filled 2015-03-02 (×2): qty 2

## 2015-03-02 MED ORDER — REGADENOSON 0.4 MG/5ML IV SOLN
INTRAVENOUS | Status: AC
  Filled 2015-03-02: qty 5

## 2015-03-02 MED ORDER — TECHNETIUM TC 99M SESTAMIBI GENERIC - CARDIOLITE
30.0000 | Freq: Once | INTRAVENOUS | Status: AC | PRN
  Administered 2015-03-02: 30 via INTRAVENOUS

## 2015-03-02 MED ORDER — TECHNETIUM TC 99M SESTAMIBI GENERIC - CARDIOLITE
10.0000 | Freq: Once | INTRAVENOUS | Status: AC | PRN
  Administered 2015-03-02: 10 via INTRAVENOUS

## 2015-03-02 MED FILL — Heparin Sodium (Porcine) 100 Unt/ML in Sodium Chloride 0.45%: INTRAMUSCULAR | Qty: 250 | Status: AC

## 2015-03-02 NOTE — Progress Notes (Signed)
Echocardiogram 2D Echocardiogram has been performed.  Clifford Jones 03/02/2015, 1:01 PM

## 2015-03-02 NOTE — Progress Notes (Signed)
1 day Annex completed. No major complications were experienced during the procedure.  Will be read by [facility]. Official results are pending.  Signed, Erma Heritage, PA-C 03/02/2015, 1:58 PM Pager: [phone]

## 2015-03-02 NOTE — Progress Notes (Signed)
ANTICOAGULATION CONSULT NOTE - Follow Up Consult  Pharmacy Consult for heparin Indication: chest pain/ACS  No Known Allergies  Patient Measurements: Height: 6' (182.9 cm) Weight: 188 lb 4.8 oz (85.412 kg) IBW/kg (Calculated) : 77.6 Heparin Dosing Weight: 85.3 kg  Vital Signs: Temp: 97.9 F (36.6 C) (09/12 0749) Temp Source: Oral (09/12 0749) BP: 139/67 mmHg (09/12 0749) Pulse Rate: 50 (09/12 0749)  Labs:  Recent Labs  03/01/15 1830 03/01/15 2309 03/02/15 0450 03/02/15 0500  HGB  --   --  14.2  --   HCT  --   --  42.8  --   PLT  --   --  256  --   LABPROT 13.5  --   --  13.8  INR 1.01  --   --  1.04  HEPARINUNFRC 0.33  --   --  0.34  CREATININE  --   --  1.02  --   TROPONINI 0.60* 0.46* 0.36*  --     Estimated Creatinine Clearance: 82.4 mL/min (by C-G formula based on Cr of 1.02).  Assessment: 64yoM admitted 03/01/2015 for chest pain and elevated troponin. Initially presented to [facility] with a sudden onset of chest pain. His troponins were found to be elevated with non specific changes on EKG. Heparin was started at [facility] 9/11 ~0800. Pt was then transferred to [facility].  PMH: CAD s/p CABG in 2006 and multiple DES stents, HTN, HLD, AAA s/p repair, PVD, tobacco abuse (1 ppd)  Current HL 0.34 (therapeutic), H/H stable, Plts wnl, no s/sx of bleeding  Goal of Therapy:  Heparin level 0.3-0.7 units/ml Monitor platelets by anticoagulation protocol: Yes   Plan:  - Continue heparin at 1350 units/hr - Monitor daily HL, CBC and s/sx of bleeding  Dimitri Ped, PharmD. Clinical Pharmacist Resident Pager: [phone]

## 2015-03-02 NOTE — Progress Notes (Signed)
   There was no ST segment deviation noted during stress.  T wave inversion of 1 mm was noted during stress in the I, II, aVF, V5 and V6 leads. T wave inversion persisted.  Defect 1: There is a medium defect of severe severity present in the apical anterior, apical inferior and apex location.  Findings consistent with prior myocardial infarction. There is no evidence of ischemia.  This is an intermediate risk study.  Nuclear stress EF: 39%.   No ischemia noted on myoview. Discussed result with the patient. Will transfer to telemetry. Change ranexa to 1000mg  BID. Monitor overnight for bradycardia and BP. Will reassess tomorrow AM regarding discharge.  Hilbert Corrigan PA Pager: [phone]

## 2015-03-02 NOTE — Progress Notes (Signed)
Subjective: No further chest pain   Objective: Vital signs in last 24 hours: Temp:  [97.9 F (36.6 C)-98.3 F (36.8 C)] 98.1 F (36.7 C) (09/12 0438) Pulse Rate:  [36-143] 50 (09/12 0400) Resp:  [13-19] 14 (09/12 0400) BP: (108-203)/(49-173) 109/49 mmHg (09/12 0400) SpO2:  [72 %-100 %] 94 % (09/12 0400) Weight:  [188 lb (85.276 kg)-188 lb 4.8 oz (85.412 kg)] 188 lb 4.8 oz (85.412 kg) (09/12 0438) Weight change:  Last BM Date: 03/01/15 Intake/Output from previous day: -1036 09/11 0701 - 09/12 0700 In: 138.4 [I.V.:138.4] Out: 1175 [Urine:1175] Intake/Output this shift:    PE: General:Pleasant affect, NAD Skin:Warm and dry, brisk capillary refill HEENT:normocephalic, sclera clear, mucus membranes moist Heart:S1S2 RRR without murmur, gallup, rub or click Lungs:clear without rales, rhonchi, or wheezes GLO:VFIE, non tender, + BS, do not palpate liver spleen or masses Ext:no lower ext edema, 2+ pedal pulses, 2+ radial pulses Neuro:alert and oriented X 3, MAE, follows commands, + facial symmetry Tele: SB to 49 at times  EKG SB with lat t wave inversions similar to 11/2013   Lab Results:  Recent Labs  03/02/15 0450  WBC 9.4  HGB 14.2  HCT 42.8  PLT 256   BMET  Recent Labs  03/02/15 0450  NA 136  K 4.2  CL 102  CO2 28  GLUCOSE 128*  BUN 16  CREATININE 1.02  CALCIUM 8.9    Recent Labs  03/01/15 2309 03/02/15 0450  TROPONINI 0.46* 0.36*    Lab Results  Component Value Date   CHOL 161 03/02/2015   HDL 33* 03/02/2015   LDLCALC 69 03/02/2015   TRIG 294* 03/02/2015   CHOLHDL 4.9 03/02/2015     Hepatic Function Panel  Recent Labs  03/02/15 0450  PROT 5.9*  ALBUMIN 3.3*  AST 18  ALT 9*  ALKPHOS 57  BILITOT 0.5    Recent Labs  03/02/15 0450  CHOL 161   No results for input(s): PROTIME in the last 72 hours.     Studies/Results: No results found.  Medications: I have reviewed the patient's current medications. Scheduled  Meds: . aspirin EC  81 mg Oral Daily  . aspirin EC  81 mg Oral Daily  . carvedilol  12.5 mg Oral BID WC  . fenofibrate  160 mg Oral Daily  . lisinopril  20 mg Oral Daily  . prasugrel  10 mg Oral Daily  . ranolazine  500 mg Oral BID  . simvastatin  40 mg Oral q1800  . sodium chloride  3 mL Intravenous Q12H   Continuous Infusions: . heparin 1,350 Units/hr (03/02/15 0702)  . nitroGLYCERIN 10 mcg/min (03/01/15 2110)   PRN Meds:.sodium chloride, acetaminophen, nitroGLYCERIN, ondansetron (ZOFRAN) IV, sodium chloride  Assessment/Plan: Clifford Jones is a 64 y.o. male with a history of CAD s/p CABG x6V (2006) and multiple stents, AAA s/p repair, HTN, HLD, tobacco abuse, and PVD who was transferred from [facility] today for chest pain and elevated troponin.     Principle Problem:   NSTEMI (non-ST elevated myocardial infarction) troponin: 0.01-->0.02-->0.14 --> 0.12 at [facility]  Here 0.60-->0.46-->0.36 ----- Will get 2D ECHO and plan for myoview unless his troponin continues to rise no more chest pain.  -- Will add simvastatin. Discontinue Tricor. (myalgias with atorva) Continue BB and ASA/EFfient  CAD with Hx CABG 2006, and CAD of graft he's undergone PCI with previous stenting of the saphenous vein graft obtuse marginal.   Last cath 10/2013  with PCI with stent of Rt PLA branch through the [place] graft.    HTN- BP elevated here at 187/79. Continue coreg 12.5mg  BID and will add back lisinopril 20mg  and consider adding amlodipine if BP doesn't improve.   Tobacco abuse- continues to smoke 1PPD. Counseled on cessation  he has stopped for up to a week at times.  Sinus Brady- to 49 at times will decrease coreg to 6.25 for now.  It has been held due to HR   LOS: 1 day   Time spent with pt. : 15 minutes. [facility] R  Nurse Practitioner Certified Pager [phone] or after 5pm and on weekends call [phone] 03/02/2015, 7:16 AM  '

## 2015-03-02 NOTE — Progress Notes (Signed)
NP made aware of HR in the 40s. Coreg was ordered to be given at 0800. NP chaged the order to a lower dose and stated she wanted the coreg given even with HR in 40s. Will continue to monitor the pt closely.

## 2015-03-03 DIAGNOSIS — I214 Non-ST elevation (NSTEMI) myocardial infarction: Principal | ICD-10-CM

## 2015-03-03 DIAGNOSIS — R079 Chest pain, unspecified: Secondary | ICD-10-CM

## 2015-03-03 LAB — CBC
HCT: 42.1 % (ref 39.0–52.0)
Hemoglobin: 14 g/dL (ref 13.0–17.0)
MCH: 30.8 pg (ref 26.0–34.0)
MCHC: 33.3 g/dL (ref 30.0–36.0)
MCV: 92.5 fL (ref 78.0–100.0)
PLATELETS: 245 10*3/uL (ref 150–400)
RBC: 4.55 MIL/uL (ref 4.22–5.81)
RDW: 12.8 % (ref 11.5–15.5)
WBC: 8.8 10*3/uL (ref 4.0–10.5)

## 2015-03-03 LAB — HEPARIN LEVEL (UNFRACTIONATED): HEPARIN UNFRACTIONATED: 0.3 [IU]/mL (ref 0.30–0.70)

## 2015-03-03 MED ORDER — ROSUVASTATIN CALCIUM 40 MG PO TABS: 40.0000 mg | ORAL_TABLET | Freq: Every day | ORAL | Status: DC

## 2015-03-03 MED ORDER — CARVEDILOL 6.25 MG PO TABS: 6.2500 mg | ORAL_TABLET | Freq: Two times a day (BID) | ORAL | Status: DC

## 2015-03-03 MED ORDER — AMLODIPINE BESYLATE 5 MG PO TABS: 5.0000 mg | ORAL_TABLET | Freq: Every day | ORAL | Status: DC

## 2015-03-03 MED ORDER — LISINOPRIL 20 MG PO TABS: 20.0000 mg | ORAL_TABLET | Freq: Every day | ORAL | Status: DC

## 2015-03-03 MED ORDER — ASPIRIN 81 MG PO TBEC: 81.0000 mg | DELAYED_RELEASE_TABLET | Freq: Every day | ORAL | Status: DC

## 2015-03-03 MED ORDER — PANTOPRAZOLE SODIUM 40 MG PO TBEC: 40.0000 mg | DELAYED_RELEASE_TABLET | Freq: Two times a day (BID) | ORAL | Status: DC

## 2015-03-03 MED ORDER — NITROGLYCERIN 0.4 MG SL SUBL: 0.4000 mg | SUBLINGUAL_TABLET | SUBLINGUAL | Status: AC | PRN

## 2015-03-03 MED ORDER — RANOLAZINE ER 1000 MG PO TB12: 1000.0000 mg | ORAL_TABLET | Freq: Two times a day (BID) | ORAL | Status: DC

## 2015-03-03 MED ORDER — ROSUVASTATIN CALCIUM 20 MG PO TABS: 40.0000 mg | ORAL_TABLET | Freq: Every day | ORAL | Status: DC

## 2015-03-03 MED ORDER — ACETAMINOPHEN 325 MG PO TABS: 650.0000 mg | ORAL_TABLET | ORAL | Status: DC | PRN

## 2015-03-03 NOTE — Discharge Summary (Signed)
Physician Discharge Summary       Patient ID: Clifford Jones MRN: [id number] DOB/AGE: [birth year] 64 y.o.  Admit date: 03/01/2015 Discharge date: 03/03/2015 Primary Cardiologist: Dr. Jacinta Shoe    Discharge Diagnoses:  Principal Problem:   NSTEMI (non-ST elevated myocardial infarction) Active Problems:   CAD, bypass graft   CAD (coronary artery disease)   Hyperlipidemia   Hypertension   Tobacco abuse   Elevated troponin   Chest pain   Discharged Condition: good  Procedures: none  Hospital Course:  64 y.o. male with a history of CAD s/p CABG x6V (2006) and multiple stents, AAA s/p repair, HTN, HLD, tobacco abuse, and PVD who was transferred from [facility] 03/01/15 for chest pain and elevated troponin.  He had been doing well without complaints until night before admit.  He developed sudden onset Lt sided chest pressure without associated symptoms. History was somewhat inconsistent.  He told the ER that he took 1 NTG with complete relief.  45 min later pain returned with repeat NTG and went to [facility].  He tells me that he took 1 SL NTG and pain resolved 45 minutes later after he belched several times.  At [facility] his troponin was 0.01-->0.02-->0.14 --> 0.12 and ECG with no acute ST or TW changes. The patient asked to be transferred to [facility] so he could see his cardiology group.   Pt's pain resolved and troponin pk here at 0.60 and decreased to 0.36.  Pt has stress test neg for ischemia, results below and Echo with normal EF.   He did have significant bradycardia and coreg was decreased to 6.25 mg BID.  Norvasc was added for better BP controlled.  He was initially started on simvastatin due to intolerance to lipitor due to myalgias but simvastatin was stopped due to interaction with ranexa and Crestor was added.  Fenofibrate was stopped.  It was unclear whether his CP was related to angina or GI given that it significantly improved with belching.  His Ranexa was increased  to 1000 BID and Protonix increased to BID- for 1 month then back to once daily.  He ambulated without problems, seen  and found stable for discharge, with follow up in 1-2 weeks.      PMH:  1999 Inf MI; b. 2003 CABGx6; c. 2008 NSTEMI, occluded SVG to marginal and diagonal system. 2 DES placed to marginal system; c. NSTEMI - 02/2012 Xience Xpedition DES to oS-OM and POBA to mid lesion; c. 10/2013 NSTEMI/PCI: VG->OM 80 ISR, native OM 80/90 small, LIMA->LAD ok w/ 90 dLAD, D1 80, RIMA->RPL ok w/ 95 in RPL (2.5x14 Resolute DES), nl EF.  Consults: None  Significant Diagnostic Studies:  BMP Latest Ref Rng 03/02/2015 10/24/2013 10/23/2013  Glucose 65 - 99 mg/dL 128(H) 100(H) 129(H)  BUN 6 - 20 mg/dL 16 21 17   Creatinine 0.61 - 1.24 mg/dL 1.02 1.16 0.93  Sodium 135 - 145 mmol/L 136 140 139  Potassium 3.5 - 5.1 mmol/L 4.2 4.9 4.2  Chloride 101 - 111 mmol/L 102 103 102  CO2 22 - 32 mmol/L 28 27 26   Calcium 8.9 - 10.3 mg/dL 8.9 8.8 8.8   CBC Latest Ref Rng 03/03/2015 03/02/2015 10/24/2013  WBC 4.0 - 10.5 K/uL 8.8 9.4 11.9(H)  Hemoglobin 13.0 - 17.0 g/dL 14.0 14.2 13.8  Hematocrit 39.0 - 52.0 % 42.1 42.8 42.1  Platelets 150 - 400 K/uL 245 256 217    troponin at [facility] 0.60 to 0.36 hgb A1C 6.0  Lipid Panel  Component Value Date/Time   CHOL 161 03/02/2015 0450   TRIG 294* 03/02/2015 0450   HDL 33* 03/02/2015 0450   CHOLHDL 4.9 03/02/2015 0450   VLDL 59* 03/02/2015 0450   LDLCALC 69 03/02/2015 0450   nuc stress test:  There was no ST segment deviation noted during stress.  T wave inversion of 1 mm was noted during stress in the I, II, aVF, V5 and V6 leads. T wave inversion persisted.  Defect 1: There is a medium defect of severe severity present in the apical anterior, apical inferior and apex location.  Findings consistent with prior myocardial infarction. There is no evidence of ischemia.  This is an intermediate risk study.  Nuclear stress EF: 39%.  ECHO: Study Conclusions  - Left  ventricle: The cavity size was normal. There was mild concentric hypertrophy. Systolic function was normal. The estimated ejection fraction was in the range of 55% to 60%. Possible hypokinesis of the basalinferior myocardium. Marked bradycardia limits evaluation of LV diastolic function. - Aortic valve: There was trivial regurgitation. - Left atrium: The atrium was moderately dilated. - Right ventricle: Systolic function was mildly reduced.  CXR at [facility] : No active disease cardiac silhouette is within normal limits, thoracic aorta is tortous, S shaped thoracolumber scoliosis is noted.   EKG: Sinus bradycardia at 51 Nonspecific T wave abnormality Abnormal ECG  Discharge Exam: Blood pressure 178/83, pulse 59, temperature 97.7 F (36.5 C), temperature source Oral, resp. rate 16, height 6' (1.829 m), weight 186 lb 4.6 oz (84.5 kg), SpO2 97 %.  Disposition: 01-Home or Self Care     Medication List    STOP taking these medications        fenofibrate 160 MG tablet      TAKE these medications        acetaminophen 325 MG tablet  Commonly known as:  TYLENOL  Take 2 tablets (650 mg total) by mouth every 4 (four) hours as needed for headache or mild pain.     amLODipine 5 MG tablet  Commonly known as:  NORVASC  Take 1 tablet (5 mg total) by mouth daily.     aspirin 81 MG EC tablet  Take 1 tablet (81 mg total) by mouth daily.     carvedilol 6.25 MG tablet  Commonly known as:  COREG  Take 1 tablet (6.25 mg total) by mouth 2 (two) times daily with a meal.     EFFIENT 10 MG Tabs tablet  Generic drug:  prasugrel  TAKE 1 TABLET BY MOUTH DAILY     lisinopril 20 MG tablet  Commonly known as:  PRINIVIL,ZESTRIL  Take 1 tablet (20 mg total) by mouth daily.     nitroGLYCERIN 0.4 MG SL tablet  Commonly known as:  NITROSTAT  Place 1 tablet (0.4 mg total) under the tongue every 5 (five) minutes x 3 doses as needed for chest pain.     pantoprazole 40 MG tablet  Commonly  known as:  PROTONIX  Take 1 tablet (40 mg total) by mouth 2 (two) times daily.     ranolazine 1000 MG SR tablet  Commonly known as:  RANEXA  Take 1 tablet (1,000 mg total) by mouth 2 (two) times daily.     rosuvastatin 40 MG tablet  Commonly known as:  CRESTOR  Take 1 tablet (40 mg total) by mouth daily at 6 PM.       Follow-up Information    Follow up with Herminio Commons, MD On 03/12/2015.  Specialty:  Cardiology   Why:  at 2:30pm with Lysle Dingwall NP   Contact information:   [place] [address] [phone]        Discharge Instructions: We placed you on Crestor for cholesterol management.    We increased you ranexa to 100 mg twice a day.  We decreased your coreg to 6.25 mg BID because your HR was too slow.    We increased your protonix in case there was a reflux component, after 1 month decrease to once daily.  Do not take fenofibrate.    Call if further problems.  Your health would be better off if you stop smoking.   Heart Healthy diet watching sweets and white breads, your sugar-glucose is a little high.  Follow up with your PCP      Signed: Isaiah Serge Nurse Practitioner-Certified [facility]: [facility] 03/03/2015, 11:07 AM  Time spent on discharge : > 30 minutes.    Agree with Discharge summary as outlined above by Cecilie Kicks, NP with minor changes.

## 2015-03-03 NOTE — Discharge Instructions (Signed)
We placed you on Crestor for cholesterol management.    We increased you ranexa to 100 mg twice a day.  We decreased your coreg to 6.25 mg BID because your HR was too slow.    We increased your protonix in case there was a reflux component, after 1 month decrease to once daily.  Do not take fenofibrate.    Call if further problems.  Your health would be better off if you stop smoking.   Heart Healthy diet watching sweets and white breads, your sugar-glucose is a little high.  Follow up with your PCP

## 2015-03-03 NOTE — Progress Notes (Signed)
ANTICOAGULATION CONSULT NOTE - Follow Up Consult  Pharmacy Consult for heparin Indication: chest pain/ACS  No Known Allergies  Patient Measurements: Height: 6' (182.9 cm) Weight: 186 lb 4.6 oz (84.5 kg) IBW/kg (Calculated) : 77.6 Heparin Dosing Weight: 85.3 kg  Vital Signs: Temp: 97.7 F (36.5 C) (09/13 0754) Temp Source: Oral (09/13 0754) BP: 178/83 mmHg (09/13 0754)  Labs:  Recent Labs  03/01/15 1830 03/01/15 2309 03/02/15 0450 03/02/15 0500 03/03/15 0227  HGB  --   --  14.2  --  14.0  HCT  --   --  42.8  --  42.1  PLT  --   --  256  --  245  LABPROT 13.5  --   --  13.8  --   INR 1.01  --   --  1.04  --   HEPARINUNFRC 0.33  --   --  0.34 0.30  CREATININE  --   --  1.02  --   --   TROPONINI 0.60* 0.46* 0.36*  --   --     Estimated Creatinine Clearance: 82.4 mL/min (by C-G formula based on Cr of 1.02).  Assessment: 64yoM admitted 03/01/2015 for chest pain and elevated troponin. Initially presented to [facility] with a sudden onset of chest pain. His troponins were found to be elevated with non specific changes on EKG. Heparin was started at [facility] 9/11 ~0800. Pt was then transferred to [facility].  PMH: CAD s/p CABG in 2006 and multiple DES stents, HTN, HLD, AAA s/p repair, PVD, tobacco abuse (1 ppd)  Current HL 0.30 (therapeutic), H/H stable, Plts wnl, no s/sx of bleeding  Goal of Therapy:  Heparin level 0.3-0.7 units/ml Monitor platelets by anticoagulation protocol: Yes   Plan:  - Increase heparin to 1400 units/hr - Monitor daily HL, CBC and s/sx of bleeding  Dimitri Ped, PharmD. Clinical Pharmacist Resident Pager: [phone]

## 2015-03-03 NOTE — Progress Notes (Signed)
Subjective: No chest pain, no SOB  Objective: Vital signs in last 24 hours: Temp:  [97.4 F (36.3 C)-97.8 F (36.6 C)] 97.7 F (36.5 C) (09/13 0754) Pulse Rate:  [44-71] 59 (09/13 0754) Resp:  [15-21] 16 (09/13 0754) BP: (129-178)/(51-83) 178/83 mmHg (09/13 0754) SpO2:  [92 %-97 %] 97 % (09/13 0754) Weight:  [186 lb 4.6 oz (84.5 kg)] 186 lb 4.6 oz (84.5 kg) (09/13 0423) Weight change: -1 lb 11.4 oz (-0.776 kg) Last BM Date: 03/01/15 Intake/Output from previous day: -1036 09/12 0701 - 09/13 0700 In: 764 [P.O.:440; I.V.:324] Out: 1325 [Urine:1325] Intake/Output this shift: Total I/O In: 228 [P.O.:200; I.V.:28] Out: 400 [Urine:400]  PE: General:Pleasant affect, NAD Skin:Warm and dry, brisk capillary refill HEENT:normocephalic, sclera clear, mucus membranes moist Neck:supple, no JVD Heart:S1S2 RRR without murmur, gallup, rub or click Lungs:clear without rales, rhonchi, or wheezes YQM:VHQI, non tender, + BS, do not palpate liver spleen or masses Ext:no lower ext edema, 2+ pedal pulses, 2+ radial pulses Neuro:alert and oriented X 3, MAE, follows commands, + facial symmetry Tele: SB upper 40s to 50s occ in 60s improved with decrease in BB  Lab Results:  Recent Labs  03/02/15 0450 03/03/15 0227  WBC 9.4 8.8  HGB 14.2 14.0  HCT 42.8 42.1  PLT 256 245   BMET  Recent Labs  03/02/15 0450  NA 136  K 4.2  CL 102  CO2 28  GLUCOSE 128*  BUN 16  CREATININE 1.02  CALCIUM 8.9    Recent Labs  03/01/15 2309 03/02/15 0450  TROPONINI 0.46* 0.36*    Lab Results  Component Value Date   CHOL 161 03/02/2015   HDL 33* 03/02/2015   LDLCALC 69 03/02/2015   TRIG 294* 03/02/2015   CHOLHDL 4.9 03/02/2015   Lab Results  Component Value Date   HGBA1C 6.0* 03/01/2015       Hepatic Function Panel  Recent Labs  03/02/15 0450  PROT 5.9*  ALBUMIN 3.3*  AST 18  ALT 9*  ALKPHOS 57  BILITOT 0.5    Recent Labs  03/02/15 0450  CHOL 161   No  results for input(s): PROTIME in the last 72 hours.     Studies/Results: Nm Myocar Multi W/spect W/wall Motion / Ef  03/02/2015    There was no ST segment deviation noted during stress.  T wave inversion of 1 mm was noted during stress in the I, II, aVF, V5  and V6 leads. T wave inversion persisted.  Defect 1: There is a medium defect of severe severity present in the  apical anterior, apical inferior and apex location.  Findings consistent with prior myocardial infarction. There is no  evidence of ischemia.  This is an intermediate risk study.  Nuclear stress EF: 39%.    ECHO: Study Conclusions  - Left ventricle: The cavity size was normal. There was mild concentric hypertrophy. Systolic function was normal. The estimated ejection fraction was in the range of 55% to 60%. Possible hypokinesis of the basalinferior myocardium. Marked bradycardia limits evaluation of LV diastolic function. - Aortic valve: There was trivial regurgitation. - Left atrium: The atrium was moderately dilated. - Right ventricle: Systolic function was mildly reduced.  Medications: I have reviewed the patient's current medications. Scheduled Meds: . aspirin EC  81 mg Oral Daily  . carvedilol  6.25 mg Oral BID WC  . lisinopril  20 mg Oral Daily  . prasugrel  10 mg Oral Daily  . ranolazine  1,000 mg Oral BID  . simvastatin  40 mg Oral q1800  . sodium chloride  3 mL Intravenous Q12H   Continuous Infusions: . heparin 1,400 Units/hr (03/03/15 0758)   PRN Meds:.sodium chloride, acetaminophen, nitroGLYCERIN, ondansetron (ZOFRAN) IV, sodium chloride  Assessment/Plan: Principal Problem:   NSTEMI (non-ST elevated myocardial infarction)- negative nuc study, echo with EF 55-60% --ranexa increased to 1000 mg bid , continue bb, ace and ranexa, on statin asa and effient. Stop heparin and ambulate, prob d/c today.   Active Problems:   Sinus Bradycardia to 40s with decrease of BB now upper 40s to 50s.     CAD, bypass graft   CAD (coronary artery disease)   Hyperlipidemia   Hypertension   Tobacco abuse- instructed to stop smoking.    Elevated troponin    LOS: 2 days   Time spent with pt. :15 minutes. [facility] R  Nurse Practitioner Certified Pager [phone] or after 5pm and on weekends call [phone] 03/03/2015, 10:05 AM

## 2015-03-03 NOTE — Progress Notes (Signed)
Discharged home by wheelchair accompanied by daughter, discharge instructions given to pt.. Belongings with pt.

## 2015-03-11 NOTE — Progress Notes (Signed)
Cardiology Office Note   Date:  03/12/2015   ID:  Sheridan, Gettel 01/04/1951, MRN [id number]  PCP:  Rory Percy, MD  Cardiologist: Woodroe Chen, NP   Chief Complaint  Patient presents with  . Coronary Artery Disease  . Hypertension      History of Present Illness: Clifford Jones is a 64 y.o. male who presents for history of CAD s/p CABG x6V (2006) and multiple stents, AAA s/p repair, HTN, HLD, tobacco abuse, and PVD who was transferred from [facility] 03/01/15 for chest pain and elevated troponin. He had been doing well without complaints until night before admit.     He had a stress test that was negative for ischemia. It was unclear whether his CP was related to angina or GI given that it significantly improved with belching. His Ranexa was increased to 1000 BID and Protonix increased to BID- for 1 month then back to once daily.He is here for hospital follow up.    He today with questions about his hospitalization and medications. He, states he has had a little bit of a virus since returning home with a little bit of cough, but nothing serious and he is beginning to feel better. He denies any recurrent chest pain. He still feels a little uneasy on her stomach after eating greasy foods. He does still have his gallbladder.     Past Medical History  Diagnosis Date  . Hypertension   . Hyperlipidemia   . Coronary artery disease     a. 1999 Inf MI;  b. 2003 CABGx6;  c. 2008 NSTEMI, occluded SVG to marginal and diagonal system. 2 DES placed to marginal system;  c. NSTEMI - 02/2012 Xience Xpedition DES to oS-OM and POBA to mid lesion;  c. 10/2013 NSTEMI/PCI: VG->OM 80 ISR, native OM 80/90 small, LIMA->LAD ok w/ 90 dLAD, D1 80, RIMA->RPL ok w/ 95 in RPL (2.5x14 Resolute DES), nl EF.  Marland Kitchen GERD (gastroesophageal reflux disease)   . S/P AAA repair   . Tobacco abuse     Past Surgical History  Procedure Laterality Date  . Abdominal aortic aneurysm repair  2006  .  Coronary artery bypass graft  2003  . Hernia repair    . Coronary angioplasty with stent placement    . Left heart catheterization with coronary angiogram N/A 03/05/2012    Procedure: LEFT HEART CATHETERIZATION WITH CORONARY ANGIOGRAM;  Surgeon: Hillary Bow, MD;  Location: [facility] CATH LAB;  Service: Cardiovascular;  Laterality: N/A;  . Percutaneous coronary stent intervention (pci-s) N/A 03/07/2012    Procedure: PERCUTANEOUS CORONARY STENT INTERVENTION (PCI-S);  Surgeon: Sherren Mocha, MD;  Location: [facility] CATH LAB;  Service: Cardiovascular;  Laterality: N/A;  . Left heart catheterization with coronary/graft angiogram  10/22/2013    Procedure: LEFT HEART CATHETERIZATION WITH Beatrix Fetters;  Surgeon: Leonie Man, MD;  Location: [facility] CATH LAB;  Service: Cardiovascular;;  . Left heart catheterization with coronary angiogram N/A 10/23/2013    Procedure: LEFT HEART CATHETERIZATION WITH CORONARY ANGIOGRAM;  Surgeon: Blane Ohara, MD;  Location: [facility] CATH LAB;  Service: Cardiovascular;  Laterality: N/A;  . Percutaneous coronary stent intervention (pci-s) Right 10/23/2013    Procedure: PERCUTANEOUS CORONARY STENT INTERVENTION (PCI-S);  Surgeon: Blane Ohara, MD;  Location: [facility] CATH LAB;  Service: Cardiovascular;  Laterality: Right;     Current Outpatient Prescriptions  Medication Sig Dispense Refill  . acetaminophen (TYLENOL) 325 MG tablet Take 2 tablets (650 mg total) by mouth every 4 (four)  hours as needed for headache or mild pain.    Marland Kitchen amLODipine (NORVASC) 5 MG tablet Take 1 tablet (5 mg total) by mouth daily. 30 tablet 6  . aspirin EC 81 MG EC tablet Take 1 tablet (81 mg total) by mouth daily. 30 tablet 11  . carvedilol (COREG) 6.25 MG tablet Take 1 tablet (6.25 mg total) by mouth 2 (two) times daily with a meal. 60 tablet 6  . lisinopril (PRINIVIL,ZESTRIL) 20 MG tablet Take 1 tablet (20 mg total) by mouth daily. 30 tablet 6  . nitroGLYCERIN (NITROSTAT) 0.4 MG SL tablet Place 1 tablet  (0.4 mg total) under the tongue every 5 (five) minutes x 3 doses as needed for chest pain. 25 tablet 4  . pantoprazole (PROTONIX) 40 MG tablet Take 1 tablet (40 mg total) by mouth 2 (two) times daily. 60 tablet 2  . ranolazine (RANEXA) 1000 MG SR tablet Take 1 tablet (1,000 mg total) by mouth 2 (two) times daily. 60 tablet 6  . rosuvastatin (CRESTOR) 40 MG tablet Take 1 tablet (40 mg total) by mouth daily at 6 PM. 30 tablet 6   No current facility-administered medications for this visit.    Allergies:   Review of patient's allergies indicates no known allergies.    Social History:  The patient  reports that he has been smoking Cigarettes.  He started smoking about 50 years ago. He has a 45 pack-year smoking history. He has never used smokeless tobacco. He reports that he drinks about 1.8 oz of alcohol per week. He reports that he does not use illicit drugs.   Family History:  The patient's family history includes Coronary artery disease in an other family member.    ROS: All other systems are reviewed and negative. Unless otherwise mentioned in H&P    PHYSICAL EXAM: VS:  BP 160/100 mmHg  Pulse 60  Ht 6' (1.829 m)  Wt 191 lb (86.637 kg)  BMI 25.90 kg/m2  SpO2 96% , BMI Body mass index is 25.9 kg/(m^2). GEN: Well nourished, well developed, in no acute distress HEENT: normal Neck: no JVD, carotid bruits, or masses Cardiac: RRR; no murmurs, rubs, or gallops,no edema  Respiratory:  clear to auscultation bilaterally, normal work of breathing GI: soft, nontender, nondistended, + BS MS: no deformity or atrophy Skin: warm and dry, no rash Neuro:  Strength and sensation are intact Psych: euthymic mood, full affect   Recent Labs: 03/02/2015: ALT 9*; BUN 16; Creatinine, Ser 1.02; Potassium 4.2; Sodium 136 03/03/2015: Hemoglobin 14.0; Platelets 245    Lipid Panel    Component Value Date/Time   CHOL 161 03/02/2015 0450   TRIG 294* 03/02/2015 0450   HDL 33* 03/02/2015 0450   CHOLHDL  4.9 03/02/2015 0450   VLDL 59* 03/02/2015 0450   LDLCALC 69 03/02/2015 0450      Wt Readings from Last 3 Encounters:  03/12/15 191 lb (86.637 kg)  03/03/15 186 lb 4.6 oz (84.5 kg)  10/31/13 195 lb (88.451 kg)     ASSESSMENT AND PLAN:  1.  CAD: he is here after recent admission for chest pain. Patient has history of coronary artery bypass grafting, with drug-eluting stents to the ostial obtuse marginal and diagonal system. He also had PCI as a vein graft to the OM.he was ruled out for cardiac etiology. It was felt was related to GI. He is yet to follow up with primary care and a GI specialist.he is tolerating the increased dose of Ranexa. He has not had  to take any nitroglycerin sublingual.the patient will continue current medication regimen. I have suggested that he see his primary care physician for possible gallbladder ultrasound in the setting of recurrent chest pain, which occurs after eating. Patient may also benefit from a GI evaluation.  2. Ongoing tobacco abuse:the patient is counseled, less than 5 minutes on smoking cessation.  3. Hypertension:I have rechecked his blood pressure in the clinic room after speaking with him for a while. His blood pressure came down to 130/68. I believe his elevated blood pressure when he came in is related whitecoat syndrome, and he did settle down. He is compliant.  Current medicines are reviewed at length with the patient today.    Labs/ tests ordered today include: 9No orders of the defined types were placed in this encounter.     Disposition:   FU with 6 months   Signed, Jory Sims, NP  03/12/2015 4:27 PM    [facility] [address] [place], [place] [address] Phone: [phone]; Fax: [phone]

## 2015-03-12 ENCOUNTER — Encounter: Payer: Self-pay | Admitting: Adult Health

## 2015-03-12 ENCOUNTER — Ambulatory Visit (INDEPENDENT_AMBULATORY_CARE_PROVIDER_SITE_OTHER): Payer: Commercial Managed Care - HMO | Admitting: Adult Health

## 2015-03-12 VITALS — BP 160/100 | HR 60 | Ht 72.0 in | Wt 191.0 lb

## 2015-03-12 DIAGNOSIS — R0789 Other chest pain: Secondary | ICD-10-CM | POA: Diagnosis not present

## 2015-03-12 DIAGNOSIS — I10 Essential (primary) hypertension: Secondary | ICD-10-CM | POA: Diagnosis not present

## 2015-03-12 DIAGNOSIS — I2510 Atherosclerotic heart disease of native coronary artery without angina pectoris: Secondary | ICD-10-CM

## 2015-03-12 NOTE — Progress Notes (Deleted)
Name: Clifford Jones    DOB: [birth year]  Age: 64 y.o.  MR#: [id number]       PCP:  Rory Percy, MD      Insurance: Payor: [email or web address] MEDICARE / Plan: [place] THN/NTSP / Product Type: *No Product type* /   CC:   No chief complaint on file.   VS Filed Vitals:   03/12/15 1434  BP: 160/100  Pulse: 60  Height: 6' (1.829 m)  Weight: 191 lb (86.637 kg)  SpO2: 96%    Weights Current Weight  03/12/15 191 lb (86.637 kg)  03/03/15 186 lb 4.6 oz (84.5 kg)  10/31/13 195 lb (88.451 kg)    Blood Pressure  BP Readings from Last 3 Encounters:  03/12/15 160/100  03/03/15 178/83  10/31/13 145/81     Admit date:  (Not on file) Last encounter with RMR:  Visit date not found   Allergy Review of patient's allergies indicates no known allergies.  Current Outpatient Prescriptions  Medication Sig Dispense Refill  . acetaminophen (TYLENOL) 325 MG tablet Take 2 tablets (650 mg total) by mouth every 4 (four) hours as needed for headache or mild pain.    Marland Kitchen amLODipine (NORVASC) 5 MG tablet Take 1 tablet (5 mg total) by mouth daily. 30 tablet 6  . aspirin EC 81 MG EC tablet Take 1 tablet (81 mg total) by mouth daily. 30 tablet 11  . carvedilol (COREG) 6.25 MG tablet Take 1 tablet (6.25 mg total) by mouth 2 (two) times daily with a meal. 60 tablet 6  . lisinopril (PRINIVIL,ZESTRIL) 20 MG tablet Take 1 tablet (20 mg total) by mouth daily. 30 tablet 6  . nitroGLYCERIN (NITROSTAT) 0.4 MG SL tablet Place 1 tablet (0.4 mg total) under the tongue every 5 (five) minutes x 3 doses as needed for chest pain. 25 tablet 4  . pantoprazole (PROTONIX) 40 MG tablet Take 1 tablet (40 mg total) by mouth 2 (two) times daily. 60 tablet 2  . ranolazine (RANEXA) 1000 MG SR tablet Take 1 tablet (1,000 mg total) by mouth 2 (two) times daily. 60 tablet 6  . rosuvastatin (CRESTOR) 40 MG tablet Take 1 tablet (40 mg total) by mouth daily at 6 PM. 30 tablet 6   No current facility-administered medications for this visit.     Discontinued Meds:    Medications Discontinued During This Encounter  Medication Reason  . EFFIENT 10 MG TABS tablet Error    Patient Active Problem List   Diagnosis Date Noted  . Chest pain   . Elevated troponin 03/02/2015  . NSTEMI (non-ST elevated myocardial infarction) 03/08/2012  . Unstable angina 03/04/2012  . CAD (coronary artery disease)   . Hypertension   . Personal history of noncompliance with medical treatment   . S/P AAA repair   . Peripheral vascular disease   . Tobacco abuse   . Hyperlipidemia   . CAD, bypass graft     LABS    Component Value Date/Time   NA 136 03/02/2015 0450   NA 140 10/24/2013 0405   NA 139 10/23/2013 0635   K 4.2 03/02/2015 0450   K 4.9 10/24/2013 0405   K 4.2 10/23/2013 0635   CL 102 03/02/2015 0450   CL 103 10/24/2013 0405   CL 102 10/23/2013 0635   CO2 28 03/02/2015 0450   CO2 27 10/24/2013 0405   CO2 26 10/23/2013 0635   GLUCOSE 128* 03/02/2015 0450   GLUCOSE 100* 10/24/2013 0405   GLUCOSE 129*  10/23/2013 0635   BUN 16 03/02/2015 0450   BUN 21 10/24/2013 0405   BUN 17 10/23/2013 0635   CREATININE 1.02 03/02/2015 0450   CREATININE 1.16 10/24/2013 0405   CREATININE 0.93 10/23/2013 0635   CALCIUM 8.9 03/02/2015 0450   CALCIUM 8.8 10/24/2013 0405   CALCIUM 8.8 10/23/2013 0635   GFRNONAA >60 03/02/2015 0450   GFRNONAA 66* 10/24/2013 0405   GFRNONAA 89* 10/23/2013 0635   GFRAA >60 03/02/2015 0450   GFRAA 77* 10/24/2013 0405   GFRAA >90 10/23/2013 0635   CMP     Component Value Date/Time   NA 136 03/02/2015 0450   K 4.2 03/02/2015 0450   CL 102 03/02/2015 0450   CO2 28 03/02/2015 0450   GLUCOSE 128* 03/02/2015 0450   BUN 16 03/02/2015 0450   CREATININE 1.02 03/02/2015 0450   CALCIUM 8.9 03/02/2015 0450   PROT 5.9* 03/02/2015 0450   ALBUMIN 3.3* 03/02/2015 0450   AST 18 03/02/2015 0450   ALT 9* 03/02/2015 0450   ALKPHOS 57 03/02/2015 0450   BILITOT 0.5 03/02/2015 0450   GFRNONAA >60 03/02/2015 0450    GFRAA >60 03/02/2015 0450       Component Value Date/Time   WBC 8.8 03/03/2015 0227   WBC 9.4 03/02/2015 0450   WBC 11.9* 10/24/2013 0405   HGB 14.0 03/03/2015 0227   HGB 14.2 03/02/2015 0450   HGB 13.8 10/24/2013 0405   HCT 42.1 03/03/2015 0227   HCT 42.8 03/02/2015 0450   HCT 42.1 10/24/2013 0405   MCV 92.5 03/03/2015 0227   MCV 92.8 03/02/2015 0450   MCV 91.9 10/24/2013 0405    Lipid Panel     Component Value Date/Time   CHOL 161 03/02/2015 0450   TRIG 294* 03/02/2015 0450   HDL 33* 03/02/2015 0450   CHOLHDL 4.9 03/02/2015 0450   VLDL 59* 03/02/2015 0450   LDLCALC 69 03/02/2015 0450    ABG No results found for: PHART, PCO2ART, PO2ART, HCO3, TCO2, ACIDBASEDEF, O2SAT   Lab Results  Component Value Date   TSH 0.779 03/04/2012   BNP (last 3 results) No results for input(s): BNP in the last 8760 hours.  ProBNP (last 3 results) No results for input(s): PROBNP in the last 8760 hours.  Cardiac Panel (last 3 results) No results for input(s): CKTOTAL, CKMB, TROPONINI, RELINDX in the last 72 hours.  Iron/TIBC/Ferritin/ %Sat No results found for: IRON, TIBC, FERRITIN, IRONPCTSAT   EKG Orders placed or performed during the hospital encounter of 03/01/15  . EKG 12-Lead  . EKG 12-Lead  . EKG 12-Lead     Prior Assessment and Plan Problem List as of 03/12/2015      Cardiovascular and Mediastinum   CAD, bypass graft   Last Assessment & Plan 04/30/2012 Office Visit Written 04/30/2012  9:31 AM by Hillary Bow, MD    He's not  having chest pain, and is improved since he had intervention.  He will remain on dual antiplatelet therapy, and I've cautioned him against the risks of bleeding.      CAD (coronary artery disease)   Peripheral vascular disease   Hypertension   Unstable angina   NSTEMI (non-ST elevated myocardial infarction)     Other   Hyperlipidemia   Last Assessment & Plan 04/30/2012 Office Visit Written 04/30/2012  9:33 AM by Hillary Bow, MD     We will recheck a lipid liver today. Adjustments will be made appropriately      Personal history of noncompliance  with medical treatment   S/P AAA repair   Tobacco abuse   Last Assessment & Plan 04/30/2012 Office Visit Written 04/30/2012  9:33 AM by Hillary Bow, MD    Discussed the need to quit today.      Elevated troponin   Chest pain       Imaging: Nm Myocar Multi W/spect W/wall Motion / Ef  03/02/2015    There was no ST segment deviation noted during stress.  T wave inversion of 1 mm was noted during stress in the I, II, aVF, V5  and V6 leads. T wave inversion persisted.  Defect 1: There is a medium defect of severe severity present in the  apical anterior, apical inferior and apex location.  Findings consistent with prior myocardial infarction. There is no  evidence of ischemia.  This is an intermediate risk study.  Nuclear stress EF: 39%.

## 2015-03-12 NOTE — Patient Instructions (Signed)
Your physician wants you to follow-up in: 6 Months. You will receive a reminder letter in the mail two months in advance. If you don't receive a letter, please call our office to schedule the follow-up appointment.  Your physician recommends that you continue on your current medications as directed. Please refer to the Current Medication list given to you today.  Thank you for choosing [facility]!   

## 2015-05-22 ENCOUNTER — Other Ambulatory Visit: Payer: Self-pay | Admitting: Cardiology

## 2015-08-18 DIAGNOSIS — Z7982 Long term (current) use of aspirin: Secondary | ICD-10-CM | POA: Diagnosis not present

## 2015-08-18 DIAGNOSIS — J441 Chronic obstructive pulmonary disease with (acute) exacerbation: Secondary | ICD-10-CM | POA: Diagnosis not present

## 2015-08-18 DIAGNOSIS — F17200 Nicotine dependence, unspecified, uncomplicated: Secondary | ICD-10-CM | POA: Diagnosis not present

## 2015-08-18 DIAGNOSIS — I10 Essential (primary) hypertension: Secondary | ICD-10-CM | POA: Diagnosis not present

## 2015-08-18 DIAGNOSIS — K219 Gastro-esophageal reflux disease without esophagitis: Secondary | ICD-10-CM | POA: Diagnosis not present

## 2015-08-18 DIAGNOSIS — J209 Acute bronchitis, unspecified: Secondary | ICD-10-CM | POA: Diagnosis not present

## 2015-08-18 DIAGNOSIS — J9601 Acute respiratory failure with hypoxia: Secondary | ICD-10-CM | POA: Diagnosis not present

## 2015-08-18 DIAGNOSIS — R0902 Hypoxemia: Secondary | ICD-10-CM | POA: Diagnosis not present

## 2015-08-18 DIAGNOSIS — Z8249 Family history of ischemic heart disease and other diseases of the circulatory system: Secondary | ICD-10-CM | POA: Diagnosis not present

## 2015-08-18 DIAGNOSIS — J45901 Unspecified asthma with (acute) exacerbation: Secondary | ICD-10-CM | POA: Diagnosis not present

## 2015-08-18 DIAGNOSIS — R0602 Shortness of breath: Secondary | ICD-10-CM | POA: Diagnosis not present

## 2015-08-18 DIAGNOSIS — Z23 Encounter for immunization: Secondary | ICD-10-CM | POA: Diagnosis not present

## 2015-08-18 DIAGNOSIS — I2510 Atherosclerotic heart disease of native coronary artery without angina pectoris: Secondary | ICD-10-CM | POA: Diagnosis not present

## 2015-08-20 DIAGNOSIS — Z23 Encounter for immunization: Secondary | ICD-10-CM | POA: Diagnosis not present

## 2015-09-11 ENCOUNTER — Other Ambulatory Visit: Payer: Self-pay | Admitting: Cardiology

## 2015-09-11 NOTE — Telephone Encounter (Signed)
Rx request sent to pharmacy.  

## 2015-10-12 ENCOUNTER — Other Ambulatory Visit: Payer: Self-pay | Admitting: Cardiology

## 2015-10-12 ENCOUNTER — Other Ambulatory Visit: Payer: Self-pay | Admitting: Adult Health

## 2015-11-11 ENCOUNTER — Encounter: Payer: Self-pay | Admitting: Cardiovascular Disease

## 2015-11-11 ENCOUNTER — Ambulatory Visit (INDEPENDENT_AMBULATORY_CARE_PROVIDER_SITE_OTHER): Payer: Commercial Managed Care - HMO | Admitting: Cardiovascular Disease

## 2015-11-11 VITALS — BP 176/88 | HR 63 | Ht 72.0 in | Wt 216.0 lb

## 2015-11-11 DIAGNOSIS — I10 Essential (primary) hypertension: Secondary | ICD-10-CM

## 2015-11-11 DIAGNOSIS — E785 Hyperlipidemia, unspecified: Secondary | ICD-10-CM

## 2015-11-11 DIAGNOSIS — I25768 Atherosclerosis of bypass graft of coronary artery of transplanted heart with other forms of angina pectoris: Secondary | ICD-10-CM

## 2015-11-11 MED ORDER — AMLODIPINE BESYLATE 10 MG PO TABS: 10.0000 mg | ORAL_TABLET | Freq: Every day | ORAL | Status: DC

## 2015-11-11 NOTE — Patient Instructions (Signed)
Medication Instructions:  INCREASE AMLODIPINE TO 10 MG DAILY   Labwork: NONE  Testing/Procedures: NONE  Follow-Up: Your physician wants you to follow-up in: 6 MONTHS .  You will receive a reminder letter in the mail two months in advance. If you don't receive a letter, please call our office to schedule the follow-up appointment.   Any Other Special Instructions Will Be Listed Below (If Applicable).     If you need a refill on your cardiac medications before your next appointment, please call your pharmacy.

## 2015-11-11 NOTE — Addendum Note (Signed)
Addended by: Debbora Lacrosse R on: 11/11/2015 11:43 AM   Modules accepted: Orders

## 2015-11-11 NOTE — Progress Notes (Signed)
Patient ID: ARTEMAS LETTMAN, male   DOB: 1951/05/04, 65 y.o.   MRN: [id number]      SUBJECTIVE: The patient presents for routine followup. He has a h/o non-STEMI and PCI with DES to the native RPLA via the [place] graft. He has residual VG->OM, native OM, distal LAD, and diagonal disease, which is being medically managed.  He also has a history of hypertension, tobacco abuse, hyperlipidemia, and peripheral vascular disease and is status post AAA repair. Initial CABG was in 2003.  Again hospitalized for non-STEMI in September 2016. Nuclear myocardial perfusion study demonstrated significant myocardial scar with no evidence of ischemia.  Echocardiogram 03/02/15 showed normal left ventricular systolic function with regional wall motion abnormalities, LVEF 55-60%, moderate left atrial dilatation, and mildly reduced right ventricular systolic function.  He said he is doing well and denies exertional chest pain, shortness of breath, and palpitations. He has been bothered by seasonal allergies recently.   Review of Systems: As per "subjective", otherwise negative.  No Known Allergies  Current Outpatient Prescriptions  Medication Sig Dispense Refill  . amLODipine (NORVASC) 5 MG tablet TAKE 1 TABLET BY MOUTH EVERY DAY 30 tablet 0  . aspirin EC 81 MG EC tablet Take 1 tablet (81 mg total) by mouth daily. 30 tablet 11  . carvedilol (COREG) 6.25 MG tablet TAKE 1 TABLET BY MOUTH TWICE DAILY WITH A MEAL. 60 tablet 0  . lisinopril (PRINIVIL,ZESTRIL) 20 MG tablet TAKE 1 TABLET BY MOUTH EVERY DAY 30 tablet 0  . nitroGLYCERIN (NITROSTAT) 0.4 MG SL tablet Place 1 tablet (0.4 mg total) under the tongue every 5 (five) minutes x 3 doses as needed for chest pain. 25 tablet 4  . pantoprazole (PROTONIX) 40 MG tablet TAKE 1 TABLET BY MOUTH TWICE DAILY 60 tablet 6  . RANEXA 1000 MG SR tablet TAKE 1 TABLET BY MOUTH TWICE DAILY 60 tablet 0  . rosuvastatin (CRESTOR) 40 MG tablet TAKE 1 TABLET BY MOUTH DAILY AT 6 PM. 30  tablet 0  . acetaminophen (TYLENOL) 325 MG tablet Take 2 tablets (650 mg total) by mouth every 4 (four) hours as needed for headache or mild pain.     No current facility-administered medications for this visit.    Past Medical History  Diagnosis Date  . Hypertension   . Hyperlipidemia   . Coronary artery disease     a. 1999 Inf MI;  b. 2003 CABGx6;  c. 2008 NSTEMI, occluded SVG to marginal and diagonal system. 2 DES placed to marginal system;  c. NSTEMI - 02/2012 Xience Xpedition DES to oS-OM and POBA to mid lesion;  c. 10/2013 NSTEMI/PCI: VG->OM 80 ISR, native OM 80/90 small, LIMA->LAD ok w/ 90 dLAD, D1 80, RIMA->RPL ok w/ 95 in RPL (2.5x14 Resolute DES), nl EF.  Marland Kitchen GERD (gastroesophageal reflux disease)   . S/P AAA repair   . Tobacco abuse     Past Surgical History  Procedure Laterality Date  . Abdominal aortic aneurysm repair  2006  . Coronary artery bypass graft  2003  . Hernia repair    . Coronary angioplasty with stent placement    . Left heart catheterization with coronary angiogram N/A 03/05/2012    Procedure: LEFT HEART CATHETERIZATION WITH CORONARY ANGIOGRAM;  Surgeon: Hillary Bow, MD;  Location[facility] C[facility] LAB;  Service: Cardiovascular;  Laterality: N/A;  . Percutaneous coronary stent intervention (pci-s) N/A 03/07/2012    Procedure: PERCUTANEOUS CORONARY STENT INTERVENTION (PCI-S);  Surgeon: Sherren Mocha, MD;  Location: [facility] CATH LAB;  Service: Cardiovascular;  Laterality: N/A;  . Left heart catheterization with coronary/graft angiogram  10/22/2013    Procedure: LEFT HEART CATHETERIZATION WITH Beatrix Fetters;  Surgeon: Leonie Man, MD;  Location: [facility] CATH LAB;  Service: Cardiovascular;;  . Left heart catheterization with coronary angiogram N/A 10/23/2013    Procedure: LEFT HEART CATHETERIZATION WITH CORONARY ANGIOGRAM;  Surgeon: Blane Ohara, MD;  Location: [facility] CATH LAB;  Service: Cardiovascular;  Laterality: N/A;  . Percutaneous coronary stent intervention  (pci-s) Right 10/23/2013    Procedure: PERCUTANEOUS CORONARY STENT INTERVENTION (PCI-S);  Surgeon: Blane Ohara, MD;  Location: [facility] CATH LAB;  Service: Cardiovascular;  Laterality: Right;    Social History   Social History  . Marital Status: Married    Spouse Name: N/A  . Number of Children: N/A  . Years of Education: N/A   Occupational History  . Not on file.   Social History Main Topics  . Smoking status: Light Tobacco Smoker -- 1.00 packs/day for 45 years    Types: Cigarettes    Start date: 10/04/1964  . Smokeless tobacco: Never Used  . Alcohol Use: 1.8 oz/week    3 Shots of liquor per week  . Drug Use: No  . Sexual Activity: Yes   Other Topics Concern  . Not on file   Social History Narrative   Disabled.  Former truck Software engineer. Divorced and remarried x 2           Filed Vitals:   11/11/15 1124  BP: 176/88  Pulse: 63  Height: 6' (1.829 m)  Weight: 216 lb (97.977 kg)  SpO2: 99%    PHYSICAL EXAM General: NAD HEENT: Normal. Neck: No JVD, no thyromegaly. Lungs: Clear to auscultation bilaterally with normal respiratory effort. CV: Nondisplaced PMI.  Regular rate and rhythm, normal S1/S2, no S3/S4, no murmur. No pretibial or periankle edema.   Abdomen: Soft, obese.  Neurologic: Alert and oriented.  Psych: Normal affect. Skin: Normal. Musculoskeletal: No gross deformities.    ECG: Most recent ECG reviewed.      ASSESSMENT AND PLAN: 1. CAD/CABG with h/o NSTEMI and PCI: Symptomatically stable. Continue aspirin, statin, Coreg, lisinopril, and Ranexa. No ischemia with stress testing in 02/2015.  2. Essential HTN: Markedly elevated today. Will increase amlodipine to 10 mg.  3. Hyperlipidemia: Lipid panel 03/02/15 total cholesterol 161, triglycerides 294, HDL 33, LDL 69. Continue Crestor 40 mg.  Dispo: f/u 6 months.   Kate Sable, M.D., F.A.C.C.

## 2015-11-26 ENCOUNTER — Other Ambulatory Visit: Payer: Self-pay | Admitting: Adult Health

## 2016-04-02 DIAGNOSIS — S82301A Unspecified fracture of lower end of right tibia, initial encounter for closed fracture: Secondary | ICD-10-CM | POA: Diagnosis not present

## 2016-04-02 DIAGNOSIS — S82201A Unspecified fracture of shaft of right tibia, initial encounter for closed fracture: Secondary | ICD-10-CM | POA: Diagnosis not present

## 2016-04-02 DIAGNOSIS — W010XXA Fall on same level from slipping, tripping and stumbling without subsequent striking against object, initial encounter: Secondary | ICD-10-CM | POA: Diagnosis not present

## 2016-04-02 DIAGNOSIS — S82831A Other fracture of upper and lower end of right fibula, initial encounter for closed fracture: Secondary | ICD-10-CM | POA: Diagnosis not present

## 2016-04-02 DIAGNOSIS — S82401A Unspecified fracture of shaft of right fibula, initial encounter for closed fracture: Secondary | ICD-10-CM | POA: Diagnosis not present

## 2016-04-04 ENCOUNTER — Encounter: Payer: Self-pay | Admitting: Cardiovascular Disease

## 2016-04-04 ENCOUNTER — Ambulatory Visit (INDEPENDENT_AMBULATORY_CARE_PROVIDER_SITE_OTHER): Payer: Commercial Managed Care - HMO | Admitting: Orthopaedic Surgery

## 2016-04-04 DIAGNOSIS — M79604 Pain in right leg: Secondary | ICD-10-CM | POA: Diagnosis not present

## 2016-04-04 DIAGNOSIS — S82121D Displaced fracture of lateral condyle of right tibia, subsequent encounter for closed fracture with routine healing: Secondary | ICD-10-CM | POA: Diagnosis not present

## 2016-04-05 NOTE — H&P (Signed)
Joni Fears, MD   Biagio Borg, PA-C [address] [place], [place]  [address]                             [phone]   ORTHOPAEDIC HISTORY & PHYSICAL  [facility] MRN:  [id number] DOB/SEX:  May 16, 1951/male  CHIEF COMPLAINT:  Painful right lower leg  HISTORY: Mr. Fullmer is 65 years old and he is accompanied by a family member.  He is here for evaluation of an injury to his right lower extremity.  He notes that he had slipped on some cow manure 2 days ago near his home in [place].  He felt his bone "snap".  He was taken directly to [facility] where x-rays were obtained.  He was placed in a splint and given hydrocodone and asked to follow up with an orthopedist.  He has been using 2 crutches, non-weightbearing.  He is taking hydrocodone for pain.  He has been relatively comfortable.    PAST MEDICAL HISTORY: Patient Active Problem List   Diagnosis Date Noted  . Chest pain   . Elevated troponin 03/02/2015  . NSTEMI (non-ST elevated myocardial infarction) ([place]) 03/08/2012  . Unstable angina ([place]) 03/04/2012  . CAD (coronary artery disease)   . Hypertension   . Personal history of noncompliance with medical treatment   . S/P AAA repair   . Peripheral vascular disease ([place])   . Tobacco abuse   . Hyperlipidemia   . CAD, bypass graft    Past Medical History:  Diagnosis Date  . Closed fracture of right distal tibia   . Coronary artery disease    a. 1999 Inf MI;  b. 2003 CABGx6;  c. 2008 NSTEMI, occluded SVG to marginal and diagonal system. 2 DES placed to marginal system;  c. NSTEMI - 02/2012 Xience Xpedition DES to oS-OM and POBA to mid lesion;  c. 10/2013 NSTEMI/PCI: VG->OM 80 ISR, native OM 80/90 small, LIMA->LAD ok w/ 90 dLAD, D1 80, RIMA->RPL ok w/ 95 in RPL (2.5x14 Resolute DES), nl EF.  Marland Kitchen GERD (gastroesophageal reflux disease)   . Hyperlipidemia   . Hypertension   . Myocardial infarction   . S/P AAA repair   . Tobacco abuse    Past Surgical History:    Procedure Laterality Date  . ABDOMINAL AORTIC ANEURYSM REPAIR  2006  . APPENDECTOMY    . CORONARY ANGIOPLASTY WITH STENT PLACEMENT    . CORONARY ARTERY BYPASS GRAFT  2003  . HERNIA REPAIR    . LEFT HEART CATHETERIZATION WITH CORONARY ANGIOGRAM N/A 03/05/2012   Procedure: LEFT HEART CATHETERIZATION WITH CORONARY ANGIOGRAM;  Surgeon: Hillary Bow, MD;  Location: [facility] CATH LAB;  Service: Cardiovascular;  Laterality: N/A;  . LEFT HEART CATHETERIZATION WITH CORONARY ANGIOGRAM N/A 10/23/2013   Procedure: LEFT HEART CATHETERIZATION WITH CORONARY ANGIOGRAM;  Surgeon: Blane Ohara, MD;  Location: [facility] CATH LAB;  Service: Cardiovascular;  Laterality: N/A;  . LEFT HEART CATHETERIZATION WITH CORONARY/GRAFT ANGIOGRAM  10/22/2013   Procedure: LEFT HEART CATHETERIZATION WITH Beatrix Fetters;  Surgeon: Leonie Man, MD;  Location: [facility] CATH LAB;  Service: Cardiovascular;;  . PERCUTANEOUS CORONARY STENT INTERVENTION (PCI-S) N/A 03/07/2012   Procedure: PERCUTANEOUS CORONARY STENT INTERVENTION (PCI-S);  Surgeon: Sherren Mocha, MD;  Location: [facility] CATH LAB;  Service: Cardiovascular;  Laterality: N/A;  . PERCUTANEOUS CORONARY STENT INTERVENTION (PCI-S) Right 10/23/2013   Procedure: PERCUTANEOUS CORONARY STENT INTERVENTION (PCI-S);  Surgeon: Blane Ohara, MD;  Location: [place] CATH LAB;  Service: Cardiovascular;  Laterality: Right;     MEDICATIONS:   Prescriptions Prior to Admission  Medication Sig Dispense Refill Last Dose  . acetaminophen (TYLENOL) 325 MG tablet Take 2 tablets (650 mg total) by mouth every 4 (four) hours as needed for headache or mild pain.   04/03/2016  . amLODipine (NORVASC) 5 MG tablet TAKE 1 TABLET BY MOUTH EVERY DAY 30 tablet 6 04/04/2016  . aspirin EC 81 MG EC tablet Take 1 tablet (81 mg total) by mouth daily. 30 tablet 11 04/04/2016  . carvedilol (COREG) 6.25 MG tablet TAKE 1 TABLET BY MOUTH TWICE DAILY WITH A MEAL. 60 tablet 6 04/07/2016 at 0655  . HYDROcodone-acetaminophen  (NORCO/VICODIN) 5-325 MG tablet Take 1-2 tablets by mouth every 6 (six) hours as needed. For pain.     Marland Kitchen lisinopril (PRINIVIL,ZESTRIL) 20 MG tablet TAKE 1 TABLET BY MOUTH EVERY DAY 30 tablet 6 04/06/2016 at Unknown time  . nitroGLYCERIN (NITROSTAT) 0.4 MG SL tablet Place 1 tablet (0.4 mg total) under the tongue every 5 (five) minutes x 3 doses as needed for chest pain. 25 tablet 4 04/03/2016  . pantoprazole (PROTONIX) 40 MG tablet TAKE 1 TABLET BY MOUTH TWICE DAILY 60 tablet 6 04/07/2016 at 0655  . RANEXA 1000 MG SR tablet TAKE 1 TABLET BY MOUTH TWICE DAILY 60 tablet 6 04/06/2016 at Unknown time  . rosuvastatin (CRESTOR) 40 MG tablet TAKE 1 TABLET BY MOUTH DAILY AT 6 PM. 30 tablet 6 04/06/2016 at Unknown time  . amLODipine (NORVASC) 10 MG tablet Take 1 tablet (10 mg total) by mouth daily. (Patient not taking: Reported on 04/05/2016) 90 tablet 3 Not Taking at Unknown time    ALLERGIES:   Allergies  Allergen Reactions  . No Known Allergies     REVIEW OF SYSTEMS:  A comprehensive review of systems was negative except for: Cardiovascular: positive for chest pain and hypertension and MI   FAMILY HISTORY:   Family History  Problem Relation Age of Onset  . Coronary artery disease      SOCIAL HISTORY:   Social History  Substance Use Topics  . Smoking status: Current Every Day Smoker    Packs/day: 0.50    Years: 45.00    Types: Cigarettes    Start date: 10/04/1964  . Smokeless tobacco: Never Used  . Alcohol use 1.8 oz/week    3 Shots of liquor per week     Comment: occasional      EXAMINATION: Vital signs in last 24 hours: Temp:  [98.4 F (36.9 C)] 98.4 F (36.9 C) (10/19 0629) Pulse Rate:  [60] 60 (10/19 0629) Resp:  [20] 20 (10/19 0629) BP: (143)/(68) 143/68 (10/19 0629) SpO2:  [96 %] 96 % (10/19 0629) Weight:  [90.7 kg (200 lb)] 90.7 kg (200 lb) (10/19 0629)  Head is normocephalic.   Eyes:  Pupils equal, round and reactive to light and accommodation.  Extraocular  intact. ENT: Ears, nose, and throat were benign.   Neck: supple, no bruits were noted.   Chest: good expansion.   Lungs: essentially clear.   Cardiac: regular rhythm and rate, normal S1, S2.  No murmurs appreciated. Pulses :  Trace bilateral and symmetric in lower extremities. Abdomen is scaphoid, soft, nontender, no masses palpable, normal bowel sounds present. CNS:  He is oriented x3 and cranial nerves II-XII grossly intact. Breast, rectal, and genital exams: not performed and not indicated for an orthopedic evaluation. Musculoskeletal: He is comfortable with his right leg  on the examination table.  The leg is splinted.  The toes are a little bit swollen.  There was a large effusion of the knee that his family member said occurred yesterday.  It looks like he is neurologically intact.  He has good capillary refill to his toes.  The compartments appear to be perfectly supple.   Imaging Review proximal comminuted minimally displaced fibula fracture.  The knee has a distal third fracture off the tibia at the junction of the middle and distal thirds and there is some displacement.  There is also significant nondisplaced comminution with one crack extending distally into the ankle joint.   ASSESSMENT: right proximal comminuted minimally displaced fibula fracture with a distal third fracture of the tibia at the junction of the middle and distal thirds with some displacement.    Past Medical History:  Diagnosis Date  . Closed fracture of right distal tibia   . Coronary artery disease    a. 1999 Inf MI;  b. 2003 CABGx6;  c. 2008 NSTEMI, occluded SVG to marginal and diagonal system. 2 DES placed to marginal system;  c. NSTEMI - 02/2012 Xience Xpedition DES to oS-OM and POBA to mid lesion;  c. 10/2013 NSTEMI/PCI: VG->OM 80 ISR, native OM 80/90 small, LIMA->LAD ok w/ 90 dLAD, D1 80, RIMA->RPL ok w/ 95 in RPL (2.5x14 Resolute DES), nl EF.  Marland Kitchen GERD (gastroesophageal reflux disease)   . Hyperlipidemia   .  Hypertension   . Myocardial infarction   . S/P AAA repair   . Tobacco abuse     PLAN: Plan for right IM nail right tibia  The procedure,  risks, and benefits of surgery were presented and reviewed. The risks including but not limited to infection, blood clots, vascular and nerve injury, stiffness,  among others were discussed. The patient acknowledged the explanation, agreed to proceed.   Mike Craze [place], [place] [phone]  04/07/2016 7:41 AM

## 2016-04-06 ENCOUNTER — Encounter (HOSPITAL_COMMUNITY): Payer: Self-pay | Admitting: *Deleted

## 2016-04-06 DIAGNOSIS — Z79899 Other long term (current) drug therapy: Secondary | ICD-10-CM

## 2016-04-06 DIAGNOSIS — Z9119 Patient's noncompliance with other medical treatment and regimen: Secondary | ICD-10-CM

## 2016-04-06 DIAGNOSIS — S82251A Displaced comminuted fracture of shaft of right tibia, initial encounter for closed fracture: Principal | ICD-10-CM | POA: Diagnosis present

## 2016-04-06 DIAGNOSIS — Z7982 Long term (current) use of aspirin: Secondary | ICD-10-CM

## 2016-04-06 DIAGNOSIS — I739 Peripheral vascular disease, unspecified: Secondary | ICD-10-CM | POA: Diagnosis present

## 2016-04-06 DIAGNOSIS — Z955 Presence of coronary angioplasty implant and graft: Secondary | ICD-10-CM

## 2016-04-06 DIAGNOSIS — W010XXA Fall on same level from slipping, tripping and stumbling without subsequent striking against object, initial encounter: Secondary | ICD-10-CM | POA: Diagnosis present

## 2016-04-06 DIAGNOSIS — Z951 Presence of aortocoronary bypass graft: Secondary | ICD-10-CM

## 2016-04-06 DIAGNOSIS — F17210 Nicotine dependence, cigarettes, uncomplicated: Secondary | ICD-10-CM | POA: Diagnosis present

## 2016-04-06 DIAGNOSIS — E785 Hyperlipidemia, unspecified: Secondary | ICD-10-CM | POA: Diagnosis present

## 2016-04-06 DIAGNOSIS — K219 Gastro-esophageal reflux disease without esophagitis: Secondary | ICD-10-CM | POA: Diagnosis present

## 2016-04-06 DIAGNOSIS — I10 Essential (primary) hypertension: Secondary | ICD-10-CM | POA: Diagnosis present

## 2016-04-06 DIAGNOSIS — I2510 Atherosclerotic heart disease of native coronary artery without angina pectoris: Secondary | ICD-10-CM | POA: Diagnosis present

## 2016-04-06 DIAGNOSIS — Z8249 Family history of ischemic heart disease and other diseases of the circulatory system: Secondary | ICD-10-CM

## 2016-04-06 DIAGNOSIS — I252 Old myocardial infarction: Secondary | ICD-10-CM

## 2016-04-06 MED ORDER — ACETAMINOPHEN 10 MG/ML IV SOLN
1000.0000 mg | Freq: Once | INTRAVENOUS | Status: AC
  Administered 2016-04-07: 1000 mg via INTRAVENOUS
  Filled 2016-04-06: qty 100

## 2016-04-06 MED ORDER — SODIUM CHLORIDE 0.9 % IV SOLN: INTRAVENOUS | Status: DC

## 2016-04-06 MED ORDER — CEFAZOLIN SODIUM-DEXTROSE 2-4 GM/100ML-% IV SOLN
2.0000 g | INTRAVENOUS | Status: AC
  Administered 2016-04-07: 2 g via INTRAVENOUS
  Filled 2016-04-06: qty 100

## 2016-04-06 NOTE — Progress Notes (Signed)
Pt SDW pre-op call completed by pt daughter Leveda Anna as advised. Daughter stated that pt was advised to stop taking Aspirin and Ranexa pre-operatively, last dose of both were Monday, April 04, 2016. Daughter was made aware to have pt stop taking vitamins, fish oil and herbal medications. Do not take any NSAIDs ie: Ibuprofen, Advil, Naproxen, BC abnd Goody powder. Daughter verbalized understanding of all pre-op instructions. Please measure pt neck and input result to complete Apnea Screening on DOS. Please see anesthesia note.

## 2016-04-06 NOTE — Progress Notes (Signed)
Anesthesia Chart Review:  Clifford Jones is a same day work up.   - Cardiologist is Kate Sable, MD, last office visit 11/11/15.  - PCP is Rory Percy, MD.   Clifford Jones is a 65 year old male scheduled for fixation of R tibia fracture on 04/07/2016 with Joni Fears, MD.   PMH includes: CAD (s/p CABG x6 2003; DES to marginal system 2008; DES to OS-OM and POBA with mid lesion 2013; DES to RPLA 2015), AAA (s/p repair), HTN, hyperlipidemia, GERD. Current smoker. BMI 29  Medications include: Amlodipine, ASA, carvedilol, lisinopril, Protonix, Ranexa, Crestor.  Labs will be obtained DOS.   EKG will be obtained DOS.   Nuclear stress test 03/02/15:   There was no ST segment deviation noted during stress.  T wave inversion of 1 mm was noted during stress in the I, II, aVF, V5 and V6 leads. T wave inversion persisted.  Defect 1: There is a medium defect of severe severity present in the apical anterior, apical inferior and apex location.  Findings consistent with prior myocardial infarction. There is no evidence of ischemia.  This is an intermediate risk study.  Nuclear stress EF: 39%.  Echo 03/02/15:  - Left ventricle: The cavity size was normal. There was mild concentric hypertrophy. Systolic function was normal. The estimated ejection fraction was in the range of 55% to 60%  Possible hypokinesis of the basalinferior myocardium. Marked bradycardia limits evaluation of LV diastolic function. - Aortic valve: There was trivial regurgitation. - Left atrium: The atrium was moderately dilated. - Right ventricle: Systolic function was mildly reduced.  Cardiac cath/PTCA 10/23/13: Continued patency of the RIMA to RCA with severe stenosis of the PLA, treated successfully with DES.  Cardiac cath 10/22/13:   Severe native CAD with several potential culprit lesions for angina/non-STEMI, however the RIMA-RCA has not been adequately visualized  Potential culprit lesions include:  Ostial ISR of SVG-OM roughly 80%  -the downstream graft is extremely disease with tandem 90 and 80% lesions in a very small vessel; not favorable for PCI  Mid LAD beyond the LIMA insertion 90% subtotal occlusion - small caliber LAD, unfavorable PCI target   Native first diagonal branch roughly 80% - small caliber of possible PTCA target   Unable to visualize RIMA-RCA -- would likely need to use right radial approach  Surprisingly well preserved LVEF with elevated LVEDP in the setting of systemic hypertension  Very tortuous aorta both in the abdominal and thoracic region. This includes extreme tortuosity of both the left subclavian and innominate artery on the right. Simply unable to advance a catheter into the innominate artery to the right subclavian.  If Clifford Jones is without CV sx, and labs/EKG are acceptable for surgery, I anticipate Clifford Jones can proceed as scheduled.   Willeen Cass, FNP-BC [facility]/Anesthesiology Phone: [phone] 04/06/2016 2:16 PM

## 2016-04-07 ENCOUNTER — Ambulatory Visit (HOSPITAL_COMMUNITY): Payer: PRIVATE HEALTH INSURANCE | Admitting: Emergency Medicine

## 2016-04-07 ENCOUNTER — Ambulatory Visit (HOSPITAL_COMMUNITY): Payer: PRIVATE HEALTH INSURANCE

## 2016-04-07 ENCOUNTER — Inpatient Hospital Stay (HOSPITAL_COMMUNITY)
Admission: RE | Admit: 2016-04-07 | Discharge: 2016-04-08 | DRG: 494 | Disposition: A | Discharge status: Home / Self Care | Payer: PRIVATE HEALTH INSURANCE | Source: Ambulatory Visit | Attending: Orthopaedic Surgery | Admitting: Orthopaedic Surgery

## 2016-04-07 ENCOUNTER — Encounter (HOSPITAL_COMMUNITY)
Admission: RE | Disposition: A | Discharge status: Home / Self Care | Payer: Self-pay | Source: Ambulatory Visit | Attending: Orthopaedic Surgery

## 2016-04-07 DIAGNOSIS — I2510 Atherosclerotic heart disease of native coronary artery without angina pectoris: Secondary | ICD-10-CM | POA: Diagnosis not present

## 2016-04-07 DIAGNOSIS — S82309A Unspecified fracture of lower end of unspecified tibia, initial encounter for closed fracture: Secondary | ICD-10-CM | POA: Diagnosis present

## 2016-04-07 DIAGNOSIS — S82251A Displaced comminuted fracture of shaft of right tibia, initial encounter for closed fracture: Secondary | ICD-10-CM | POA: Diagnosis not present

## 2016-04-07 DIAGNOSIS — I739 Peripheral vascular disease, unspecified: Secondary | ICD-10-CM | POA: Diagnosis not present

## 2016-04-07 DIAGNOSIS — I10 Essential (primary) hypertension: Secondary | ICD-10-CM | POA: Diagnosis not present

## 2016-04-07 DIAGNOSIS — Z955 Presence of coronary angioplasty implant and graft: Secondary | ICD-10-CM | POA: Diagnosis not present

## 2016-04-07 DIAGNOSIS — F17210 Nicotine dependence, cigarettes, uncomplicated: Secondary | ICD-10-CM | POA: Diagnosis not present

## 2016-04-07 DIAGNOSIS — Z79899 Other long term (current) drug therapy: Secondary | ICD-10-CM | POA: Diagnosis not present

## 2016-04-07 DIAGNOSIS — S82121A Displaced fracture of lateral condyle of right tibia, initial encounter for closed fracture: Secondary | ICD-10-CM | POA: Diagnosis not present

## 2016-04-07 DIAGNOSIS — K219 Gastro-esophageal reflux disease without esophagitis: Secondary | ICD-10-CM | POA: Diagnosis not present

## 2016-04-07 DIAGNOSIS — I252 Old myocardial infarction: Secondary | ICD-10-CM | POA: Diagnosis not present

## 2016-04-07 DIAGNOSIS — Z01818 Encounter for other preprocedural examination: Secondary | ICD-10-CM

## 2016-04-07 DIAGNOSIS — Z951 Presence of aortocoronary bypass graft: Secondary | ICD-10-CM | POA: Diagnosis not present

## 2016-04-07 DIAGNOSIS — Z8249 Family history of ischemic heart disease and other diseases of the circulatory system: Secondary | ICD-10-CM | POA: Diagnosis not present

## 2016-04-07 DIAGNOSIS — W010XXA Fall on same level from slipping, tripping and stumbling without subsequent striking against object, initial encounter: Secondary | ICD-10-CM | POA: Diagnosis not present

## 2016-04-07 DIAGNOSIS — S82301A Unspecified fracture of lower end of right tibia, initial encounter for closed fracture: Secondary | ICD-10-CM | POA: Diagnosis not present

## 2016-04-07 DIAGNOSIS — Z9119 Patient's noncompliance with other medical treatment and regimen: Secondary | ICD-10-CM | POA: Diagnosis not present

## 2016-04-07 DIAGNOSIS — S82839A Other fracture of upper and lower end of unspecified fibula, initial encounter for closed fracture: Secondary | ICD-10-CM | POA: Diagnosis present

## 2016-04-07 DIAGNOSIS — Z419 Encounter for procedure for purposes other than remedying health state, unspecified: Secondary | ICD-10-CM

## 2016-04-07 DIAGNOSIS — Z7982 Long term (current) use of aspirin: Secondary | ICD-10-CM | POA: Diagnosis not present

## 2016-04-07 DIAGNOSIS — E785 Hyperlipidemia, unspecified: Secondary | ICD-10-CM | POA: Diagnosis not present

## 2016-04-07 DIAGNOSIS — S82831A Other fracture of upper and lower end of right fibula, initial encounter for closed fracture: Secondary | ICD-10-CM | POA: Diagnosis not present

## 2016-04-07 HISTORY — PX: TIBIA IM NAIL INSERTION: SHX2516

## 2016-04-07 HISTORY — DX: Unspecified fracture of lower end of right tibia, initial encounter for closed fracture: S82.301A

## 2016-04-07 LAB — COMPREHENSIVE METABOLIC PANEL
ALBUMIN: 3.5 g/dL (ref 3.5–5.0)
ALT: 10 U/L — ABNORMAL LOW (ref 17–63)
ANION GAP: 7 (ref 5–15)
AST: 12 U/L — ABNORMAL LOW (ref 15–41)
Alkaline Phosphatase: 51 U/L (ref 38–126)
BILIRUBIN TOTAL: 0.7 mg/dL (ref 0.3–1.2)
BUN: 13 mg/dL (ref 6–20)
CO2: 30 mmol/L (ref 22–32)
Calcium: 9 mg/dL (ref 8.9–10.3)
Chloride: 102 mmol/L (ref 101–111)
Creatinine, Ser: 0.8 mg/dL (ref 0.61–1.24)
GFR calc Af Amer: >60 mL/min (ref >60)
Glucose, Bld: 120 mg/dL — ABNORMAL HIGH (ref 65–99)
POTASSIUM: 4.3 mmol/L (ref 3.5–5.1)
Sodium: 139 mmol/L (ref 135–145)
TOTAL PROTEIN: 6.5 g/dL (ref 6.5–8.1)

## 2016-04-07 LAB — CBC WITH DIFFERENTIAL/PLATELET
BASOS PCT: 0 %
Basophils Absolute: 0 10*3/uL (ref 0.0–0.1)
Eosinophils Absolute: 0.5 10*3/uL (ref 0.0–0.7)
Eosinophils Relative: 5 %
HEMATOCRIT: 37 % — AB (ref 39.0–52.0)
Hemoglobin: 12.1 g/dL — ABNORMAL LOW (ref 13.0–17.0)
Lymphocytes Relative: 31 %
Lymphs Abs: 3.1 10*3/uL (ref 0.7–4.0)
MCH: 30.9 pg (ref 26.0–34.0)
MCHC: 32.7 g/dL (ref 30.0–36.0)
MCV: 94.6 fL (ref 78.0–100.0)
MONO ABS: 1 10*3/uL (ref 0.1–1.0)
MONOS PCT: 10 %
NEUTROS ABS: 5.5 10*3/uL (ref 1.7–7.7)
Neutrophils Relative %: 54 %
Platelets: 240 10*3/uL (ref 150–400)
RBC: 3.91 MIL/uL — ABNORMAL LOW (ref 4.22–5.81)
RDW: 12.8 % (ref 11.5–15.5)
WBC: 10.1 10*3/uL (ref 4.0–10.5)

## 2016-04-07 LAB — PROTIME-INR
INR: 1.02
PROTHROMBIN TIME: 13.4 s (ref 11.4–15.2)

## 2016-04-07 LAB — APTT: APTT: 29 s (ref 24–36)

## 2016-04-07 SURGERY — INSERTION, INTRAMEDULLARY ROD, TIBIA
Anesthesia: General | Site: Leg Lower | Laterality: Right

## 2016-04-07 MED ORDER — BACIT-POLY-NEO HC 1 % EX OINT
TOPICAL_OINTMENT | CUTANEOUS | Status: AC
  Filled 2016-04-07: qty 15

## 2016-04-07 MED ORDER — LISINOPRIL 20 MG PO TABS
20.0000 mg | ORAL_TABLET | Freq: Every day | ORAL | Status: DC
  Filled 2016-04-07: qty 1

## 2016-04-07 MED ORDER — HYDROMORPHONE HCL 2 MG/ML IJ SOLN
0.5000 mg | INTRAMUSCULAR | Status: DC | PRN
Start: 2016-04-07 — End: 2016-04-08

## 2016-04-07 MED ORDER — MIDAZOLAM HCL 2 MG/2ML IJ SOLN
INTRAMUSCULAR | Status: AC
  Filled 2016-04-07: qty 2

## 2016-04-07 MED ORDER — ROCURONIUM BROMIDE 100 MG/10ML IV SOLN
INTRAVENOUS | Status: DC | PRN
  Administered 2016-04-07: 30 mg via INTRAVENOUS

## 2016-04-07 MED ORDER — FENTANYL CITRATE (PF) 100 MCG/2ML IJ SOLN
INTRAMUSCULAR | Status: AC
  Filled 2016-04-07: qty 4

## 2016-04-07 MED ORDER — ONDANSETRON HCL 4 MG/2ML IJ SOLN: 4.0000 mg | Freq: Four times a day (QID) | INTRAMUSCULAR | Status: DC | PRN

## 2016-04-07 MED ORDER — HYDROCODONE-ACETAMINOPHEN 7.5-325 MG PO TABS
ORAL_TABLET | ORAL | Status: AC
  Filled 2016-04-07: qty 1

## 2016-04-07 MED ORDER — ROSUVASTATIN CALCIUM 10 MG PO TABS
40.0000 mg | ORAL_TABLET | Freq: Every day | ORAL | Status: DC
  Administered 2016-04-07: 40 mg via ORAL
  Filled 2016-04-07: qty 4

## 2016-04-07 MED ORDER — HYDROCODONE-ACETAMINOPHEN 7.5-325 MG PO TABS
1.0000 | ORAL_TABLET | Freq: Once | ORAL | Status: AC | PRN
  Administered 2016-04-07: 1 via ORAL

## 2016-04-07 MED ORDER — EPHEDRINE SULFATE 50 MG/ML IJ SOLN
INTRAMUSCULAR | Status: DC | PRN
  Administered 2016-04-07 (×2): 10 mg via INTRAVENOUS

## 2016-04-07 MED ORDER — ONDANSETRON HCL 4 MG/2ML IJ SOLN: 4.0000 mg | Freq: Once | INTRAMUSCULAR | Status: DC | PRN

## 2016-04-07 MED ORDER — SORBITOL 70 % SOLN: 30.0000 mL | Freq: Every day | Status: DC | PRN

## 2016-04-07 MED ORDER — 0.9 % SODIUM CHLORIDE (POUR BTL) OPTIME
TOPICAL | Status: DC | PRN
  Administered 2016-04-07: 1000 mL

## 2016-04-07 MED ORDER — SODIUM CHLORIDE 0.9 % IV SOLN
INTRAVENOUS | Status: DC
  Administered 2016-04-07: 22:00:00 via INTRAVENOUS

## 2016-04-07 MED ORDER — HYDROMORPHONE HCL 2 MG/ML IJ SOLN
INTRAMUSCULAR | Status: AC
  Filled 2016-04-07: qty 1

## 2016-04-07 MED ORDER — SUGAMMADEX SODIUM 200 MG/2ML IV SOLN
INTRAVENOUS | Status: AC
  Filled 2016-04-07: qty 2

## 2016-04-07 MED ORDER — SUCCINYLCHOLINE CHLORIDE 20 MG/ML IJ SOLN
INTRAMUSCULAR | Status: DC | PRN
  Administered 2016-04-07: 80 mg via INTRAVENOUS

## 2016-04-07 MED ORDER — FENTANYL CITRATE (PF) 100 MCG/2ML IJ SOLN
INTRAMUSCULAR | Status: DC | PRN
  Administered 2016-04-07: 50 ug via INTRAVENOUS
  Administered 2016-04-07: 100 ug via INTRAVENOUS
  Administered 2016-04-07 (×2): 50 ug via INTRAVENOUS

## 2016-04-07 MED ORDER — METHOCARBAMOL 500 MG PO TABS
500.0000 mg | ORAL_TABLET | Freq: Four times a day (QID) | ORAL | Status: DC | PRN
  Administered 2016-04-08: 500 mg via ORAL
  Filled 2016-04-07: qty 1

## 2016-04-07 MED ORDER — ASPIRIN EC 81 MG PO TBEC
81.0000 mg | DELAYED_RELEASE_TABLET | Freq: Every day | ORAL | Status: DC
  Filled 2016-04-07: qty 1

## 2016-04-07 MED ORDER — DIPHENHYDRAMINE HCL 12.5 MG/5ML PO ELIX: 12.5000 mg | ORAL_SOLUTION | ORAL | Status: DC | PRN

## 2016-04-07 MED ORDER — FENTANYL CITRATE (PF) 100 MCG/2ML IJ SOLN
INTRAMUSCULAR | Status: AC
  Filled 2016-04-07: qty 2

## 2016-04-07 MED ORDER — BACIT-POLY-NEO HC 1 % EX OINT
TOPICAL_OINTMENT | CUTANEOUS | Status: DC | PRN
  Administered 2016-04-07: 1 via TOPICAL

## 2016-04-07 MED ORDER — LIDOCAINE HCL (CARDIAC) 20 MG/ML IV SOLN
INTRAVENOUS | Status: DC | PRN
  Administered 2016-04-07: 60 mg via INTRAVENOUS

## 2016-04-07 MED ORDER — ACETAMINOPHEN 10 MG/ML IV SOLN
1000.0000 mg | Freq: Four times a day (QID) | INTRAVENOUS | Status: DC
  Administered 2016-04-07 – 2016-04-08 (×2): 1000 mg via INTRAVENOUS
  Filled 2016-04-07 (×3): qty 100

## 2016-04-07 MED ORDER — METOCLOPRAMIDE HCL 5 MG/ML IJ SOLN: 5.0000 mg | Freq: Three times a day (TID) | INTRAMUSCULAR | Status: DC | PRN

## 2016-04-07 MED ORDER — CEFAZOLIN SODIUM-DEXTROSE 2-4 GM/100ML-% IV SOLN
2.0000 g | Freq: Four times a day (QID) | INTRAVENOUS | Status: AC
  Administered 2016-04-07 – 2016-04-08 (×3): 2 g via INTRAVENOUS
  Filled 2016-04-07 (×3): qty 100

## 2016-04-07 MED ORDER — MAGNESIUM HYDROXIDE 400 MG/5ML PO SUSP: 30.0000 mL | Freq: Every day | ORAL | Status: DC | PRN

## 2016-04-07 MED ORDER — HYDROMORPHONE HCL 2 MG/ML IJ SOLN
0.2500 mg | INTRAMUSCULAR | Status: DC | PRN
  Administered 2016-04-07 (×2): 0.5 mg via INTRAVENOUS

## 2016-04-07 MED ORDER — PANTOPRAZOLE SODIUM 40 MG PO TBEC
40.0000 mg | DELAYED_RELEASE_TABLET | Freq: Two times a day (BID) | ORAL | Status: DC
  Administered 2016-04-07: 40 mg via ORAL
  Filled 2016-04-07 (×2): qty 1

## 2016-04-07 MED ORDER — LACTATED RINGERS IV SOLN
INTRAVENOUS | Status: DC | PRN
  Administered 2016-04-07 (×2): via INTRAVENOUS

## 2016-04-07 MED ORDER — ONDANSETRON HCL 4 MG/2ML IJ SOLN
INTRAMUSCULAR | Status: AC
  Filled 2016-04-07: qty 2

## 2016-04-07 MED ORDER — INFLUENZA VAC SPLIT QUAD 0.5 ML IM SUSY
0.5000 mL | PREFILLED_SYRINGE | INTRAMUSCULAR | Status: AC
  Administered 2016-04-08: 0.5 mL via INTRAMUSCULAR
  Filled 2016-04-07: qty 0.5

## 2016-04-07 MED ORDER — LIDOCAINE 2% (20 MG/ML) 5 ML SYRINGE
INTRAMUSCULAR | Status: AC
  Filled 2016-04-07: qty 5

## 2016-04-07 MED ORDER — ROCURONIUM BROMIDE 10 MG/ML (PF) SYRINGE
PREFILLED_SYRINGE | INTRAVENOUS | Status: AC
  Filled 2016-04-07: qty 10

## 2016-04-07 MED ORDER — RANOLAZINE ER 500 MG PO TB12
1000.0000 mg | ORAL_TABLET | Freq: Two times a day (BID) | ORAL | Status: DC
  Administered 2016-04-07 (×2): 1000 mg via ORAL
  Filled 2016-04-07 (×3): qty 2

## 2016-04-07 MED ORDER — CARVEDILOL 6.25 MG PO TABS
6.2500 mg | ORAL_TABLET | Freq: Two times a day (BID) | ORAL | Status: DC
  Administered 2016-04-07: 6.25 mg via ORAL
  Filled 2016-04-07 (×2): qty 1

## 2016-04-07 MED ORDER — ALBUTEROL SULFATE HFA 108 (90 BASE) MCG/ACT IN AERS
INHALATION_SPRAY | RESPIRATORY_TRACT | Status: DC | PRN
  Administered 2016-04-07 (×2): 2 via RESPIRATORY_TRACT

## 2016-04-07 MED ORDER — PROPOFOL 10 MG/ML IV BOLUS
INTRAVENOUS | Status: AC
  Filled 2016-04-07: qty 20

## 2016-04-07 MED ORDER — PROPOFOL 10 MG/ML IV BOLUS
INTRAVENOUS | Status: DC | PRN
  Administered 2016-04-07: 150 mg via INTRAVENOUS
  Administered 2016-04-07: 20 mg via INTRAVENOUS

## 2016-04-07 MED ORDER — PHENYLEPHRINE HCL 10 MG/ML IJ SOLN
INTRAVENOUS | Status: DC | PRN
  Administered 2016-04-07: 30 ug/min via INTRAVENOUS

## 2016-04-07 MED ORDER — SUGAMMADEX SODIUM 200 MG/2ML IV SOLN
INTRAVENOUS | Status: DC | PRN
  Administered 2016-04-07: 190 mg via INTRAVENOUS

## 2016-04-07 MED ORDER — METOCLOPRAMIDE HCL 5 MG PO TABS
5.0000 mg | ORAL_TABLET | Freq: Three times a day (TID) | ORAL | Status: DC | PRN
  Administered 2016-04-08: 10 mg via ORAL
  Filled 2016-04-07: qty 2

## 2016-04-07 MED ORDER — ONDANSETRON HCL 4 MG PO TABS: 4.0000 mg | ORAL_TABLET | Freq: Four times a day (QID) | ORAL | Status: DC | PRN

## 2016-04-07 MED ORDER — OXYCODONE HCL 5 MG PO TABS
5.0000 mg | ORAL_TABLET | ORAL | Status: DC | PRN
  Administered 2016-04-07 – 2016-04-08 (×4): 10 mg via ORAL
  Filled 2016-04-07 (×4): qty 2

## 2016-04-07 MED ORDER — AMLODIPINE BESYLATE 5 MG PO TABS
5.0000 mg | ORAL_TABLET | Freq: Every day | ORAL | Status: DC
  Filled 2016-04-07: qty 1

## 2016-04-07 MED ORDER — DOCUSATE SODIUM 100 MG PO CAPS
100.0000 mg | ORAL_CAPSULE | Freq: Two times a day (BID) | ORAL | Status: DC
  Administered 2016-04-07: 100 mg via ORAL
  Filled 2016-04-07 (×2): qty 1

## 2016-04-07 MED ORDER — RANOLAZINE ER 500 MG PO TB12: 1000.0000 mg | ORAL_TABLET | Freq: Two times a day (BID) | ORAL | Status: DC

## 2016-04-07 MED ORDER — ASPIRIN 81 MG PO TBEC: 81.0000 mg | DELAYED_RELEASE_TABLET | Freq: Every day | ORAL | Status: DC

## 2016-04-07 MED ORDER — METHOCARBAMOL 1000 MG/10ML IJ SOLN
500.0000 mg | Freq: Four times a day (QID) | INTRAVENOUS | Status: DC | PRN
  Filled 2016-04-07: qty 5

## 2016-04-07 MED ORDER — NITROGLYCERIN 0.4 MG SL SUBL: 0.4000 mg | SUBLINGUAL_TABLET | SUBLINGUAL | Status: DC | PRN

## 2016-04-07 SURGICAL SUPPLY — 63 items
BANDAGE ACE 4X5 VEL STRL LF (GAUZE/BANDAGES/DRESSINGS) IMPLANT
BANDAGE ACE 6X5 VEL STRL LF (GAUZE/BANDAGES/DRESSINGS) ×3 IMPLANT
BANDAGE ESMARK 6X9 LF (GAUZE/BANDAGES/DRESSINGS) ×1 IMPLANT
BIT DRILL 3.8X6 NS (BIT) ×3 IMPLANT
BIT DRILL 4.4 NS (BIT) ×3 IMPLANT
BLADE SURG 15 STRL LF DISP TIS (BLADE) ×1 IMPLANT
BLADE SURG 15 STRL SS (BLADE) ×2
BNDG COHESIVE 4X5 TAN STRL (GAUZE/BANDAGES/DRESSINGS) ×6 IMPLANT
BNDG ESMARK 6X9 LF (GAUZE/BANDAGES/DRESSINGS) ×3
BNDG GAUZE ELAST 4 BULKY (GAUZE/BANDAGES/DRESSINGS) ×3 IMPLANT
COVER SURGICAL LIGHT HANDLE (MISCELLANEOUS) ×3 IMPLANT
CUFF TOURNIQUET SINGLE 34IN LL (TOURNIQUET CUFF) ×3 IMPLANT
CUFF TOURNIQUET SINGLE 44IN (TOURNIQUET CUFF) IMPLANT
DRAPE C-ARM 42X72 X-RAY (DRAPES) ×3 IMPLANT
DRAPE C-ARMOR (DRAPES) ×3 IMPLANT
DRAPE IMP U-DRAPE 54X76 (DRAPES) ×3 IMPLANT
DRAPE INCISE IOBAN 66X45 STRL (DRAPES) ×3 IMPLANT
DRAPE ORTHO SPLIT 77X108 STRL (DRAPES) ×4
DRAPE SURG ORHT 6 SPLT 77X108 (DRAPES) ×2 IMPLANT
DRAPE U-SHAPE 47X51 STRL (DRAPES) ×3 IMPLANT
DURAPREP 26ML APPLICATOR (WOUND CARE) ×6 IMPLANT
ELECT REM PT RETURN 9FT ADLT (ELECTROSURGICAL) ×3
ELECTRODE REM PT RTRN 9FT ADLT (ELECTROSURGICAL) ×1 IMPLANT
EVACUATOR 1/8 PVC DRAIN (DRAIN) IMPLANT
FACESHIELD OPICON LG (MASK) ×6 IMPLANT
GAUZE SPONGE 4X4 12PLY STRL (GAUZE/BANDAGES/DRESSINGS) ×3 IMPLANT
GAUZE XEROFORM 1X8 LF (GAUZE/BANDAGES/DRESSINGS) IMPLANT
GAUZE XEROFORM 5X9 LF (GAUZE/BANDAGES/DRESSINGS) ×3 IMPLANT
GLOVE BIO SURGEON STRL SZ8 (GLOVE) ×3 IMPLANT
GLOVE BIOGEL PI IND STRL 6 (GLOVE) ×1 IMPLANT
GLOVE BIOGEL PI IND STRL 8 (GLOVE) ×1 IMPLANT
GLOVE BIOGEL PI IND STRL 8.5 (GLOVE) ×2 IMPLANT
GLOVE BIOGEL PI INDICATOR 6 (GLOVE) ×2
GLOVE BIOGEL PI INDICATOR 8 (GLOVE) ×2
GLOVE BIOGEL PI INDICATOR 8.5 (GLOVE) ×4
GLOVE ECLIPSE 8.0 STRL XLNG CF (GLOVE) ×6 IMPLANT
GLOVE SURG ORTHO 8.5 STRL (GLOVE) ×6 IMPLANT
GOWN STRL REUS W/ TWL LRG LVL3 (GOWN DISPOSABLE) ×1 IMPLANT
GOWN STRL REUS W/ TWL XL LVL3 (GOWN DISPOSABLE) ×2 IMPLANT
GOWN STRL REUS W/TWL LRG LVL3 (GOWN DISPOSABLE) ×2
GOWN STRL REUS W/TWL XL LVL3 (GOWN DISPOSABLE) ×4
KIT BASIN OR (CUSTOM PROCEDURE TRAY) ×3 IMPLANT
KIT ROOM TURNOVER OR (KITS) ×3 IMPLANT
NAIL TIBIAL 9MMX36CM (Nail) ×3 IMPLANT
PACK GENERAL/GYN (CUSTOM PROCEDURE TRAY) ×3 IMPLANT
PACK UNIVERSAL I (CUSTOM PROCEDURE TRAY) ×3 IMPLANT
PAD ARMBOARD 7.5X6 YLW CONV (MISCELLANEOUS) ×6 IMPLANT
PIN GUIDE 3.2 903003004 (MISCELLANEOUS) ×3 IMPLANT
SCREW ACECAP 42MM (Screw) ×3 IMPLANT
SCREW PROXIMAL DEPUY (Screw) ×4 IMPLANT
SCREW PRXML FT 40X5.5XNS LF (Screw) ×1 IMPLANT
SCREW PRXML FT 50X5.5XLCK NS (Screw) ×1 IMPLANT
STAPLER VISISTAT 35W (STAPLE) ×3 IMPLANT
SUT VIC AB 0 CT1 27 (SUTURE) ×2
SUT VIC AB 0 CT1 27XBRD ANBCTR (SUTURE) ×1 IMPLANT
SUT VIC AB 2-0 CT1 27 (SUTURE) ×2
SUT VIC AB 2-0 CT1 TAPERPNT 27 (SUTURE) ×1 IMPLANT
TOWEL OR 17X26 10 PK STRL BLUE (TOWEL DISPOSABLE) ×3 IMPLANT
TUBE CONNECTING 12'X1/4 (SUCTIONS) ×1
TUBE CONNECTING 12X1/4 (SUCTIONS) ×2 IMPLANT
WIRE G INTRAMED 80X3.0 2810010 (MISCELLANEOUS) ×3 IMPLANT
WRAP KNEE MAXI GEL POST OP (GAUZE/BANDAGES/DRESSINGS) ×3 IMPLANT
YANKAUER SUCT BULB TIP NO VENT (SUCTIONS) ×3 IMPLANT

## 2016-04-07 NOTE — Transfer of Care (Signed)
Immediate Anesthesia Transfer of Care Note  Patient: Clifford Jones  Procedure(s) Performed: Procedure(s): FIXATION OF RIGHT TIBIA FRACTURE (Right)  Patient Location: PACU  Anesthesia Type:General  Level of Consciousness: awake, alert  and patient cooperative  Airway & Oxygen Therapy: Patient Spontanous Breathing and Patient connected to face mask oxygen  Post-op Assessment: Report given to RN and Post -op Vital signs reviewed and stable  Post vital signs: Reviewed and stable  Last Vitals:  Vitals:   04/07/16 0629 04/07/16 0944  BP: (!) 143/68   Pulse: 60   Resp: 20   Temp: 36.9 C 36.8 C    Last Pain:  Vitals:   04/07/16 0656  TempSrc:   PainSc: 7          Complications: No apparent anesthesia complications

## 2016-04-07 NOTE — OR Nursing (Signed)
Dentures placed in clear bag on patients chart with patient stickers attached.

## 2016-04-07 NOTE — Progress Notes (Signed)
Care of pt assumed by MA Hilliary Jock RN 

## 2016-04-07 NOTE — Op Note (Signed)
NAME:  Clifford Jones, Clifford Jones NO.:  [id number]  MEDICAL RECORD NO.:  [id number]  LOCATION:  [facility]                         FACILITY:  [facility] [facility]  PHYSICIAN:  Vonna Kotyk. Whitfield, M.D.DATE OF BIRTH:  1951/02/23  DATE OF PROCEDURE:  04/07/2016 DATE OF DISCHARGE:                              OPERATIVE REPORT   PREOPERATIVE DIAGNOSIS:  Closed, comminuted, displaced right tibia fracture.  POSTOPERATIVE DIAGNOSIS:  Closed, comminuted, displaced right tibia fracture.  PROCEDURE:  Closed reduction and intramedullary nail fixation of right tibia fracture.  SURGEON:  Vonna Kotyk. Durward Fortes, M.D.  ASSISTANT:  Biagio Borg, PAC.  ANESTHESIA:  General.  COMPLICATIONS:  None.  DESCRIPTION OF PROCEDURE:  Mr. Moyers was met with his family in the holding area, identified the right tibia as the appropriate operative site.  He was in a posterior splint.  The dressing was removed.  He did have some stable fracture blisters with good capillary refill to his toes.  The right lower extremity was appropriately identified and marked.  The patient was then transported to room #8, transferred to the operating table and placed under general anesthesia without difficulty.  The tourniquet was then applied to the right thigh.  The right lower extremity was then prepped with chlorhexidine scrub and DuraPrep x2 from the tourniquet to the tips of the toes.  Sterile draping was performed. Time-out was called again.  The extremity was elevated and Esmarch exsanguinated with a proximal tourniquet at 350 mmHg.  A longitudinal incision was outlined along the medial border of the patellar tendon via sharp dissection, carried down to subcutaneous tissues.  The patellar tendon was identified.  It was minimally elevated.  Adipose tissue was removed along the proximal transverse area of the tibia and under image intensification, we found an appropriate entry site for the intramedullary nail.  The  DePuy/small Biomet nail system was utilized.  We then inserted the guidepin using the curved awl.  The guidepin was inserted approximately 3-4 inches and checked to be sure we were in excellent position with both AP and lateral projections.  An 8 mm drill hole was then made and the ball-tipped guidepin was then inserted through the proximal tibia.  Fracture was located at the junction of the middle and distal thirds.  The fracture was reduced with distal traction and the guidepin then placed across the fracture site into the supramalleolar region of the distal tibia.  We checked to be sure we were within the tibia in both AP and lateral projections.  There was no further displacement of the comminuted segments.  At that point, reaming was performed sequentially from an 8 to a 10 mm. We did have some chatter across the fracture site with a 10.  We elected to use a 9 mm nail.  After appropriate reaming, the VersaNail was then applied to the external guide and carefully placed through the entrance hole across the fracture site with distal traction, felt we had excellent reduction of the fracture.  The nail was carefully introduced further distally in the supramalleolar region and the guidepin was removed.  Image intensification revealed no further displacement of the comminuted segments.  There was one fracture that did  extend into the joint and was nondisplaced.  We elected to secure the nail proximally with an oblique and in medial to lateral screw and 1 screw distally transverse medial to lateral. Using the external guide proximally, we had the appropriate drill holes and with image intensification inserted the 4.5 mm screws distally.  We used the image intensification to insert a single transverse screw in the distal hole, which was well distal to the fracture blisters.  At that point, we performed further image intensification of the entire fracture, thought we had excellent position  of the fracture and the internal fixation device.  The wounds were then irrigated with saline solution.  Proximally, I closed the fascia with 0 and then 2-0 Vicryl.  The skin was closed with skin clips.  The small puncture sites of the nails were closed with skin clips.  We applied triple antibiotic ointment over the fracture blisters which had been decompressed and then a soft bulky dressing.  Tourniquet was deflated at 74 minutes with immediate capillary refill to the toes.  The patient tolerated the procedure without complications.  He was returned to the postanesthesia recovery room without problem.     Vonna Kotyk. Durward Fortes, M.D.     PWW/MEDQ  D:  04/07/2016  T:  04/07/2016  Job:  [id number]

## 2016-04-07 NOTE — Progress Notes (Signed)
Orthopedic Tech Progress Note Patient Details:  Clifford Jones [birth year] [id number]  Ortho Devices Ortho Device/Splint Location: trapeze bar patient helper Ortho Device/Splint Interventions: Application   Hildred Priest 04/07/2016, 12:39 PM

## 2016-04-07 NOTE — Anesthesia Procedure Notes (Signed)
Procedure Name: Intubation Date/Time: 04/07/2016 7:46 AM Performed by: MEYERS, BRANDON Pre-anesthesia Checklist: Patient identified, Emergency Drugs available, Suction available, Timeout performed and Patient being monitored Patient Re-evaluated:Patient Re-evaluated prior to inductionOxygen Delivery Method: Circle system utilized Preoxygenation: Pre-oxygenation with 100% oxygen Intubation Type: IV induction Ventilation: Mask ventilation without difficulty Laryngoscope Size: Miller and 2 Grade View: Grade II Tube type: Oral Tube size: 7.0 mm Number of attempts: 1 Airway Equipment and Method: Stylet and LTA kit utilized Placement Confirmation: ETT inserted through vocal cords under direct vision,  positive ETCO2 and breath sounds checked- equal and bilateral Secured at: 22 cm Tube secured with: Tape Dental Injury: Teeth and Oropharynx as per pre-operative assessment        

## 2016-04-07 NOTE — Op Note (Signed)
PATIENT ID:      Clifford Jones  MRN:     [id number] DOB/AGE:    24-Jan-1951 / 65 y.o.       OPERATIVE REPORT    DATE OF PROCEDURE:  04/07/2016       PREOPERATIVE DIAGNOSISclosed, comminuted, displaced:   right tibia fracture                                                       Estimated body mass index is 27.12 kg/m as calculated from the following:   Height as of this encounter: 6' (1.829 m).   Weight as of this encounter: 200 lb (90.7 kg).     POSTOPERATIVE DIAGNOSIS:   right tibia fracture-same                                                                     Estimated body mass index is 27.12 kg/m as calculated from the following:   Height as of this encounter: 6' (1.829 m).   Weight as of this encounter: 200 lb (90.7 kg).     PROCEDURE:  Procedure(s):CLOSED REDUCTION, INTERNAL FIXATION OF RIGHT TIBIA FRACTURE      SURGEON:  Joni Fears, MD    ASSISTANT:   Biagio Borg, PA-C   (Present and scrubbed throughout the case, critical for assistance with exposure, retraction, instrumentation, and closure.)          ANESTHESIA: general     DRAINS: none :      TOURNIQUET TIME:  Total Tourniquet Time Documented: Thigh (Right) - 74 minutes Total: Thigh (Right) - 74 minutes     COMPLICATIONS:  None   CONDITION:  stable  PROCEDURE IN DETAIL: [id number]   [place] 04/07/2016, 9:28 AM

## 2016-04-07 NOTE — Anesthesia Postprocedure Evaluation (Signed)
Anesthesia Post Note  Patient: TRAVEN GARONE  Procedure(s) Performed: Procedure(s) (LRB): FIXATION OF RIGHT TIBIA FRACTURE (Right)  Patient location during evaluation: PACU Anesthesia Type: General Level of consciousness: awake and alert Pain management: pain level controlled Vital Signs Assessment: post-procedure vital signs reviewed and stable Respiratory status: spontaneous breathing, nonlabored ventilation, respiratory function stable and patient connected to nasal cannula oxygen Cardiovascular status: blood pressure returned to baseline and stable Postop Assessment: no signs of nausea or vomiting Anesthetic complications: no    Last Vitals:  Vitals:   04/07/16 1100 04/07/16 1117  BP:  140/62  Pulse: 62 62  Resp: 18 18  Temp: 36.3 C 36.6 C    Last Pain:  Vitals:   04/07/16 1117  TempSrc: Oral  PainSc:                  Zenaida Deed

## 2016-04-07 NOTE — Anesthesia Preprocedure Evaluation (Signed)
Anesthesia Evaluation  Patient identified by MRN, date of birth, ID band Patient awake    Reviewed: Allergy & Precautions, H&P , NPO status , Patient's Chart, lab work & pertinent test results  History of Anesthesia Complications Negative for: history of anesthetic complications  Airway Mallampati: II  TM Distance: >3 FB Neck ROM: full    Dental no notable dental hx.    Pulmonary neg pulmonary ROS, Current Smoker,    Pulmonary exam normal breath sounds clear to auscultation       Cardiovascular hypertension, + CAD, + Past MI, + Cardiac Stents, + CABG and + Peripheral Vascular Disease  Normal cardiovascular exam Rhythm:regular Rate:Normal     Neuro/Psych negative neurological ROS     GI/Hepatic negative GI ROS, Neg liver ROS,   Endo/Other  negative endocrine ROS  Renal/GU negative Renal ROS     Musculoskeletal   Abdominal   Peds  Hematology negative hematology ROS (+)   Anesthesia Other Findings   Reproductive/Obstetrics negative OB ROS                             Anesthesia Physical Anesthesia Plan  ASA: III  Anesthesia Plan: General   Post-op Pain Management:    Induction: Intravenous  Airway Management Planned: Oral ETT  Additional Equipment:   Intra-op Plan:   Post-operative Plan:   Informed Consent: I have reviewed the patients History and Physical, chart, labs and discussed the procedure including the risks, benefits and alternatives for the proposed anesthesia with the patient or authorized representative who has indicated his/her understanding and acceptance.   Dental Advisory Given  Plan Discussed with: Anesthesiologist, CRNA and Surgeon  Anesthesia Plan Comments: (Will consider regional nerve block)        Anesthesia Quick Evaluation

## 2016-04-07 NOTE — Progress Notes (Signed)
The recent History & Physical has been reviewed. I have personally examined the patient today. There is no interval change to the documented History & Physical. The patient would like to proceed with the procedure.  Garald Balding 04/07/2016,  7:30 AM

## 2016-04-08 DIAGNOSIS — I252 Old myocardial infarction: Secondary | ICD-10-CM | POA: Diagnosis not present

## 2016-04-08 DIAGNOSIS — Z7982 Long term (current) use of aspirin: Secondary | ICD-10-CM | POA: Diagnosis not present

## 2016-04-08 DIAGNOSIS — I10 Essential (primary) hypertension: Secondary | ICD-10-CM | POA: Diagnosis not present

## 2016-04-08 DIAGNOSIS — E785 Hyperlipidemia, unspecified: Secondary | ICD-10-CM | POA: Diagnosis not present

## 2016-04-08 DIAGNOSIS — I739 Peripheral vascular disease, unspecified: Secondary | ICD-10-CM | POA: Diagnosis not present

## 2016-04-08 DIAGNOSIS — I2510 Atherosclerotic heart disease of native coronary artery without angina pectoris: Secondary | ICD-10-CM | POA: Diagnosis not present

## 2016-04-08 DIAGNOSIS — F17210 Nicotine dependence, cigarettes, uncomplicated: Secondary | ICD-10-CM | POA: Diagnosis not present

## 2016-04-08 DIAGNOSIS — K219 Gastro-esophageal reflux disease without esophagitis: Secondary | ICD-10-CM | POA: Diagnosis not present

## 2016-04-08 DIAGNOSIS — S82839A Other fracture of upper and lower end of unspecified fibula, initial encounter for closed fracture: Secondary | ICD-10-CM | POA: Diagnosis present

## 2016-04-08 DIAGNOSIS — S82251A Displaced comminuted fracture of shaft of right tibia, initial encounter for closed fracture: Secondary | ICD-10-CM | POA: Diagnosis not present

## 2016-04-08 LAB — COMPREHENSIVE METABOLIC PANEL
ALT: 9 U/L — AB (ref 17–63)
AST: 13 U/L — ABNORMAL LOW (ref 15–41)
Albumin: 3.1 g/dL — ABNORMAL LOW (ref 3.5–5.0)
Alkaline Phosphatase: 51 U/L (ref 38–126)
Anion gap: 8 (ref 5–15)
BILIRUBIN TOTAL: 0.6 mg/dL (ref 0.3–1.2)
BUN: 12 mg/dL (ref 6–20)
CALCIUM: 8.3 mg/dL — AB (ref 8.9–10.3)
CHLORIDE: 97 mmol/L — AB (ref 101–111)
CO2: 29 mmol/L (ref 22–32)
CREATININE: 0.8 mg/dL (ref 0.61–1.24)
Glucose, Bld: 134 mg/dL — ABNORMAL HIGH (ref 65–99)
Potassium: 4.2 mmol/L (ref 3.5–5.1)
Sodium: 134 mmol/L — ABNORMAL LOW (ref 135–145)
TOTAL PROTEIN: 5.9 g/dL — AB (ref 6.5–8.1)

## 2016-04-08 MED ORDER — OXYCODONE HCL 5 MG PO TABS: 5.0000 mg | ORAL_TABLET | ORAL | 0 refills | Status: DC | PRN

## 2016-04-08 NOTE — Discharge Summary (Signed)
Clifford Fears, MD   Clifford Borg, PA-C [address] [place], [place]  [address]                             [phone]  PATIENT ID: Clifford Jones        MRN:  [id number]          DOB/AGE: 63-15-1952 / 63 y.o.    DISCHARGE SUMMARY  ADMISSION DATE:    04/07/2016 DISCHARGE DATE:   04/08/2016   ADMISSION DIAGNOSIS: right tibia fracture    DISCHARGE DIAGNOSIS:  right tibia fracture    ADDITIONAL DIAGNOSIS: Principal Problem:   Closed displaced comminuted fracture of shaft of right tibia Active Problems:   Fracture of distal end of tibia  Past Medical History:  Diagnosis Date  . Closed fracture of right distal tibia   . Coronary artery disease    a. 1999 Inf MI;  b. 2003 CABGx6;  c. 2008 NSTEMI, occluded SVG to marginal and diagonal system. 2 DES placed to marginal system;  c. NSTEMI - 02/2012 Xience Xpedition DES to oS-OM and POBA to mid lesion;  c. 10/2013 NSTEMI/PCI: VG->OM 80 ISR, native OM 80/90 small, LIMA->LAD ok w/ 90 dLAD, D1 80, RIMA->RPL ok w/ 95 in RPL (2.5x14 Resolute DES), nl EF.  Marland Kitchen GERD (gastroesophageal reflux disease)   . Hyperlipidemia   . Hypertension   . Myocardial infarction   . S/P AAA repair   . Tobacco abuse     PROCEDURE: Procedure(s): FIXATION OF RIGHT TIBIA FRACTURE Right on 04/07/2016  CONSULTS: none    HISTORY: Clifford Jones is 65 years old and he is accompanied by a family member. He is here for evaluation of an injury to his right lower extremity. He notes that he had slipped on somecow manure 2 days ago near his home in [place]. He felthis bone "snap". He was taken directly to [facility] where x-rays were obtained. He was placed in a splint and given hydrocodone and asked to follow up with an orthopedist. He has been using 2 crutches, non-weightbearing. He is taking hydrocodone for pain. He has been relatively comfortable.   HOSPITAL COURSE:  Clifford Jones is a 65 y.o. admitted on 04/07/2016 and found to have a diagnosis  of right tibia fracture.  After appropriate laboratory studies were obtained  they were taken to the operating room on 04/07/2016 and underwent  Procedure(s): FIXATION OF RIGHT TIBIA FRACTURE  .   They were given perioperative antibiotics:  Anti-infectives    Start     Dose/Rate Route Frequency Ordered Stop   04/07/16 1400  ceFAZolin (ANCEF) IVPB 2g/100 mL premix     2 g 200 mL/hr over 30 Minutes Intravenous Every 6 hours 04/07/16 1134 04/08/16 0313   04/07/16 0700  ceFAZolin (ANCEF) IVPB 2g/100 mL premix     2 g 200 mL/hr over 30 Minutes Intravenous To [facility] 04/06/16 1343 04/07/16 0801    .  Tolerated the procedure well.       POD #1, allowed out of bed to a chair.  PT for ambulation and exercise program.  .  The remainder of the hospital course was dedicated to ambulation and strengthening.   The patient was discharged on 1 Day Post-Op in  Stable condition.  Blood products given:none  DIAGNOSTIC STUDIES: Recent vital signs: Patient Vitals for the past 24 hrs:  BP Temp Temp src Pulse Resp SpO2  04/08/16 0446 (!) 135/55  99.6 F (37.6 C) Oral 88 18 92 %  04/08/16 0100 (!) 120/57 99.6 F (37.6 C) Oral 69 18 90 %  04/07/16 2050 124/65 99.1 F (37.3 C) Oral 61 17 95 %  04/07/16 1117 140/62 97.8 F (36.6 C) Oral 62 18 90 %  04/07/16 1100 - 97.3 F (36.3 C) - 62 18 91 %  04/07/16 1040 - - - 60 15 94 %  04/07/16 1028 - - - 61 14 91 %  04/07/16 1015 - - - 63 13 91 %  04/07/16 0944 (!) 152/81 98.3 F (36.8 C) - - - -       Recent laboratory studies:  Recent Labs  04/07/16 0657  WBC 10.1  HGB 12.1*  HCT 37.0*  PLT 240    Recent Labs  04/07/16 0657 04/08/16 0621  NA 139 134*  K 4.3 4.2  CL 102 97*  CO2 30 29  BUN 13 12  CREATININE 0.80 0.80  GLUCOSE 120* 134*  CALCIUM 9.0 8.3*   Lab Results  Component Value Date   INR 1.02 04/07/2016   INR 1.04 03/02/2015   INR 1.01 03/01/2015     Recent Radiographic Studies :  Dg Chest 2 View  Result Date:  04/07/2016 CLINICAL DATA:  Right tibial/fibular fracture EXAM: CHEST  2 VIEW COMPARISON:  08/18/2015 FINDINGS: Cardiac shadow is within normal limits. Postsurgical changes are again seen. No focal infiltrate or sizable effusion is noted. No acute bony abnormality is seen. IMPRESSION: No active cardiopulmonary disease. Electronically Signed   By: Inez Catalina M.D.   On: 04/07/2016 08:44   Dg Tibia/fibula Right  Result Date: 04/07/2016 CLINICAL DATA:  FIXATION OF RIGHT TIBIA FRACTURE ( right ) Radiation Safety time out done by JBM Fluoro time 3 minute 30 seconds EXAM: RIGHT TIBIA AND FIBULA - 2 VIEW; DG C-ARM 61-120 MIN COMPARISON:  04/02/2016 FINDINGS: Ten images are submitted, demonstrating proximal and distal aspects of intra medullary nail traversing a fracture of the right tibia. Proximal and distal cortical screws transfix the intra medullary nail. Fibular fracture is also noted. Vascular clips are identified in medial aspect of the lower leg. IMPRESSION: ORIF of the right tibia. Electronically Signed   By: Nolon Nations M.D.   On: 04/07/2016 09:49   Dg C-arm 1-60 Min  Result Date: 04/07/2016 CLINICAL DATA:  FIXATION OF RIGHT TIBIA FRACTURE ( right ) Radiation Safety time out done by JBM Fluoro time 3 minute 30 seconds EXAM: RIGHT TIBIA AND FIBULA - 2 VIEW; DG C-ARM 61-120 MIN COMPARISON:  04/02/2016 FINDINGS: Ten images are submitted, demonstrating proximal and distal aspects of intra medullary nail traversing a fracture of the right tibia. Proximal and distal cortical screws transfix the intra medullary nail. Fibular fracture is also noted. Vascular clips are identified in medial aspect of the lower leg. IMPRESSION: ORIF of the right tibia. Electronically Signed   By: Nolon Nations M.D.   On: 04/07/2016 09:49    DISCHARGE INSTRUCTIONS: Discharge Instructions    Call MD / Call 911    Complete by:  As directed    If you experience chest pain or shortness of breath, CALL 911 and be  transported to the hospital emergency room.  If you develop a fever above 101 F, pus (white drainage) or increased drainage or redness at the wound, or calf pain, call your surgeon's office.   Change dressing    Complete by:  As directed    DO NOT REMOVE YOUR DRESSINGS OVER THE  WOUNDS   Constipation Prevention    Complete by:  As directed    Drink plenty of fluids.  Prune juice and/or coffee may be helpful.  You may use a stool softener, such as Colace (over the counter) 100 mg twice a day.  Use MiraLax (over the counter) for constipation as needed but this may take several days to work.  Mag Citrate --OR-- Milk of Magnesia --OR -- Dulcolax pills/suppositories may also be used but follow directions on the label.   Diet - low sodium heart healthy    Complete by:  As directed    Discharge instructions    Complete by:  As directed    YOU WERE GIVEN A DEVICE CALLED AN INCENTIVE SPIROMETER TO HELP YOU TAKE DEEP BREATHS.  PLEASE USE THIS AT LEAST TEN (10) TIMES EVERY 1-2 HOURS EVERY DAY TO PREVENT PNEUMONIA.   Driving restrictions    Complete by:  As directed    No driving for [id number] weeks   Non weight bearing    Complete by:  As directed    Patient may shower    Complete by:  As directed    You may shower BY PLACING SARAN [place].  DO NOT REMOVE DRESSINGS.   TED hose    Complete by:  As directed    Use stockings (TED hose) for 2 weeks on LEFT leg.  You may remove them at night for sleeping.      DISCHARGE MEDICATIONS:     Medication List    STOP taking these medications   HYDROcodone-acetaminophen 5-325 MG tablet Commonly known as:  NORCO/VICODIN     TAKE these medications   acetaminophen 325 MG tablet Commonly known as:  TYLENOL Take 2 tablets (650 mg total) by mouth every 4 (four) hours as needed for headache or mild pain.   amLODipine 5 MG tablet Commonly known as:  NORVASC TAKE 1 TABLET BY MOUTH EVERY DAY What changed:  Another  medication with the same name was removed. Continue taking this medication, and follow the directions you see here.   aspirin 81 MG EC tablet Take 1 tablet (81 mg total) by mouth daily.   carvedilol 6.25 MG tablet Commonly known as:  COREG TAKE 1 TABLET BY MOUTH TWICE DAILY WITH A MEAL.   lisinopril 20 MG tablet Commonly known as:  PRINIVIL,ZESTRIL TAKE 1 TABLET BY MOUTH EVERY DAY   nitroGLYCERIN 0.4 MG SL tablet Commonly known as:  NITROSTAT Place 1 tablet (0.4 mg total) under the tongue every 5 (five) minutes x 3 doses as needed for chest pain.   oxyCODONE 5 MG immediate release tablet Commonly known as:  Oxy IR/ROXICODONE Take 1-2 tablets (5-10 mg total) by mouth every 4 (four) hours as needed for moderate pain, severe pain or breakthrough pain.   pantoprazole 40 MG tablet Commonly known as:  PROTONIX TAKE 1 TABLET BY MOUTH TWICE DAILY   RANEXA 1000 MG SR tablet Generic drug:  ranolazine TAKE 1 TABLET BY MOUTH TWICE DAILY   rosuvastatin 40 MG tablet Commonly known as:  CRESTOR TAKE 1 TABLET BY MOUTH DAILY AT 6 PM.       FOLLOW UP VISIT:   Follow-up Information    Darek Eifler, PA-C. Schedule an appointment as soon as possible for a visit on 04/13/2016.   Specialty:  Orthopedic Surgery Contact information: [place] [address] [phone]           DISPOSITION:  Home  CONDITION:  Stable   Mike Craze. [place], [place] [phone]  04/08/2016 8:02 AM

## 2016-04-08 NOTE — Care Management Obs Status (Signed)
MEDICARE OBSERVATION STATUS NOTIFICATION   Patient Details  Name: Clifford Jones MRN: [id number] Date of Birth: 12-Nov-1950   Medicare Observation Status Notification Given:  Yes    Ninfa Meeker, RN 04/08/2016, 11:25 AM

## 2016-04-08 NOTE — Progress Notes (Signed)
PATIENT ID: RHYSON TRAUGHBER        MRN:  [id number]          DOB/AGE: 09-18-1950 / 65 y.o.    Joni Fears, MD   Biagio Borg, PA-C [address] [place], [place]  [address]                             [phone]   PROGRESS NOTE  Subjective:  negative for Chest Pain  negative for Shortness of Breath  negative for Nausea/Vomiting   negative for Calf Pain    Tolerating Diet: yes         Patient reports pain as mild.     Comfortable without complaints  Objective: Vital signs in last 24 hours:   Patient Vitals for the past 24 hrs:  BP Temp Temp src Pulse Resp SpO2  04/08/16 0446 (!) 135/55 99.6 F (37.6 C) Oral 88 18 92 %  04/08/16 0100 (!) 120/57 99.6 F (37.6 C) Oral 69 18 90 %  04/07/16 2050 124/65 99.1 F (37.3 C) Oral 61 17 95 %  04/07/16 1117 140/62 97.8 F (36.6 C) Oral 62 18 90 %  04/07/16 1100 - 97.3 F (36.3 C) - 62 18 91 %  04/07/16 1040 - - - 60 15 94 %  04/07/16 1028 - - - 61 14 91 %  04/07/16 1015 - - - 63 13 91 %  04/07/16 0944 (!) 152/81 98.3 F (36.8 C) - - - -      Intake/Output from previous day:   10/19 0701 - 10/20 0700 In: 1680 [P.O.:80; I.V.:1600] Out: 990 [Urine:960]   Intake/Output this shift:   No intake/output data recorded.   Intake/Output      10/19 0701 - 10/20 0700 10/20 0701 - 10/21 0700   P.O. 80    I.V. (mL/kg) 1600 (17.6)    Total Intake(mL/kg) 1680 (18.5)    Urine (mL/kg/hr) 960 (0.4)    Blood 30 (0)    Total Output 990     Net +690             LABORATORY DATA:  Recent Labs  04/07/16 0657  WBC 10.1  HGB 12.1*  HCT 37.0*  PLT 240    Recent Labs  04/07/16 0657 04/08/16 0621  NA 139 134*  K 4.3 4.2  CL 102 97*  CO2 30 29  BUN 13 12  CREATININE 0.80 0.80  GLUCOSE 120* 134*  CALCIUM 9.0 8.3*   Lab Results  Component Value Date   INR 1.02 04/07/2016   INR 1.04 03/02/2015   INR 1.01 03/01/2015    Recent Radiographic Studies :  Dg Chest 2 View  Result Date: 04/07/2016 CLINICAL DATA:  Right  tibial/fibular fracture EXAM: CHEST  2 VIEW COMPARISON:  08/18/2015 FINDINGS: Cardiac shadow is within normal limits. Postsurgical changes are again seen. No focal infiltrate or sizable effusion is noted. No acute bony abnormality is seen. IMPRESSION: No active cardiopulmonary disease. Electronically Signed   By: Inez Catalina M.D.   On: 04/07/2016 08:44   Dg Tibia/fibula Right  Result Date: 04/07/2016 CLINICAL DATA:  FIXATION OF RIGHT TIBIA FRACTURE ( right ) Radiation Safety time out done by JBM Fluoro time 3 minute 30 seconds EXAM: RIGHT TIBIA AND FIBULA - 2 VIEW; DG C-ARM 61-120 MIN COMPARISON:  04/02/2016 FINDINGS: Ten images are submitted, demonstrating proximal and distal aspects of intra medullary nail traversing a fracture of the  right tibia. Proximal and distal cortical screws transfix the intra medullary nail. Fibular fracture is also noted. Vascular clips are identified in medial aspect of the lower leg. IMPRESSION: ORIF of the right tibia. Electronically Signed   By: Nolon Nations M.D.   On: 04/07/2016 09:49   Dg C-arm 1-60 Min  Result Date: 04/07/2016 CLINICAL DATA:  FIXATION OF RIGHT TIBIA FRACTURE ( right ) Radiation Safety time out done by JBM Fluoro time 3 minute 30 seconds EXAM: RIGHT TIBIA AND FIBULA - 2 VIEW; DG C-ARM 61-120 MIN COMPARISON:  04/02/2016 FINDINGS: Ten images are submitted, demonstrating proximal and distal aspects of intra medullary nail traversing a fracture of the right tibia. Proximal and distal cortical screws transfix the intra medullary nail. Fibular fracture is also noted. Vascular clips are identified in medial aspect of the lower leg. IMPRESSION: ORIF of the right tibia. Electronically Signed   By: Nolon Nations M.D.   On: 04/07/2016 09:49     Examination:  General appearance: alert, cooperative and no distress  Wound Exam: clean, dry, intact -some drainage on dressings overnight, but presently dry  Drainage:  Scant/small amount Serosanguinous  exudate  Motor Exam: EHL, FHL, Anterior Tibial and Posterior Tibial Intact  Sensory Exam: Superficial Peroneal, Deep Peroneal and Tibial normal  Vascular Exam: Normal-right foot warm with good cap refill  Assessment:    1 Day Post-Op  Procedure(s) (LRB): FIXATION OF RIGHT TIBIA FRACTURE (Right)  ADDITIONAL DIAGNOSIS:  Principal Problem:   Closed displaced comminuted fracture of shaft of right tibia Active Problems:   Fracture of distal end of tibia  no new problems   Plan: Physical Therapy as ordered Touch Down Weight Bearing (TDWB)  DVT Prophylaxis:  Aspirin,ranexa as pre op  DISCHARGE PLAN: Home  DISCHARGE NEEDS: HHPT    Dressing changed-wounds fine, compartments supple. Will plan to D/C today after PT with F/U next week    Garald Balding  04/08/2016 7:50 AM

## 2016-04-08 NOTE — Evaluation (Signed)
Physical Therapy Evaluation Patient Details Name: Clifford Jones MRN: [id number] DOB: [birth year] Today's Date: 04/08/2016   History of Present Illness  Pt is a 65 y.o. male now s/p IM nail of Rt tibia fx. PMH: MI, HTN, CAD, AAA repair, CABG.   Clinical Impression  Pt able to ambulate 50 ft with rw and min guard assistance. Pattern is slow but consistent with NWB status. Pt states that he will be staying with friends upon D/C. Recommending rw for ambulation, pt refusing HHPT services. PT to follow acutely to maximize independence and mobility. Anticipate D/C to home with friends to assist once released.      Follow Up Recommendations Supervision for mobility/OOB (Pt refusing HHPT services)    Equipment Recommendations  Rolling walker with 5" wheels    Recommendations for Other Services       Precautions / Restrictions Precautions Precautions: Fall Restrictions Weight Bearing Restrictions: Yes RLE Weight Bearing: Non weight bearing      Mobility  Bed Mobility Overal bed mobility: Needs Assistance Bed Mobility: Supine to Sit     Supine to sit: Supervision     General bed mobility comments: supervision for safety  Transfers Overall transfer level: Needs assistance Equipment used: Rolling walker (2 wheeled) Transfers: Sit to/from Stand Sit to Stand: Supervision         General transfer comment: supervision for safety, cues for hand placement. Instructed pt to fully turn prior to sitting.   Ambulation/Gait Ambulation/Gait assistance: Min guard Ambulation Distance (Feet): 50 Feet Assistive device: Rolling walker (2 wheeled) Gait Pattern/deviations:  (swing-to pattern) Gait velocity: slow pattern   General Gait Details: Pt consistent with NWB status. Ambulation is slow and pt taking standing rest breaks as needed.   Stairs Stairs:  (Pt declines.)          Wheelchair Mobility    Modified Rankin (Stroke Patients Only)       Balance Overall balance  assessment: Needs assistance Sitting-balance support: No upper extremity supported Sitting balance-Leahy Scale: Good     Standing balance support: Bilateral upper extremity supported Standing balance-Leahy Scale: Poor Standing balance comment: using rw                             Pertinent Vitals/Pain Pain Assessment: Faces Faces Pain Scale: Hurts a little bit Pain Location: Rt LE Pain Descriptors / Indicators: Guarding Pain Intervention(s): Limited activity within patient's tolerance;Monitored during session    Home Living Family/patient expects to be discharged to:: Private residence Living Arrangements: Alone Available Help at Discharge: Friend(s);Available 24 hours/day Type of Home: House Home Access: Level entry       Home Equipment: Crutches Additional Comments: Pt staying with friends, confirms that they do not have any stairs to enter or inside their home.     Prior Function Level of Independence: Independent               Hand Dominance        Extremity/Trunk Assessment   Upper Extremity Assessment: Overall WFL for tasks assessed           Lower Extremity Assessment: RLE deficits/detail RLE Deficits / Details: unable to perform SLR       Communication   Communication: No difficulties  Cognition Arousal/Alertness: Awake/alert Behavior During Therapy: WFL for tasks assessed/performed Overall Cognitive Status: Within Functional Limits for tasks assessed  General Comments      Exercises     Assessment/Plan    PT Assessment Patient needs continued PT services  PT Problem List Decreased strength;Decreased range of motion;Decreased activity tolerance;Decreased balance;Decreased mobility;Decreased safety awareness          PT Treatment Interventions      PT Goals (Current goals can be found in the Care Plan section)  Acute Rehab PT Goals Patient Stated Goal: get back to walking PT Goal  Formulation: With patient Time For Goal Achievement: 04/22/16 Potential to Achieve Goals: Good    Frequency Min 5X/week   Barriers to discharge        Co-evaluation               End of Session Equipment Utilized During Treatment: Gait belt Activity Tolerance: Patient tolerated treatment well;Patient limited by fatigue Patient left: in chair;with call bell/phone within reach Nurse Communication: Mobility status    Functional Assessment Tool Used: clinical judgment Functional Limitation: Mobility: Walking and moving around Mobility: Walking and Moving Around Current Status [id number]): At least 20 percent but less than 40 percent impaired, limited or restricted Mobility: Walking and Moving Around Goal Status [phone]): At least 1 percent but less than 20 percent impaired, limited or restricted    Time: 0832-0906 PT Time Calculation (min) (ACUTE ONLY): 34 min   Charges:   PT Evaluation $PT Eval Moderate Complexity: 1 Procedure PT Treatments $Gait Training: 8-22 mins   PT G Codes:   PT G-Codes **NOT FOR INPATIENT CLASS** Functional Assessment Tool Used: clinical judgment Functional Limitation: Mobility: Walking and moving around Mobility: Walking and Moving Around Current Status [id number]): At least 20 percent but less than 40 percent impaired, limited or restricted Mobility: Walking and Moving Around Goal Status [phone]): At least 1 percent but less than 20 percent impaired, limited or restricted    Cassell Clement, PT, CSCS Pager [phone] Office [phone]  04/08/2016, 9:19 AM

## 2016-04-08 NOTE — Care Management Note (Signed)
Case Management Note  Patient Details  Name: Clifford Jones MRN: [id number] Date of Birth: [birth year]  Subjective/Objective:    65 yr old gentleman s/p IM Nailing of right tibia fracture.               Action/Plan: Patient refuses Home Health therapy, CM has ordered rolling walker.   Expected Discharge Date:   04/08/16               Expected Discharge Plan:  [place]  In-House Referral:     Discharge planning Services  CM Consult  Post Acute Care Choice:  Durable Medical Equipment, Home Health Choice offered to:  Patient  DME Arranged:  Walker rolling DME Agency:  [facility] Arranged:  Patient Refused [facility] Agency:     Status of Service:  Completed, signed off  If discussed at [place] of Stay Meetings, dates discussed:    Additional Comments:  Ninfa Meeker, RN 04/08/2016, 11:27 AM

## 2016-04-08 NOTE — Care Management CC44 (Signed)
Condition Code 44 Documentation Completed  Patient Details  Name: LONNEY SISLEY MRN: [id number] Date of Birth: January 11, 1951   Condition Code 44 given:  Yes Patient signature on Condition Code 44 notice:  Yes Documentation of 2 MD's agreement:  Yes Code 44 added to claim:  Yes    Ninfa Meeker, RN 04/08/2016, 11:25 AM

## 2016-04-08 NOTE — Care Management (Signed)
CM has ordered rolling walker for patient,he refuses Home Health .

## 2016-04-12 ENCOUNTER — Inpatient Hospital Stay (INDEPENDENT_AMBULATORY_CARE_PROVIDER_SITE_OTHER): Payer: Self-pay | Admitting: Orthopedic Surgery

## 2016-04-12 ENCOUNTER — Ambulatory Visit (INDEPENDENT_AMBULATORY_CARE_PROVIDER_SITE_OTHER): Payer: Commercial Managed Care - HMO | Admitting: Orthopedic Surgery

## 2016-04-12 ENCOUNTER — Ambulatory Visit (INDEPENDENT_AMBULATORY_CARE_PROVIDER_SITE_OTHER): Payer: Commercial Managed Care - HMO

## 2016-04-12 ENCOUNTER — Encounter (INDEPENDENT_AMBULATORY_CARE_PROVIDER_SITE_OTHER): Payer: Self-pay | Admitting: Orthopedic Surgery

## 2016-04-12 VITALS — BP 128/78 | HR 70 | Resp 14 | Ht 72.0 in | Wt 205.0 lb

## 2016-04-12 DIAGNOSIS — S82201D Unspecified fracture of shaft of right tibia, subsequent encounter for closed fracture with routine healing: Secondary | ICD-10-CM

## 2016-04-12 DIAGNOSIS — S82401D Unspecified fracture of shaft of right fibula, subsequent encounter for closed fracture with routine healing: Secondary | ICD-10-CM | POA: Diagnosis not present

## 2016-04-12 NOTE — Progress Notes (Signed)
Office Visit Note   Patient: Clifford Jones           Date of Birth: 03/08/1951           MRN: [id number] Visit Date: 04/12/2016              Requested by: Rory Percy, MD [place], [place] [id number] PCP: Rory Percy, MD   Assessment & Plan: Visit Diagnoses:  1. Closed fracture of right tibia and fibula with routine healing, subsequent encounter     Plan: #1: Redressing wounds today with Xeroform right tibia  #2: Follow back up in 1 week for recheck evaluation  #3: Call in the interim if he has problems  #4: Equalizer boot nonweightbearing  Follow-Up Instructions: Follow-up in the evenings office next Wednesday  Orders:  Orders Placed This Encounter  Procedures  . For home use only DME 4 wheeled rolling walker with seat  . XR Tibia/Fibula Right  . Post op boot   No orders of the defined types were placed in this encounter.     Procedures: No procedures performed   Clinical Data: No additional findings.   Subjective: Chief Complaint  Patient presents with  . Right Lower Leg - Routine Post Op, Wound Check, Edema, Fracture  5 days status post intramedullary nailing of the right tibia  HPI  Clifford Jones returns today for follow-up of his IM nailing of his right tibia now 5 days postop. He is actually doing very well. He is using only minimal pain medicines. Spar Denies calf pain at this time.  Review of Systems Unchanged from last visit.  Objective: Vital Signs: BP 128/78   Pulse 70   Resp 14   Ht 6' (1.829 m)   Wt 205 lb (93 kg)   BMI 27.80 kg/m   Physical Exam  Constitutional: He is oriented to person, place, and time. He appears well-developed and well-nourished.  HENT:  Head: Normocephalic and atraumatic.  Eyes: EOM are normal.  Pulmonary/Chest: Effort normal.  Neurological: He is alert and oriented to person, place, and time.  Skin: Skin is warm and dry.  Psychiatric: He has a normal mood and affect. His behavior is normal. Judgment  and thought content normal.    Ortho Exam  Wounds are healing per primam with no signs of infection. The areas of this are clean and infection. EHL and FHL and anterior posterior tibs are intact. Calf is firm but soft. No pain to palpation over the calf. Neurovascularly intact distally.  Specialty Comments:  No specialty comments available.  Imaging: Xr Tibia/fibula Right  Result Date: 04/12/2016 4 view right tibia and fibula reveals maintenance of position alignment of the fracture fragments. Acceptable alignment of the fracture.    PMFS History: Patient Active Problem List   Diagnosis Date Noted  . Fracture of distal end of fibula 04/08/2016  . Closed displaced comminuted fracture of shaft of right tibia 04/07/2016  . Fracture of distal end of tibia 04/07/2016  . Chest pain   . Elevated troponin 03/02/2015  . NSTEMI (non-ST elevated myocardial infarction) ([place]) 03/08/2012  . Unstable angina ([place]) 03/04/2012  . CAD (coronary artery disease)   . Hypertension   . Personal history of noncompliance with medical treatment   . S/P AAA repair   . Peripheral vascular disease ([place])   . Tobacco abuse   . Hyperlipidemia   . CAD, bypass graft    Past Medical History:  Diagnosis Date  . Closed fracture  of right distal tibia   . Coronary artery disease    a. 1999 Inf MI;  b. 2003 CABGx6;  c. 2008 NSTEMI, occluded SVG to marginal and diagonal system. 2 DES placed to marginal system;  c. NSTEMI - 02/2012 Xience Xpedition DES to oS-OM and POBA to mid lesion;  c. 10/2013 NSTEMI/PCI: VG->OM 80 ISR, native OM 80/90 small, LIMA->LAD ok w/ 90 dLAD, D1 80, RIMA->RPL ok w/ 95 in RPL (2.5x14 Resolute DES), nl EF.  Marland Kitchen GERD (gastroesophageal reflux disease)   . Hyperlipidemia   . Hypertension   . Myocardial infarction   . S/P AAA repair   . Tobacco abuse     Family History  Problem Relation Age of Onset  . Coronary artery disease      Past Surgical History:  Procedure Laterality Date  .  ABDOMINAL AORTIC ANEURYSM REPAIR  2006  . APPENDECTOMY    . CORONARY ANGIOPLASTY WITH STENT PLACEMENT    . CORONARY ARTERY BYPASS GRAFT  2003  . HERNIA REPAIR    . LEFT HEART CATHETERIZATION WITH CORONARY ANGIOGRAM N/A 03/05/2012   Procedure: LEFT HEART CATHETERIZATION WITH CORONARY ANGIOGRAM;  Surgeon: Hillary Bow, MD;  Location: [facility] C[facility] LAB;  Service: Cardiovascular;  Laterality: N/A;  . LEFT HEART CATHETERIZATION WITH CORONARY ANGIOGRAM N/A 10/23/2013   Procedure: LEFT HEART CATHETERIZATION WITH CORONARY ANGIOGRAM;  Surgeon: Blane Ohara, MD;  Location: [facility] CATH LAB;  Service: Cardiovascular;  Laterality: N/A;  . LEFT HEART CATHETERIZATION WITH CORONARY/GRAFT ANGIOGRAM  10/22/2013   Procedure: LEFT HEART CATHETERIZATION WITH Beatrix Fetters;  Surgeon: Leonie Man, MD;  Location: [facility] CATH LAB;  Service: Cardiovascular;;  . PERCUTANEOUS CORONARY STENT INTERVENTION (PCI-S) N/A 03/07/2012   Procedure: PERCUTANEOUS CORONARY STENT INTERVENTION (PCI-S);  Surgeon: Sherren Mocha, MD;  Location: [facility] CATH LAB;  Service: Cardiovascular;  Laterality: N/A;  . PERCUTANEOUS CORONARY STENT INTERVENTION (PCI-S) Right 10/23/2013   Procedure: PERCUTANEOUS CORONARY STENT INTERVENTION (PCI-S);  Surgeon: Blane Ohara, MD;  Location: [facility] CATH LAB;  Service: Cardiovascular;  Laterality: Right;   Social History   Occupational History  . Not on file.   Social History Main Topics  . Smoking status: Current Every Day Smoker    Packs/day: 0.50    Years: 45.00    Types: Cigarettes    Start date: 10/04/1964  . Smokeless tobacco: Never Used  . Alcohol use 1.8 oz/week    3 Shots of liquor per week     Comment: occasional  . Drug use: No  . Sexual activity: Yes

## 2016-04-12 NOTE — OR Nursing (Signed)
LATE ENTRY:  Added missing implant to implant documentation.  Verified with Firefighter.

## 2016-04-13 ENCOUNTER — Encounter (HOSPITAL_COMMUNITY): Payer: Self-pay | Admitting: Orthopaedic Surgery

## 2016-04-13 NOTE — OR Nursing (Signed)
Changes to chart made due to incorrect documentation.

## 2016-04-20 ENCOUNTER — Encounter (INDEPENDENT_AMBULATORY_CARE_PROVIDER_SITE_OTHER): Payer: Self-pay | Admitting: Orthopedic Surgery

## 2016-04-20 ENCOUNTER — Ambulatory Visit (INDEPENDENT_AMBULATORY_CARE_PROVIDER_SITE_OTHER): Payer: Commercial Managed Care - HMO | Admitting: Orthopedic Surgery

## 2016-04-20 VITALS — BP 132/61 | HR 75 | Ht 72.0 in | Wt 205.0 lb

## 2016-04-20 DIAGNOSIS — S82251D Displaced comminuted fracture of shaft of right tibia, subsequent encounter for closed fracture with routine healing: Secondary | ICD-10-CM

## 2016-04-20 NOTE — Progress Notes (Signed)
Office Visit Note   Patient: Clifford Jones           Date of Birth: 1950-11-18           MRN: [id number] Visit Date: 04/20/2016              Requested by: Rory Percy, MD [place], [place] [address] PCP: Rory Percy, MD   Assessment & Plan: Visit Diagnoses:  1. Closed displaced comminuted fracture of shaft of right tibia with routine healing, subsequent encounter     Plan: At this time are really remove his staples. The wound. Equalizer boot. Nontender touchdown weightbearing only.  Follow-Up Instructions: Return in about 2 weeks (around 05/04/2016).   Orders:  No orders of the defined types were placed in this encounter.  No orders of the defined types were placed in this encounter.     Procedures: No procedures performed   Clinical Data: No additional findings.   Subjective: Chief Complaint  Patient presents with  . Left Leg - Routine Post Op, Wound Check    Pt has tib/fib FX, surgery on 04/07/2016. Some redness Little swelling Ulcers on lower Right leg are healing Pt putting minimal weight on CAM boot on R leg He is doing well overall. He states only minimal amount pain. Denies any neurovascular problems arise. Denies any calf pain.  Clifford Jones 65 year old white male who is seen today for follow-up of his IM nailing of his right distal third tibia fracture. He also had a proximal fibular fracture.  Review of Systems  Constitutional: Negative.   HENT: Negative.   Respiratory: Negative.   Cardiovascular: Negative.   Gastrointestinal: Negative.   Endocrine: Negative.   Genitourinary: Negative.   Musculoskeletal: Negative.   Allergic/Immunologic: Negative.   Neurological: Negative.   Hematological: Negative.   Psychiatric/Behavioral: Negative.      Objective: Vital Signs: BP 132/61   Pulse 75   Ht 6' (1.829 m)   Wt 205 lb (93 kg)   SpO2 (!) 14%   BMI 27.80 kg/m   Physical Exam  Constitutional: He is oriented to person, place, and  time. He appears well-developed and well-nourished.  Eyes: EOM are normal. Pupils are equal, round, and reactive to light.  Cardiovascular: Normal rate.   Pulmonary/Chest: Effort normal.  Neurological: He is alert and oriented to person, place, and time.  Skin: Skin is warm and dry.  Psychiatric: He has a normal mood and affect. His behavior is normal. Judgment and thought content normal.    Ortho Exam  his wounds are healing per primam with no signs of infection. Staples removed and Steri-Strips are placed. He has motion from full extension to about 100. He much pain at all with range of motion. Neurovascular intact distally.  Specialty Comments:  No specialty comments available.  Imaging: No results found.   PMFS History: Patient Active Problem List   Diagnosis Date Noted  . Fracture of distal end of fibula 04/08/2016  . Closed displaced comminuted fracture of shaft of right tibia 04/07/2016  . Fracture of distal end of tibia 04/07/2016  . Chest pain   . Elevated troponin 03/02/2015  . NSTEMI (non-ST elevated myocardial infarction) ([place]) 03/08/2012  . Unstable angina ([place]) 03/04/2012  . CAD (coronary artery disease)   . Hypertension   . Personal history of noncompliance with medical treatment   . S/P AAA repair   . Peripheral vascular disease ([place])   . Tobacco abuse   . Hyperlipidemia   .  CAD, bypass graft    Past Medical History:  Diagnosis Date  . Closed fracture of right distal tibia   . Coronary artery disease    a. 1999 Inf MI;  b. 2003 CABGx6;  c. 2008 NSTEMI, occluded SVG to marginal and diagonal system. 2 DES placed to marginal system;  c. NSTEMI - 02/2012 Xience Xpedition DES to oS-OM and POBA to mid lesion;  c. 10/2013 NSTEMI/PCI: VG->OM 80 ISR, native OM 80/90 small, LIMA->LAD ok w/ 90 dLAD, D1 80, RIMA->RPL ok w/ 95 in RPL (2.5x14 Resolute DES), nl EF.  Marland Kitchen GERD (gastroesophageal reflux disease)   . Hyperlipidemia   . Hypertension   . Myocardial infarction   .  S/P AAA repair   . Tobacco abuse     Family History  Problem Relation Age of Onset  . Coronary artery disease      Past Surgical History:  Procedure Laterality Date  . ABDOMINAL AORTIC ANEURYSM REPAIR  2006  . APPENDECTOMY    . CORONARY ANGIOPLASTY WITH STENT PLACEMENT    . CORONARY ARTERY BYPASS GRAFT  2003  . HERNIA REPAIR    . LEFT HEART CATHETERIZATION WITH CORONARY ANGIOGRAM N/A 03/05/2012   Procedure: LEFT HEART CATHETERIZATION WITH CORONARY ANGIOGRAM;  Surgeon: Hillary Bow, MD;  Location: [facility] CATH LAB;  Service: Cardiovascular;  Laterality: N/A;  . LEFT HEART CATHETERIZATION WITH CORONARY ANGIOGRAM N/A 10/23/2013   Procedure: LEFT HEART CATHETERIZATION WITH CORONARY ANGIOGRAM;  Surgeon: Blane Ohara, MD;  Location: [facility] CATH LAB;  Service: Cardiovascular;  Laterality: N/A;  . LEFT HEART CATHETERIZATION WITH CORONARY/GRAFT ANGIOGRAM  10/22/2013   Procedure: LEFT HEART CATHETERIZATION WITH Beatrix Fetters;  Surgeon: Leonie Man, MD;  Location: [facility] CATH LAB;  Service: Cardiovascular;;  . PERCUTANEOUS CORONARY STENT INTERVENTION (PCI-S) N/A 03/07/2012   Procedure: PERCUTANEOUS CORONARY STENT INTERVENTION (PCI-S);  Surgeon: Sherren Mocha, MD;  Location: [facility] CATH LAB;  Service: Cardiovascular;  Laterality: N/A;  . PERCUTANEOUS CORONARY STENT INTERVENTION (PCI-S) Right 10/23/2013   Procedure: PERCUTANEOUS CORONARY STENT INTERVENTION (PCI-S);  Surgeon: Blane Ohara, MD;  Location: [facility] CATH LAB;  Service: Cardiovascular;  Laterality: Right;  . TIBIA IM NAIL INSERTION Right 04/07/2016   Procedure: FIXATION OF RIGHT TIBIA FRACTURE;  Surgeon: Garald Balding, MD;  Location: [place];  Service: Orthopedics;  Laterality: Right;   Social History   Occupational History  . Not on file.   Social History Main Topics  . Smoking status: Current Every Day Smoker    Packs/day: 0.50    Years: 45.00    Types: Cigarettes    Start date: 10/04/1964  . Smokeless tobacco: Never Used  .  Alcohol use 1.8 oz/week    3 Shots of liquor per week     Comment: occasional  . Drug use: No  . Sexual activity: Yes

## 2016-05-04 ENCOUNTER — Ambulatory Visit (INDEPENDENT_AMBULATORY_CARE_PROVIDER_SITE_OTHER): Payer: Commercial Managed Care - HMO

## 2016-05-04 ENCOUNTER — Ambulatory Visit (INDEPENDENT_AMBULATORY_CARE_PROVIDER_SITE_OTHER): Payer: Commercial Managed Care - HMO | Admitting: Orthopaedic Surgery

## 2016-05-04 DIAGNOSIS — S82251D Displaced comminuted fracture of shaft of right tibia, subsequent encounter for closed fracture with routine healing: Secondary | ICD-10-CM | POA: Diagnosis not present

## 2016-05-04 NOTE — Progress Notes (Signed)
Pts leg looks really good and can now put pressure on foot with his boot.  Almost 1 month status post closed reduction and IM nailing of right tibia fracture. Clifford Jones is doing very well using the equalizer boot and crutches. He is not having any appreciable pain.  Exam reveals reveals the incisions to be well-healed. Vascular exam is intact.No Calf pain.  I will plan to see him back in 3 weeks during which time he'll continue with the equalizer boot and crutches with weightbearing to tolerance. Should not be driving a car.

## 2016-05-25 ENCOUNTER — Ambulatory Visit (INDEPENDENT_AMBULATORY_CARE_PROVIDER_SITE_OTHER): Payer: Self-pay

## 2016-05-25 ENCOUNTER — Ambulatory Visit (INDEPENDENT_AMBULATORY_CARE_PROVIDER_SITE_OTHER): Payer: Commercial Managed Care - HMO | Admitting: Orthopaedic Surgery

## 2016-05-25 DIAGNOSIS — S82201D Unspecified fracture of shaft of right tibia, subsequent encounter for closed fracture with routine healing: Secondary | ICD-10-CM

## 2016-05-25 DIAGNOSIS — S82401D Unspecified fracture of shaft of right fibula, subsequent encounter for closed fracture with routine healing: Secondary | ICD-10-CM

## 2016-05-25 NOTE — Progress Notes (Signed)
Mr. Easterwood is 7 weeks status post intramedullary nailing of a right displaced tibia fracture. He is using a Event organiser. He denies any significant pain. He is still smoking but "less". Denies numbness or tingling.  On examination there is some generalized non-erythematous induration of his right leg. Excellent range of motion of ankle. Neurovascular exam is intact. No open wounds on right leg. Minimal discomfort.   Films of his right leg demonstrate increased callus around the fracture with no loss of position he may have broken the distal fixation screw which at this point I don't think will be a problem unless it causes pain. We'll monitor this.  I will plan to see him back in 1 month. He is to continue with the equalizer boot and a cane with 50% weightbearing. He inquired about driving and I think he needs to feel confident behind the wheel and not use the boot

## 2016-05-30 ENCOUNTER — Ambulatory Visit: Payer: Self-pay | Admitting: Cardiovascular Disease

## 2016-06-11 DIAGNOSIS — J441 Chronic obstructive pulmonary disease with (acute) exacerbation: Secondary | ICD-10-CM | POA: Diagnosis not present

## 2016-06-11 DIAGNOSIS — I2510 Atherosclerotic heart disease of native coronary artery without angina pectoris: Secondary | ICD-10-CM | POA: Diagnosis not present

## 2016-06-11 DIAGNOSIS — Z79899 Other long term (current) drug therapy: Secondary | ICD-10-CM | POA: Diagnosis not present

## 2016-06-11 DIAGNOSIS — Z7982 Long term (current) use of aspirin: Secondary | ICD-10-CM | POA: Diagnosis not present

## 2016-06-11 DIAGNOSIS — I10 Essential (primary) hypertension: Secondary | ICD-10-CM | POA: Diagnosis not present

## 2016-06-11 DIAGNOSIS — J40 Bronchitis, not specified as acute or chronic: Secondary | ICD-10-CM | POA: Diagnosis not present

## 2016-06-11 DIAGNOSIS — Z951 Presence of aortocoronary bypass graft: Secondary | ICD-10-CM | POA: Diagnosis not present

## 2016-06-11 DIAGNOSIS — K219 Gastro-esophageal reflux disease without esophagitis: Secondary | ICD-10-CM | POA: Diagnosis not present

## 2016-06-11 DIAGNOSIS — E785 Hyperlipidemia, unspecified: Secondary | ICD-10-CM | POA: Diagnosis not present

## 2016-06-11 DIAGNOSIS — J440 Chronic obstructive pulmonary disease with acute lower respiratory infection: Secondary | ICD-10-CM | POA: Diagnosis not present

## 2016-06-11 DIAGNOSIS — R0602 Shortness of breath: Secondary | ICD-10-CM | POA: Diagnosis not present

## 2016-06-11 DIAGNOSIS — F17200 Nicotine dependence, unspecified, uncomplicated: Secondary | ICD-10-CM | POA: Diagnosis not present

## 2016-06-11 DIAGNOSIS — Z720 Tobacco use: Secondary | ICD-10-CM | POA: Diagnosis not present

## 2016-06-22 ENCOUNTER — Encounter (INDEPENDENT_AMBULATORY_CARE_PROVIDER_SITE_OTHER): Payer: Self-pay | Admitting: Orthopaedic Surgery

## 2016-06-22 ENCOUNTER — Ambulatory Visit (INDEPENDENT_AMBULATORY_CARE_PROVIDER_SITE_OTHER): Payer: Commercial Managed Care - HMO | Admitting: Orthopaedic Surgery

## 2016-06-22 DIAGNOSIS — S82402D Unspecified fracture of shaft of left fibula, subsequent encounter for closed fracture with routine healing: Secondary | ICD-10-CM

## 2016-06-22 DIAGNOSIS — S82202D Unspecified fracture of shaft of left tibia, subsequent encounter for closed fracture with routine healing: Secondary | ICD-10-CM

## 2016-06-22 NOTE — Progress Notes (Signed)
Office Visit Note   Patient: Clifford Jones           Date of Birth: 1951/04/18           MRN: [id number] Visit Date: 06/22/2016              Requested by: Rory Percy, MD [place], [place] [address] PCP: Rory Percy, MD   Assessment & Plan: Visit Diagnoses:  1. Closed fracture of left tibia and fibula with routine healing, subsequent encounter   Doing very well now approximately 11 weeks postoperative  Plan: From equalizer boot, into knee with cane and weight-bear as tolerated. Follow-up in 6 weeks for the last x-ray of left tibia and fibula.  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   Orders:  No orders of the defined types were placed in this encounter.  No orders of the defined types were placed in this encounter.   Wean from Manufacturing systems engineer. Continue with cane as needed.    Procedures: No procedures performed   Clinical Data: No additional findings.   Subjective: Chief Complaint  Patient presents with  . Right Leg - Routine Post Op    HPI No significant complaints 11 weeks postop and ambulating in equalizer boot and cane without problem Review of Systems   Objective: Vital Signs: There were no vitals taken for this visit.  Physical Exam  Ortho Exam left leg incisions healing without complication. Some decreased sensibility in left foot that predated his injury or surgery. Extension and 120 flexion left knee.No Localized areas of tenderness. Some persistent induration along the distal third of the tibia without pain.  Specialty Comments:  No specialty comments available.  Imaging: No results found.   PMFS History: Patient Active Problem List   Diagnosis Date Noted  . Fracture of distal end of fibula 04/08/2016  . Closed displaced comminuted fracture of shaft of right tibia 04/07/2016  . Fracture of distal end of tibia 04/07/2016  . Chest pain   . Elevated  troponin 03/02/2015  . NSTEMI (non-ST elevated myocardial infarction) ([place]) 03/08/2012  . Unstable angina ([place]) 03/04/2012  . CAD (coronary artery disease)   . Hypertension   . Personal history of noncompliance with medical treatment   . S/P AAA repair   . Peripheral vascular disease ([place])   . Tobacco abuse   . Hyperlipidemia   . CAD, bypass graft    Past Medical History:  Diagnosis Date  . Closed fracture of right distal tibia   . Coronary artery disease    a. 1999 Inf MI;  b. 2003 CABGx6;  c. 2008 NSTEMI, occluded SVG to marginal and diagonal system. 2 DES placed to marginal system;  c. NSTEMI - 02/2012 Xience Xpedition DES to oS-OM and POBA to mid lesion;  c. 10/2013 NSTEMI/PCI: VG->OM 80 ISR, native OM 80/90 small, LIMA->LAD ok w/ 90 dLAD, D1 80, RIMA->RPL ok w/ 95 in RPL (2.5x14 Resolute DES), nl EF.  Marland Kitchen GERD (gastroesophageal reflux disease)   . Hyperlipidemia   . Hypertension   . Myocardial infarction   . S/P AAA repair   . Tobacco abuse     Family History  Problem Relation Age of Onset  . Coronary artery disease      Past Surgical History:  Procedure Laterality Date  . ABDOMINAL AORTIC ANEURYSM REPAIR  2006  . APPENDECTOMY    . CORONARY ANGIOPLASTY WITH STENT PLACEMENT    .  CORONARY ARTERY BYPASS GRAFT  2003  . HERNIA REPAIR    . LEFT HEART CATHETERIZATION WITH CORONARY ANGIOGRAM N/A 03/05/2012   Procedure: LEFT HEART CATHETERIZATION WITH CORONARY ANGIOGRAM;  Surgeon: Hillary Bow, MD;  Location: [facility] CATH LAB;  Service: Cardiovascular;  Laterality: N/A;  . LEFT HEART CATHETERIZATION WITH CORONARY ANGIOGRAM N/A 10/23/2013   Procedure: LEFT HEART CATHETERIZATION WITH CORONARY ANGIOGRAM;  Surgeon: Blane Ohara, MD;  Location: [facility] CATH LAB;  Service: Cardiovascular;  Laterality: N/A;  . LEFT HEART CATHETERIZATION WITH CORONARY/GRAFT ANGIOGRAM  10/22/2013   Procedure: LEFT HEART CATHETERIZATION WITH Beatrix Fetters;  Surgeon: Leonie Man, MD;  Location: [facility] CATH  LAB;  Service: Cardiovascular;;  . PERCUTANEOUS CORONARY STENT INTERVENTION (PCI-S) N/A 03/07/2012   Procedure: PERCUTANEOUS CORONARY STENT INTERVENTION (PCI-S);  Surgeon: Sherren Mocha, MD;  Location: [facility] CATH LAB;  Service: Cardiovascular;  Laterality: N/A;  . PERCUTANEOUS CORONARY STENT INTERVENTION (PCI-S) Right 10/23/2013   Procedure: PERCUTANEOUS CORONARY STENT INTERVENTION (PCI-S);  Surgeon: Blane Ohara, MD;  Location: [facility] CATH LAB;  Service: Cardiovascular;  Laterality: Right;  . TIBIA IM NAIL INSERTION Right 04/07/2016   Procedure: FIXATION OF RIGHT TIBIA FRACTURE;  Surgeon: Garald Balding, MD;  Location: [place];  Service: Orthopedics;  Laterality: Right;   Social History   Occupational History  . Not on file.   Social History Main Topics  . Smoking status: Current Every Day Smoker    Packs/day: 0.50    Years: 45.00    Types: Cigarettes    Start date: 10/04/1964  . Smokeless tobacco: Never Used  . Alcohol use 1.8 oz/week    3 Shots of liquor per week     Comment: occasional  . Drug use: No  . Sexual activity: Yes

## 2016-06-28 ENCOUNTER — Other Ambulatory Visit: Payer: Self-pay

## 2016-06-28 MED ORDER — PANTOPRAZOLE SODIUM 40 MG PO TBEC: 40.0000 mg | DELAYED_RELEASE_TABLET | Freq: Two times a day (BID) | ORAL | 6 refills | Status: DC

## 2016-06-28 NOTE — Telephone Encounter (Signed)
Refill complete 

## 2016-07-28 ENCOUNTER — Other Ambulatory Visit: Payer: Self-pay | Admitting: Cardiovascular Disease

## 2016-08-03 ENCOUNTER — Encounter (INDEPENDENT_AMBULATORY_CARE_PROVIDER_SITE_OTHER): Payer: Self-pay | Admitting: Orthopaedic Surgery

## 2016-08-03 ENCOUNTER — Ambulatory Visit (INDEPENDENT_AMBULATORY_CARE_PROVIDER_SITE_OTHER): Payer: Self-pay | Admitting: Orthopaedic Surgery

## 2016-08-03 VITALS — BP 131/81 | HR 72 | Ht 72.0 in | Wt 203.0 lb

## 2016-08-03 DIAGNOSIS — S82231D Displaced oblique fracture of shaft of right tibia, subsequent encounter for closed fracture with routine healing: Secondary | ICD-10-CM

## 2016-08-03 DIAGNOSIS — J209 Acute bronchitis, unspecified: Secondary | ICD-10-CM | POA: Diagnosis not present

## 2016-08-03 DIAGNOSIS — R05 Cough: Secondary | ICD-10-CM | POA: Diagnosis not present

## 2016-08-03 DIAGNOSIS — J441 Chronic obstructive pulmonary disease with (acute) exacerbation: Secondary | ICD-10-CM | POA: Diagnosis not present

## 2016-08-03 DIAGNOSIS — Z6828 Body mass index (BMI) 28.0-28.9, adult: Secondary | ICD-10-CM | POA: Diagnosis not present

## 2016-08-03 NOTE — Progress Notes (Signed)
Office Visit Note   Patient: Clifford Jones           Date of Birth: 03/18/1951           MRN: [id number] Visit Date: 08/03/2016              Requested by: Rory Percy, MD [place], [place] [address] PCP: Rory Percy, MD   Assessment & Plan: Visit Diagnoses: Nearly 4 months status post IM nailing for displaced right tibia fracture. Doing very well without any complaints of pain. Does not use any ambulatory aid. Plan: We will see back on a when necessary basis   Follow-Up Instructions: No Follow-up on file.   Orders:  No orders of the defined types were placed in this encounter.  No orders of the defined types were placed in this encounter.     Procedures: No procedures performed   Clinical Data: No additional findings.   Subjective: Chief Complaint  Patient presents with  . Right Ankle - Routine Post Op    Patient presents in follow up, he is 4 months s/p ORIF Ankle fracture right. He states he is recovering well with no complaints of pain.     Review of Systems   Objective: Vital Signs: BP 131/81   Pulse 72   Ht 6' (1.829 m)   Wt 203 lb (92.1 kg)   BMI 27.53 kg/m   Physical Exam  Ortho Exam right leg exam reveals all of his wounds to heal nicely. No significant swelling. Neurovascular exam intact. No deformity. No areas of ecchymosis or erythema. No localized areas of tenderness. Painless range of motion of the right ankle and right knee.Marland Kitchen  Specialty Comments:  No specialty comments available.  Imaging: No results found.   PMFS History: Patient Active Problem List   Diagnosis Date Noted  . Fracture of distal end of fibula 04/08/2016  . Closed displaced comminuted fracture of shaft of right tibia 04/07/2016  . Fracture of distal end of tibia 04/07/2016  . Chest pain   . Elevated troponin 03/02/2015  . NSTEMI (non-ST elevated myocardial infarction) ([place]) 03/08/2012  . Unstable angina ([place]) 03/04/2012  . CAD (coronary artery disease)     . Hypertension   . Personal history of noncompliance with medical treatment   . S/P AAA repair   . Peripheral vascular disease ([place])   . Tobacco abuse   . Hyperlipidemia   . CAD, bypass graft    Past Medical History:  Diagnosis Date  . Closed fracture of right distal tibia   . Coronary artery disease    a. 1999 Inf MI;  b. 2003 CABGx6;  c. 2008 NSTEMI, occluded SVG to marginal and diagonal system. 2 DES placed to marginal system;  c. NSTEMI - 02/2012 Xience Xpedition DES to oS-OM and POBA to mid lesion;  c. 10/2013 NSTEMI/PCI: VG->OM 80 ISR, native OM 80/90 small, LIMA->LAD ok w/ 90 dLAD, D1 80, RIMA->RPL ok w/ 95 in RPL (2.5x14 Resolute DES), nl EF.  Marland Kitchen GERD (gastroesophageal reflux disease)   . Hyperlipidemia   . Hypertension   . Myocardial infarction   . S/P AAA repair   . Tobacco abuse     Family History  Problem Relation Age of Onset  . Coronary artery disease      Past Surgical History:  Procedure Laterality Date  . ABDOMINAL AORTIC ANEURYSM REPAIR  2006  . APPENDECTOMY    . CORONARY ANGIOPLASTY WITH STENT PLACEMENT    . CORONARY  ARTERY BYPASS GRAFT  2003  . HERNIA REPAIR    . LEFT HEART CATHETERIZATION WITH CORONARY ANGIOGRAM N/A 03/05/2012   Procedure: LEFT HEART CATHETERIZATION WITH CORONARY ANGIOGRAM;  Surgeon: Hillary Bow, MD;  Location: [facility] CATH LAB;  Service: Cardiovascular;  Laterality: N/A;  . LEFT HEART CATHETERIZATION WITH CORONARY ANGIOGRAM N/A 10/23/2013   Procedure: LEFT HEART CATHETERIZATION WITH CORONARY ANGIOGRAM;  Surgeon: Blane Ohara, MD;  Location: [facility] CATH LAB;  Service: Cardiovascular;  Laterality: N/A;  . LEFT HEART CATHETERIZATION WITH CORONARY/GRAFT ANGIOGRAM  10/22/2013   Procedure: LEFT HEART CATHETERIZATION WITH Beatrix Fetters;  Surgeon: Leonie Man, MD;  Location: [facility] CATH LAB;  Service: Cardiovascular;;  . PERCUTANEOUS CORONARY STENT INTERVENTION (PCI-S) N/A 03/07/2012   Procedure: PERCUTANEOUS CORONARY STENT INTERVENTION  (PCI-S);  Surgeon: Sherren Mocha, MD;  Location: [facility] CATH LAB;  Service: Cardiovascular;  Laterality: N/A;  . PERCUTANEOUS CORONARY STENT INTERVENTION (PCI-S) Right 10/23/2013   Procedure: PERCUTANEOUS CORONARY STENT INTERVENTION (PCI-S);  Surgeon: Blane Ohara, MD;  Location: [facility] CATH LAB;  Service: Cardiovascular;  Laterality: Right;  . TIBIA IM NAIL INSERTION Right 04/07/2016   Procedure: FIXATION OF RIGHT TIBIA FRACTURE;  Surgeon: Garald Balding, MD;  Location: [place];  Service: Orthopedics;  Laterality: Right;   Social History   Occupational History  . Not on file.   Social History Main Topics  . Smoking status: Current Every Day Smoker    Packs/day: 0.50    Years: 45.00    Types: Cigarettes    Start date: 10/04/1964  . Smokeless tobacco: Never Used  . Alcohol use 1.8 oz/week    3 Shots of liquor per week     Comment: occasional  . Drug use: No  . Sexual activity: Yes

## 2016-10-14 DIAGNOSIS — S82839A Other fracture of upper and lower end of unspecified fibula, initial encounter for closed fracture: Secondary | ICD-10-CM | POA: Diagnosis not present

## 2016-10-14 DIAGNOSIS — J449 Chronic obstructive pulmonary disease, unspecified: Secondary | ICD-10-CM | POA: Diagnosis not present

## 2016-10-14 DIAGNOSIS — S82309A Unspecified fracture of lower end of unspecified tibia, initial encounter for closed fracture: Secondary | ICD-10-CM | POA: Diagnosis not present

## 2016-10-14 DIAGNOSIS — S82251A Displaced comminuted fracture of shaft of right tibia, initial encounter for closed fracture: Secondary | ICD-10-CM | POA: Diagnosis not present

## 2016-11-13 DIAGNOSIS — J449 Chronic obstructive pulmonary disease, unspecified: Secondary | ICD-10-CM | POA: Diagnosis not present

## 2016-11-13 DIAGNOSIS — S82839A Other fracture of upper and lower end of unspecified fibula, initial encounter for closed fracture: Secondary | ICD-10-CM | POA: Diagnosis not present

## 2016-11-13 DIAGNOSIS — S82251A Displaced comminuted fracture of shaft of right tibia, initial encounter for closed fracture: Secondary | ICD-10-CM | POA: Diagnosis not present

## 2016-11-13 DIAGNOSIS — S82309A Unspecified fracture of lower end of unspecified tibia, initial encounter for closed fracture: Secondary | ICD-10-CM | POA: Diagnosis not present

## 2016-11-24 DIAGNOSIS — S62344A Nondisplaced fracture of base of fourth metacarpal bone, right hand, initial encounter for closed fracture: Secondary | ICD-10-CM | POA: Diagnosis not present

## 2016-11-24 DIAGNOSIS — S52611B Displaced fracture of right ulna styloid process, initial encounter for open fracture type I or II: Secondary | ICD-10-CM | POA: Diagnosis not present

## 2016-11-24 DIAGNOSIS — S52611A Displaced fracture of right ulna styloid process, initial encounter for closed fracture: Secondary | ICD-10-CM | POA: Diagnosis not present

## 2016-11-24 DIAGNOSIS — Z79899 Other long term (current) drug therapy: Secondary | ICD-10-CM | POA: Diagnosis not present

## 2016-11-24 DIAGNOSIS — I2510 Atherosclerotic heart disease of native coronary artery without angina pectoris: Secondary | ICD-10-CM | POA: Diagnosis not present

## 2016-11-24 DIAGNOSIS — Z951 Presence of aortocoronary bypass graft: Secondary | ICD-10-CM | POA: Diagnosis not present

## 2016-11-24 DIAGNOSIS — S52501B Unspecified fracture of the lower end of right radius, initial encounter for open fracture type I or II: Secondary | ICD-10-CM | POA: Diagnosis not present

## 2016-11-24 DIAGNOSIS — S62101B Fracture of unspecified carpal bone, right wrist, initial encounter for open fracture: Secondary | ICD-10-CM | POA: Diagnosis not present

## 2016-11-24 DIAGNOSIS — Z7982 Long term (current) use of aspirin: Secondary | ICD-10-CM | POA: Diagnosis not present

## 2016-11-24 DIAGNOSIS — F17200 Nicotine dependence, unspecified, uncomplicated: Secondary | ICD-10-CM | POA: Diagnosis not present

## 2016-11-24 DIAGNOSIS — S52511A Displaced fracture of right radial styloid process, initial encounter for closed fracture: Secondary | ICD-10-CM | POA: Diagnosis not present

## 2016-11-24 DIAGNOSIS — Z8249 Family history of ischemic heart disease and other diseases of the circulatory system: Secondary | ICD-10-CM | POA: Diagnosis not present

## 2016-11-24 DIAGNOSIS — I10 Essential (primary) hypertension: Secondary | ICD-10-CM | POA: Diagnosis not present

## 2016-11-24 DIAGNOSIS — W230XXA Caught, crushed, jammed, or pinched between moving objects, initial encounter: Secondary | ICD-10-CM | POA: Diagnosis not present

## 2016-11-25 DIAGNOSIS — S52591B Other fractures of lower end of right radius, initial encounter for open fracture type I or II: Secondary | ICD-10-CM | POA: Diagnosis not present

## 2016-11-25 DIAGNOSIS — S63014A Dislocation of distal radioulnar joint of right wrist, initial encounter: Secondary | ICD-10-CM | POA: Diagnosis not present

## 2016-11-25 DIAGNOSIS — S52511B Displaced fracture of right radial styloid process, initial encounter for open fracture type I or II: Secondary | ICD-10-CM | POA: Diagnosis not present

## 2016-11-25 DIAGNOSIS — Y999 Unspecified external cause status: Secondary | ICD-10-CM | POA: Diagnosis not present

## 2016-11-25 DIAGNOSIS — M8580 Other specified disorders of bone density and structure, unspecified site: Secondary | ICD-10-CM | POA: Diagnosis not present

## 2016-11-25 DIAGNOSIS — S52611A Displaced fracture of right ulna styloid process, initial encounter for closed fracture: Secondary | ICD-10-CM | POA: Diagnosis not present

## 2016-11-25 DIAGNOSIS — W230XXA Caught, crushed, jammed, or pinched between moving objects, initial encounter: Secondary | ICD-10-CM | POA: Diagnosis not present

## 2016-11-25 DIAGNOSIS — F17210 Nicotine dependence, cigarettes, uncomplicated: Secondary | ICD-10-CM | POA: Diagnosis not present

## 2016-11-25 DIAGNOSIS — I10 Essential (primary) hypertension: Secondary | ICD-10-CM | POA: Diagnosis not present

## 2016-11-25 DIAGNOSIS — S63391A Traumatic rupture of other ligament of right wrist, initial encounter: Secondary | ICD-10-CM | POA: Diagnosis not present

## 2016-11-25 DIAGNOSIS — Z9981 Dependence on supplemental oxygen: Secondary | ICD-10-CM | POA: Diagnosis not present

## 2016-11-25 DIAGNOSIS — S52571A Other intraarticular fracture of lower end of right radius, initial encounter for closed fracture: Secondary | ICD-10-CM | POA: Diagnosis not present

## 2016-11-25 DIAGNOSIS — I2510 Atherosclerotic heart disease of native coronary artery without angina pectoris: Secondary | ICD-10-CM | POA: Diagnosis not present

## 2016-11-25 DIAGNOSIS — M1811 Unilateral primary osteoarthritis of first carpometacarpal joint, right hand: Secondary | ICD-10-CM | POA: Diagnosis not present

## 2016-11-25 DIAGNOSIS — J449 Chronic obstructive pulmonary disease, unspecified: Secondary | ICD-10-CM | POA: Diagnosis not present

## 2016-11-25 DIAGNOSIS — S52101C Unspecified fracture of upper end of right radius, initial encounter for open fracture type IIIA, IIIB, or IIIC: Secondary | ICD-10-CM | POA: Diagnosis not present

## 2016-11-25 DIAGNOSIS — S61511A Laceration without foreign body of right wrist, initial encounter: Secondary | ICD-10-CM | POA: Diagnosis not present

## 2016-11-25 DIAGNOSIS — S6291XA Unspecified fracture of right wrist and hand, initial encounter for closed fracture: Secondary | ICD-10-CM | POA: Diagnosis not present

## 2016-11-25 DIAGNOSIS — M19031 Primary osteoarthritis, right wrist: Secondary | ICD-10-CM | POA: Diagnosis not present

## 2016-11-25 DIAGNOSIS — S52571C Other intraarticular fracture of lower end of right radius, initial encounter for open fracture type IIIA, IIIB, or IIIC: Secondary | ICD-10-CM | POA: Diagnosis not present

## 2016-11-25 DIAGNOSIS — M25531 Pain in right wrist: Secondary | ICD-10-CM | POA: Diagnosis not present

## 2016-11-26 DIAGNOSIS — S52201B Unspecified fracture of shaft of right ulna, initial encounter for open fracture type I or II: Secondary | ICD-10-CM | POA: Diagnosis not present

## 2016-11-26 DIAGNOSIS — S5291XB Unspecified fracture of right forearm, initial encounter for open fracture type I or II: Secondary | ICD-10-CM | POA: Diagnosis not present

## 2016-11-26 DIAGNOSIS — F17210 Nicotine dependence, cigarettes, uncomplicated: Secondary | ICD-10-CM | POA: Diagnosis not present

## 2016-11-26 DIAGNOSIS — I10 Essential (primary) hypertension: Secondary | ICD-10-CM | POA: Diagnosis not present

## 2016-11-26 DIAGNOSIS — I2510 Atherosclerotic heart disease of native coronary artery without angina pectoris: Secondary | ICD-10-CM | POA: Diagnosis not present

## 2016-11-27 DIAGNOSIS — I10 Essential (primary) hypertension: Secondary | ICD-10-CM | POA: Diagnosis not present

## 2016-11-27 DIAGNOSIS — F17210 Nicotine dependence, cigarettes, uncomplicated: Secondary | ICD-10-CM | POA: Diagnosis not present

## 2016-11-27 DIAGNOSIS — S52101C Unspecified fracture of upper end of right radius, initial encounter for open fracture type IIIA, IIIB, or IIIC: Secondary | ICD-10-CM | POA: Diagnosis not present

## 2016-11-27 DIAGNOSIS — S5291XB Unspecified fracture of right forearm, initial encounter for open fracture type I or II: Secondary | ICD-10-CM | POA: Diagnosis not present

## 2016-11-27 DIAGNOSIS — S52201B Unspecified fracture of shaft of right ulna, initial encounter for open fracture type I or II: Secondary | ICD-10-CM | POA: Diagnosis not present

## 2016-11-27 DIAGNOSIS — S52501B Unspecified fracture of the lower end of right radius, initial encounter for open fracture type I or II: Secondary | ICD-10-CM | POA: Diagnosis not present

## 2016-11-27 DIAGNOSIS — I2510 Atherosclerotic heart disease of native coronary artery without angina pectoris: Secondary | ICD-10-CM | POA: Diagnosis not present

## 2016-11-28 DIAGNOSIS — R0902 Hypoxemia: Secondary | ICD-10-CM | POA: Diagnosis not present

## 2016-11-28 DIAGNOSIS — S5291XB Unspecified fracture of right forearm, initial encounter for open fracture type I or II: Secondary | ICD-10-CM | POA: Diagnosis not present

## 2016-11-28 DIAGNOSIS — S63511A Sprain of carpal joint of right wrist, initial encounter: Secondary | ICD-10-CM | POA: Diagnosis not present

## 2016-11-28 DIAGNOSIS — M25331 Other instability, right wrist: Secondary | ICD-10-CM | POA: Diagnosis not present

## 2016-11-28 DIAGNOSIS — J810 Acute pulmonary edema: Secondary | ICD-10-CM | POA: Diagnosis not present

## 2016-11-28 DIAGNOSIS — S52101F Unspecified fracture of upper end of right radius, subsequent encounter for open fracture type IIIA, IIIB, or IIIC with routine healing: Secondary | ICD-10-CM | POA: Diagnosis not present

## 2016-11-28 DIAGNOSIS — F17210 Nicotine dependence, cigarettes, uncomplicated: Secondary | ICD-10-CM | POA: Diagnosis not present

## 2016-11-28 DIAGNOSIS — J9601 Acute respiratory failure with hypoxia: Secondary | ICD-10-CM | POA: Diagnosis not present

## 2016-11-28 DIAGNOSIS — J90 Pleural effusion, not elsewhere classified: Secondary | ICD-10-CM | POA: Diagnosis not present

## 2016-11-28 DIAGNOSIS — I10 Essential (primary) hypertension: Secondary | ICD-10-CM | POA: Diagnosis not present

## 2016-11-28 DIAGNOSIS — S52201B Unspecified fracture of shaft of right ulna, initial encounter for open fracture type I or II: Secondary | ICD-10-CM | POA: Diagnosis not present

## 2016-11-28 DIAGNOSIS — I2510 Atherosclerotic heart disease of native coronary artery without angina pectoris: Secondary | ICD-10-CM | POA: Diagnosis not present

## 2016-11-28 DIAGNOSIS — S52571C Other intraarticular fracture of lower end of right radius, initial encounter for open fracture type IIIA, IIIB, or IIIC: Secondary | ICD-10-CM | POA: Diagnosis not present

## 2016-11-29 DIAGNOSIS — S52201B Unspecified fracture of shaft of right ulna, initial encounter for open fracture type I or II: Secondary | ICD-10-CM | POA: Diagnosis not present

## 2016-11-29 DIAGNOSIS — J9811 Atelectasis: Secondary | ICD-10-CM | POA: Diagnosis not present

## 2016-11-29 DIAGNOSIS — D62 Acute posthemorrhagic anemia: Secondary | ICD-10-CM | POA: Diagnosis not present

## 2016-11-29 DIAGNOSIS — J9601 Acute respiratory failure with hypoxia: Secondary | ICD-10-CM | POA: Diagnosis not present

## 2016-11-29 DIAGNOSIS — F17200 Nicotine dependence, unspecified, uncomplicated: Secondary | ICD-10-CM | POA: Diagnosis not present

## 2016-11-29 DIAGNOSIS — S52501B Unspecified fracture of the lower end of right radius, initial encounter for open fracture type I or II: Secondary | ICD-10-CM | POA: Diagnosis not present

## 2016-11-29 DIAGNOSIS — I10 Essential (primary) hypertension: Secondary | ICD-10-CM | POA: Diagnosis not present

## 2016-11-29 DIAGNOSIS — E785 Hyperlipidemia, unspecified: Secondary | ICD-10-CM | POA: Diagnosis not present

## 2016-11-29 DIAGNOSIS — I2510 Atherosclerotic heart disease of native coronary artery without angina pectoris: Secondary | ICD-10-CM | POA: Diagnosis not present

## 2016-12-13 DIAGNOSIS — S52101F Unspecified fracture of upper end of right radius, subsequent encounter for open fracture type IIIA, IIIB, or IIIC with routine healing: Secondary | ICD-10-CM | POA: Diagnosis not present

## 2016-12-13 DIAGNOSIS — Z7982 Long term (current) use of aspirin: Secondary | ICD-10-CM | POA: Diagnosis not present

## 2016-12-13 DIAGNOSIS — I119 Hypertensive heart disease without heart failure: Secondary | ICD-10-CM | POA: Diagnosis not present

## 2016-12-13 DIAGNOSIS — F17210 Nicotine dependence, cigarettes, uncomplicated: Secondary | ICD-10-CM | POA: Diagnosis not present

## 2016-12-13 DIAGNOSIS — I2510 Atherosclerotic heart disease of native coronary artery without angina pectoris: Secondary | ICD-10-CM | POA: Diagnosis not present

## 2016-12-13 DIAGNOSIS — S52611D Displaced fracture of right ulna styloid process, subsequent encounter for closed fracture with routine healing: Secondary | ICD-10-CM | POA: Diagnosis not present

## 2016-12-13 DIAGNOSIS — S52501E Unspecified fracture of the lower end of right radius, subsequent encounter for open fracture type I or II with routine healing: Secondary | ICD-10-CM | POA: Diagnosis not present

## 2016-12-13 DIAGNOSIS — M8588 Other specified disorders of bone density and structure, other site: Secondary | ICD-10-CM | POA: Diagnosis not present

## 2016-12-13 DIAGNOSIS — Z79899 Other long term (current) drug therapy: Secondary | ICD-10-CM | POA: Diagnosis not present

## 2017-01-09 ENCOUNTER — Other Ambulatory Visit: Payer: Self-pay | Admitting: Cardiovascular Disease

## 2017-01-17 DIAGNOSIS — S52181F Other fracture of upper end of right radius, subsequent encounter for open fracture type IIIA, IIIB, or IIIC with routine healing: Secondary | ICD-10-CM | POA: Diagnosis not present

## 2017-01-17 DIAGNOSIS — S52611D Displaced fracture of right ulna styloid process, subsequent encounter for closed fracture with routine healing: Secondary | ICD-10-CM | POA: Diagnosis not present

## 2017-01-17 DIAGNOSIS — S52501D Unspecified fracture of the lower end of right radius, subsequent encounter for closed fracture with routine healing: Secondary | ICD-10-CM | POA: Diagnosis not present

## 2017-01-17 DIAGNOSIS — S52101F Unspecified fracture of upper end of right radius, subsequent encounter for open fracture type IIIA, IIIB, or IIIC with routine healing: Secondary | ICD-10-CM | POA: Diagnosis not present

## 2017-01-31 DIAGNOSIS — M25421 Effusion, right elbow: Secondary | ICD-10-CM | POA: Diagnosis not present

## 2017-01-31 DIAGNOSIS — Z6828 Body mass index (BMI) 28.0-28.9, adult: Secondary | ICD-10-CM | POA: Diagnosis not present

## 2017-01-31 DIAGNOSIS — M7021 Olecranon bursitis, right elbow: Secondary | ICD-10-CM | POA: Diagnosis not present

## 2017-01-31 DIAGNOSIS — M7020 Olecranon bursitis, unspecified elbow: Secondary | ICD-10-CM | POA: Diagnosis not present

## 2017-01-31 DIAGNOSIS — M25521 Pain in right elbow: Secondary | ICD-10-CM | POA: Diagnosis not present

## 2017-02-06 ENCOUNTER — Other Ambulatory Visit: Payer: Self-pay | Admitting: Cardiovascular Disease

## 2017-03-17 ENCOUNTER — Other Ambulatory Visit: Payer: Self-pay | Admitting: Cardiovascular Disease

## 2017-04-06 DIAGNOSIS — Z955 Presence of coronary angioplasty implant and graft: Secondary | ICD-10-CM | POA: Diagnosis not present

## 2017-04-06 DIAGNOSIS — I10 Essential (primary) hypertension: Secondary | ICD-10-CM | POA: Diagnosis not present

## 2017-04-06 DIAGNOSIS — Z6827 Body mass index (BMI) 27.0-27.9, adult: Secondary | ICD-10-CM | POA: Diagnosis not present

## 2017-04-06 DIAGNOSIS — F17210 Nicotine dependence, cigarettes, uncomplicated: Secondary | ICD-10-CM | POA: Diagnosis not present

## 2017-05-05 DIAGNOSIS — Z23 Encounter for immunization: Secondary | ICD-10-CM | POA: Diagnosis not present

## 2017-05-05 DIAGNOSIS — Z6829 Body mass index (BMI) 29.0-29.9, adult: Secondary | ICD-10-CM | POA: Diagnosis not present

## 2017-05-05 DIAGNOSIS — K429 Umbilical hernia without obstruction or gangrene: Secondary | ICD-10-CM | POA: Diagnosis not present

## 2017-05-08 DIAGNOSIS — R1033 Periumbilical pain: Secondary | ICD-10-CM | POA: Diagnosis not present

## 2017-05-10 DIAGNOSIS — I2510 Atherosclerotic heart disease of native coronary artery without angina pectoris: Secondary | ICD-10-CM | POA: Diagnosis not present

## 2017-05-10 DIAGNOSIS — R1033 Periumbilical pain: Secondary | ICD-10-CM | POA: Diagnosis not present

## 2017-05-10 DIAGNOSIS — I700 Atherosclerosis of aorta: Secondary | ICD-10-CM | POA: Diagnosis not present

## 2017-05-10 DIAGNOSIS — R109 Unspecified abdominal pain: Secondary | ICD-10-CM | POA: Diagnosis not present

## 2017-05-10 DIAGNOSIS — K449 Diaphragmatic hernia without obstruction or gangrene: Secondary | ICD-10-CM | POA: Diagnosis not present

## 2017-05-18 DIAGNOSIS — R1033 Periumbilical pain: Secondary | ICD-10-CM | POA: Diagnosis not present

## 2017-11-14 ENCOUNTER — Other Ambulatory Visit: Payer: Self-pay | Admitting: Cardiovascular Disease

## 2017-11-21 DIAGNOSIS — Z0000 Encounter for general adult medical examination without abnormal findings: Secondary | ICD-10-CM | POA: Diagnosis not present

## 2017-11-21 DIAGNOSIS — R5383 Other fatigue: Secondary | ICD-10-CM | POA: Diagnosis not present

## 2017-11-21 DIAGNOSIS — Z6830 Body mass index (BMI) 30.0-30.9, adult: Secondary | ICD-10-CM | POA: Diagnosis not present

## 2017-11-21 DIAGNOSIS — S80862A Insect bite (nonvenomous), left lower leg, initial encounter: Secondary | ICD-10-CM | POA: Diagnosis not present

## 2018-01-30 ENCOUNTER — Other Ambulatory Visit: Payer: Self-pay | Admitting: Cardiovascular Disease

## 2018-04-18 DIAGNOSIS — Z23 Encounter for immunization: Secondary | ICD-10-CM | POA: Diagnosis not present

## 2018-05-27 ENCOUNTER — Other Ambulatory Visit: Payer: Self-pay | Admitting: Cardiovascular Disease

## 2018-06-14 ENCOUNTER — Other Ambulatory Visit: Payer: Self-pay | Admitting: Cardiovascular Disease

## 2018-07-06 ENCOUNTER — Encounter (HOSPITAL_COMMUNITY): Payer: Self-pay | Admitting: *Deleted

## 2018-07-06 ENCOUNTER — Other Ambulatory Visit: Payer: Self-pay

## 2018-07-06 ENCOUNTER — Encounter (HOSPITAL_COMMUNITY)
Admission: RE | Disposition: A | Discharge status: Home / Self Care | Payer: Self-pay | Source: Ambulatory Visit | Attending: Internal Medicine

## 2018-07-06 ENCOUNTER — Ambulatory Visit (HOSPITAL_COMMUNITY)
Admission: RE | Admit: 2018-07-06 | Discharge: 2018-07-06 | Disposition: A | Discharge status: Home / Self Care | Payer: Medicare HMO | Source: Ambulatory Visit | Attending: Internal Medicine | Admitting: Internal Medicine

## 2018-07-06 ENCOUNTER — Emergency Department (HOSPITAL_COMMUNITY)
Admission: EM | Admit: 2018-07-06 | Discharge: 2018-07-06 | Discharge status: Absent without leave | Payer: Commercial Managed Care - HMO

## 2018-07-06 DIAGNOSIS — I2510 Atherosclerotic heart disease of native coronary artery without angina pectoris: Secondary | ICD-10-CM | POA: Diagnosis not present

## 2018-07-06 DIAGNOSIS — Z79891 Long term (current) use of opiate analgesic: Secondary | ICD-10-CM | POA: Diagnosis not present

## 2018-07-06 DIAGNOSIS — Z8679 Personal history of other diseases of the circulatory system: Secondary | ICD-10-CM | POA: Diagnosis not present

## 2018-07-06 DIAGNOSIS — I10 Essential (primary) hypertension: Secondary | ICD-10-CM | POA: Insufficient documentation

## 2018-07-06 DIAGNOSIS — M542 Cervicalgia: Secondary | ICD-10-CM | POA: Diagnosis not present

## 2018-07-06 DIAGNOSIS — K449 Diaphragmatic hernia without obstruction or gangrene: Secondary | ICD-10-CM | POA: Diagnosis not present

## 2018-07-06 DIAGNOSIS — K219 Gastro-esophageal reflux disease without esophagitis: Secondary | ICD-10-CM | POA: Insufficient documentation

## 2018-07-06 DIAGNOSIS — Z955 Presence of coronary angioplasty implant and graft: Secondary | ICD-10-CM | POA: Diagnosis not present

## 2018-07-06 DIAGNOSIS — Z8249 Family history of ischemic heart disease and other diseases of the circulatory system: Secondary | ICD-10-CM | POA: Diagnosis not present

## 2018-07-06 DIAGNOSIS — E785 Hyperlipidemia, unspecified: Secondary | ICD-10-CM | POA: Insufficient documentation

## 2018-07-06 DIAGNOSIS — F17210 Nicotine dependence, cigarettes, uncomplicated: Secondary | ICD-10-CM | POA: Diagnosis not present

## 2018-07-06 DIAGNOSIS — X58XXXA Exposure to other specified factors, initial encounter: Secondary | ICD-10-CM | POA: Diagnosis not present

## 2018-07-06 DIAGNOSIS — K222 Esophageal obstruction: Secondary | ICD-10-CM | POA: Diagnosis not present

## 2018-07-06 DIAGNOSIS — Z7982 Long term (current) use of aspirin: Secondary | ICD-10-CM | POA: Insufficient documentation

## 2018-07-06 DIAGNOSIS — Z79899 Other long term (current) drug therapy: Secondary | ICD-10-CM | POA: Diagnosis not present

## 2018-07-06 DIAGNOSIS — D135 Benign neoplasm of extrahepatic bile ducts: Secondary | ICD-10-CM | POA: Diagnosis not present

## 2018-07-06 DIAGNOSIS — R198 Other specified symptoms and signs involving the digestive system and abdomen: Secondary | ICD-10-CM | POA: Diagnosis not present

## 2018-07-06 DIAGNOSIS — D132 Benign neoplasm of duodenum: Secondary | ICD-10-CM | POA: Insufficient documentation

## 2018-07-06 DIAGNOSIS — K317 Polyp of stomach and duodenum: Secondary | ICD-10-CM | POA: Diagnosis not present

## 2018-07-06 DIAGNOSIS — T18128A Food in esophagus causing other injury, initial encounter: Secondary | ICD-10-CM | POA: Diagnosis not present

## 2018-07-06 DIAGNOSIS — I252 Old myocardial infarction: Secondary | ICD-10-CM | POA: Insufficient documentation

## 2018-07-06 DIAGNOSIS — Z951 Presence of aortocoronary bypass graft: Secondary | ICD-10-CM | POA: Diagnosis not present

## 2018-07-06 DIAGNOSIS — T18108A Unspecified foreign body in esophagus causing other injury, initial encounter: Secondary | ICD-10-CM | POA: Diagnosis not present

## 2018-07-06 HISTORY — PX: POLYPECTOMY: SHX5525

## 2018-07-06 HISTORY — PX: ESOPHAGEAL DILATION: SHX303

## 2018-07-06 HISTORY — PX: FOREIGN BODY REMOVAL: SHX962

## 2018-07-06 HISTORY — PX: ESOPHAGOGASTRODUODENOSCOPY: SHX5428

## 2018-07-06 SURGERY — EGD (ESOPHAGOGASTRODUODENOSCOPY)
Anesthesia: Moderate Sedation

## 2018-07-06 MED ORDER — SODIUM CHLORIDE 0.9 % IV SOLN
INTRAVENOUS | Status: DC
  Administered 2018-07-06: 13:00:00 via INTRAVENOUS

## 2018-07-06 MED ORDER — LIDOCAINE VISCOUS HCL 2 % MT SOLN
OROMUCOSAL | Status: AC
  Filled 2018-07-06: qty 15

## 2018-07-06 MED ORDER — MEPERIDINE HCL 50 MG/ML IJ SOLN
INTRAMUSCULAR | Status: AC
  Filled 2018-07-06: qty 1

## 2018-07-06 MED ORDER — MEPERIDINE HCL 50 MG/ML IJ SOLN
INTRAMUSCULAR | Status: DC | PRN
  Administered 2018-07-06 (×2): 25 mg via INTRAVENOUS

## 2018-07-06 MED ORDER — MIDAZOLAM HCL 5 MG/5ML IJ SOLN
INTRAMUSCULAR | Status: DC | PRN
  Administered 2018-07-06: 1 mg via INTRAVENOUS
  Administered 2018-07-06 (×2): 2 mg via INTRAVENOUS

## 2018-07-06 MED ORDER — LIDOCAINE VISCOUS HCL 2 % MT SOLN
OROMUCOSAL | Status: DC | PRN
  Administered 2018-07-06: 4 mL via OROMUCOSAL

## 2018-07-06 MED ORDER — STERILE WATER FOR IRRIGATION IR SOLN
Status: DC | PRN
  Administered 2018-07-06: 100 mL

## 2018-07-06 MED ORDER — MIDAZOLAM HCL 5 MG/5ML IJ SOLN
INTRAMUSCULAR | Status: AC
  Filled 2018-07-06: qty 10

## 2018-07-06 NOTE — H&P (Signed)
Clifford Jones is an 68 y.o. male.   Chief Complaint: Patient is here for EGD with foreign body removal and esophageal dilation. HPI: Patient is 68 year old Caucasian male who has chronic GERD and underwent esophageal dilation several years ago possible for Schatzki's ring who presented to emergency room at [facility] yesterday for esophageal foreign body.  He states he was eating chicken last night and it would not go down.  Since then he has not been able to swallow liquids or saliva.  I was contacted since patient could not undergo procedure at that facility.  She states heartburn is well controlled with pantoprazole.  He has been having intermittent swallowing difficulty for maybe a year or longer.  He denies nausea vomiting painful swallowing melena or abdominal pain.  Last aspirin dose was yesterday. Patient smokes half to 1 pack of cigarettes per day.  He drinks 3 drinks per week.  Past Medical History:  Diagnosis Date  . Closed fracture of right distal tibia   . Coronary artery disease    a. 1999 Inf MI;  b. 2003 CABGx6;  c. 2008 NSTEMI, occluded SVG to marginal and diagonal system. 2 DES placed to marginal system;  c. NSTEMI - 02/2012 Xience Xpedition DES to oS-OM and POBA to mid lesion;  c. 10/2013 NSTEMI/PCI: VG->OM 80 ISR, native OM 80/90 small, LIMA->LAD ok w/ 90 dLAD, D1 80, RIMA->RPL ok w/ 95 in RPL (2.5x14 Resolute DES), nl EF.  Marland Kitchen GERD (gastroesophageal reflux disease)   . Hyperlipidemia   . Hypertension   . Myocardial infarction ([place])    X 6  . S/P AAA repair   . Tobacco abuse     Past Surgical History:  Procedure Laterality Date  . ABDOMINAL AORTIC ANEURYSM REPAIR  2006  . APPENDECTOMY    . CORONARY ANGIOPLASTY WITH STENT PLACEMENT    . CORONARY ARTERY BYPASS GRAFT  2003  . HERNIA REPAIR    . LEFT HEART CATHETERIZATION WITH CORONARY ANGIOGRAM N/A 03/05/2012   Procedure: LEFT HEART CATHETERIZATION WITH CORONARY ANGIOGRAM;  Surgeon: Hillary Bow, MD;  Location: [facility] CATH  LAB;  Service: Cardiovascular;  Laterality: N/A;  . LEFT HEART CATHETERIZATION WITH CORONARY ANGIOGRAM N/A 10/23/2013   Procedure: LEFT HEART CATHETERIZATION WITH CORONARY ANGIOGRAM;  Surgeon: Blane Ohara, MD;  Location: [facility] CATH LAB;  Service: Cardiovascular;  Laterality: N/A;  . LEFT HEART CATHETERIZATION WITH CORONARY/GRAFT ANGIOGRAM  10/22/2013   Procedure: LEFT HEART CATHETERIZATION WITH Beatrix Fetters;  Surgeon: Leonie Man, MD;  Location: [facility] CATH LAB;  Service: Cardiovascular;;  . PERCUTANEOUS CORONARY STENT INTERVENTION (PCI-S) N/A 03/07/2012   Procedure: PERCUTANEOUS CORONARY STENT INTERVENTION (PCI-S);  Surgeon: Sherren Mocha, MD;  Location: [facility] CATH LAB;  Service: Cardiovascular;  Laterality: N/A;  . PERCUTANEOUS CORONARY STENT INTERVENTION (PCI-S) Right 10/23/2013   Procedure: PERCUTANEOUS CORONARY STENT INTERVENTION (PCI-S);  Surgeon: Blane Ohara, MD;  Location: [facility] CATH LAB;  Service: Cardiovascular;  Laterality: Right;  . Right hand surgery    . TIBIA IM NAIL INSERTION Right 04/07/2016   Procedure: FIXATION OF RIGHT TIBIA FRACTURE;  Surgeon: Garald Balding, MD;  Location: [place];  Service: Orthopedics;  Laterality: Right;    Family History  Problem Relation Age of Onset  . Coronary artery disease Other    Social History:  reports that he has been smoking cigarettes. He started smoking about 53 years ago. He has a 53.00 pack-year smoking history. He has never used smokeless tobacco. He reports current alcohol use of about  4.0 standard drinks of alcohol per week. He reports that he does not use drugs.  Allergies:  Allergies  Allergen Reactions  . No Known Allergies     Medications Prior to Admission  Medication Sig Dispense Refill  . acetaminophen (TYLENOL) 325 MG tablet Take 2 tablets (650 mg total) by mouth every 4 (four) hours as needed for headache or mild pain.    Marland Kitchen amLODipine (NORVASC) 5 MG tablet TAKE ONE TABLET BY MOUTH EVERY DAY 15 tablet 0  .  aspirin EC 81 MG EC tablet Take 1 tablet (81 mg total) by mouth daily. 30 tablet 11  . carvedilol (COREG) 6.25 MG tablet TAKE ONE TABLET BY MOUTH TWICE DAILY 30 tablet 0  . lisinopril (PRINIVIL,ZESTRIL) 20 MG tablet TAKE ONE TABLET BY MOUTH EVERY DAY 15 tablet 0  . pantoprazole (PROTONIX) 40 MG tablet TAKE 1 TABLET BY MOUTH TWICE DAILY 60 tablet 3  . RANEXA 1000 MG SR tablet TAKE ONE TABLET BY MOUTH TWICE DAILY 30 tablet 0  . rosuvastatin (CRESTOR) 40 MG tablet TAKE ONE TABLET BY MOUTH EVERY DAY 15 tablet 0  . ANTIFUNGAL 1 % AERO     . Camphor-Eucalyptus-Menthol (MEDICATED CHEST RUB) 4.73-1.2-2.6 % OINT     . DIABETIC BASICS HEALTHY FOOT 0.13 % LOTN     . ipratropium-albuterol (DUONEB) 0.5-2.5 (3) MG/3ML SOLN     . nitroGLYCERIN (NITROSTAT) 0.4 MG SL tablet Place 1 tablet (0.4 mg total) under the tongue every 5 (five) minutes x 3 doses as needed for chest pain. 25 tablet 4  . oxyCODONE (OXY IR/ROXICODONE) 5 MG immediate release tablet Take 1-2 tablets (5-10 mg total) by mouth every 4 (four) hours as needed for moderate pain, severe pain or breakthrough pain. 80 tablet 0  . RaNITidine HCl (RANITIDINE 75 PO)     . SYMBICORT 160-4.5 MCG/ACT inhaler       No results found for this or any previous visit (from the past 48 hour(s)). No results found.  ROS  Blood pressure (!) 155/86, pulse 81, temperature 98.1 F (36.7 C), temperature source Oral, resp. rate 18, height 6' (1.829 m), weight 101.7 kg, SpO2 92 %. Physical Exam  Constitutional: He appears well-developed and well-nourished.  HENT:  Mouth/Throat: Oropharynx is clear and moist.  Patient is edentulous.  He was upper and lower dentures at mealtime.  Eyes: No scleral icterus.  Neck: No thyromegaly present.  Cardiovascular: Normal rate, regular rhythm and normal heart sounds.  No murmur heard. Respiratory: Effort normal and breath sounds normal.  Midsternal  scar.  GI:  Abdomen is full with upper midline scar.  On palpation is soft  and nontender with organomegaly or masses.  Musculoskeletal:        General: No edema.  Lymphadenopathy:    He has no cervical adenopathy.  Neurological: He is alert.  Skin: Skin is warm and dry.     Assessment/Plan Foreign body esophagus. EGD with foreign body removal and esophageal dilation.  Hildred Laser, MD 07/06/2018, 2:05 PM

## 2018-07-06 NOTE — Op Note (Addendum)
[facility] Patient Name: Clifford Jones Procedure Date: 07/06/2018 1:42 PM MRN: [id number] Date of Birth: Jan 16, 1951 Attending MD: Hildred Laser , MD CSN: [id number] Age: 68 Admit Type: Outpatient Procedure:                Upper GI endoscopy Indications:              Foreign body in the esophagus Providers:                Hildred Laser, MD, Rosina Lowenstein, RN, Aram Candela Referring MD:             ER [facility] and Rory Percy, MD(PCP) Medicines:                Lidocaine spray, Meperidine 50 mg IV, Midazolam 5                            mg IV Complications:            No immediate complications. Estimated Blood Loss:     Estimated blood loss was minimal. Procedure:                Pre-Anesthesia Assessment:                           - Prior to the procedure, a History and Physical                            was performed, and patient medications and                            allergies were reviewed. The patient's tolerance of                            previous anesthesia was also reviewed. The risks                            and benefits of the procedure and the sedation                            options and risks were discussed with the patient.                            All questions were answered, and informed consent                            was obtained. Prior Anticoagulants: The patient                            last took aspirin 1 day prior to the procedure. ASA                            Grade Assessment: III - A patient with severe                            systemic disease. After reviewing the risks and  benefits, the patient was deemed in satisfactory                            condition to undergo the procedure.                           After obtaining informed consent, the endoscope was                            passed under direct vision. Throughout the                            procedure, the patient's blood pressure, pulse,  and                            oxygen saturations were monitored continuously. The                            GIF-H190 ([id number]) was introduced through the                            mouth, and advanced to the second part of duodenum.                            The upper GI endoscopy was accomplished without                            difficulty. The patient tolerated the procedure                            well. Scope In: 2:16:58 PM Scope Out: 2:33:39 PM Total Procedure Duration: 0 hours 16 minutes 41 seconds  Findings:      Food was found in the distal esophagus. Removal was accomplished with a       snare.      A moderate Schatzki ring was found at the gastroesophageal junction. A       TTS dilator was passed through the scope. Dilation with an 18-19-20 mm       balloon dilator was performed to 18 mm and 19 mm. The dilation site was       examined and showed mild mucosal disruption, moderate improvement in       luminal narrowing and no perforation.      A 3 cm hiatal hernia was present.      The entire examined stomach was normal.      The duodenal bulb was normal.      A single medium-sized semi-sessile polyp was found at the major papilla.       Biopsies were taken with a cold forceps for histology. Impression:               - Food was found in the esophagus. Removal was                            successful.                           -  Moderate Schatzki ring. Dilated.                           - 3 cm hiatal hernia.                           - Normal stomach.                           - Normal duodenal bulb.                           - A single duodenal polyp. Biopsied. Moderate Sedation:      Moderate (conscious) sedation was administered by the endoscopy nurse       and supervised by the endoscopist. The following parameters were       monitored: oxygen saturation, heart rate, blood pressure, CO2       capnography and response to care. Total physician intraservice time was        22 minutes. Recommendation:           - Patient has a contact number available for                            emergencies. The signs and symptoms of potential                            delayed complications were discussed with the                            patient. Return to normal activities tomorrow.                            Written discharge instructions were provided to the                            patient.                           - Resume previous diet today.                           - Continue present medications.                           - Resume aspirin at prior dose in 3 days.                           - Await pathology results. Procedure Code(s):        --- Professional ---                           [phone], Esophagogastroduodenoscopy, flexible,                            transoral; with removal of foreign body(s)  [phone], Esophagogastroduodenoscopy, flexible,                            transoral; with transendoscopic balloon dilation of                            esophagus (less than 30 mm diameter)                           G0500, Moderate sedation services provided by the                            same physician or other qualified health care                            professional performing a gastrointestinal                            endoscopic service that sedation supports,                            requiring the presence of an independent trained                            observer to assist in the monitoring of the                            patient's level of consciousness and physiological                            status; initial 15 minutes of intra-service time;                            patient age 74 years or older (additional time may                            be reported with [phone], as appropriate) Diagnosis Code(s):        --- Professional ---                           [phone], Food in esophagus causing other  injury,                            initial encounter                           K22.2, Esophageal obstruction                           K44.9, Diaphragmatic hernia without obstruction or                            gangrene                           K31.7, Polyp of stomach and duodenum  T18.108A, Unspecified foreign body in esophagus                            causing other injury, initial encounter CPT copyright 2018 American Medical Association. All rights reserved. The codes documented in this report are preliminary and upon coder review may  be revised to meet current compliance requirements. Hildred Laser, MD Hildred Laser, MD 07/06/2018 2:48:20 PM This report has been signed electronically. Number of Addenda: 0

## 2018-07-06 NOTE — Discharge Instructions (Signed)
Resume aspirin on 07/09/2018. Resume other medications as before. Not take ranitidine/Zantac anymore.  Can take Pepcid OTC 20 mg in the evening on as-needed basis. Resume usual diet. No driving for 24 hours. Physician will call with biopsy results.   Upper Endoscopy, Adult, Care After This sheet gives you information about how to care for yourself after your procedure. Your health care provider may also give you more specific instructions. If you have problems or questions, contact your health care provider. What can I expect after the procedure? After the procedure, it is common to have:  A sore throat.  Mild stomach pain or discomfort.  Bloating.  Nausea. Follow these instructions at home:   Follow instructions from your health care provider about what to eat or drink after your procedure.  Return to your normal activities as told by your health care provider. Ask your health care provider what activities are safe for you.  Take over-the-counter and prescription medicines only as told by your health care provider.  Do not drive for 24 hours if you were given a sedative during your procedure.  Keep all follow-up visits as told by your health care provider. This is important. Contact a health care provider if you have:  A sore throat that lasts longer than one day.  Trouble swallowing. Get help right away if:  You vomit blood or your vomit looks like coffee grounds.  You have: ? A fever. ? Bloody, black, or tarry stools. ? A severe sore throat or you cannot swallow. ? Difficulty breathing. ? Severe pain in your chest or abdomen. Summary  After the procedure, it is common to have a sore throat, mild stomach discomfort, bloating, and nausea.  Do not drive for 24 hours if you were given a sedative during the procedure.  Follow instructions from your health care provider about what to eat or drink after your procedure.  Return to your normal activities as told by  your health care provider. This information is not intended to replace advice given to you by your health care provider. Make sure you discuss any questions you have with your health care provider. Document Released: 12/06/2011 Document Revised: 11/06/2017 Document Reviewed: 11/06/2017 Elsevier Interactive Patient Education  2019 [employer].

## 2018-07-10 ENCOUNTER — Encounter (HOSPITAL_COMMUNITY): Payer: Self-pay | Admitting: Internal Medicine

## 2018-07-20 ENCOUNTER — Telehealth: Payer: Self-pay

## 2018-07-20 NOTE — Telephone Encounter (Signed)
Mansouraty, Telford Nab., MD  Milus Banister, MD; Timothy Lasso, RN        Avril Busser, I spoke with Dr. Laural Golden about this.  Schedule patient for an EUS with me so that I can evaluate and see the lesion.  Then based on the EUS findings we will schedule an ERCP with ampullectomy.  I like to have the 2 procedures on separate days since it will allow [place] time to have an adequate discussion with the patient.  Thanks.  Chester Holstein

## 2018-07-23 ENCOUNTER — Other Ambulatory Visit: Payer: Self-pay

## 2018-07-23 DIAGNOSIS — K229 Disease of esophagus, unspecified: Secondary | ICD-10-CM

## 2018-07-23 NOTE — Telephone Encounter (Signed)
08/16/18 at 11:30 am EUS with Dr Ardis Hughs.  Left message on machine to call back

## 2018-07-24 NOTE — Telephone Encounter (Signed)
EUS scheduled, pt instructed and medications reviewed.  Patient instructions mailed to home.  Patient to call with any questions or concerns.  

## 2018-07-25 DIAGNOSIS — H524 Presbyopia: Secondary | ICD-10-CM | POA: Diagnosis not present

## 2018-08-16 ENCOUNTER — Ambulatory Visit (HOSPITAL_COMMUNITY): Payer: Medicare HMO | Admitting: Registered Nurse

## 2018-08-16 ENCOUNTER — Other Ambulatory Visit: Payer: Self-pay

## 2018-08-16 ENCOUNTER — Encounter (HOSPITAL_COMMUNITY): Payer: Self-pay | Admitting: Gastroenterology

## 2018-08-16 ENCOUNTER — Ambulatory Visit (HOSPITAL_COMMUNITY)
Admission: RE | Admit: 2018-08-16 | Discharge: 2018-08-16 | Disposition: A | Discharge status: Home / Self Care | Payer: Medicare HMO | Attending: Gastroenterology | Admitting: Gastroenterology

## 2018-08-16 ENCOUNTER — Encounter (HOSPITAL_COMMUNITY)
Admission: RE | Disposition: A | Discharge status: Home / Self Care | Payer: Self-pay | Source: Home / Self Care | Attending: Gastroenterology

## 2018-08-16 DIAGNOSIS — K229 Disease of esophagus, unspecified: Secondary | ICD-10-CM

## 2018-08-16 DIAGNOSIS — Z8249 Family history of ischemic heart disease and other diseases of the circulatory system: Secondary | ICD-10-CM | POA: Diagnosis not present

## 2018-08-16 DIAGNOSIS — I252 Old myocardial infarction: Secondary | ICD-10-CM | POA: Insufficient documentation

## 2018-08-16 DIAGNOSIS — E785 Hyperlipidemia, unspecified: Secondary | ICD-10-CM | POA: Diagnosis not present

## 2018-08-16 DIAGNOSIS — K219 Gastro-esophageal reflux disease without esophagitis: Secondary | ICD-10-CM | POA: Diagnosis not present

## 2018-08-16 DIAGNOSIS — K3189 Other diseases of stomach and duodenum: Secondary | ICD-10-CM | POA: Diagnosis not present

## 2018-08-16 DIAGNOSIS — I739 Peripheral vascular disease, unspecified: Secondary | ICD-10-CM | POA: Insufficient documentation

## 2018-08-16 DIAGNOSIS — Z955 Presence of coronary angioplasty implant and graft: Secondary | ICD-10-CM | POA: Diagnosis not present

## 2018-08-16 DIAGNOSIS — F17210 Nicotine dependence, cigarettes, uncomplicated: Secondary | ICD-10-CM | POA: Diagnosis not present

## 2018-08-16 DIAGNOSIS — J449 Chronic obstructive pulmonary disease, unspecified: Secondary | ICD-10-CM | POA: Diagnosis not present

## 2018-08-16 DIAGNOSIS — I2510 Atherosclerotic heart disease of native coronary artery without angina pectoris: Secondary | ICD-10-CM | POA: Insufficient documentation

## 2018-08-16 DIAGNOSIS — D135 Benign neoplasm of extrahepatic bile ducts: Secondary | ICD-10-CM | POA: Diagnosis not present

## 2018-08-16 DIAGNOSIS — I10 Essential (primary) hypertension: Secondary | ICD-10-CM | POA: Insufficient documentation

## 2018-08-16 DIAGNOSIS — D490 Neoplasm of unspecified behavior of digestive system: Secondary | ICD-10-CM | POA: Diagnosis not present

## 2018-08-16 DIAGNOSIS — Z951 Presence of aortocoronary bypass graft: Secondary | ICD-10-CM | POA: Diagnosis not present

## 2018-08-16 HISTORY — PX: EUS: SHX5427

## 2018-08-16 HISTORY — PX: ESOPHAGOGASTRODUODENOSCOPY: SHX5428

## 2018-08-16 SURGERY — UPPER ENDOSCOPIC ULTRASOUND (EUS) RADIAL
Anesthesia: Monitor Anesthesia Care

## 2018-08-16 MED ORDER — LIDOCAINE 2% (20 MG/ML) 5 ML SYRINGE
INTRAMUSCULAR | Status: DC | PRN
  Administered 2018-08-16: 100 mg via INTRAVENOUS

## 2018-08-16 MED ORDER — IPRATROPIUM-ALBUTEROL 0.5-2.5 (3) MG/3ML IN SOLN
3.0000 mL | Freq: Once | RESPIRATORY_TRACT | Status: AC
  Administered 2018-08-16: 3 mL via RESPIRATORY_TRACT
  Filled 2018-08-16: qty 3

## 2018-08-16 MED ORDER — PROPOFOL 10 MG/ML IV BOLUS
INTRAVENOUS | Status: DC | PRN
  Administered 2018-08-16: 20 mg via INTRAVENOUS

## 2018-08-16 MED ORDER — PROPOFOL 500 MG/50ML IV EMUL
INTRAVENOUS | Status: DC | PRN
  Administered 2018-08-16: 100 ug/kg/min via INTRAVENOUS

## 2018-08-16 MED ORDER — SODIUM CHLORIDE 0.9 % IV SOLN: INTRAVENOUS | Status: DC

## 2018-08-16 MED ORDER — LACTATED RINGERS IV SOLN
INTRAVENOUS | Status: DC
  Administered 2018-08-16: 1000 mL via INTRAVENOUS

## 2018-08-16 MED ORDER — PROPOFOL 10 MG/ML IV BOLUS
INTRAVENOUS | Status: AC
  Filled 2018-08-16: qty 20

## 2018-08-16 MED ORDER — PROPOFOL 10 MG/ML IV BOLUS
INTRAVENOUS | Status: AC
  Filled 2018-08-16: qty 40

## 2018-08-16 NOTE — Op Note (Signed)
[facility] Patient Name: Clifford Jones Procedure Date: 08/16/2018 MRN: [id number] Attending MD: Milus Banister , MD Date of Birth: 06/18/1951 CSN: [id number] Age: 68 Admit Type: Outpatient Procedure:                Upper EUS Indications:              ampullary adenoma noted during recent EGD for                            esopahgeal food impaction (Dr. Laural Golden) Providers:                Milus Banister, MD, Vista Lawman, RN, Cleda Daub, RN, Tinnie Gens, Technician, Cletis Athens,                            Technician, Marla Roe, CRNA Referring MD:              Medicines:                Monitored Anesthesia Care Complications:            No immediate complications. Estimated blood loss:                            None. Estimated Blood Loss:     Estimated blood loss: none. Procedure:                Pre-Anesthesia Assessment:                           - Prior to the procedure, a History and Physical                            was performed, and patient medications and                            allergies were reviewed. The patient's tolerance of                            previous anesthesia was also reviewed. The risks                            and benefits of the procedure and the sedation                            options and risks were discussed with the patient.                            All questions were answered, and informed consent                            was obtained. Prior Anticoagulants: The patient has  taken no previous anticoagulant or antiplatelet                            agents. ASA Grade Assessment: II - A patient with                            mild systemic disease. After reviewing the risks                            and benefits, the patient was deemed in                            satisfactory condition to undergo the procedure.                           After obtaining informed  consent, the endoscope was                            passed under direct vision. Throughout the                            procedure, the patient's blood pressure, pulse, and                            oxygen saturations were monitored continuously. The                            [id number] ([id number]) Olympus Radial EUS was                            introduced through the mouth, and advanced to the                            second part of duodenum. The [id number] ([id number])                            Olympus duodenoscope was introduced through the                            mouth, and advanced to the second part of duodenum.                            The upper EUS was accomplished without difficulty.                            The patient tolerated the procedure well. Scope In: Scope Out: Findings:      ENDOSCOPIC FINDING: (limited views with radial EUS and duodenoscope) :      The examined esophagus was endoscopically normal.      The entire examined stomach was endoscopically normal.      Approximately 1.2cm polyp located at the major papilla. The ampullary       orifice was at the most caudal aspect of the polypoid lesion       (intermittent bile seen draining  from that point) see images.      ENDOSONOGRAPHIC FINDING: :      1. The ampullary polyp described above correlated with a hypoechoic       lesion that measured 1.5cm in maximum dimension and was confined to the       mucosa, deep mucosa layers of the duodenum. The polyp does not appear to       involve the main pancreatic duct or CBD.      2. Pancreatic parenchyma was normal throughout; no discrete masses or       signs of chronic pancreatitis.      3. No peripancreatic adenopathy.      4. Main pancreatid duct was normal; non-dilated      5. CBD was normal; non-dilated and contained no stones.      5. Limited views of the liver, spleen, gallbladder were all normal. Impression:               - Approximately 1.2cm polyp located  at the major                            papilla. The ampullary orifice was at the most                            caudal aspect of the polypoid lesion (intermittent                            bile seen draining from that point) see images. The                            ampullary polyp correlates with a hypoechoic lesion                            by EUS that measured 1.5cm in maximum dimension and                            was confined to the mucosa, deep mucosa layers of                            the duodenum. The polyp does not appear to involve                            the main pancreatic duct or CBD. Moderate Sedation:      Not Applicable - Patient had care per Anesthesia. Recommendation:           - Discharge patient to home.                           - I will communicate these results with Dr.                            Rush Landmark who will consider ampullary polyp                            resection. Procedure Code(s):        --- Professional ---  26378, Esophagogastroduodenoscopy, flexible,                            transoral; with endoscopic ultrasound examination                            limited to the esophagus, stomach or duodenum, and                            adjacent structures Diagnosis Code(s):        --- Professional ---                           K31.89, Other diseases of stomach and duodenum CPT copyright 2018 American Medical Association. All rights reserved. The codes documented in this report are preliminary and upon coder review may  be revised to meet current compliance requirements. Milus Banister, MD 08/16/2018 12:27:03 PM This report has been signed electronically. Number of Addenda: 0

## 2018-08-16 NOTE — Discharge Instructions (Signed)
YOU HAD AN ENDOSCOPIC PROCEDURE TODAY: Refer to the procedure report and other information in the discharge instructions given to you for any specific questions about what was found during the examination. If this information does not answer your questions, please call [place] office at [phone] to clarify.   YOU SHOULD EXPECT: Some feelings of bloating in the abdomen. Passage of more gas than usual. Walking can help get rid of the air that was put into your GI tract during the procedure and reduce the bloating. If you had a lower endoscopy (such as a colonoscopy or flexible sigmoidoscopy) you may notice spotting of blood in your stool or on the toilet paper. Some abdominal soreness may be present for a day or two, also.  DIET: Your first meal following the procedure should be a light meal and then it is ok to progress to your normal diet. A half-sandwich or bowl of soup is an example of a good first meal. Heavy or fried foods are harder to digest and may make you feel nauseous or bloated. Drink plenty of fluids but you should avoid alcoholic beverages for 24 hours. If you had a esophageal dilation, please see attached instructions for diet.    ACTIVITY: Your care partner should take you home directly after the procedure. You should plan to take it easy, moving slowly for the rest of the day. You can resume normal activity the day after the procedure however YOU SHOULD NOT DRIVE, use power tools, machinery or perform tasks that involve climbing or major physical exertion for 24 hours (because of the sedation medicines used during the test).   SYMPTOMS TO REPORT IMMEDIATELY: A gastroenterologist can be reached at any hour. Please call [phone]  for any of the following symptoms:   Following upper endoscopy (EGD, EUS, ERCP, esophageal dilation) Vomiting of blood or coffee ground material  New, significant abdominal pain  New, significant chest pain or pain under the shoulder blades  Painful or  persistently difficult swallowing  New shortness of breath  Black, tarry-looking or red, bloody stools  FOLLOW UP:  If any biopsies were taken you will be contacted by phone or by letter within the next 1-3 weeks. Call [phone]  if you have not heard about the biopsies in 3 weeks.  Please also call with any specific questions about appointments or follow up tests.  

## 2018-08-16 NOTE — Progress Notes (Signed)
Dr. Ola Spurr by to see patient. Made aware of O2 sat on RA and that patient sounded wheezey when he got here though patient denies feeling SOB or wheezing. Dr. Ola Spurr examined patient and ordered a Duo neb prior to procedure.

## 2018-08-16 NOTE — H&P (Signed)
HPI: This is a 68 yo man   Chief complaint is ampullary adenoma recently noted on EGD Dr. Laural Golden during esophageal food impaction.  ROS: complete GI ROS as described in HPI, all other review negative.  Constitutional:  No unintentional weight loss   Past Medical History:  Diagnosis Date  . Closed fracture of right distal tibia   . Coronary artery disease    a. 1999 Inf MI;  b. 2003 CABGx6;  c. 2008 NSTEMI, occluded SVG to marginal and diagonal system. 2 DES placed to marginal system;  c. NSTEMI - 02/2012 Xience Xpedition DES to oS-OM and POBA to mid lesion;  c. 10/2013 NSTEMI/PCI: VG->OM 80 ISR, native OM 80/90 small, LIMA->LAD ok w/ 90 dLAD, D1 80, RIMA->RPL ok w/ 95 in RPL (2.5x14 Resolute DES), nl EF.  Marland Kitchen GERD (gastroesophageal reflux disease)   . Hyperlipidemia   . Hypertension   . Myocardial infarction ([place])    X 6  . S/P AAA repair   . Tobacco abuse     Past Surgical History:  Procedure Laterality Date  . ABDOMINAL AORTIC ANEURYSM REPAIR  2006  . APPENDECTOMY    . CORONARY ANGIOPLASTY WITH STENT PLACEMENT    . CORONARY ARTERY BYPASS GRAFT  2003  . ESOPHAGEAL DILATION  07/06/2018   Procedure: ESOPHAGEAL DILATION;  Surgeon: Rogene Houston, MD;  [facility];  Service: Endoscopy;;  . ESOPHAGOGASTRODUODENOSCOPY N/A 07/06/2018   Procedure: ESOPHAGOGASTRODUODENOSCOPY (EGD);  Surgeon: Rogene Houston, MD;  [facility];  Service: Endoscopy;  Laterality: N/A;  . FOREIGN BODY REMOVAL  07/06/2018   Procedure: FOREIGN BODY REMOVAL;  Surgeon: Rogene Houston, MD;  [facility];  Service: Endoscopy;;  . HERNIA REPAIR    . LEFT HEART CATHETERIZATION WITH CORONARY ANGIOGRAM N/A 03/05/2012   Procedure: LEFT HEART CATHETERIZATION WITH CORONARY ANGIOGRAM;  Surgeon: Hillary Bow, MD;  Location: [facility] CATH LAB;  Service: Cardiovascular;  Laterality: N/A;  . LEFT HEART CATHETERIZATION WITH CORONARY ANGIOGRAM N/A 10/23/2013   Procedure: LEFT HEART CATHETERIZATION  WITH CORONARY ANGIOGRAM;  Surgeon: Blane Ohara, MD;  Location: [facility] CATH LAB;  Service: Cardiovascular;  Laterality: N/A;  . LEFT HEART CATHETERIZATION WITH CORONARY/GRAFT ANGIOGRAM  10/22/2013   Procedure: LEFT HEART CATHETERIZATION WITH Beatrix Fetters;  Surgeon: Leonie Man, MD;  Location: [facility] CATH LAB;  Service: Cardiovascular;;  . PERCUTANEOUS CORONARY STENT INTERVENTION (PCI-S) N/A 03/07/2012   Procedure: PERCUTANEOUS CORONARY STENT INTERVENTION (PCI-S);  Surgeon: Sherren Mocha, MD;  Location: [facility] CATH LAB;  Service: Cardiovascular;  Laterality: N/A;  . PERCUTANEOUS CORONARY STENT INTERVENTION (PCI-S) Right 10/23/2013   Procedure: PERCUTANEOUS CORONARY STENT INTERVENTION (PCI-S);  Surgeon: Blane Ohara, MD;  Location: [facility] CATH LAB;  Service: Cardiovascular;  Laterality: Right;  . POLYPECTOMY  07/06/2018   Procedure: POLYPECTOMY;  Surgeon: Rogene Houston, MD;  [facility];  Service: Endoscopy;;  polyp at ampulla  . Right hand surgery    . TIBIA IM NAIL INSERTION Right 04/07/2016   Procedure: FIXATION OF RIGHT TIBIA FRACTURE;  Surgeon: Garald Balding, MD;  Location: [place];  Service: Orthopedics;  Laterality: Right;    Current Facility-Administered Medications  Medication Dose Route Frequency Provider Last Rate Last Dose  . 0.9 %  sodium chloride infusion   Intravenous Continuous Milus Banister, MD      . lactated ringers infusion   Intravenous Continuous Milus Banister, MD 10 mL/hr at 08/16/18 1055 1,000 mL at 08/16/18 1055  Allergies as of 07/23/2018 - Review Complete 07/06/2018  Allergen Reaction Noted  . No known allergies  04/06/2016    Family History  Problem Relation Age of Onset  . Coronary artery disease Other     Social History   Socioeconomic History  . Marital status: Legally Separated    Spouse name: Not on file  . Number of children: Not on file  . Years of education: Not on file  . Highest education level: Not on file   Occupational History  . Not on file  Social Needs  . Financial resource strain: Not on file  . Food insecurity:    Worry: Not on file    Inability: Not on file  . Transportation needs:    Medical: Not on file    Non-medical: Not on file  Tobacco Use  . Smoking status: Current Every Day Smoker    Packs/day: 1.00    Years: 53.00    Pack years: 53.00    Types: Cigarettes    Start date: 10/04/1964  . Smokeless tobacco: Never Used  Substance and Sexual Activity  . Alcohol use: Yes    Alcohol/week: 4.0 standard drinks    Types: 4 Shots of liquor per week  . Drug use: No  . Sexual activity: Yes  Lifestyle  . Physical activity:    Days per week: Not on file    Minutes per session: Not on file  . Stress: Not on file  Relationships  . Social connections:    Talks on phone: Not on file    Gets together: Not on file    Attends religious service: Not on file    Active member of club or organization: Not on file    Attends meetings of clubs or organizations: Not on file    Relationship status: Not on file  . Intimate partner violence:    Fear of current or ex partner: Not on file    Emotionally abused: Not on file    Physically abused: Not on file    Forced sexual activity: Not on file  Other Topics Concern  . Not on file  Social History Narrative   Disabled.  Former truck Software engineer. Divorced and remarried x 2           Physical Exam: BP 134/63   Pulse (!) 55   Temp 98.6 F (37 C) (Oral)   Resp 15   Ht 6' (1.829 m)   Wt 104.3 kg   SpO2 96%   BMI 31.19 kg/m  Constitutional: generally well-appearing Psychiatric: alert and oriented x3 Abdomen: soft, nontender, nondistended, no obvious ascites, no peritoneal signs, normal bowel sounds No peripheral edema noted in lower extremities  Assessment and plan: 68 y.o. male with ampullary adenoma  For upper EUS today  Please see the "Patient Instructions" section for addition details about the  plan.  Owens Loffler, MD [facility] 08/16/2018, 11:12 AM

## 2018-08-16 NOTE — Anesthesia Preprocedure Evaluation (Addendum)
Anesthesia Evaluation  Patient identified by MRN, date of birth, ID band Patient awake    Reviewed: Allergy & Precautions, NPO status , Patient's Chart, lab work & pertinent test results, reviewed documented beta blocker date and time   Airway Mallampati: II  TM Distance: >3 FB Neck ROM: Full    Dental   Pulmonary COPD, Current Smoker,    breath sounds clear to auscultation + wheezing      Cardiovascular hypertension, Pt. on medications and Pt. on home beta blockers + CAD, + Past MI, + CABG and + Peripheral Vascular Disease   Rhythm:Regular Rate:Normal     Neuro/Psych negative neurological ROS     GI/Hepatic Neg liver ROS, GERD  ,  Endo/Other  negative endocrine ROS  Renal/GU negative Renal ROS     Musculoskeletal   Abdominal   Peds  Hematology negative hematology ROS (+)   Anesthesia Other Findings   Reproductive/Obstetrics                            Lab Results  Component Value Date   WBC 10.1 04/07/2016   HGB 12.1 (L) 04/07/2016   HCT 37.0 (L) 04/07/2016   MCV 94.6 04/07/2016   PLT 240 04/07/2016    Anesthesia Physical Anesthesia Plan  ASA: IV  Anesthesia Plan: MAC   Post-op Pain Management:    Induction: Intravenous  PONV Risk Score and Plan: 0 and Propofol infusion and Treatment may vary due to age or medical condition  Airway Management Planned: Simple Face Mask and Nasal Cannula  Additional Equipment:   Intra-op Plan:   Post-operative Plan:   Informed Consent: I have reviewed the patients History and Physical, chart, labs and discussed the procedure including the risks, benefits and alternatives for the proposed anesthesia with the patient or authorized representative who has indicated his/her understanding and acceptance.       Plan Discussed with:   Anesthesia Plan Comments:         Anesthesia Quick Evaluation

## 2018-08-16 NOTE — Anesthesia Postprocedure Evaluation (Signed)
Anesthesia Post Note  Patient: Clifford Jones  Procedure(s) Performed: UPPER ENDOSCOPIC ULTRASOUND (EUS) RADIAL (N/A )     Patient location during evaluation: PACU Anesthesia Type: MAC Level of consciousness: awake and alert Pain management: pain level controlled Vital Signs Assessment: post-procedure vital signs reviewed and stable Respiratory status: spontaneous breathing, nonlabored ventilation, respiratory function stable and patient connected to nasal cannula oxygen Cardiovascular status: stable and blood pressure returned to baseline Postop Assessment: no apparent nausea or vomiting Anesthetic complications: no    Last Vitals:  Vitals:   08/16/18 1220 08/16/18 1230  BP: (!) 143/67 112/62  Pulse: 65 70  Resp: 20 16  Temp:    SpO2: 95% 93%    Last Pain:  Vitals:   08/16/18 1219  TempSrc: Oral  PainSc: 0-No pain                 Tiajuana Amass

## 2018-08-16 NOTE — Transfer of Care (Signed)
Immediate Anesthesia Transfer of Care Note  Patient: Clifford Jones  Procedure(s) Performed: UPPER ENDOSCOPIC ULTRASOUND (EUS) RADIAL (N/A )  Patient Location: PACU  Anesthesia Type:MAC  Level of Consciousness: sedated  Airway & Oxygen Therapy: Patient Spontanous Breathing and Patient connected to face mask oxygen  Post-op Assessment: Report given to RN and Post -op Vital signs reviewed and stable  Post vital signs: Reviewed and stable  Last Vitals:  Vitals Value Taken Time  BP    Temp    Pulse    Resp    SpO2      Last Pain:  Vitals:   08/16/18 1035  TempSrc: Oral  PainSc: 0-No pain         Complications: No apparent anesthesia complications

## 2018-08-17 ENCOUNTER — Encounter (HOSPITAL_COMMUNITY): Payer: Self-pay | Admitting: Gastroenterology

## 2018-08-17 ENCOUNTER — Telehealth: Payer: Self-pay

## 2018-08-17 NOTE — Telephone Encounter (Signed)
appt with Dr Rush Landmark on 4/3 at 9:45 am pt aware via My Chart and letter.

## 2018-08-17 NOTE — Telephone Encounter (Signed)
-----   Message from Irving Copas., MD sent at 08/16/2018 11:13 PM EST ----- Thanks Linna Hoff again for getting this done for the patient. I reviewed your images and do think it will be worthwhile to attempt ampullectomy. I had a chance to see him briefly. I'll see him in clinic. Venise Ellingwood, please work on scheduling patient for clinic visit 30 minutes in next 3-4 weeks. Gabe ----- Message ----- From: Milus Banister, MD Sent: 08/16/2018  12:29 PM EST To: Rogene Houston, MD, #  Wilnette Kales,  Just completed EUS examination. See full report in Epic.  Thanks   Approximately 1.2cm polyp located at the major papilla. The ampullary orifice was at the most caudal aspect of the polypoid lesion (intermittent bile seen draining from that point) see images. The ampullary polyp correlates with a hypoechoic lesion by EUS that measured 1.5cm in maximum dimension and was confined to the mucosa, deep mucosa layers of the duodenum. The polyp does not appear to involve the main pancreatic duct or CBD.

## 2018-09-17 ENCOUNTER — Other Ambulatory Visit: Payer: Self-pay

## 2018-09-17 ENCOUNTER — Ambulatory Visit (INDEPENDENT_AMBULATORY_CARE_PROVIDER_SITE_OTHER): Payer: Medicare HMO | Admitting: Gastroenterology

## 2018-09-17 DIAGNOSIS — R198 Other specified symptoms and signs involving the digestive system and abdomen: Secondary | ICD-10-CM

## 2018-09-17 DIAGNOSIS — D135 Benign neoplasm of extrahepatic bile ducts: Secondary | ICD-10-CM | POA: Diagnosis not present

## 2018-09-17 DIAGNOSIS — D132 Benign neoplasm of duodenum: Secondary | ICD-10-CM

## 2018-09-17 NOTE — Progress Notes (Signed)
[place] VISIT   Primary Care Provider Rory Percy, MD [address] [place] [address] [phone]  Referring Provider Dr. Laural Golden  Patient Profile: Clifford Jones is a 68 y.o. male with a pmh significant for CAD status post CABG, hypertension, hyperlipidemia, status post AAA repair, tobacco use, GERD, Schatzki ring, ampullary adenoma of the duodenum.  The patient presents to the [facility] for an evaluation and management of problem(s) noted below:  Problem List 1. Ampullary adenoma   2. Abnormal findings on esophagogastroduodenoscopy (EGD)   3. Duodenal adenoma     History of Present Illness: Due to the COVID-19 Pandemic, this service was provided via telemedicine.  Interactive audio and video telecommunications were attempted between this provider and patient, however failed, patient did not have access to video capability (flip phone and no computer access at home) and thus to provide timely and excellent care, we continued and completed visit with audio only. The patient was located at home. The provider was located in the office. The patient did consent to this visit and is aware of charges through their insurance. The patient was referred by Dr. Laural Golden. Other persons participating in this telemedicine service were none. Time spent on visit was 30 minutes.  The patient is followed by Dr. Laural Golden and recently underwent an upper endoscopy in January of this year for evaluation of a food impaction.  After removal of the esophagus a Schatzki ring was found as well as a hiatal hernia.  The Schatzki ring was dilated.  Within the duodenum and ampullary lesion was found and this was biopsied and came back showing evidence of low-grade dysplasia with an ampullary adenoma.  My colleague Dr. Ardis Hughs performed an endoscopic ultrasound in February to further evaluate this lesion.  The results are as noted below.  In brief an approximately 1.2 cm  polyp was found at the major papilla with bile draining.  The lesion measured approximately 1.5 cm in maximum dimension and was felt to be confined to the mucosa and deep mucosa of the duodenum did not seem to involve the pancreatic duct or the CBD extensively.  The pancreas itself showed no masses or signs of chronic pancreatitis and there was a nondilated PD and CBD that was found.  The patient has a visit today in order to describe and discuss the next steps in evaluation and management of his ampullary adenoma.  The patient has currently been doing well since his endoscopy with dilation and describes no significant issues of dysphagia.  He denies any issues of abdominal pain or discomfort.  He denies any jaundice or obstructive symptoms at this point in time.  He has never been told he had this type of lesion previously.  He wonders about the risks of this procedure as it has been told him that this can be a dangerous resection of a polyp.  GI Review of Systems Positive as above Negative for odynophagia, nausea, vomiting, weight loss, change in bowel habits, melena, hematochezia  Review of Systems General: Denies fevers/chills HEENT: Denies oral lesions Cardiovascular: Denies chest pain Pulmonary: Shortness of breath at baseline Gastroenterological: See HPI Genitourinary: Denies darkened urine Hematological: Denies easy bruising/bleeding Dermatological: Denies jaundice Psychological: Mood is stable   Medications Current Outpatient Medications  Medication Sig Dispense Refill   acetaminophen (TYLENOL) 500 MG tablet Take 1,000 mg by mouth every 6 (six) hours as needed for moderate pain or headache.     albuterol (PROVENTIL) (2.5 MG/3ML) 0.083% nebulizer  solution Take 2.5 mg by nebulization every 6 (six) hours as needed for wheezing or shortness of breath.     amLODipine (NORVASC) 5 MG tablet TAKE ONE TABLET BY MOUTH EVERY DAY (Patient taking differently: Take 5 mg by mouth daily. ) 15  tablet 0   aspirin 81 MG EC tablet Take 1 tablet (81 mg total) by mouth daily. 30 tablet 11   carvedilol (COREG) 6.25 MG tablet TAKE ONE TABLET BY MOUTH TWICE DAILY (Patient taking differently: Take 6.25 mg by mouth 2 (two) times daily. ) 30 tablet 0   lisinopril (PRINIVIL,ZESTRIL) 20 MG tablet TAKE ONE TABLET BY MOUTH EVERY DAY (Patient taking differently: Take 20 mg by mouth daily. ) 15 tablet 0   nitroGLYCERIN (NITROSTAT) 0.4 MG SL tablet Place 1 tablet (0.4 mg total) under the tongue every 5 (five) minutes x 3 doses as needed for chest pain. 25 tablet 4   pantoprazole (PROTONIX) 40 MG tablet TAKE 1 TABLET BY MOUTH TWICE DAILY (Patient taking differently: Take 40 mg by mouth 2 (two) times daily. ) 60 tablet 3   RANEXA 1000 MG SR tablet TAKE ONE TABLET BY MOUTH TWICE DAILY (Patient taking differently: Take 1,000 mg by mouth 2 (two) times daily. ) 30 tablet 0   rosuvastatin (CRESTOR) 40 MG tablet TAKE ONE TABLET BY MOUTH EVERY DAY (Patient taking differently: Take 40 mg by mouth daily. ) 15 tablet 0   No current facility-administered medications for this visit.     Allergies No Known Allergies  Histories Past Medical History:  Diagnosis Date   Closed fracture of right distal tibia    Coronary artery disease    a. 1999 Inf MI;  b. 2003 CABGx6;  c. 2008 NSTEMI, occluded SVG to marginal and diagonal system. 2 DES placed to marginal system;  c. NSTEMI - 02/2012 Xience Xpedition DES to oS-OM and POBA to mid lesion;  c. 10/2013 NSTEMI/PCI: VG->OM 80 ISR, native OM 80/90 small, LIMA->LAD ok w/ 90 dLAD, D1 80, RIMA->RPL ok w/ 95 in RPL (2.5x14 Resolute DES), nl EF.   GERD (gastroesophageal reflux disease)    Hyperlipidemia    Hypertension    Myocardial infarction ([place])    X 6   S/P AAA repair    Tobacco abuse    Past Surgical History:  Procedure Laterality Date   ABDOMINAL AORTIC ANEURYSM REPAIR  2006   APPENDECTOMY     CORONARY ANGIOPLASTY WITH STENT PLACEMENT      CORONARY ARTERY BYPASS GRAFT  2003   ESOPHAGEAL DILATION  07/06/2018   Procedure: ESOPHAGEAL DILATION;  Surgeon: Rogene Houston, MD;  [facility];  Service: Endoscopy;;   ESOPHAGOGASTRODUODENOSCOPY N/A 07/06/2018   Procedure: ESOPHAGOGASTRODUODENOSCOPY (EGD);  Surgeon: Rogene Houston, MD;  [facility];  Service: Endoscopy;  Laterality: N/A;   ESOPHAGOGASTRODUODENOSCOPY N/A 08/16/2018   Procedure: ESOPHAGOGASTRODUODENOSCOPY (EGD);  Surgeon: Milus Banister, MD;  Location: [facility];  Service: Endoscopy;  Laterality: N/A;   EUS N/A 08/16/2018   Procedure: UPPER ENDOSCOPIC ULTRASOUND (EUS) RADIAL;  Surgeon: Milus Banister, MD;  Location: [facility];  Service: Endoscopy;  Laterality: N/A;   FOREIGN BODY REMOVAL  07/06/2018   Procedure: FOREIGN BODY REMOVAL;  Surgeon: Rogene Houston, MD;  [facility];  Service: Endoscopy;;   HERNIA REPAIR     LEFT HEART CATHETERIZATION WITH CORONARY ANGIOGRAM N/A 03/05/2012   Procedure: LEFT HEART CATHETERIZATION WITH CORONARY ANGIOGRAM;  Surgeon: Hillary Bow, MD;  Location: [facility] CATH LAB;  Service: Cardiovascular;  Laterality: N/A;   LEFT HEART CATHETERIZATION WITH CORONARY ANGIOGRAM N/A 10/23/2013   Procedure: LEFT HEART CATHETERIZATION WITH CORONARY ANGIOGRAM;  Surgeon: Blane Ohara, MD;  Location: [facility] CATH LAB;  Service: Cardiovascular;  Laterality: N/A;   LEFT HEART CATHETERIZATION WITH CORONARY/GRAFT ANGIOGRAM  10/22/2013   Procedure: LEFT HEART CATHETERIZATION WITH Beatrix Fetters;  Surgeon: Leonie Man, MD;  Location: [facility] CATH LAB;  Service: Cardiovascular;;   PERCUTANEOUS CORONARY STENT INTERVENTION (PCI-S) N/A 03/07/2012   Procedure: PERCUTANEOUS CORONARY STENT INTERVENTION (PCI-S);  Surgeon: Sherren Mocha, MD;  Location: [facility] CATH LAB;  Service: Cardiovascular;  Laterality: N/A;   PERCUTANEOUS CORONARY STENT INTERVENTION (PCI-S) Right 10/23/2013   Procedure: PERCUTANEOUS CORONARY STENT  INTERVENTION (PCI-S);  Surgeon: Blane Ohara, MD;  Location: [facility] CATH LAB;  Service: Cardiovascular;  Laterality: Right;   POLYPECTOMY  07/06/2018   Procedure: POLYPECTOMY;  Surgeon: Rogene Houston, MD;  [facility];  Service: Endoscopy;;  polyp at ampulla   Right hand surgery     TIBIA IM NAIL INSERTION Right 04/07/2016   Procedure: FIXATION OF RIGHT TIBIA FRACTURE;  Surgeon: Garald Balding, MD;  Location: [place];  Service: Orthopedics;  Laterality: Right;   Social History   Socioeconomic History   Marital status: Legally Separated    Spouse name: Not on file   Number of children: Not on file   Years of education: Not on file   Highest education level: Not on file  Occupational History   Not on file  Social Needs   Financial resource strain: Not on file   Food insecurity:    Worry: Not on file    Inability: Not on file   Transportation needs:    Medical: Not on file    Non-medical: Not on file  Tobacco Use   Smoking status: Current Every Day Smoker    Packs/day: 1.00    Years: 53.00    Pack years: 53.00    Types: Cigarettes    Start date: 10/04/1964   Smokeless tobacco: Never Used  Substance and Sexual Activity   Alcohol use: Yes    Alcohol/week: 4.0 standard drinks    Types: 4 Shots of liquor per week   Drug use: No   Sexual activity: Yes  Lifestyle   Physical activity:    Days per week: Not on file    Minutes per session: Not on file   Stress: Not on file  Relationships   Social connections:    Talks on phone: Not on file    Gets together: Not on file    Attends religious service: Not on file    Active member of club or organization: Not on file    Attends meetings of clubs or organizations: Not on file    Relationship status: Not on file   Intimate partner violence:    Fear of current or ex partner: Not on file    Emotionally abused: Not on file    Physically abused: Not on file    Forced sexual activity: Not on file   Other Topics Concern   Not on file  Social History Narrative   Disabled.  Former truck Software engineer. Divorced and remarried x 2         Family History  Problem Relation Age of Onset   Coronary artery disease Other    Colon cancer Neg Hx    Esophageal cancer Neg Hx    Inflammatory bowel disease Neg Hx  Liver disease Neg Hx   °• Pancreatic cancer Neg Hx   °• Rectal cancer Neg Hx   °• Stomach cancer Neg Hx   ° °I have reviewed his medical, social, and family history in detail and updated the electronic medical record as necessary.  ° ° °PHYSICAL EXAMINATION  °Telehealth visit ° ° °REVIEW OF DATA  °I reviewed the following data at the time of this encounter: ° °GI Procedures and Studies  °February 2020 EUS °- Approximately 1.2 cm polyp located at the major papilla. The ampullary orifice was at the °most caudal aspect of the polypoid lesion (intermittent bile seen draining from that point) °see images. The ampullary polyp correlates with a hypoechoic lesion by EUS that measured °1.5 cm in maximum dimension and was confined to the mucosa, deep mucosa layers of the °duodenum. The polyp does not appear to involve the main pancreatic duct or CBD. ° °January 2020 EGD °- Food was found in the esophagus. Removal was successful. °- Moderate Schatzki ring. Dilated. °- 3 cm hiatal hernia. °- Normal stomach. °- Normal duodenal bulb. °- A single duodenal polyp. Biopsied. ° °Laboratory Studies  °Reviewed in epic ° °Imaging Studies  °No relevant studies to review ° ° °ASSESSMENT  °Mr. Sago is a 67 y.o. male with a pmh significant for CAD status post CABG, hypertension, hyperlipidemia, status post AAA repair, tobacco use, GERD, Schatzki ring, ampullary adenoma of the duodenum.  The patient is seen today for evaluation and management of: ° °1. Ampullary adenoma   °2. Abnormal findings on esophagogastroduodenoscopy (EGD)   °3. Duodenal adenoma   ° °The patient is hemodynamically and clinically  stable with the findings of an ampullary adenoma.  Based upon the description and endoscopic pictures and ultrasound imaging I do feel that it is reasonable to pursue an Advanced Polypectomy attempt of the polyp/lesion and perform an endoscopic papillectomy.  We discussed some of the techniques of papillectomy via EMR with the role of ERCP to attempt PD and CBD cannulation and stent placement for protective purposes.  The risks and benefits of endoscopic evaluation were discussed with the patient; these include but are not limited to the risk of perforation, infection, bleeding, missed lesions, lack of diagnosis, severe illness requiring hospitalization, as well as anesthesia and sedation related illnesses.  The risks of post-ERCP pancreatitis (while usually mild can be severe and even life threatening).  I did offer, a referral to surgery as well to discuss consideration of a surgical intervention prior to finalizing decision for attempt at endoscopic removal; the patient has deferred on this.  If, after attempt at removal of the polyp, it is found that the patient has a complication or that an invasive lesion or malignant lesion is found, or that the polyp continues to recur, the patient is aware and understands that a surgery may still be indicated/required.  I will need to get some labs as well as see what his liver tests are in the coming weeks to anticipate them to be normal because they have not been checked in years.  As long as the test do not show something significant then we will plan to attempt this procedure in the next 4 to 8 weeks based on the COVID-19 pandemic.  All patient questions were answered, to the best of my ability, and the patient agrees to the aforementioned plan of action with follow-up as indicated. ° ° °PLAN  °Laboratories as outlined below °Proceed with attempt at endoscopic ampullectomy in next 4 to 8   weeks We will consider observation visit after versus close monitoring at home  post procedure with attempt at PD and CBD stenting at time of procedure to decrease risks of complications   Orders Placed This Encounter  Procedures   Comp Met (CMET)   CBC   Lipase   INR/PT    New Prescriptions   No medications on file   Modified Medications   No medications on file    Planned Follow Up: No follow-ups on file.   Justice Britain, MD [facility] Advanced [facility]oscopy Office # [phone]

## 2018-09-17 NOTE — Patient Instructions (Signed)
Your provider has requested that you go to the basement level for labs in 1-2 weeks. Press "B" on the elevator. The lab is located at the first door on the left as you exit the elevator.  If you are age 66 or older, your body mass index should be between 23-30. Your There is no height or weight on file to calculate BMI. If this is out of the aforementioned range listed, please consider follow up with your Primary Care Provider.  If you are age 82 or younger, your body mass index should be between 19-25. Your There is no height or weight on file to calculate BMI. If this is out of the aformentioned range listed, please consider follow up with your Primary Care Provider.   It has been recommended to you by your physician that you have a(n) ERCP(3hr) completed. Per your request, we did not schedule the procedure(s) today. We will schedule this for 6-8 weeks once we are able to schedule at hospital. Please contact our office at [phone] should you decide to have the procedure completed.  Thank you for choosing me and [facility].  Dr. Rush Landmark

## 2018-09-18 ENCOUNTER — Encounter: Payer: Self-pay | Admitting: Gastroenterology

## 2018-09-18 DIAGNOSIS — R198 Other specified symptoms and signs involving the digestive system and abdomen: Secondary | ICD-10-CM | POA: Insufficient documentation

## 2018-09-18 DIAGNOSIS — D132 Benign neoplasm of duodenum: Secondary | ICD-10-CM | POA: Insufficient documentation

## 2018-09-19 NOTE — Telephone Encounter (Signed)
Yes, they can be drawn by PCP and should be within 4-weeks of our currently scheduled procedure. Thank you. GM

## 2018-09-21 ENCOUNTER — Ambulatory Visit: Payer: Self-pay | Admitting: Gastroenterology

## 2018-11-28 DIAGNOSIS — Z23 Encounter for immunization: Secondary | ICD-10-CM | POA: Diagnosis not present

## 2018-11-28 DIAGNOSIS — I10 Essential (primary) hypertension: Secondary | ICD-10-CM | POA: Diagnosis not present

## 2018-11-28 DIAGNOSIS — Z6836 Body mass index (BMI) 36.0-36.9, adult: Secondary | ICD-10-CM | POA: Diagnosis not present

## 2018-11-28 DIAGNOSIS — K210 Gastro-esophageal reflux disease with esophagitis: Secondary | ICD-10-CM | POA: Diagnosis not present

## 2018-11-28 DIAGNOSIS — Z0000 Encounter for general adult medical examination without abnormal findings: Secondary | ICD-10-CM | POA: Diagnosis not present

## 2018-11-28 DIAGNOSIS — J449 Chronic obstructive pulmonary disease, unspecified: Secondary | ICD-10-CM | POA: Diagnosis not present

## 2018-11-28 DIAGNOSIS — Z955 Presence of coronary angioplasty implant and graft: Secondary | ICD-10-CM | POA: Diagnosis not present

## 2018-12-17 ENCOUNTER — Telehealth: Payer: Self-pay

## 2018-12-17 ENCOUNTER — Other Ambulatory Visit: Payer: Self-pay

## 2018-12-17 DIAGNOSIS — D135 Benign neoplasm of extrahepatic bile ducts: Secondary | ICD-10-CM

## 2018-12-17 DIAGNOSIS — D132 Benign neoplasm of duodenum: Secondary | ICD-10-CM

## 2018-12-17 DIAGNOSIS — R198 Other specified symptoms and signs involving the digestive system and abdomen: Secondary | ICD-10-CM

## 2018-12-17 DIAGNOSIS — K229 Disease of esophagus, unspecified: Secondary | ICD-10-CM

## 2018-12-17 NOTE — Telephone Encounter (Signed)
Pt scheduled for 01/23/2019 [facility] @ 9:45am. Pt will also be tested prior to his procedure on 01/23/2019 for Covid-19. Pt is aware and understands that he must be quarantine from the day he is tested for Covid-19 until his procedure date. Instructions for ERCP have been mailed to pt.

## 2019-01-15 ENCOUNTER — Telehealth: Payer: Self-pay

## 2019-01-15 NOTE — Telephone Encounter (Signed)
Message sent to myself to set pt up for procedure in about 8 weeks.

## 2019-01-15 NOTE — Telephone Encounter (Signed)
-----   Message from Timothy Lasso, RN sent at 12/04/2018  3:50 PM EDT ----- Please move forward with getting patient on schedule as an ERCP/Ampullectomy 2.5 hour case in next 8-weeks.

## 2019-01-18 ENCOUNTER — Telehealth: Payer: Self-pay

## 2019-01-18 DIAGNOSIS — D135 Benign neoplasm of extrahepatic bile ducts: Secondary | ICD-10-CM

## 2019-01-18 NOTE — Telephone Encounter (Signed)
Please have him set up for a MRI at his ability and timing. Please let Mr. Memoli know that we will need to reschedule his procedure. I will give him a call today or tomorrow to discuss things. I know he has been waiting and we had put this, but I feel I want to give him the best opportunity to have the information that I need to help him as best I can. Please apologize if he is upset, but I'll discuss things with he and his family. Thank you. GM

## 2019-01-18 NOTE — Telephone Encounter (Signed)
Dr Rush Landmark I called [facility] hospital and they are able to get the pt in 8/4 at 8 am for MRI/MRCP.  The pt is aware and he is still on for Wed

## 2019-01-18 NOTE — Telephone Encounter (Signed)
Dr Rush Landmark I have called [facility], [facility] and [facility] and no one has any availability for MRI/MRCP or CT scans before next Friday.  Please advise

## 2019-01-18 NOTE — Telephone Encounter (Signed)
Awesome. Thanks. GM 

## 2019-01-18 NOTE — Telephone Encounter (Signed)
-----   Message from Irving Copas., MD sent at 01/17/2019 11:24 PM EDT ----- Regarding: Patient's procedure next week Clifford Jones,I have been thinking about our patient's procedure next week on Wednesday.We should continue to move forward with having this completed. With that being said, we should ideally try and get a good cross-sectional image of the pancreas and bile duct prior to the upcoming procedure.  I would like to try and have the patient do an MRI/MRCP before his Wednesday procedure so that I can dictate and ensure that he does not have pancreatic diabetes him.  If he cannot get a MRI for any reason then he should have a CT abdomen/pelvis pancreas protocol.This needs to be obtained before his upcoming procedure so that we can have as much information as we can.Once this is been completed I will also call the patient early next week to discuss with him and his family the results and subsequently go over our procedure plan with him for Wednesday.Please let me know if any other issues arise but we do need to get this imaging.Thank you.GM

## 2019-01-19 ENCOUNTER — Other Ambulatory Visit (HOSPITAL_COMMUNITY)
Admission: RE | Admit: 2019-01-19 | Discharge: 2019-01-19 | Disposition: A | Discharge status: Home / Self Care | Payer: Medicare HMO | Source: Ambulatory Visit | Attending: Gastroenterology | Admitting: Gastroenterology

## 2019-01-19 ENCOUNTER — Other Ambulatory Visit (HOSPITAL_COMMUNITY): Payer: Medicare HMO

## 2019-01-19 DIAGNOSIS — Z20828 Contact with and (suspected) exposure to other viral communicable diseases: Secondary | ICD-10-CM | POA: Diagnosis not present

## 2019-01-19 DIAGNOSIS — Z01812 Encounter for preprocedural laboratory examination: Secondary | ICD-10-CM | POA: Diagnosis not present

## 2019-01-19 LAB — SARS CORONAVIRUS 2 (TAT 6-24 HRS): SARS Coronavirus 2: NEGATIVE

## 2019-01-22 ENCOUNTER — Other Ambulatory Visit: Payer: Self-pay

## 2019-01-22 ENCOUNTER — Ambulatory Visit (HOSPITAL_COMMUNITY)
Admission: RE | Admit: 2019-01-22 | Discharge: 2019-01-22 | Disposition: A | Discharge status: Home / Self Care | Payer: Medicare HMO | Source: Ambulatory Visit | Attending: Gastroenterology | Admitting: Gastroenterology

## 2019-01-22 ENCOUNTER — Other Ambulatory Visit: Payer: Self-pay | Admitting: Gastroenterology

## 2019-01-22 ENCOUNTER — Encounter (HOSPITAL_COMMUNITY): Payer: Self-pay | Admitting: Physician Assistant

## 2019-01-22 ENCOUNTER — Encounter (HOSPITAL_COMMUNITY): Payer: Self-pay

## 2019-01-22 ENCOUNTER — Encounter (HOSPITAL_COMMUNITY): Payer: Self-pay | Admitting: *Deleted

## 2019-01-22 DIAGNOSIS — D135 Benign neoplasm of extrahepatic bile ducts: Secondary | ICD-10-CM

## 2019-01-22 LAB — POCT I-STAT CREATININE: Creatinine, Ser: 0.9 mg/dL (ref 0.61–1.24)

## 2019-01-22 NOTE — Progress Notes (Addendum)
Clifford Jones denies chest pain or shortness of breath.  Patient Covid 19 test was negative on   01/19/2019; patient has been in quarantine at home alone.  I instructed patient that he and his daughter, who is bringing him to the hospital should wear masks in am. Clifford Jones has an extensive cardiac history,MI's, CABG, Coronary stents; he only goes to cardiologist is he has symptoms.  Patient was last seen at [facility] in 2017, he was supposed to follow up in 6 months. Clifford Jones last had an EKG 3/ 2019, Dr Alla German name is listed in care everywhere, but I did not find notes. I asked Karoline Caldwell, PA-C to review chart.

## 2019-01-22 NOTE — Progress Notes (Signed)
Case: [id number] Date/Time: 01/23/19 1045   Procedure: ENDOSCOPIC RETROGRADE CHOLANGIOPANCREATOGRAPHY (ERCP) WITH PROPOFOL (N/A ) - AMPULLECTOMY   Anesthesia type: General   Pre-op diagnosis: Neoplasma of ampulla, Duodenal adenoma   Location: [facility] / [place]   Surgeon: Irving Copas., MD      DISCUSSION: 68 yo male current smoker. He has a h/o non-STEMI and PCI with DES to the native RPLA via the [place] graft. He has residual VG->OM, native OM, distal LAD, and diagonal disease, which is being medically managed.  He also has a history of hypertension, tobacco abuse, hyperlipidemia, and peripheral vascular disease and is status post AAA repair (2206). Initial CABG was in 2003.  Again hospitalized for non-STEMI in September 2016. Nuclear myocardial perfusion study demonstrated significant myocardial scar with no evidence of ischemia.  Echocardiogram 03/02/15 showed normal left ventricular systolic function with regional wall motion abnormalities, LVEF 55-60%, moderate left atrial dilatation, and mildly reduced right ventricular systolic function.  Last seen by Dr. Bronson Ing in 2017, at that time recommended 6 month f/u.  Discussed case with Dr. Roanna Banning. He advised pt needs cardiac clearance prior to surgery. Called Dr. Donneta Romberg office and let scheduler know. They were already planning to reschedule because pt was unable to have MRI today due to claustrophobia. Will need clearance before reposting case.   VS: Ht 6' (1.829 m)   Wt 106.6 kg   BMI 31.87 kg/m   PROVIDERS: Rory Percy, MD is PCP  Kate Sable, MD is Cardiologist  LABS: Will need DOS labs  Labs Reviewed - No data to display   IMAGES:   EKG: 04/07/2016: Sinus bradycardia with sinus arrhythmia. Rate 56. Nonspecific ST and T wave abnormality. No significant change since last tracing  CV: TTE 03/02/2015: - Left ventricle: The cavity size was normal. There was mild   concentric  hypertrophy. Systolic function was normal. The   estimated ejection fraction was in the range of 55% to 60%.   Possible hypokinesis of the basalinferior myocardium. Marked   bradycardia limits evaluation of LV diastolic function. - Aortic valve: There was trivial regurgitation. - Left atrium: The atrium was moderately dilated. - Right ventricle: Systolic function was mildly reduced.  Nuclear stress 03/02/2015:  There was no ST segment deviation noted during stress.  T wave inversion of 1 mm was noted during stress in the I, II, aVF, V5 and V6 leads. T wave inversion persisted.  Defect 1: There is a medium defect of severe severity present in the apical anterior, apical inferior and apex location.  Findings consistent with prior myocardial infarction. There is no evidence of ischemia.  This is an intermediate risk study.  Nuclear stress EF: 39%.  Past Medical History:  Diagnosis Date  . Claustrophobia   . Closed fracture of right distal tibia   . Coronary artery disease    a. 1999 Inf MI;  b. 2003 CABGx6;  c. 2008 NSTEMI, occluded SVG to marginal and diagonal system. 2 DES placed to marginal system;  c. NSTEMI - 02/2012 Xience Xpedition DES to oS-OM and POBA to mid lesion;  c. 10/2013 NSTEMI/PCI: VG->OM 80 ISR, native OM 80/90 small, LIMA->LAD ok w/ 90 dLAD, D1 80, RIMA->RPL ok w/ 95 in RPL (2.5x14 Resolute DES), nl EF.  Marland Kitchen GERD (gastroesophageal reflux disease)   . Hyperlipidemia   . Hypertension   . Myocardial infarction ([place])    X 6  . S/P AAA repair   . Tobacco abuse     Past  Surgical History:  Procedure Laterality Date  . ABDOMINAL AORTIC ANEURYSM REPAIR  2006  . APPENDECTOMY    . CORONARY ANGIOPLASTY WITH STENT PLACEMENT    . CORONARY ARTERY BYPASS GRAFT  2003  . ESOPHAGEAL DILATION  07/06/2018   Procedure: ESOPHAGEAL DILATION;  Surgeon: Rogene Houston, MD;  [facility];  Service: Endoscopy;;  . ESOPHAGOGASTRODUODENOSCOPY N/A 07/06/2018   Procedure:  ESOPHAGOGASTRODUODENOSCOPY (EGD);  Surgeon: Rogene Houston, MD;  [facility];  Service: Endoscopy;  Laterality: N/A;  . ESOPHAGOGASTRODUODENOSCOPY N/A 08/16/2018   Procedure: ESOPHAGOGASTRODUODENOSCOPY (EGD);  Surgeon: Milus Banister, MD;  Location: [facility];  Service: Endoscopy;  Laterality: N/A;  . EUS N/A 08/16/2018   Procedure: UPPER ENDOSCOPIC ULTRASOUND (EUS) RADIAL;  Surgeon: Milus Banister, MD;  Location: [facility];  Service: Endoscopy;  Laterality: N/A;  . FOREIGN BODY REMOVAL  07/06/2018   Procedure: FOREIGN BODY REMOVAL;  Surgeon: Rogene Houston, MD;  [facility];  Service: Endoscopy;;  . HERNIA REPAIR     umbicical  . LEFT HEART CATHETERIZATION WITH CORONARY ANGIOGRAM N/A 03/05/2012   Procedure: LEFT HEART CATHETERIZATION WITH CORONARY ANGIOGRAM;  Surgeon: Hillary Bow, MD;  Location: [facility] CATH LAB;  Service: Cardiovascular;  Laterality: N/A;  . LEFT HEART CATHETERIZATION WITH CORONARY ANGIOGRAM N/A 10/23/2013   Procedure: LEFT HEART CATHETERIZATION WITH CORONARY ANGIOGRAM;  Surgeon: Blane Ohara, MD;  Location: [facility] CATH LAB;  Service: Cardiovascular;  Laterality: N/A;  . LEFT HEART CATHETERIZATION WITH CORONARY/GRAFT ANGIOGRAM  10/22/2013   Procedure: LEFT HEART CATHETERIZATION WITH Beatrix Fetters;  Surgeon: Leonie Man, MD;  Location: [facility] CATH LAB;  Service: Cardiovascular;;  . PERCUTANEOUS CORONARY STENT INTERVENTION (PCI-S) N/A 03/07/2012   Procedure: PERCUTANEOUS CORONARY STENT INTERVENTION (PCI-S);  Surgeon: Sherren Mocha, MD;  Location: [facility] CATH LAB;  Service: Cardiovascular;  Laterality: N/A;  . PERCUTANEOUS CORONARY STENT INTERVENTION (PCI-S) Right 10/23/2013   Procedure: PERCUTANEOUS CORONARY STENT INTERVENTION (PCI-S);  Surgeon: Blane Ohara, MD;  Location: [facility] CATH LAB;  Service: Cardiovascular;  Laterality: Right;  . POLYPECTOMY  07/06/2018   Procedure: POLYPECTOMY;  Surgeon: Rogene Houston, MD;  [facility];   Service: Endoscopy;;  polyp at ampulla  . Right hand surgery    . TIBIA IM NAIL INSERTION Right 04/07/2016   Procedure: FIXATION OF RIGHT TIBIA FRACTURE;  Surgeon: Garald Balding, MD;  Location: [place];  Service: Orthopedics;  Laterality: Right;    MEDICATIONS: No current facility-administered medications for this encounter.    Marland Kitchen acetaminophen (TYLENOL) 500 MG tablet  . albuterol (PROVENTIL) (2.5 MG/3ML) 0.083% nebulizer solution  . amLODipine (NORVASC) 5 MG tablet  . aspirin 81 MG EC tablet  . carvedilol (COREG) 6.25 MG tablet  . lisinopril (PRINIVIL,ZESTRIL) 20 MG tablet  . nitroGLYCERIN (NITROSTAT) 0.4 MG SL tablet  . pantoprazole (PROTONIX) 40 MG tablet  . RANEXA 1000 MG SR tablet  . rosuvastatin (CRESTOR) 40 MG tablet    Johnathan, [facility] Short Stay Center/Anesthesiology Phone [phone] 01/22/2019 10:36 AM

## 2019-01-22 NOTE — Telephone Encounter (Signed)
[facility] informed that pt's daughter, Leveda Anna reported that pt was unable to complete MRI as he is claustrophobic.

## 2019-01-22 NOTE — Telephone Encounter (Signed)
Dr Rush Landmark the pt was unable to complete the MRI MRCP today due to claustrophobia.  I also received a call from anesthesia stating he has a cardiac history including CABG and will need cardiac clearance prior to any procedures.  He has not followed up with cardiology as requested.  I have canceled the case for tomorrow.

## 2019-01-22 NOTE — Telephone Encounter (Signed)
The pt has been advised that the procedure has been cancelled and we discussed the need for him to be seen by his cardiologist.  He states he was last seen by a cardiologist in [place] and will call that office to make a follow up appt.

## 2019-01-23 ENCOUNTER — Ambulatory Visit (HOSPITAL_COMMUNITY): Admission: RE | Admit: 2019-01-23 | Payer: Medicare HMO | Source: Home / Self Care | Admitting: Gastroenterology

## 2019-01-23 HISTORY — DX: Claustrophobia: F40.240

## 2019-01-23 SURGERY — ENDOSCOPIC RETROGRADE CHOLANGIOPANCREATOGRAPHY (ERCP) WITH PROPOFOL
Anesthesia: General

## 2019-01-24 NOTE — Telephone Encounter (Signed)
The pt will need EUS/ERCP 2 1/2 hour spot in 6-8 weeks.

## 2019-01-24 NOTE — Telephone Encounter (Signed)
Will get pt set up as soon as the schedule is out for that time frame.  Message sent to myself to schedule as soon as possible.

## 2019-01-24 NOTE — Telephone Encounter (Signed)
I called and spoke with the patient on 01/23/2019.  I also spoke with the patient's daughter.  Unfortunately, the patient cannot tolerate an MRI which we had wanted so that we could optimize evaluation of the pancreas duct and bile duct since it is been a few months since his EUS.  We also found out that he had had a cardiology evaluation which is being requested by the anesthesia team.  As such, it is important for [place] to move forward with a cardiology evaluation which I have spoken with the patient's daughter about and she will reach out to the patient's cardiology team and eating to get an update.  I have also given her our fax number so that they can send the information to [place] as well.  Because we have not evaluated the patient's biliary tree in while we need to get repeat labs including a hepatic function panel and an INR which had not been completed.  The patient's daughter is going to ask her father's primary care provider to get them and send [place] the records of those.  At like to move forward with rescheduling his ampullectomy within the next 6 to 8 weeks I do not want this to go any further out.  I will plan to do this as an EUS to quickly evaluate the biliary tree and pancreatic duct prior to attempt at ampullectomy. Patty, please schedule this patient as an EUS ERCP 2-1/2-hour slot.  Thank you. GM

## 2019-01-28 NOTE — Telephone Encounter (Signed)
Patient with ampullary adenoma discovered on EGD for foreign body esophagus. Endoscopic removal delayed because of ongoing COVID-19 pandemic. Agree with Dr. Donneta Romberg plans.

## 2019-01-29 ENCOUNTER — Encounter: Payer: Self-pay | Admitting: Cardiology

## 2019-01-29 ENCOUNTER — Ambulatory Visit (INDEPENDENT_AMBULATORY_CARE_PROVIDER_SITE_OTHER): Payer: Medicare HMO | Admitting: Cardiology

## 2019-01-29 ENCOUNTER — Other Ambulatory Visit: Payer: Self-pay

## 2019-01-29 VITALS — BP 164/83 | HR 57 | Temp 97.1°F | Ht 72.0 in | Wt 239.0 lb

## 2019-01-29 DIAGNOSIS — I10 Essential (primary) hypertension: Secondary | ICD-10-CM

## 2019-01-29 DIAGNOSIS — I2583 Coronary atherosclerosis due to lipid rich plaque: Secondary | ICD-10-CM

## 2019-01-29 DIAGNOSIS — I2510 Atherosclerotic heart disease of native coronary artery without angina pectoris: Secondary | ICD-10-CM | POA: Diagnosis not present

## 2019-01-29 DIAGNOSIS — E782 Mixed hyperlipidemia: Secondary | ICD-10-CM | POA: Diagnosis not present

## 2019-01-29 DIAGNOSIS — R0602 Shortness of breath: Secondary | ICD-10-CM | POA: Diagnosis not present

## 2019-01-29 DIAGNOSIS — R600 Localized edema: Secondary | ICD-10-CM

## 2019-01-29 MED ORDER — FUROSEMIDE 40 MG PO TABS: 40.0000 mg | ORAL_TABLET | Freq: Every day | ORAL | 3 refills | Status: DC

## 2019-01-29 NOTE — Progress Notes (Signed)
Clinical Summary Clifford Jones is a 68 y.o.male last seen by Dr Clifford Jones in 2017, this is our first visit together.   1. CAD - history of CABG in 2003 -  h/o non-STEMI and PCI with DES to the native RPLA via the [place] graft. He has residual VG->OM, native OM, distal LAD, and diagonal disease, which is being medically managed.   - no chest pain. Chronic SOB/DOE - sedentary lifestyle, highest level working in yard - can walk up to 2-3 blocks he thinks, though sedentary lifestyle.  - compliant with meds   2. LE edema - just started few weeks. +abdominal distension. Weight in 07/2018 was 228, today 239 lbs - +orthopnea, sleeping in recline.  - limiting sodium intake.   3. HTN - compliant with meds  4. Hyperlipidemia - compliant with statin   5. Ampullary adenoma - noted by recent EGD, being considered for ERCP/ ampullectomy with propofol    Past Medical History:  Diagnosis Date   Claustrophobia    Closed fracture of right distal tibia    Coronary artery disease    a. 1999 Inf MI;  b. 2003 CABGx6;  c. 2008 NSTEMI, occluded SVG to marginal and diagonal system. 2 DES placed to marginal system;  c. NSTEMI - 02/2012 Xience Xpedition DES to oS-OM and POBA to mid lesion;  c. 10/2013 NSTEMI/PCI: VG->OM 80 ISR, native OM 80/90 small, LIMA->LAD ok w/ 90 dLAD, D1 80, RIMA->RPL ok w/ 95 in RPL (2.5x14 Resolute DES), nl EF.   GERD (gastroesophageal reflux disease)    Hyperlipidemia    Hypertension    Myocardial infarction ([facility])    X 6   S/P AAA repair    Tobacco abuse      No Known Allergies   Current Outpatient Medications  Medication Sig Dispense Refill   acetaminophen (TYLENOL) 500 MG tablet Take 1,000 mg by mouth every 6 (six) hours as needed for moderate pain or headache.     albuterol (PROVENTIL) (2.5 MG/3ML) 0.083% nebulizer solution Take 2.5 mg by nebulization every 6 (six) hours as needed for wheezing or shortness of breath.     amLODipine (NORVASC) 5 MG  tablet TAKE ONE TABLET BY MOUTH EVERY DAY (Patient taking differently: Take 5 mg by mouth daily. ) 15 tablet 0   aspirin 81 MG EC tablet Take 1 tablet (81 mg total) by mouth daily. 30 tablet 11   carvedilol (COREG) 6.25 MG tablet TAKE ONE TABLET BY MOUTH TWICE DAILY (Patient taking differently: Take 6.25 mg by mouth 2 (two) times daily. ) 30 tablet 0   lisinopril (PRINIVIL,ZESTRIL) 20 MG tablet TAKE ONE TABLET BY MOUTH EVERY DAY (Patient taking differently: Take 20 mg by mouth daily. ) 15 tablet 0   nitroGLYCERIN (NITROSTAT) 0.4 MG SL tablet Place 1 tablet (0.4 mg total) under the tongue every 5 (five) minutes x 3 doses as needed for chest pain. 25 tablet 4   pantoprazole (PROTONIX) 40 MG tablet TAKE 1 TABLET BY MOUTH TWICE DAILY (Patient taking differently: Take 40 mg by mouth 2 (two) times daily. ) 60 tablet 3   RANEXA 1000 MG SR tablet TAKE ONE TABLET BY MOUTH TWICE DAILY (Patient taking differently: Take 1,000 mg by mouth 2 (two) times daily. ) 30 tablet 0   rosuvastatin (CRESTOR) 40 MG tablet TAKE ONE TABLET BY MOUTH EVERY DAY (Patient taking differently: Take 40 mg by mouth daily. ) 15 tablet 0   No current facility-administered medications for this visit.  Past Surgical History:  Procedure Laterality Date   ABDOMINAL AORTIC ANEURYSM REPAIR  2006   APPENDECTOMY     CORONARY ANGIOPLASTY WITH STENT PLACEMENT     CORONARY ARTERY BYPASS GRAFT  2003   ESOPHAGEAL DILATION  07/06/2018   Procedure: ESOPHAGEAL DILATION;  Surgeon: Clifford Houston, MD;  [facility];  Service: Endoscopy;;   ESOPHAGOGASTRODUODENOSCOPY N/A 07/06/2018   Procedure: ESOPHAGOGASTRODUODENOSCOPY (EGD);  Surgeon: Clifford Houston, MD;  [facility];  Service: Endoscopy;  Laterality: N/A;   ESOPHAGOGASTRODUODENOSCOPY N/A 08/16/2018   Procedure: ESOPHAGOGASTRODUODENOSCOPY (EGD);  Surgeon: Clifford Banister, MD;  Location: [facility];  Service: Endoscopy;  Laterality: N/A;   EUS N/A  08/16/2018   Procedure: UPPER ENDOSCOPIC ULTRASOUND (EUS) RADIAL;  Surgeon: Clifford Banister, MD;  Location: [facility];  Service: Endoscopy;  Laterality: N/A;   FOREIGN BODY REMOVAL  07/06/2018   Procedure: FOREIGN BODY REMOVAL;  Surgeon: Clifford Houston, MD;  [facility];  Service: Endoscopy;;   HERNIA REPAIR     umbicical   LEFT HEART CATHETERIZATION WITH CORONARY ANGIOGRAM N/A 03/05/2012   Procedure: LEFT HEART CATHETERIZATION WITH CORONARY ANGIOGRAM;  Surgeon: Clifford Bow, MD;  Location: [facility] CATH LAB;  Service: Cardiovascular;  Laterality: N/A;   LEFT HEART CATHETERIZATION WITH CORONARY ANGIOGRAM N/A 10/23/2013   Procedure: LEFT HEART CATHETERIZATION WITH CORONARY ANGIOGRAM;  Surgeon: Clifford Ohara, MD;  Location: [facility] CATH LAB;  Service: Cardiovascular;  Laterality: N/A;   LEFT HEART CATHETERIZATION WITH CORONARY/GRAFT ANGIOGRAM  10/22/2013   Procedure: LEFT HEART CATHETERIZATION WITH Beatrix Fetters;  Surgeon: Clifford Man, MD;  Location: [facility] CATH LAB;  Service: Cardiovascular;;   PERCUTANEOUS CORONARY STENT INTERVENTION (PCI-S) N/A 03/07/2012   Procedure: PERCUTANEOUS CORONARY STENT INTERVENTION (PCI-S);  Surgeon: Clifford Mocha, MD;  Location: [facility] CATH LAB;  Service: Cardiovascular;  Laterality: N/A;   PERCUTANEOUS CORONARY STENT INTERVENTION (PCI-S) Right 10/23/2013   Procedure: PERCUTANEOUS CORONARY STENT INTERVENTION (PCI-S);  Surgeon: Clifford Ohara, MD;  Location: [facility] CATH LAB;  Service: Cardiovascular;  Laterality: Right;   POLYPECTOMY  07/06/2018   Procedure: POLYPECTOMY;  Surgeon: Clifford Houston, MD;  [facility];  Service: Endoscopy;;  polyp at ampulla   Right hand surgery     TIBIA IM NAIL INSERTION Right 04/07/2016   Procedure: FIXATION OF RIGHT TIBIA FRACTURE;  Surgeon: Clifford Balding, MD;  Location: [place];  Service: Orthopedics;  Laterality: Right;     No Known Allergies    Family History  Problem Relation Age of Onset     Coronary artery disease Other    Colon cancer Neg Hx    Esophageal cancer Neg Hx    Inflammatory bowel disease Neg Hx    Liver disease Neg Hx    Pancreatic cancer Neg Hx    Rectal cancer Neg Hx    Stomach cancer Neg Hx      Social History Mr. Peace reports that he has been smoking cigarettes. He started smoking about 54 years ago. He has a 54.00 pack-year smoking history. He has never used smokeless tobacco. Mr. Lenker reports current alcohol use.   Review of Systems CONSTITUTIONAL: No weight loss, fever, chills, weakness or fatigue.  HEENT: Eyes: No visual loss, blurred vision, double vision or yellow sclerae.No hearing loss, sneezing, congestion, runny nose or sore throat.  SKIN: No rash or itching.  CARDIOVASCULAR: per hpi RESPIRATORY: per hpi  GASTROINTESTINAL: No anorexia, nausea, vomiting or diarrhea. No abdominal pain or blood.  GENITOURINARY: No burning on urination, no polyuria NEUROLOGICAL: No headache, dizziness, syncope, paralysis, ataxia, numbness or tingling in the extremities. No change in bowel or bladder control.  MUSCULOSKELETAL: No muscle, back pain, joint pain or stiffness.  LYMPHATICS: No enlarged nodes. No history of splenectomy.  PSYCHIATRIC: No history of depression or anxiety.  ENDOCRINOLOGIC: No reports of sweating, cold or heat intolerance. No polyuria or polydipsia.  Marland Kitchen   Physical Examination Today's Vitals   01/29/19 0913  BP: (!) 164/83  Pulse: (!) 57  Temp: (!) 97.1 F (36.2 C)  Weight: 239 lb (108.4 kg)  Height: 6' (1.829 m)   Body mass index is 32.41 kg/m.  Gen: resting comfortably, no acute distress HEENT: no scleral icterus, pupils equal round and reactive, no palptable cervical adenopathy,  CV: RRR, no m/r/g, no jvd Resp: decrased breath sounds bilateral bases GI: abdomen is soft, non-tender, non-distended, normal bowel sounds, no hepatosplenomegaly MSK: 2+ bilatearl LE edema Skin: warm, no rash Neuro:  no focal  deficits Psych: appropriate affect   Diagnostic Studies  02/2015 echo Study Conclusions  - Left ventricle: The cavity size was normal. There was mild   concentric hypertrophy. Systolic function was normal. The   estimated ejection fraction was in the range of 55% to 60%.   Possible hypokinesis of the basalinferior myocardium. Marked   bradycardia limits evaluation of LV diastolic function. - Aortic valve: There was trivial regurgitation. - Left atrium: The atrium was moderately dilated. - Right ventricle: Systolic function was mildly reduced.   02/2015 nuclear stress  There was no ST segment deviation noted during stress.  T wave inversion of 1 mm was noted during stress in the I, II, aVF, V5 and V6 leads. T wave inversion persisted.  Defect 1: There is a medium defect of severe severity present in the apical anterior, apical inferior and apex location.  Findings consistent with prior myocardial infarction. There is no evidence of ischemia.  This is an intermediate risk study.  Nuclear stress EF: 39%.    Assessment and Plan  1. CAD - no recent chest pain - chronic SOB/DOE unchanged -recent clinic HF symptoms, will obtain echo to further evaluate. Pending echo results, may need to consider ischemic evaluation - EKG today SR, no acute ischemic changes  2. LE edema - clinical signs of acute HF with LE edema, abdominal distension, orthopnea, up 11 lbs since 07/2018  start lasix 40mg  daily. Check BMET/Mg/TSH in 2 weeks - obtain echo  3. Preoperative evaluation - being considered for removal of an ampullary adenoma - clinical signs of active HF, will need to be euvolemic and workup completed prior to surgery    F/u PA 1-2 weeks. Patient normally followed by Dr Clifford Jones, will need to f/u with him after that.   Arnoldo Lenis, M.D.

## 2019-01-29 NOTE — Patient Instructions (Signed)
Medication Instructions:  START LASIX 40 MG DAILY   Labwork:  2 WEEKS   Testing/Procedures: Your physician has requested that you have an echocardiogram. Echocardiography is a painless test that uses sound waves to create images of your heart. It provides your doctor with information about the size and shape of your heart and how well your heart's chambers and valves are working. This procedure takes approximately one hour. There are no restrictions for this procedure.    Follow-Up: Your physician recommends that you schedule a follow-up appointment in: 2 WEEKS     Any Other Special Instructions Will Be Listed Below (If Applicable).     If you need a refill on your cardiac medications before your next appointment, please call your pharmacy.

## 2019-01-30 ENCOUNTER — Ambulatory Visit (HOSPITAL_COMMUNITY)
Admission: RE | Admit: 2019-01-30 | Discharge: 2019-01-30 | Disposition: A | Discharge status: Home / Self Care | Payer: Medicare HMO | Source: Ambulatory Visit | Attending: Cardiology | Admitting: Cardiology

## 2019-01-30 ENCOUNTER — Other Ambulatory Visit: Payer: Self-pay

## 2019-01-30 DIAGNOSIS — R0602 Shortness of breath: Secondary | ICD-10-CM | POA: Diagnosis not present

## 2019-01-30 NOTE — Progress Notes (Signed)
*  PRELIMINARY RESULTS* Echocardiogram 2D Echocardiogram has been performed.  Clifford Jones 01/30/2019, 1:50 PM

## 2019-02-06 ENCOUNTER — Telehealth: Payer: Self-pay | Admitting: Cardiology

## 2019-02-06 NOTE — Telephone Encounter (Signed)
Patient's daughter states that patient was given echo results and in turn called his daughter and asked her to call office and speak with DR.Branch's nurse. / tg

## 2019-02-07 NOTE — Telephone Encounter (Signed)
Returned call. No answer at extension that was asked to call on.

## 2019-02-08 NOTE — Telephone Encounter (Signed)
Returned call. No answer, left message for pt's daughter to call back.

## 2019-02-18 DIAGNOSIS — Z955 Presence of coronary angioplasty implant and graft: Secondary | ICD-10-CM | POA: Diagnosis not present

## 2019-02-18 DIAGNOSIS — K210 Gastro-esophageal reflux disease with esophagitis: Secondary | ICD-10-CM | POA: Diagnosis not present

## 2019-02-18 DIAGNOSIS — I10 Essential (primary) hypertension: Secondary | ICD-10-CM | POA: Diagnosis not present

## 2019-02-18 DIAGNOSIS — J449 Chronic obstructive pulmonary disease, unspecified: Secondary | ICD-10-CM | POA: Diagnosis not present

## 2019-02-18 DIAGNOSIS — J441 Chronic obstructive pulmonary disease with (acute) exacerbation: Secondary | ICD-10-CM | POA: Diagnosis not present

## 2019-02-18 DIAGNOSIS — F17210 Nicotine dependence, cigarettes, uncomplicated: Secondary | ICD-10-CM | POA: Diagnosis not present

## 2019-02-20 ENCOUNTER — Encounter: Payer: Self-pay | Admitting: Student

## 2019-02-20 ENCOUNTER — Encounter: Payer: Self-pay | Admitting: *Deleted

## 2019-02-20 ENCOUNTER — Other Ambulatory Visit: Payer: Self-pay

## 2019-02-20 ENCOUNTER — Ambulatory Visit (INDEPENDENT_AMBULATORY_CARE_PROVIDER_SITE_OTHER): Payer: Medicare HMO | Admitting: Student

## 2019-02-20 VITALS — BP 126/68 | HR 62 | Temp 97.8°F | Ht 72.0 in | Wt 235.0 lb

## 2019-02-20 DIAGNOSIS — E785 Hyperlipidemia, unspecified: Secondary | ICD-10-CM | POA: Diagnosis not present

## 2019-02-20 DIAGNOSIS — Z720 Tobacco use: Secondary | ICD-10-CM | POA: Diagnosis not present

## 2019-02-20 DIAGNOSIS — I5189 Other ill-defined heart diseases: Secondary | ICD-10-CM | POA: Diagnosis not present

## 2019-02-20 DIAGNOSIS — I2510 Atherosclerotic heart disease of native coronary artery without angina pectoris: Secondary | ICD-10-CM | POA: Diagnosis not present

## 2019-02-20 DIAGNOSIS — I10 Essential (primary) hypertension: Secondary | ICD-10-CM

## 2019-02-20 NOTE — Patient Instructions (Addendum)
Medication Instructions:  Your physician recommends that you continue on your current medications as directed. Please refer to the Current Medication list given to you today.  If you need a refill on your cardiac medications before your next appointment, please call your pharmacy.   Lab work: NONE  If you have labs (blood work) drawn today and your tests are completely normal, you will receive your results only by: Marland Kitchen MyChart Message (if you have MyChart) OR . A paper copy in the mail If you have any lab test that is abnormal or we need to change your treatment, we will call you to review the results.  Testing/Procedures: NONE  Follow-Up: At [facility], you and your health needs are our priority.  As part of our continuing mission to provide you with exceptional heart care, we have created designated Provider Care Teams.  These Care Teams include your primary Cardiologist (physician) and Advanced Practice Providers (APPs -  Physician Assistants and Nurse Practitioners) who all work together to provide you with the care you need, when you need it. You will need a follow up appointment in 5-6 months.  Please call our office 2 months in advance to schedule this appointment.  You may see Kate Sable, MD or one of the following Advanced Practice Providers on your designated Care Team:   Bernerd Pho, PA-C [facility]) . Ermalinda Barrios, PA-C ([place])  Any Other Special Instructions Will Be Listed Below (If Applicable). Thank you for choosing [facility]!     Limit daily fluid intake to less than 2 Liters per day. Please limit salt intake.  Please weight yourself every morning. Take an extra Lasix tablet if weight increases by 3 pounds overnight or 5 pounds in a single week.

## 2019-02-20 NOTE — Progress Notes (Signed)
Cardiology Office Note    Date:  02/20/2019   ID:  Bricyn, Fok 1951/01/08, MRN [id number]  PCP:  Lavella Lemons, PA  Cardiologist: Kate Sable, MD    Chief Complaint  Patient presents with  . Follow-up    3 week visit    History of Present Illness:    Clifford Jones is a 68 y.o. male with past medical history of CAD (s/p CABG in 2003 with NSTEMI in 2008 and DES to [place], NSTEMI in 2013 with DES to OM, and NSTEMI in 2015 with DES to RPAL), HTN, HLD, and continued tobacco use who presents to the office today for 3-week follow-up.  He was last examined by Dr. Harl Bowie in 01/2019 and reported having chronic dyspnea on exertion but denied any associated chest pain. He had started noticing worsening abdominal distention and orthopnea and weight was elevated to 239 lbs (previously 228 lbs in 07/2018). Was started on Lasix 40 mg daily and a repeat echocardiogram was recommended for further assessment. He was typically followed by Dr. Bronson Ing and was recommended to re-establish regular follow-up.   Echocardiogram showed preserved EF of 55 to 60% with mild basal septal hypertrophy and diastolic dysfunction. No significant valve abnormalities were noted. He reported having lost 9 pounds since the initiation of Lasix. It does not appear that follow-up labs were obtained.  In talking with the patient today, he reports significant improvement in his symptoms since his last office visit. Says that his weight declined by over 15 pounds on his home scales and while this is listed at 235 lbs on the office scales today he is wearing full overalls and just consumed lunch. He says weight was 220 lbs on his home scales this AM. He has noticed significant improvement in his orthopnea, dyspnea on exertion and lower extremity edema since starting Lasix. He had repeat labs with his PCP on Monday and will request a copy of these records.  He denies any recent chest pain or palpitations. No recent  dizziness or presyncope. He is scheduled for ERCP with propofol with Dr. Rush Landmark due to an ampullary adenoma and needs clearance sent to their office.   Past Medical History:  Diagnosis Date  . Claustrophobia   . Closed fracture of right distal tibia   . Coronary artery disease    a. 1999 Inf MI;  b. 2003 CABGx6;  c. 2008 NSTEMI, occluded SVG to marginal and diagonal system. 2 DES placed to marginal system;  c. NSTEMI - 02/2012 Xience Xpedition DES to oS-OM and POBA to mid lesion;  c. 10/2013 NSTEMI/PCI: VG->OM 80 ISR, native OM 80/90 small, LIMA->LAD ok w/ 90 dLAD, D1 80, RIMA->RPL ok w/ 95 in RPL (2.5x14 Resolute DES), nl EF.  Marland Kitchen GERD (gastroesophageal reflux disease)   . Hyperlipidemia   . Hypertension   . Myocardial infarction ([place])    X 6  . S/P AAA repair   . Tobacco abuse     Past Surgical History:  Procedure Laterality Date  . ABDOMINAL AORTIC ANEURYSM REPAIR  2006  . APPENDECTOMY    . CORONARY ANGIOPLASTY WITH STENT PLACEMENT    . CORONARY ARTERY BYPASS GRAFT  2003  . ESOPHAGEAL DILATION  07/06/2018   Procedure: ESOPHAGEAL DILATION;  Surgeon: Rogene Houston, MD;  [facility]: [facility];  Service: Endoscopy;;  . ESOPHAGOGASTRODUODENOSCOPY N/A 07/06/2018   Procedure: ESOPHAGOGASTRODUODENOSCOPY (EGD);  Surgeon: Rogene Houston, MD;  [facility]: [facility];  Service: Endoscopy;  Laterality: N/A;  .  ESOPHAGOGASTRODUODENOSCOPY N/A 08/16/2018   Procedure: ESOPHAGOGASTRODUODENOSCOPY (EGD);  Surgeon: Milus Banister, MD;  Location: [facility];  Service: Endoscopy;  Laterality: N/A;  . EUS N/A 08/16/2018   Procedure: UPPER ENDOSCOPIC ULTRASOUND (EUS) RADIAL;  Surgeon: Milus Banister, MD;  Location: [facility];  Service: Endoscopy;  Laterality: N/A;  . FOREIGN BODY REMOVAL  07/06/2018   Procedure: FOREIGN BODY REMOVAL;  Surgeon: Rogene Houston, MD;  [facility]: [facility];  Service: Endoscopy;;  . HERNIA REPAIR     umbicical  . LEFT HEART CATHETERIZATION WITH CORONARY  ANGIOGRAM N/A 03/05/2012   Procedure: LEFT HEART CATHETERIZATION WITH CORONARY ANGIOGRAM;  Surgeon: Hillary Bow, MD;  Location: [facility] CATH LAB;  Service: Cardiovascular;  Laterality: N/A;  . LEFT HEART CATHETERIZATION WITH CORONARY ANGIOGRAM N/A 10/23/2013   Procedure: LEFT HEART CATHETERIZATION WITH CORONARY ANGIOGRAM;  Surgeon: Blane Ohara, MD;  Location: [facility] CATH LAB;  Service: Cardiovascular;  Laterality: N/A;  . LEFT HEART CATHETERIZATION WITH CORONARY/GRAFT ANGIOGRAM  10/22/2013   Procedure: LEFT HEART CATHETERIZATION WITH Beatrix Fetters;  Surgeon: Leonie Man, MD;  Location: [facility] CATH LAB;  Service: Cardiovascular;;  . PERCUTANEOUS CORONARY STENT INTERVENTION (PCI-S) N/A 03/07/2012   Procedure: PERCUTANEOUS CORONARY STENT INTERVENTION (PCI-S);  Surgeon: Sherren Mocha, MD;  Location: [facility] CATH LAB;  Service: Cardiovascular;  Laterality: N/A;  . PERCUTANEOUS CORONARY STENT INTERVENTION (PCI-S) Right 10/23/2013   Procedure: PERCUTANEOUS CORONARY STENT INTERVENTION (PCI-S);  Surgeon: Blane Ohara, MD;  Location: [facility] CATH LAB;  Service: Cardiovascular;  Laterality: Right;  . POLYPECTOMY  07/06/2018   Procedure: POLYPECTOMY;  Surgeon: Rogene Houston, MD;  [facility];  Service: Endoscopy;;  polyp at ampulla  . Right hand surgery    . TIBIA IM NAIL INSERTION Right 04/07/2016   Procedure: FIXATION OF RIGHT TIBIA FRACTURE;  Surgeon: Garald Balding, MD;  Location: [facility];  Service: Orthopedics;  Laterality: Right;    Current Medications: Outpatient Medications Prior to Visit  Medication Sig Dispense Refill  . acetaminophen (TYLENOL) 500 MG tablet Take 1,000 mg by mouth every 6 (six) hours as needed for moderate pain or headache.    . albuterol (PROVENTIL) (2.5 MG/3ML) 0.083% nebulizer solution Take 2.5 mg by nebulization every 6 (six) hours as needed for wheezing or shortness of breath.    Marland Kitchen amLODipine (NORVASC) 5 MG tablet TAKE ONE TABLET BY MOUTH EVERY DAY (Patient  taking differently: Take 5 mg by mouth daily. ) 15 tablet 0  . aspirin 81 MG EC tablet Take 1 tablet (81 mg total) by mouth daily. 30 tablet 11  . carvedilol (COREG) 6.25 MG tablet TAKE ONE TABLET BY MOUTH TWICE DAILY (Patient taking differently: Take 6.25 mg by mouth 2 (two) times daily. ) 30 tablet 0  . furosemide (LASIX) 40 MG tablet Take 1 tablet (40 mg total) by mouth daily. 90 tablet 3  . lisinopril (PRINIVIL,ZESTRIL) 20 MG tablet TAKE ONE TABLET BY MOUTH EVERY DAY (Patient taking differently: Take 20 mg by mouth daily. ) 15 tablet 0  . nitroGLYCERIN (NITROSTAT) 0.4 MG SL tablet Place 1 tablet (0.4 mg total) under the tongue every 5 (five) minutes x 3 doses as needed for chest pain. 25 tablet 4  . pantoprazole (PROTONIX) 40 MG tablet TAKE 1 TABLET BY MOUTH TWICE DAILY (Patient taking differently: Take 40 mg by mouth 2 (two) times daily. ) 60 tablet 3  . RANEXA 1000 MG SR tablet TAKE ONE TABLET BY MOUTH TWICE DAILY (Patient taking differently: Take 1,000  mg by mouth 2 (two) times daily. ) 30 tablet 0  . rosuvastatin (CRESTOR) 40 MG tablet TAKE ONE TABLET BY MOUTH EVERY DAY (Patient taking differently: Take 40 mg by mouth daily. ) 15 tablet 0   No facility-administered medications prior to visit.      Allergies:   Patient has no known allergies.   Social History   Socioeconomic History  . Marital status: Legally Separated    Spouse name: Not on file  . Number of children: Not on file  . Years of education: Not on file  . Highest education level: Not on file  Occupational History  . Not on file  Social Needs  . Financial resource strain: Not on file  . Food insecurity    Worry: Not on file    Inability: Not on file  . Transportation needs    Medical: Not on file    Non-medical: Not on file  Tobacco Use  . Smoking status: Current Some Day Smoker    Packs/day: 1.00    Years: 54.00    Pack years: 54.00    Types: Cigarettes    Start date: 10/04/1964  . Smokeless tobacco: Never  Used  Substance and Sexual Activity  . Alcohol use: Yes    Comment: Maybe 2 beers a month and 2 shotsa month  . Drug use: No  . Sexual activity: Yes  Lifestyle  . Physical activity    Days per week: Not on file    Minutes per session: Not on file  . Stress: Not on file  Relationships  . Social Herbalist on phone: Not on file    Gets together: Not on file    Attends religious service: Not on file    Active member of club or organization: Not on file    Attends meetings of clubs or organizations: Not on file    Relationship status: Not on file  Other Topics Concern  . Not on file  Social History Narrative   Disabled.  Former truck Software engineer. Divorced and remarried x 2           Family History:  The patient's family history includes Coronary artery disease in an other family member.   Review of Systems:   Please see the history of present illness.     General:  No chills, fever, night sweats or weight changes.  Cardiovascular:  No chest pain, dyspnea on exertion, palpitations, paroxysmal nocturnal dyspnea. Positive for orthopnea and edema (improved).  Dermatological: No rash, lesions/masses Respiratory: No cough, dyspnea Urologic: No hematuria, dysuria Abdominal:   No nausea, vomiting, diarrhea, bright red blood per rectum, melena, or hematemesis Neurologic:  No visual changes, wkns, changes in mental status. All other systems reviewed and are otherwise negative except as noted above.   Physical Exam:    VS:  BP 126/68 (BP Location: Right Arm)   Pulse 62   Temp 97.8 F (36.6 C)   Ht 6' (1.829 m)   Wt 235 lb (106.6 kg)   SpO2 97%   BMI 31.87 kg/m    General: Well developed, well nourished,male appearing in no acute distress. Head: Normocephalic, atraumatic, sclera non-icteric, no xanthomas, nares are without discharge.  Neck: No carotid bruits. JVD not elevated.  Lungs: Respirations regular and unlabored, without wheezes or rales.   Heart: Regular rate and rhythm. No S3 or S4.  No murmur, no rubs, or gallops appreciated. Abdomen: Soft, non-tender, non-distended with normoactive bowel sounds.  No hepatomegaly. No rebound/guarding. No obvious abdominal masses. Msk:  Strength and tone appear normal for age. No joint deformities or effusions. Extremities: No clubbing or cyanosis. Trace lower extremity edema bilaterally.  Distal pedal pulses are 2+ bilaterally. Neuro: Alert and oriented X 3. Moves all extremities spontaneously. No focal deficits noted. Psych:  Responds to questions appropriately with a normal affect. Skin: No rashes or lesions noted  Wt Readings from Last 3 Encounters:  02/20/19 235 lb (106.6 kg)  01/29/19 239 lb (108.4 kg)  08/16/18 230 lb (104.3 kg)     Studies/Labs Reviewed:   EKG:  EKG is not ordered today.   Recent Labs: 01/22/2019: Creatinine, Ser 0.90   Lipid Panel    Component Value Date/Time   CHOL 161 03/02/2015 0450   TRIG 294 (H) 03/02/2015 0450   HDL 33 (L) 03/02/2015 0450   CHOLHDL 4.9 03/02/2015 0450   VLDL 59 (H) 03/02/2015 0450   LDLCALC 69 03/02/2015 0450    Additional studies/ records that were reviewed today include:   Echocardiogram: 01/30/2019 IMPRESSIONS   1. The left ventricle has normal systolic function, with an ejection fraction of 55-60%. The cavity size was normal. Mild basal septal hypertrophy. Diastolic dysfunction, grade indeterminate. Indeterminate filling pressures No evidence of left  ventricular regional wall motion abnormalities.  2. The mitral valve is grossly normal.  3. The tricuspid valve is grossly normal.  4. The aortic valve is tricuspid. Mild thickening of the aortic valve. Sclerosis without any evidence of stenosis of the aortic valve.  5. The aorta is normal unless otherwise noted.  6. The inferior vena cava was dilated in size with >50% respiratory variability.  7. The interatrial septum appears to be lipomatous.   Assessment:    1.  Coronary artery disease involving native coronary artery of native heart without angina pectoris   2. Diastolic dysfunction   3. Essential hypertension   4. Hyperlipidemia LDL goal <70   5. Tobacco abuse      Plan:   In order of problems listed above:  1. CAD - s/p CABG in 2003 with NSTEMI in 2008 and DES to [place], NSTEMI in 2013 with DES to OM, and NSTEMI in 2015 with DES to [place]. He was experiencing dyspnea on exertion but says this has resolved with initiation of diuretic therapy. Denies any recent chest pain. He is able to perform over 4 METS of activity without any anginal symptoms. - Continue current medication regimen at this time with ASA, beta-blocker, Ranexa and statin therapy.  2. Diastolic Dysfunction - Recently presented with worsening dyspnea on exertion, orthopnea, and lower extremity edema and was initiated on Lasix 40 mg daily. Repeat echo showed a preserved EF of 55 to 60% as outlined above. His weight has declined by over 15 pounds on his home scales and his respiratory status is back to baseline. Will request recent BMET from PCP which was obtained on Monday. Would continue with Lasix 40 mg daily for now with instructions to take an extra tablet as needed for weight gain greater than 2 to 3 pounds overnight or 5 pounds in 1 week. Cardiac clearance was initially not provided given his volume overload but now that he is back to euvolemia, he should be of acceptable risk to proceed with ERCP without further cardiac testing. RCRI Risk overall low at 0.9% risk of major cardiac event. Will forward today's note to GI.  3. HTN -BP is well controlled at 126/68 during today's visit. Continue current medication  regimen with Amlodipine 5 mg daily, Coreg 6.25 mg twice daily, and Lisinopril 20 mg daily.  4. HLD - Followed by PCP.  Goal LDL is less than 70 in the setting of known CAD.  He remains on Crestor 40 mg daily.  5. Tobacco Use - He reports smoking less than 1 pack/week.  Congratulated on his reduction with cessation advised.  Medication Adjustments/Labs and Tests Ordered: Current medicines are reviewed at length with the patient today.  Concerns regarding medicines are outlined above.  Medication changes, Labs and Tests ordered today are listed in the Patient Instructions below. Patient Instructions  Medication Instructions:  Your physician recommends that you continue on your current medications as directed. Please refer to the Current Medication list given to you today.  If you need a refill on your cardiac medications before your next appointment, please call your pharmacy.   Lab work: NONE  If you have labs (blood work) drawn today and your tests are completely normal, you will receive your results only by: Marland Kitchen MyChart Message (if you have MyChart) OR . A paper copy in the mail If you have any lab test that is abnormal or we need to change your treatment, we will call you to review the results.  Testing/Procedures: NONE  Follow-Up: At [facility], you and your health needs are our priority.  As part of our continuing mission to provide you with exceptional heart care, we have created designated Provider Care Teams.  These Care Teams include your primary Cardiologist (physician) and Advanced Practice Providers (APPs -  Physician Assistants and Nurse Practitioners) who all work together to provide you with the care you need, when you need it. You will need a follow up appointment in 5-6 months.  Please call our office 2 months in advance to schedule this appointment.  You may see Kate Sable, MD or one of the following Advanced Practice Providers on your designated Care Team:   Bernerd Pho, PA-C [facility]) . Ermalinda Barrios, PA-C ([place])  Any Other Special Instructions Will Be Listed Below (If Applicable). Thank you for choosing [facility]!    Limit daily fluid intake to less than 2 Liters per day. Please  limit salt intake.  Please weight yourself every morning. Take an extra Lasix tablet if weight increases by 3 pounds overnight or 5 pounds in a single week.   Signed, Erma Heritage, PA-C  02/20/2019 5:08 PM    [address] [place], [place] [address] Phone: [phone] Fax: [phone]

## 2019-02-21 ENCOUNTER — Other Ambulatory Visit: Payer: Self-pay

## 2019-02-21 ENCOUNTER — Telehealth: Payer: Self-pay

## 2019-02-21 DIAGNOSIS — D135 Benign neoplasm of extrahepatic bile ducts: Secondary | ICD-10-CM

## 2019-02-21 NOTE — Telephone Encounter (Signed)
-----   Message from Irving Copas., MD sent at 02/20/2019  5:19 PM EDT ----- Thank you for sending this to [place]. We will work on scheduling him. Lyle Niblett, please move forward with EUS/ERCP time slot for this patient 2.5-3 hour slot. Thank you. GM ----- Message ----- From: Erma Heritage, PA-C Sent: 02/20/2019   5:10 PM EDT To: Irving Copas., MD

## 2019-02-21 NOTE — Telephone Encounter (Signed)
EUS/ERCP scheduled, pt instructed and medications reviewed.  Patient instructions mailed to home.  Patient to call with any questions or concerns. COVID test instructions also given and pt verbalized understanding.

## 2019-02-21 NOTE — Telephone Encounter (Signed)
Pt returned your call.  

## 2019-02-21 NOTE — Telephone Encounter (Signed)
The pt has been scheduled for 03/25/19 for EUS/ERCP COVID testing on 10/1 at 1130 am.  Left message on machine to call back

## 2019-03-21 ENCOUNTER — Other Ambulatory Visit (HOSPITAL_COMMUNITY): Admission: RE | Admit: 2019-03-21 | Payer: Medicare HMO | Source: Ambulatory Visit

## 2019-03-22 ENCOUNTER — Encounter (HOSPITAL_COMMUNITY): Payer: Self-pay | Admitting: *Deleted

## 2019-03-22 ENCOUNTER — Other Ambulatory Visit: Payer: Self-pay

## 2019-03-22 ENCOUNTER — Other Ambulatory Visit (HOSPITAL_COMMUNITY)
Admission: RE | Admit: 2019-03-22 | Discharge: 2019-03-22 | Disposition: A | Discharge status: Home / Self Care | Payer: Medicare HMO | Source: Ambulatory Visit | Attending: Gastroenterology | Admitting: Gastroenterology

## 2019-03-22 DIAGNOSIS — Z01812 Encounter for preprocedural laboratory examination: Secondary | ICD-10-CM | POA: Diagnosis not present

## 2019-03-22 DIAGNOSIS — Z20828 Contact with and (suspected) exposure to other viral communicable diseases: Secondary | ICD-10-CM | POA: Insufficient documentation

## 2019-03-22 NOTE — Progress Notes (Signed)
Pt denies SOB and chest pain. Pt stated that he is under the care of Dr. Harl Bowie, Cardiology. Pt denies having a chest x ray in the last year. Pt denies recent labs. Pt made aware to stop taking vitamins, fish oil and herbal medications. Do not take any NSAIDs ie: Ibuprofen, Advil, Naproxen (Aleve), Motrin, BC and Goody Powder. Pt stated that he was not instructed to stop taking Aspirin. Pt verbalized understanding of all pre-op instructions. See PA, Anesthesiology, note ( 01/22/2019 ).

## 2019-03-24 LAB — NOVEL CORONAVIRUS, NAA (HOSP ORDER, SEND-OUT TO REF LAB; TAT 18-24 HRS): SARS-CoV-2, NAA: NOT DETECTED

## 2019-03-25 ENCOUNTER — Ambulatory Visit (HOSPITAL_COMMUNITY)
Admission: RE | Admit: 2019-03-25 | Discharge: 2019-03-25 | Disposition: A | Discharge status: Home / Self Care | Payer: Medicare HMO | Attending: Gastroenterology | Admitting: Gastroenterology

## 2019-03-25 ENCOUNTER — Other Ambulatory Visit: Payer: Self-pay

## 2019-03-25 ENCOUNTER — Ambulatory Visit (HOSPITAL_COMMUNITY): Payer: Medicare HMO | Admitting: Anesthesiology

## 2019-03-25 ENCOUNTER — Encounter (HOSPITAL_COMMUNITY)
Admission: RE | Disposition: A | Discharge status: Home / Self Care | Payer: Self-pay | Source: Home / Self Care | Attending: Gastroenterology

## 2019-03-25 ENCOUNTER — Encounter (HOSPITAL_COMMUNITY): Payer: Self-pay | Admitting: *Deleted

## 2019-03-25 ENCOUNTER — Ambulatory Visit (HOSPITAL_COMMUNITY): Payer: Medicare HMO

## 2019-03-25 DIAGNOSIS — D490 Neoplasm of unspecified behavior of digestive system: Secondary | ICD-10-CM

## 2019-03-25 DIAGNOSIS — J449 Chronic obstructive pulmonary disease, unspecified: Secondary | ICD-10-CM | POA: Diagnosis not present

## 2019-03-25 DIAGNOSIS — Z951 Presence of aortocoronary bypass graft: Secondary | ICD-10-CM | POA: Diagnosis not present

## 2019-03-25 DIAGNOSIS — K838 Other specified diseases of biliary tract: Secondary | ICD-10-CM | POA: Diagnosis not present

## 2019-03-25 DIAGNOSIS — F17210 Nicotine dependence, cigarettes, uncomplicated: Secondary | ICD-10-CM | POA: Diagnosis not present

## 2019-03-25 DIAGNOSIS — I739 Peripheral vascular disease, unspecified: Secondary | ICD-10-CM | POA: Insufficient documentation

## 2019-03-25 DIAGNOSIS — K317 Polyp of stomach and duodenum: Secondary | ICD-10-CM

## 2019-03-25 DIAGNOSIS — D132 Benign neoplasm of duodenum: Secondary | ICD-10-CM | POA: Diagnosis not present

## 2019-03-25 DIAGNOSIS — K3189 Other diseases of stomach and duodenum: Secondary | ICD-10-CM | POA: Diagnosis not present

## 2019-03-25 DIAGNOSIS — I10 Essential (primary) hypertension: Secondary | ICD-10-CM | POA: Insufficient documentation

## 2019-03-25 DIAGNOSIS — K319 Disease of stomach and duodenum, unspecified: Secondary | ICD-10-CM | POA: Diagnosis not present

## 2019-03-25 DIAGNOSIS — K229 Disease of esophagus, unspecified: Secondary | ICD-10-CM

## 2019-03-25 DIAGNOSIS — Z955 Presence of coronary angioplasty implant and graft: Secondary | ICD-10-CM | POA: Insufficient documentation

## 2019-03-25 DIAGNOSIS — D135 Benign neoplasm of extrahepatic bile ducts: Secondary | ICD-10-CM

## 2019-03-25 DIAGNOSIS — I252 Old myocardial infarction: Secondary | ICD-10-CM | POA: Insufficient documentation

## 2019-03-25 DIAGNOSIS — K449 Diaphragmatic hernia without obstruction or gangrene: Secondary | ICD-10-CM | POA: Diagnosis not present

## 2019-03-25 DIAGNOSIS — I2510 Atherosclerotic heart disease of native coronary artery without angina pectoris: Secondary | ICD-10-CM | POA: Diagnosis not present

## 2019-03-25 DIAGNOSIS — R198 Other specified symptoms and signs involving the digestive system and abdomen: Secondary | ICD-10-CM

## 2019-03-25 DIAGNOSIS — K259 Gastric ulcer, unspecified as acute or chronic, without hemorrhage or perforation: Secondary | ICD-10-CM | POA: Diagnosis not present

## 2019-03-25 DIAGNOSIS — Z9889 Other specified postprocedural states: Secondary | ICD-10-CM | POA: Diagnosis not present

## 2019-03-25 HISTORY — PX: POLYPECTOMY: SHX5525

## 2019-03-25 HISTORY — DX: Presence of dental prosthetic device (complete) (partial): Z97.2

## 2019-03-25 HISTORY — DX: Benign neoplasm of extrahepatic bile ducts: D13.5

## 2019-03-25 HISTORY — PX: EUS: SHX5427

## 2019-03-25 HISTORY — DX: Presence of spectacles and contact lenses: Z97.3

## 2019-03-25 HISTORY — PX: PANCREATIC STENT PLACEMENT: SHX5539

## 2019-03-25 HISTORY — PX: SPHINCTEROTOMY: SHX5544

## 2019-03-25 HISTORY — PX: ENDOSCOPIC RETROGRADE CHOLANGIOPANCREATOGRAPHY (ERCP) WITH PROPOFOL: SHX5810

## 2019-03-25 HISTORY — PX: ESOPHAGOGASTRODUODENOSCOPY (EGD) WITH PROPOFOL: SHX5813

## 2019-03-25 LAB — POCT I-STAT, CHEM 8
BUN: 19 mg/dL (ref 8–23)
Calcium, Ion: 1.14 mmol/L — ABNORMAL LOW (ref 1.15–1.40)
Chloride: 99 mmol/L (ref 98–111)
Creatinine, Ser: 0.8 mg/dL (ref 0.61–1.24)
Glucose, Bld: 115 mg/dL — ABNORMAL HIGH (ref 70–99)
HCT: 49 % (ref 39.0–52.0)
Hemoglobin: 16.7 g/dL (ref 13.0–17.0)
Potassium: 3.9 mmol/L (ref 3.5–5.1)
Sodium: 140 mmol/L (ref 135–145)
TCO2: 30 mmol/L (ref 22–32)

## 2019-03-25 SURGERY — UPPER ENDOSCOPIC ULTRASOUND (EUS) RADIAL
Anesthesia: General

## 2019-03-25 MED ORDER — GLYCOPYRROLATE 0.2 MG/ML IJ SOLN
INTRAMUSCULAR | Status: DC | PRN
  Administered 2019-03-25: 0.2 mg via INTRAVENOUS

## 2019-03-25 MED ORDER — ONDANSETRON HCL 4 MG/2ML IJ SOLN
INTRAMUSCULAR | Status: DC | PRN
  Administered 2019-03-25: 4 mg via INTRAVENOUS

## 2019-03-25 MED ORDER — ROCURONIUM BROMIDE 10 MG/ML (PF) SYRINGE
PREFILLED_SYRINGE | INTRAVENOUS | Status: DC | PRN
  Administered 2019-03-25: 30 mg via INTRAVENOUS
  Administered 2019-03-25: 60 mg via INTRAVENOUS
  Administered 2019-03-25: 10 mg via INTRAVENOUS

## 2019-03-25 MED ORDER — SODIUM CHLORIDE 0.9 % IV SOLN: INTRAVENOUS | Status: DC

## 2019-03-25 MED ORDER — SUGAMMADEX SODIUM 200 MG/2ML IV SOLN
INTRAVENOUS | Status: DC | PRN
  Administered 2019-03-25: 200 mg via INTRAVENOUS

## 2019-03-25 MED ORDER — LIDOCAINE 2% (20 MG/ML) 5 ML SYRINGE
INTRAMUSCULAR | Status: DC | PRN
  Administered 2019-03-25: 100 mg via INTRAVENOUS

## 2019-03-25 MED ORDER — INDOMETHACIN 50 MG RE SUPP
RECTAL | Status: DC | PRN
  Administered 2019-03-25: 100 mg via RECTAL

## 2019-03-25 MED ORDER — SODIUM CHLORIDE 0.9 % IV SOLN
INTRAVENOUS | Status: DC | PRN
  Administered 2019-03-25: 12:00:00 50 ug/min via INTRAVENOUS

## 2019-03-25 MED ORDER — GLUCAGON HCL RDNA (DIAGNOSTIC) 1 MG IJ SOLR
INTRAMUSCULAR | Status: AC
  Filled 2019-03-25: qty 1

## 2019-03-25 MED ORDER — DEXAMETHASONE SODIUM PHOSPHATE 10 MG/ML IJ SOLN
INTRAMUSCULAR | Status: DC | PRN
  Administered 2019-03-25: 10 mg via INTRAVENOUS

## 2019-03-25 MED ORDER — IOPAMIDOL (ISOVUE-300) INJECTION 61%
INTRAVENOUS | Status: DC | PRN
  Administered 2019-03-25: 7 mL

## 2019-03-25 MED ORDER — PROPOFOL 10 MG/ML IV BOLUS
INTRAVENOUS | Status: DC | PRN
  Administered 2019-03-25: 150 mg via INTRAVENOUS

## 2019-03-25 MED ORDER — INDOMETHACIN 50 MG RE SUPP
RECTAL | Status: AC
  Filled 2019-03-25: qty 2

## 2019-03-25 MED ORDER — GLUCAGON HCL RDNA (DIAGNOSTIC) 1 MG IJ SOLR
INTRAMUSCULAR | Status: DC | PRN
  Administered 2019-03-25 (×2): 0.25 mg via INTRAVENOUS
  Administered 2019-03-25: .25 mg via INTRAVENOUS

## 2019-03-25 MED ORDER — EPHEDRINE SULFATE 50 MG/ML IJ SOLN
INTRAMUSCULAR | Status: DC | PRN
  Administered 2019-03-25: 10 mg via INTRAVENOUS
  Administered 2019-03-25: 5 mg via INTRAVENOUS
  Administered 2019-03-25: 10 mg via INTRAVENOUS

## 2019-03-25 MED ORDER — CIPROFLOXACIN IN D5W 400 MG/200ML IV SOLN
INTRAVENOUS | Status: DC | PRN
  Administered 2019-03-25: 400 mg via INTRAVENOUS

## 2019-03-25 MED ORDER — SODIUM CHLORIDE 0.9 % IV SOLN: 1.5000 g | Freq: Once | INTRAVENOUS | Status: DC

## 2019-03-25 MED ORDER — FENTANYL CITRATE (PF) 250 MCG/5ML IJ SOLN
INTRAMUSCULAR | Status: DC | PRN
  Administered 2019-03-25: 100 ug via INTRAVENOUS

## 2019-03-25 MED ORDER — CIPROFLOXACIN IN D5W 400 MG/200ML IV SOLN
INTRAVENOUS | Status: AC
  Filled 2019-03-25: qty 200

## 2019-03-25 MED ORDER — LACTATED RINGERS IV SOLN
INTRAVENOUS | Status: DC
  Administered 2019-03-25 (×2): via INTRAVENOUS

## 2019-03-25 MED ORDER — CIPROFLOXACIN HCL 500 MG PO TABS: 500.0000 mg | ORAL_TABLET | Freq: Two times a day (BID) | ORAL | 0 refills | Status: AC

## 2019-03-25 MED ORDER — SODIUM CHLORIDE 0.9 % IV SOLN
INTRAVENOUS | Status: DC | PRN
  Administered 2019-03-25: 20 mL

## 2019-03-25 NOTE — Anesthesia Preprocedure Evaluation (Signed)
Anesthesia Evaluation  Patient identified by MRN, date of birth, ID band Patient awake    Reviewed: Allergy & Precautions, NPO status , Patient's Chart, lab work & pertinent test results, reviewed documented beta blocker date and time   Airway Mallampati: II  TM Distance: >3 FB Neck ROM: Full    Dental no notable dental hx.    Pulmonary COPD, Current Smoker and Patient abstained from smoking.,    Pulmonary exam normal breath sounds clear to auscultation + wheezing      Cardiovascular hypertension, Pt. on medications and Pt. on home beta blockers + CAD, + Past MI, + CABG and + Peripheral Vascular Disease  Normal cardiovascular exam Rhythm:Regular Rate:Normal     Neuro/Psych negative neurological ROS     GI/Hepatic Neg liver ROS, GERD  ,  Endo/Other  negative endocrine ROS  Renal/GU negative Renal ROS     Musculoskeletal   Abdominal   Peds  Hematology negative hematology ROS (+)   Anesthesia Other Findings   Reproductive/Obstetrics                             Lab Results  Component Value Date   WBC 10.1 04/07/2016   HGB 12.1 (L) 04/07/2016   HCT 37.0 (L) 04/07/2016   MCV 94.6 04/07/2016   PLT 240 04/07/2016    Anesthesia Physical  Anesthesia Plan  ASA: III  Anesthesia Plan: General   Post-op Pain Management:    Induction: Intravenous  PONV Risk Score and Plan: 1 and Treatment may vary due to age or medical condition and Ondansetron  Airway Management Planned: Oral ETT  Additional Equipment:   Intra-op Plan:   Post-operative Plan: Extubation in OR  Informed Consent: I have reviewed the patients History and Physical, chart, labs and discussed the procedure including the risks, benefits and alternatives for the proposed anesthesia with the patient or authorized representative who has indicated his/her understanding and acceptance.     Dental advisory given  Plan  Discussed with: CRNA  Anesthesia Plan Comments:         Anesthesia Quick Evaluation

## 2019-03-25 NOTE — Transfer of Care (Signed)
Immediate Anesthesia Transfer of Care Note  Patient: Clifford Jones  Procedure(s) Performed: UPPER ENDOSCOPIC ULTRASOUND (EUS) RADIAL (N/A ) ENDOSCOPIC RETROGRADE CHOLANGIOPANCREATOGRAPHY (ERCP) WITH PROPOFOL (N/A ) ESOPHAGOGASTRODUODENOSCOPY (EGD) WITH PROPOFOL (N/A ) UPPER ENDOSCOPIC ULTRASOUND (EUS) LINEAR  Patient Location: Endoscopy Unit  Anesthesia Type:General  Level of Consciousness: awake, alert  and oriented  Airway & Oxygen Therapy: Patient Spontanous Breathing and Patient connected to face mask oxygen  Post-op Assessment: Report given to RN, Post -op Vital signs reviewed and stable and Patient moving all extremities X 4  Post vital signs: Reviewed and stable  Last Vitals:  Vitals Value Taken Time  BP    Temp    Pulse 68 03/25/19 1430  Resp 17 03/25/19 1430  SpO2 98 % 03/25/19 1430  Vitals shown include unvalidated device data.  Last Pain:  Vitals:   03/25/19 1113  TempSrc: Temporal  PainSc: 0-No pain         Complications: No apparent anesthesia complications

## 2019-03-25 NOTE — Op Note (Signed)
[facility] Patient Name: Clifford Jones Procedure Date : 03/25/2019 MRN: [id number] Attending MD: Justice Britain , MD Date of Birth: 08-04-1950 CSN: [id number] Age: 68 Admit Type: Outpatient Procedure:                ERCP Indications:              Periampullary tumor Providers:                Justice Britain, MD, Carlyn Reichert, RN, Cletis Athens, Technician, Tamala Fothergill, CRNA Referring MD:             Hildred Laser, MD, Milus Banister, MD Medicines:                General Anesthesia, Cipro 400 mg IV, Indomethacin                            100 mg PR, Glucagon 8.11 mg IV Complications:            No immediate complications. Estimated Blood Loss:     Estimated blood loss: none. Procedure:                Pre-Anesthesia Assessment:                           - Prior to the procedure, a History and Physical                            was performed, and patient medications and                            allergies were reviewed. The patient's tolerance of                            previous anesthesia was also reviewed. The risks                            and benefits of the procedure and the sedation                            options and risks were discussed with the patient.                            All questions were answered, and informed consent                            was obtained. Prior Anticoagulants: The patient has                            taken no previous anticoagulant or antiplatelet                            agents except for aspirin. ASA Grade Assessment:  III - A patient with severe systemic disease. After                            reviewing the risks and benefits, the patient was                            deemed in satisfactory condition to undergo the                            procedure.                           After obtaining informed consent, the scope was   passed under direct vision. Throughout the                            procedure, the patient's blood pressure, pulse, and                            oxygen saturations were monitored continuously. The                            TJF-Q180V ([id number]) Olympus Duodensocope was                            introduced through the mouth, and used to inject                            contrast into and used to inject contrast into the                            bile duct. The ERCP was accomplished without                            difficulty. The patient tolerated the procedure. Scope In: Scope Out: Findings:      The scout film was normal.      The esophagus was successfully intubated under direct vision without       detailed examination of the pharynx, larynx, and associated structures,       and upper GI tract. A small frondlike/villous adenoma measuring sixteen       mm in diameter was found at the major papilla.      A short 0.025 inch Revolution Jagwire was passed into the ventral       pancreatic duct. The ventral pancreatic duct was then deeply cannulated       with the Jagtome sphincterotome. Contrast was injected. I personally       interpreted the pancreatic duct images. Ductal flow of contrast was       adequate. Image quality was adequate. Contrast extended to the       pancreatic duct. Opacification of the entire pancreatic ductal system       was successful. The maximum diameter of the ducts was 2 mm. A 3 mm       ventral pancreatic sphincterotomy was made with a monofilament Jagtome       sphincterotome using ERBE electrocautery. There was no  post-sphincterotomy bleeding.      A short 0.025 inch Revolution Antonietta Breach was passed into the biliary tree.       The Jagtome sphincterotome was passed over the guidewire and the bile       duct was then deeply cannulated. Contrast was injected. I personally       interpreted the bile duct images. Ductal flow of contrast was adequate.        Image quality was adequate. Contrast extended to the entire biliary       tree. Opacification of the entire biliary tree except for the       gallbladder was successful. The maximum diameter of the ducts was 4 mm.       The entire opacified area was normal. A 5 mm biliary sphincterotomy was       made with a monofilament Jagtome sphincterotome using ERBE       electrocautery. There was no post-sphincterotomy bleeding.      We proceeded to the ampullectomy attempt. Area was successfully injected       with 10-15 mL Orise gel through the ERCP scope for a lift polypectomy.       Using a snare, the major papilla was grasped. The papilla was then       resected using ERBE electrocautery. Resection and retrieval were       complete.      A short 0.025 inch Revolution Jagwire was passed into the ventral       pancreatic duct. One 4 Fr by 7 cm temporary plastic pancreatic stent       with a single external pigtail was placed into the ventral pancreatic       duct. The stent was in good position.      The duodenoscope was withdrawn from the patient. Impression:               - The major papilla had an adenoma.                           - An area successfully injected.                           - A pancreatic sphincterotomy was performed.                           - A biliary sphincterotomy was performed.                           - Snare papillectomy of the major papilla was                            performed. Resection and retrieval were complete.                           - One temporary plastic pancreatic stent was placed                            into the ventral pancreatic duct to decrease                            post-ampullectomy pancreatitis. Recommendation:           -  The patient will be observed post-procedure,                            until all discharge criteria are met.                           - Discharge patient to home vs Inpatient                            Observation  based on how the patient awakens today                            post-anesthesia.                           - Observe patient's clinical course.                           - Avoid aspirin for next 48 hours and nonsteroidal                            anti-inflammatory medicines for next 1-week to                            decrease risk of delayed bleeding.                           - Clear liquid diet today. Advance diet as                            tolerated after lunch tomorrow if he is doing well.                           - Ciprofloxacin 500 mg BID x 3-days to decrease                            risk of post-ERCP infectious complications (Rx to                            be sent to pharmacy).                           - Watch for pancreatitis, bleeding, perforation,                            and cholangitis.                           - Patient will need a KUB 2-view in 10-14 days to                            ensure pancreatic stent has migrated successfully.                            If still present at that  time will need to be                            scheduled for EGD with stent pull.                           - ERCP in 3-4 months for re-evaluation of region                            and work on any recurrent polyp/adenomatous tissue                            if present. Then will require follow up                            surveillance but will decide on that after first                            follow up.                           - The findings and recommendations were discussed                            with the patient.                           - The findings and recommendations were discussed                            with the patient's family. Procedure Code(s):        --- Professional ---                           [phone], Endoscopic retrograde                            cholangiopancreatography (ERCP); with placement of                            endoscopic  stent into biliary or pancreatic duct,                            including pre- and post-dilation and guide wire                            passage, when performed, including sphincterotomy,                            when performed, each stent                           43251, Esophagogastroduodenoscopy, flexible,                            transoral; with removal of tumor(s), polyp(s), or  other lesion(s) by snare technique                           [id number], Unlisted procedure, biliary tract Diagnosis Code(s):        --- Professional ---                           D49.0, Neoplasm of unspecified behavior of                            digestive system                           K83.8, Other specified diseases of biliary tract CPT copyright 2019 [employer]. All rights reserved. The codes documented in this report are preliminary and upon coder review may  be revised to meet current compliance requirements. Justice Britain, MD 03/25/2019 3:00:13 PM Number of Addenda: 0

## 2019-03-25 NOTE — Progress Notes (Signed)
While in recovery, pt noted to desat when talking and moving around recovery area. No signs of shortness of breath or distress. Informed MD Mansouraty. Walked patient down hallway with O2 sat, pt desats to 60's-70's when ambulatory. Assisted pt back to recovery room and notified MD Mansouraty of assessment. Pt still has no signs of shortness of breath or distress and states this is "normal for him." Placed on 2L [place]. MD to come to bedside.

## 2019-03-25 NOTE — Anesthesia Procedure Notes (Signed)
Procedure Name: Intubation Date/Time: 03/25/2019 12:17 PM Performed by: Mariea Clonts, CRNA Pre-anesthesia Checklist: Patient identified, Emergency Drugs available, Suction available and Patient being monitored Patient Re-evaluated:Patient Re-evaluated prior to induction Oxygen Delivery Method: Circle System Utilized Preoxygenation: Pre-oxygenation with 100% oxygen Induction Type: IV induction Ventilation: Mask ventilation without difficulty and Oral airway inserted - appropriate to patient size Laryngoscope Size: Mac and 4 Grade View: Grade II Tube type: Oral Tube size: 7.5 mm Number of attempts: 1 Airway Equipment and Method: Stylet and Oral airway Placement Confirmation: ETT inserted through vocal cords under direct vision,  positive ETCO2 and breath sounds checked- equal and bilateral Tube secured with: Tape Dental Injury: Teeth and Oropharynx as per pre-operative assessment

## 2019-03-25 NOTE — Op Note (Signed)
[facility] Patient Name: Clifford Jones Procedure Date : 03/25/2019 MRN: [id number] Attending MD: Justice Britain , MD Date of Birth: 1950/10/09 CSN: [id number] Age: 68 Admit Type: Outpatient Procedure:                Upper EUS Indications:              Mucosal mass/polyp involving the major papilla                            (found on endoscopy) Providers:                Justice Britain, MD, Carlyn Reichert, RN, Cletis Athens, Technician, [place] Huel Cote, CRNA Referring MD:             Hildred Laser, MD, Milus Banister, MD Medicines:                General Anesthesia Complications:            No immediate complications. Estimated Blood Loss:     Estimated blood loss was minimal. Procedure:                Pre-Anesthesia Assessment:                           - Prior to the procedure, a History and Physical                            was performed, and patient medications and                            allergies were reviewed. The patient's tolerance of                            previous anesthesia was also reviewed. The risks                            and benefits of the procedure and the sedation                            options and risks were discussed with the patient.                            All questions were answered, and informed consent                            was obtained. Prior Anticoagulants: The patient has                            taken no previous anticoagulant or antiplatelet                            agents except for aspirin. ASA Grade Assessment:  III - A patient with severe systemic disease. After                            reviewing the risks and benefits, the patient was                            deemed in satisfactory condition to undergo the                            procedure.                           After obtaining informed consent, the endoscope was       passed under direct vision. Throughout the                            procedure, the patient's blood pressure, pulse, and                            oxygen saturations were monitored continuously. The                            [id number]-H190 [id number]) Olympus gastroscope was                            introduced through the mouth, and advanced to the                            second part of duodenum. The TJF-Q180V [id number])                            [place] was introduced through the                            mouth, and advanced to the second part of duodenum.                            The GF-[id number]-AL5 [id number]) Olympus Radial EUS scope                            was introduced through the mouth, and advanced to                            the duodenum for ultrasound examination from the                            stomach and duodenum. The GF-UTC180 [id number])                            Olympus Linear EUS scope was introduced through the                            mouth, and advanced to the duodenum for ultrasound  examination from the stomach and duodenum. The                            upper EUS was accomplished without difficulty. The                            patient tolerated the procedure. Scope In: Scope Out: Findings:      ENDOSCOPIC FINDING: :      No gross mucosal lesions were noted in the entire esophagus.      A medium-sized hiatal hernia was present.      Multiple dispersed, small non-bleeding erosions were found in the       gastric fundus, in the gastric body and in the gastric antrum. There       were no stigmata of recent bleeding. Biopsies were taken with a cold       forceps for histology and Helicobacter pylori testing.      No gross lesions were noted in the duodenal bulb, in the first portion       of the duodenum and in the second portion of the duodenum.      A single 16 mm semi-sessile polyp with no bleeding was found in  the       ampulla.      ENDOSONOGRAPHIC FINDING: :      A hypoechoic villous lesion was identified endosonographically in the       ampulla. The lesion measured 16 x 10 mm in diameter. The lesion extended       from the mucosa to the submucosa. The outer margins were irregular.      There was no sign of significant endosonographic abnormality in the       common bile duct. No stones and ducts of normal caliber were identified.       The CBD was 3.9 mm -> 4.3 mm.      Moderate hyperechoic material consistent with sludge was visualized       endosonographically in the gallbladder body.      There was no sign of significant endosonographic abnormality in the       pancreatic head (PD = 1.4 mm -> 2.1 mm), genu of the pancreas (PD = 1.0       mm), pancreatic body (PD = 0.5 mm) and pancreatic tail (PD = 0.3 mm). No       masses, no cysts, no calcifications, the pancreatic duct was thin in       caliber.      Endosonographic imaging in the visualized portion of the liver showed no       mass.      No malignant-appearing lymph nodes were visualized in the celiac region       (level 20), peripancreatic region and porta hepatis region.      The celiac region was visualized. Impression:               EGD Impression:                           - No gross mucosal lesions in esophagus.                            Medium-sized hiatal hernia.                           -  Non-bleeding erosive gastropathy. Biopsied.                           - No gross lesions in the duodenal bulb, in the                            first portion of the duodenum and in the second                            portion of the duodenum.                           - A single duodenal polyp.                           EUS IMpression:                           - A lesion was found in the ampulla. A tissue                            diagnosis was obtained prior to this exam. This is                            consistent with an  adenoma.                           - There was no sign of significant pathology in the                            common bile duct.                           - Hyperechoic material consistent with sludge was                            visualized endosonographically in the gallbladder                            body.                           - There was no sign of significant pathology in the                            pancreatic head, genu of the pancreas, pancreatic                            body and pancreatic tail.                           - No malignant-appearing lymph nodes were                            visualized in the celiac region (level 20),  peripancreatic region and porta hepatis region. Recommendation:           - Proceed to scheduled ERCP for attempt at                            ampullectomy.                           - Continue BID PPI.                           - Follow up Pathology.                           - The findings and recommendations were discussed                            with the patient.                           - The findings and recommendations were discussed                            with the patient's family. Procedure Code(s):        --- Professional ---                           [id number], Esophagogastroduodenoscopy, flexible,                            transoral; with endoscopic ultrasound examination                            limited to the esophagus, stomach or duodenum, and                            adjacent structures                           43239, Esophagogastroduodenoscopy, flexible,                            transoral; with biopsy, single or multiple Diagnosis Code(s):        --- Professional ---                           K44.9, Diaphragmatic hernia without obstruction or                            gangrene                           K31.89, Other diseases of stomach and duodenum                            K31.7, Polyp of stomach and duodenum  K83.8, Other specified diseases of biliary tract                           I89.9, Noninfective disorder of lymphatic vessels                            and lymph nodes, unspecified                           K83.9, Disease of biliary tract, unspecified CPT copyright 2019 American [employer] Association. All rights reserved. The codes documented in this report are preliminary and upon coder review may  be revised to meet current compliance requirements. Justice Britain, MD 03/25/2019 2:46:46 PM Number of Addenda: 0

## 2019-03-25 NOTE — H&P (Signed)
GASTROENTEROLOGY PROCEDURE H&P NOTE   Primary Care Physician: Lavella Lemons, PA  HPI: Clifford Jones is a 68 y.o. male who presents for EUS/ERCP with attempt at ampullectomy.  Past Medical History:  Diagnosis Date   Ampullary adenoma    Claustrophobia    Closed fracture of right distal tibia    Coronary artery disease    a. 1999 Inf MI;  b. 2003 CABGx6;  c. 2008 NSTEMI, occluded SVG to marginal and diagonal system. 2 DES placed to marginal system;  c. NSTEMI - 02/2012 Xience Xpedition DES to oS-OM and POBA to mid lesion;  c. 10/2013 NSTEMI/PCI: VG->OM 80 ISR, native OM 80/90 small, LIMA->LAD ok w/ 90 dLAD, D1 80, RIMA->RPL ok w/ 95 in RPL (2.5x14 Resolute DES), nl EF.   GERD (gastroesophageal reflux disease)    Hyperlipidemia    Hypertension    Myocardial infarction ([place])    X 6   S/P AAA repair    Tobacco abuse    Wears dentures    Wears glasses    Past Surgical History:  Procedure Laterality Date   ABDOMINAL AORTIC ANEURYSM REPAIR  2006   APPENDECTOMY     CORONARY ANGIOPLASTY WITH STENT PLACEMENT     CORONARY ARTERY BYPASS GRAFT  2003   ESOPHAGEAL DILATION  07/06/2018   Procedure: ESOPHAGEAL DILATION;  Surgeon: Rogene Houston, MD;  [facility];  Service: Endoscopy;;   ESOPHAGOGASTRODUODENOSCOPY N/A 07/06/2018   Procedure: ESOPHAGOGASTRODUODENOSCOPY (EGD);  Surgeon: Rogene Houston, MD;  [facility];  Service: Endoscopy;  Laterality: N/A;   ESOPHAGOGASTRODUODENOSCOPY N/A 08/16/2018   Procedure: ESOPHAGOGASTRODUODENOSCOPY (EGD);  Surgeon: Milus Banister, MD;  Location: [facility];  Service: Endoscopy;  Laterality: N/A;   EUS N/A 08/16/2018   Procedure: UPPER ENDOSCOPIC ULTRASOUND (EUS) RADIAL;  Surgeon: Milus Banister, MD;  Location: [facility];  Service: Endoscopy;  Laterality: N/A;   FOREIGN BODY REMOVAL  07/06/2018   Procedure: FOREIGN BODY REMOVAL;  Surgeon: Rogene Houston, MD;  [facility];  Service:  Endoscopy;;   HERNIA REPAIR     umbicical   LEFT HEART CATHETERIZATION WITH CORONARY ANGIOGRAM N/A 03/05/2012   Procedure: LEFT HEART CATHETERIZATION WITH CORONARY ANGIOGRAM;  Surgeon: Hillary Bow, MD;  Location: [facility] CATH LAB;  Service: Cardiovascular;  Laterality: N/A;   LEFT HEART CATHETERIZATION WITH CORONARY ANGIOGRAM N/A 10/23/2013   Procedure: LEFT HEART CATHETERIZATION WITH CORONARY ANGIOGRAM;  Surgeon: Blane Ohara, MD;  Location: [facility] CATH LAB;  Service: Cardiovascular;  Laterality: N/A;   LEFT HEART CATHETERIZATION WITH CORONARY/GRAFT ANGIOGRAM  10/22/2013   Procedure: LEFT HEART CATHETERIZATION WITH Beatrix Fetters;  Surgeon: Leonie Man, MD;  Location: [facility] CATH LAB;  Service: Cardiovascular;;   PERCUTANEOUS CORONARY STENT INTERVENTION (PCI-S) N/A 03/07/2012   Procedure: PERCUTANEOUS CORONARY STENT INTERVENTION (PCI-S);  Surgeon: Sherren Mocha, MD;  Location: [facility] CATH LAB;  Service: Cardiovascular;  Laterality: N/A;   PERCUTANEOUS CORONARY STENT INTERVENTION (PCI-S) Right 10/23/2013   Procedure: PERCUTANEOUS CORONARY STENT INTERVENTION (PCI-S);  Surgeon: Blane Ohara, MD;  Location: [facility] CATH LAB;  Service: Cardiovascular;  Laterality: Right;   POLYPECTOMY  07/06/2018   Procedure: POLYPECTOMY;  Surgeon: Rogene Houston, MD;  [facility];  Service: Endoscopy;;  polyp at ampulla   Right hand surgery     TIBIA IM NAIL INSERTION Right 04/07/2016   Procedure: FIXATION OF RIGHT TIBIA FRACTURE;  Surgeon: Garald Balding, MD;  Location: [facility];  Service: Orthopedics;  Laterality:  Right;   Current Facility-Administered Medications  Medication Dose Route Frequency Provider Last Rate Last Dose   0.9 %  sodium chloride infusion   Intravenous Continuous Mansouraty, Telford Nab., MD       lactated ringers infusion   Intravenous Continuous Mansouraty, Telford Nab., MD       No Known Allergies Family History  Problem Relation Age of Onset   Coronary artery  disease Other    Colon cancer Neg Hx    Esophageal cancer Neg Hx    Inflammatory bowel disease Neg Hx    Liver disease Neg Hx    Pancreatic cancer Neg Hx    Rectal cancer Neg Hx    Stomach cancer Neg Hx    Social History   Socioeconomic History   Marital status: Legally Separated    Spouse name: Not on file   Number of children: Not on file   Years of education: Not on file   Highest education level: Not on file  Occupational History   Not on file  Social Needs   Financial resource strain: Not on file   Food insecurity    Worry: Not on file    Inability: Not on file   Transportation needs    Medical: Not on file    Non-medical: Not on file  Tobacco Use   Smoking status: Current Some Day Smoker    Packs/day: 1.00    Years: 54.00    Pack years: 54.00    Types: Cigarettes    Start date: 10/04/1964   Smokeless tobacco: Never Used  Substance and Sexual Activity   Alcohol use: Yes    Comment: Maybe 2 beers a month and 2 shotsa month   Drug use: No   Sexual activity: Yes  Lifestyle   Physical activity    Days per week: Not on file    Minutes per session: Not on file   Stress: Not on file  Relationships   Social connections    Talks on phone: Not on file    Gets together: Not on file    Attends religious service: Not on file    Active member of club or organization: Not on file    Attends meetings of clubs or organizations: Not on file    Relationship status: Not on file   Intimate partner violence    Fear of current or ex partner: Not on file    Emotionally abused: Not on file    Physically abused: Not on file    Forced sexual activity: Not on file  Other Topics Concern   Not on file  Social History Narrative   Disabled.  Former truck Software engineer. Divorced and remarried x 2          Physical Exam: Vital signs in last 24 hours: Temp:  [97.8 F (36.6 C)] 97.8 F (36.6 C) (10/05 1113) Pulse Rate:  [63] 63 (10/05  1113) Resp:  [19] 19 (10/05 1113) BP: (160)/(74) 160/74 (10/05 1113) SpO2:  [91 %] 91 % (10/05 1113) Weight:  [97.5 kg] 97.5 kg (10/05 1113)   GEN: NAD EYE: Sclerae anicteric ENT: MMM CV: Non-tachycardic GI: Soft, NT/ND NEURO:  Alert & Oriented x 3  Lab Results: Recent Labs    03/25/19 1204  HGB 16.7  HCT 49.0   BMET Recent Labs    03/25/19 1204  NA 140  K 3.9  CL 99  GLUCOSE 115*  BUN 19  CREATININE 0.80   LFT No results  for input(s): PROT, ALBUMIN, AST, ALT, ALKPHOS, BILITOT, BILIDIR, IBILI in the last 72 hours. PT/INR No results for input(s): LABPROT, INR in the last 72 hours.   Impression / Plan: This is a 68 y.o.male who presents for EUS/ERCP with attempt at ampullectomy.  The risks of EUS including bleeding, infection, aspiration pneumonia and intestinal perforation were discussed as was the possibility it may not give a definitive diagnosis.  If a biopsy of the pancreas is done as part of the EUS, there is an additional risk of pancreatitis at the rate of about 1%.  It was explained that procedure related pancreatitis is typically mild, although can be severe and even life threatening, which is why we do not perform random pancreatic biopsies and only biopsy a lesion we feel is concerning enough to warrant the risk.  The risks of an ERCP were discussed at length, including but not limited to the risk of perforation, bleeding, abdominal pain, post-ERCP pancreatitis (while usually mild can be severe and even life threatening).  The risks and benefits of endoscopic evaluation were discussed with the patient; these include but are not limited to the risk of perforation, infection, bleeding, missed lesions, lack of diagnosis, severe illness requiring hospitalization, as well as anesthesia and sedation related illnesses.  The patient is agreeable to proceed.    Justice Britain, MD [facility] # [id number]

## 2019-03-25 NOTE — Discharge Instructions (Signed)
YOU HAD AN ENDOSCOPIC PROCEDURE TODAY: Refer to the procedure report and other information in the discharge instructions given to you for any specific questions about what was found during the examination. If this information does not answer your questions, please call [place] office at [phone] to clarify.   YOU SHOULD EXPECT: Some feelings of bloating in the abdomen. Passage of more gas than usual. Walking can help get rid of the air that was put into your GI tract during the procedure and reduce the bloating. If you had a lower endoscopy (such as a colonoscopy or flexible sigmoidoscopy) you may notice spotting of blood in your stool or on the toilet paper. Some abdominal soreness may be present for a day or two, also.  DIET: Your first meal following the procedure should be a light meal and then it is ok to progress to your normal diet. A half-sandwich or bowl of soup is an example of a good first meal. Heavy or fried foods are harder to digest and may make you feel nauseous or bloated. Drink plenty of fluids but you should avoid alcoholic beverages for 24 hours. If you had a esophageal dilation, please see attached instructions for diet.    ACTIVITY: Your care partner should take you home directly after the procedure. You should plan to take it easy, moving slowly for the rest of the day. You can resume normal activity the day after the procedure however YOU SHOULD NOT DRIVE, use power tools, machinery or perform tasks that involve climbing or major physical exertion for 24 hours (because of the sedation medicines used during the test).   SYMPTOMS TO REPORT IMMEDIATELY: A gastroenterologist can be reached at any hour. Please call [phone]  for any of the following symptoms:   Following upper endoscopy (EGD, EUS, ERCP, esophageal dilation) Vomiting of blood or coffee ground material  New, significant abdominal pain  New, significant chest pain or pain under the shoulder blades  Painful or  persistently difficult swallowing  New shortness of breath  Black, tarry-looking or red, bloody stools  FOLLOW UP:  If any biopsies were taken you will be contacted by phone or by letter within the next 1-3 weeks. Call [phone]  if you have not heard about the biopsies in 3 weeks.  Please also call with any specific questions about appointments or follow up tests.  

## 2019-03-25 NOTE — Progress Notes (Signed)
Patient's preprocedure SPO2 was 91%.  After the procedure patient was evaluated on multiple occasions with no evidence of abdominal pain concerning for pancreatitis which I was very happy with.  However, patient did have mild hypoxemia noted during his post procedure time.  If he was resting and not moving the patient had SPO2 was as high as 99% however if he walked or ambulated or spoke significantly his SPO2 would drop into the 80s and slowly make their way up.  Patient denies any sensation of shortness of breath and feels just as well as when he came in for the procedure.  He was supposed to be on home O2 at night per his daughter's report at some point in the past but not been on that.  He is not on significant COPD medications/inhalers.  By the end of the follow-up time post procedure his SPO2 was 99%.  Long conversation with the patient and patient's daughter and we decided that due to his stability and his improvement in SPO2 that close monitoring and follow-up with his PCP (his daughter works for his PCPs office) should be had this week potentially on Tuesday versus on Thursday.  The 2 of them know that if he were to develop signs or symptoms of pancreatitis or bleeding to come to call.  If he were to develop signs or symptoms of progressive shortness of breath or dyspnea on exertion he will return to the hospital to be evaluated further.  The patient and daughter agree with this plan of action and we will move forward with discharge with close follow-up as needed.  Justice Britain, MD [facility] # [phone]

## 2019-03-26 ENCOUNTER — Telehealth: Payer: Self-pay

## 2019-03-26 LAB — SURGICAL PATHOLOGY

## 2019-03-26 NOTE — Telephone Encounter (Signed)
Clifford Jones, Good to hear he is mostly doing well. Would direct him to use Chloraseptic and/or Cepacol lozenges. We will update him when his pathology returns. Thanks. GM

## 2019-03-26 NOTE — Progress Notes (Signed)
Attempted, unable to leave message 

## 2019-03-26 NOTE — Telephone Encounter (Signed)
-----   Message from Irving Copas., MD sent at 03/26/2019  8:30 AM EDT ----- Regarding: Follow-up Covering RN for Clifford Jones,Please call this patient today to evaluate and see how he is doing post ERCP with ampullectomy.Please let me know how he is doing and if any signs or symptoms of pancreatitis.Thank you.GM

## 2019-03-26 NOTE — Anesthesia Postprocedure Evaluation (Signed)
Anesthesia Post Note  Patient: Clifford Jones  Procedure(s) Performed: UPPER ENDOSCOPIC ULTRASOUND (EUS) RADIAL (N/A ) ENDOSCOPIC RETROGRADE CHOLANGIOPANCREATOGRAPHY (ERCP) WITH PROPOFOL (N/A ) ESOPHAGOGASTRODUODENOSCOPY (EGD) WITH PROPOFOL (N/A ) UPPER ENDOSCOPIC ULTRASOUND (EUS) LINEAR     Patient location during evaluation: PACU Anesthesia Type: General Level of consciousness: awake and alert Pain management: pain level controlled Vital Signs Assessment: post-procedure vital signs reviewed and stable Respiratory status: spontaneous breathing, nonlabored ventilation and respiratory function stable Cardiovascular status: blood pressure returned to baseline and stable Postop Assessment: no apparent nausea or vomiting Anesthetic complications: no    Last Vitals:  Vitals:   03/25/19 1610 03/25/19 1620  BP:    Pulse:    Resp:    Temp:    SpO2: 92% 98%    Last Pain:  Vitals:   03/25/19 1450  TempSrc:   PainSc: 0-No pain                 Lynda Rainwater

## 2019-03-26 NOTE — Telephone Encounter (Signed)
Spoke with pt and he reports he is doing great, no abd pain or fever. Only complaint is a little scratchy throat. Dr. Jannifer Rodney aware.

## 2019-03-27 ENCOUNTER — Encounter (HOSPITAL_COMMUNITY): Payer: Self-pay | Admitting: Gastroenterology

## 2019-03-29 ENCOUNTER — Encounter: Payer: Self-pay | Admitting: Gastroenterology

## 2019-04-20 DIAGNOSIS — Z23 Encounter for immunization: Secondary | ICD-10-CM | POA: Diagnosis not present

## 2019-05-03 ENCOUNTER — Telehealth: Payer: Self-pay

## 2019-05-03 DIAGNOSIS — Z9889 Other specified postprocedural states: Secondary | ICD-10-CM

## 2019-05-03 NOTE — Telephone Encounter (Signed)
Thanks for update. I am OK with this but will be beneficial once complete for [place] to get the actual X-Ray imaging (if they do not see a stent present any more) to ensure that it is truly gone. Thanks. GM

## 2019-05-03 NOTE — Telephone Encounter (Signed)
I spoke with the pt's daughter and she will have the xray completed at her employers office. (she works for the pt's primary care) She will have the result faxed to Dr Rush Landmark  attention.

## 2019-05-03 NOTE — Telephone Encounter (Signed)
Message sent to the pt's daughter to have the images sent directly to our office.

## 2019-05-03 NOTE — Telephone Encounter (Signed)
-----   Message from Irving Copas., MD sent at 05/03/2019  2:50 AM EST ----- Regarding: Follow up Clifford Jones,I do not see that the patient never came in to get his follow-up KUB to ensure the pancreas duct stent is fallen out.Can you call and check with the patient and get this taken care of in the next week or 2.Please let me know what he decides or what you hear from the patient.Thanks.GM

## 2019-05-08 DIAGNOSIS — Z6830 Body mass index (BMI) 30.0-30.9, adult: Secondary | ICD-10-CM | POA: Diagnosis not present

## 2019-05-08 DIAGNOSIS — M25512 Pain in left shoulder: Secondary | ICD-10-CM | POA: Diagnosis not present

## 2019-05-08 DIAGNOSIS — Z9689 Presence of other specified functional implants: Secondary | ICD-10-CM | POA: Diagnosis not present

## 2019-05-08 DIAGNOSIS — M19019 Primary osteoarthritis, unspecified shoulder: Secondary | ICD-10-CM | POA: Diagnosis not present

## 2019-05-31 ENCOUNTER — Other Ambulatory Visit: Payer: Self-pay

## 2019-05-31 ENCOUNTER — Encounter (HOSPITAL_COMMUNITY): Payer: Self-pay

## 2019-05-31 ENCOUNTER — Inpatient Hospital Stay (HOSPITAL_COMMUNITY)
Admission: EM | Admit: 2019-05-31 | Discharge: 2019-06-04 | DRG: 193 | Disposition: A | Discharge status: Home / Self Care | Payer: Medicare HMO | Attending: Family Medicine | Admitting: Family Medicine

## 2019-05-31 ENCOUNTER — Emergency Department (HOSPITAL_COMMUNITY): Payer: Medicare HMO

## 2019-05-31 DIAGNOSIS — I5032 Chronic diastolic (congestive) heart failure: Secondary | ICD-10-CM | POA: Diagnosis present

## 2019-05-31 DIAGNOSIS — I25810 Atherosclerosis of coronary artery bypass graft(s) without angina pectoris: Secondary | ICD-10-CM | POA: Diagnosis not present

## 2019-05-31 DIAGNOSIS — A419 Sepsis, unspecified organism: Secondary | ICD-10-CM | POA: Diagnosis not present

## 2019-05-31 DIAGNOSIS — R0602 Shortness of breath: Secondary | ICD-10-CM | POA: Diagnosis present

## 2019-05-31 DIAGNOSIS — J440 Chronic obstructive pulmonary disease with acute lower respiratory infection: Secondary | ICD-10-CM | POA: Diagnosis not present

## 2019-05-31 DIAGNOSIS — I110 Hypertensive heart disease with heart failure: Secondary | ICD-10-CM | POA: Diagnosis present

## 2019-05-31 DIAGNOSIS — F17210 Nicotine dependence, cigarettes, uncomplicated: Secondary | ICD-10-CM | POA: Diagnosis present

## 2019-05-31 DIAGNOSIS — I252 Old myocardial infarction: Secondary | ICD-10-CM

## 2019-05-31 DIAGNOSIS — J449 Chronic obstructive pulmonary disease, unspecified: Secondary | ICD-10-CM | POA: Diagnosis not present

## 2019-05-31 DIAGNOSIS — N179 Acute kidney failure, unspecified: Secondary | ICD-10-CM | POA: Diagnosis not present

## 2019-05-31 DIAGNOSIS — I2510 Atherosclerotic heart disease of native coronary artery without angina pectoris: Secondary | ICD-10-CM | POA: Diagnosis not present

## 2019-05-31 DIAGNOSIS — I5040 Unspecified combined systolic (congestive) and diastolic (congestive) heart failure: Secondary | ICD-10-CM | POA: Diagnosis not present

## 2019-05-31 DIAGNOSIS — Z23 Encounter for immunization: Secondary | ICD-10-CM | POA: Diagnosis not present

## 2019-05-31 DIAGNOSIS — Z20828 Contact with and (suspected) exposure to other viral communicable diseases: Secondary | ICD-10-CM | POA: Diagnosis present

## 2019-05-31 DIAGNOSIS — Z20822 Contact with and (suspected) exposure to covid-19: Secondary | ICD-10-CM

## 2019-05-31 DIAGNOSIS — K219 Gastro-esophageal reflux disease without esophagitis: Secondary | ICD-10-CM | POA: Diagnosis present

## 2019-05-31 DIAGNOSIS — E785 Hyperlipidemia, unspecified: Secondary | ICD-10-CM | POA: Diagnosis not present

## 2019-05-31 DIAGNOSIS — Z955 Presence of coronary angioplasty implant and graft: Secondary | ICD-10-CM | POA: Diagnosis not present

## 2019-05-31 DIAGNOSIS — J9601 Acute respiratory failure with hypoxia: Secondary | ICD-10-CM | POA: Diagnosis present

## 2019-05-31 DIAGNOSIS — J189 Pneumonia, unspecified organism: Secondary | ICD-10-CM | POA: Diagnosis not present

## 2019-05-31 DIAGNOSIS — R6520 Severe sepsis without septic shock: Secondary | ICD-10-CM | POA: Diagnosis not present

## 2019-05-31 DIAGNOSIS — Z720 Tobacco use: Secondary | ICD-10-CM | POA: Diagnosis not present

## 2019-05-31 DIAGNOSIS — R918 Other nonspecific abnormal finding of lung field: Secondary | ICD-10-CM | POA: Diagnosis not present

## 2019-05-31 DIAGNOSIS — J441 Chronic obstructive pulmonary disease with (acute) exacerbation: Secondary | ICD-10-CM | POA: Diagnosis not present

## 2019-05-31 HISTORY — DX: Unspecified osteoarthritis, unspecified site: M19.90

## 2019-05-31 HISTORY — DX: Malignant (primary) neoplasm, unspecified: C80.1

## 2019-05-31 HISTORY — DX: Chronic obstructive pulmonary disease, unspecified: J44.9

## 2019-05-31 HISTORY — DX: Pneumonia, unspecified organism: J18.9

## 2019-05-31 HISTORY — DX: Presence of cardiac pacemaker: Z95.0

## 2019-05-31 HISTORY — DX: Dyspnea, unspecified: R06.00

## 2019-05-31 LAB — COMPREHENSIVE METABOLIC PANEL
ALT: 16 U/L (ref 0–44)
AST: 16 U/L (ref 15–41)
Albumin: 3.7 g/dL (ref 3.5–5.0)
Alkaline Phosphatase: 62 U/L (ref 38–126)
Anion gap: 12 (ref 5–15)
BUN: 23 mg/dL (ref 8–23)
CO2: 32 mmol/L (ref 22–32)
Calcium: 9 mg/dL (ref 8.9–10.3)
Chloride: 95 mmol/L — ABNORMAL LOW (ref 98–111)
Creatinine, Ser: 1.24 mg/dL (ref 0.61–1.24)
GFR calc Af Amer: >60 mL/min (ref >60)
GFR calc non Af Amer: >60 mL/min (ref >60)
Glucose, Bld: 144 mg/dL — ABNORMAL HIGH (ref 70–99)
Potassium: 3.8 mmol/L (ref 3.5–5.1)
Sodium: 139 mmol/L (ref 135–145)
Total Bilirubin: 0.5 mg/dL (ref 0.3–1.2)
Total Protein: 7.6 g/dL (ref 6.5–8.1)

## 2019-05-31 LAB — BRAIN NATRIURETIC PEPTIDE: B Natriuretic Peptide: 248 pg/mL — ABNORMAL HIGH (ref 0.0–100.0)

## 2019-05-31 LAB — APTT: aPTT: 24 seconds (ref 24–36)

## 2019-05-31 LAB — PROTIME-INR
INR: 1 (ref 0.8–1.2)
Prothrombin Time: 13.2 seconds (ref 11.4–15.2)

## 2019-05-31 LAB — LACTIC ACID, PLASMA: Lactic Acid, Venous: 0.8 mmol/L (ref 0.5–1.9)

## 2019-05-31 MED ORDER — SODIUM CHLORIDE 0.9 % IV SOLN
2.0000 g | Freq: Once | INTRAVENOUS | Status: AC
  Administered 2019-05-31: 2 g via INTRAVENOUS
  Filled 2019-05-31: qty 20

## 2019-05-31 MED ORDER — SODIUM CHLORIDE 0.9 % IV SOLN
500.0000 mg | INTRAVENOUS | Status: DC
  Administered 2019-06-01 – 2019-06-03 (×4): 500 mg via INTRAVENOUS
  Filled 2019-05-31 (×4): qty 500

## 2019-05-31 MED ORDER — DEXAMETHASONE SODIUM PHOSPHATE 10 MG/ML IJ SOLN
6.0000 mg | Freq: Once | INTRAMUSCULAR | Status: AC
  Administered 2019-05-31: 23:00:00 6 mg via INTRAVENOUS
  Filled 2019-05-31: qty 1

## 2019-05-31 MED ORDER — ALBUTEROL SULFATE HFA 108 (90 BASE) MCG/ACT IN AERS
2.0000 | INHALATION_SPRAY | RESPIRATORY_TRACT | Status: AC
  Administered 2019-05-31 (×2): 2 via RESPIRATORY_TRACT
  Filled 2019-05-31: qty 6.7

## 2019-05-31 NOTE — ED Triage Notes (Signed)
Pt arrives from home c/o of fever reporting temp of 101 F at home. Pt presents with SOB and reports O2 Sats around 85% at home "this is normal for me". During triage Pt presents O2 Sats at 80% Room air, and Pt has productive cough with thick green sputum.

## 2019-05-31 NOTE — ED Notes (Signed)
Pt aware of need for urine specimen. 

## 2019-05-31 NOTE — ED Notes (Signed)
Pt placed on 6L Nasal Cannula in Triage and O2 Sats improved to 98%.

## 2019-05-31 NOTE — ED Provider Notes (Signed)
[facility] Emergency Department Provider Note MRN:  [id number]  Arrival date & time: 06/01/19     Chief Complaint   Fever   History of Present Illness   Clifford Jones is a 68 y.o. year-old male with a history of CAD, COPD presenting to the ED with chief complaint of fever.  Patient reports fever and cough for the past 4 days.  Increased short of breath for the past 2 days.  Denies chest pain.  Productive cough.  Denies headache or vision change, no trouble swallowing, no abdominal pain, no numbness or weakness to the arms or legs.  Symptoms moderate to severe, constant, no exacerbating or alleviating factors.  No leg pain or swelling.  Denies sick contacts.  Review of Systems  A complete 10 system review of systems was obtained and all systems are negative except as noted in the HPI and PMH.   Patient's Health History    Past Medical History:  Diagnosis Date  . Ampullary adenoma   . Claustrophobia   . Closed fracture of right distal tibia   . Coronary artery disease    a. 1999 Inf MI;  b. 2003 CABGx6;  c. 2008 NSTEMI, occluded SVG to marginal and diagonal system. 2 DES placed to marginal system;  c. NSTEMI - 02/2012 Xience Xpedition DES to oS-OM and POBA to mid lesion;  c. 10/2013 NSTEMI/PCI: VG->OM 80 ISR, native OM 80/90 small, LIMA->LAD ok w/ 90 dLAD, D1 80, RIMA->RPL ok w/ 95 in RPL (2.5x14 Resolute DES), nl EF.  Marland Kitchen GERD (gastroesophageal reflux disease)   . Hyperlipidemia   . Hypertension   . Myocardial infarction ([place])    X 6  . S/P AAA repair   . Tobacco abuse   . Wears dentures   . Wears glasses     Past Surgical History:  Procedure Laterality Date  . ABDOMINAL AORTIC ANEURYSM REPAIR  2006  . APPENDECTOMY    . CORONARY ANGIOPLASTY WITH STENT PLACEMENT    . CORONARY ARTERY BYPASS GRAFT  2003  . ENDOSCOPIC RETROGRADE CHOLANGIOPANCREATOGRAPHY (ERCP) WITH PROPOFOL N/A 03/25/2019   Procedure: ENDOSCOPIC RETROGRADE CHOLANGIOPANCREATOGRAPHY (ERCP)  WITH PROPOFOL;  Surgeon: Rush Landmark Telford Nab., MD;  Location: [place];  Service: Gastroenterology;  Laterality: N/A;  . ESOPHAGEAL DILATION  07/06/2018   Procedure: ESOPHAGEAL DILATION;  Surgeon: Rogene Houston, MD;  [facility];  Service: Endoscopy;;  . ESOPHAGOGASTRODUODENOSCOPY N/A 07/06/2018   Procedure: ESOPHAGOGASTRODUODENOSCOPY (EGD);  Surgeon: Rogene Houston, MD;  [facility];  Service: Endoscopy;  Laterality: N/A;  . ESOPHAGOGASTRODUODENOSCOPY N/A 08/16/2018   Procedure: ESOPHAGOGASTRODUODENOSCOPY (EGD);  Surgeon: Milus Banister, MD;  Location[facility];  Service: Endoscopy;  Laterality: N/A;  . ESOPHAGOGASTRODUODENOSCOPY (EGD) WITH PROPOFOL N/A 03/25/2019   Procedure: ESOPHAGOGASTRODUODENOSCOPY (EGD) WITH PROPOFOL;  Surgeon: Rush Landmark Telford Nab., MD;  Location: [place];  Service: Gastroenterology;  Laterality: N/A;  . EUS N/A 08/16/2018   Procedure: UPPER ENDOSCOPIC ULTRASOUND (EUS) RADIAL;  Surgeon: Milus Banister, MD;  Location: [facility];  Service: Endoscopy;  Laterality: N/A;  . EUS N/A 03/25/2019   Procedure: UPPER ENDOSCOPIC ULTRASOUND (EUS) RADIAL;  Surgeon: Rush Landmark Telford Nab., MD;  Location: [place];  Service: Gastroenterology;  Laterality: N/A;  . FOREIGN BODY REMOVAL  07/06/2018   Procedure: FOREIGN BODY REMOVAL;  Surgeon: Rogene Houston, MD;  [facility];  Service: Endoscopy;;  . HERNIA REPAIR     umbicical  . LEFT HEART CATHETERIZATION WITH CORONARY ANGIOGRAM N/A 03/05/2012  Procedure: LEFT HEART CATHETERIZATION WITH CORONARY ANGIOGRAM;  Surgeon: Hillary Bow, MD;  Location: [facility] CATH LAB;  Service: Cardiovascular;  Laterality: N/A;  . LEFT HEART CATHETERIZATION WITH CORONARY ANGIOGRAM N/A 10/23/2013   Procedure: LEFT HEART CATHETERIZATION WITH CORONARY ANGIOGRAM;  Surgeon: Blane Ohara, MD;  Location: [facility] CATH LAB;  Service: Cardiovascular;  Laterality: N/A;  . LEFT HEART CATHETERIZATION WITH  CORONARY/GRAFT ANGIOGRAM  10/22/2013   Procedure: LEFT HEART CATHETERIZATION WITH Beatrix Fetters;  Surgeon: Leonie Man, MD;  Location: [facility] CATH LAB;  Service: Cardiovascular;;  . PANCREATIC STENT PLACEMENT  03/25/2019   Procedure: PANCREATIC STENT PLACEMENT;  Surgeon: Rush Landmark Telford Nab., MD;  Location: [place];  Service: Gastroenterology;;  . PERCUTANEOUS CORONARY STENT INTERVENTION (PCI-S) N/A 03/07/2012   Procedure: PERCUTANEOUS CORONARY STENT INTERVENTION (PCI-S);  Surgeon: Sherren Mocha, MD;  Location: [facility] CATH LAB;  Service: Cardiovascular;  Laterality: N/A;  . PERCUTANEOUS CORONARY STENT INTERVENTION (PCI-S) Right 10/23/2013   Procedure: PERCUTANEOUS CORONARY STENT INTERVENTION (PCI-S);  Surgeon: Blane Ohara, MD;  Location: [facility] CATH LAB;  Service: Cardiovascular;  Laterality: Right;  . POLYPECTOMY  07/06/2018   Procedure: POLYPECTOMY;  Surgeon: Rogene Houston, MD;  [facility];  Service: Endoscopy;;  polyp at ampulla  . POLYPECTOMY  03/25/2019   Procedure: AMPULLECTOMY;  Surgeon: Mansouraty, Telford Nab., MD;  Location: [place];  Service: Gastroenterology;;  . Right hand surgery    . SPHINCTEROTOMY  03/25/2019   Procedure: SPHINCTEROTOMY;  Surgeon: Mansouraty, Telford Nab., MD;  Location: [place];  Service: Gastroenterology;;  . TIBIA IM NAIL INSERTION Right 04/07/2016   Procedure: FIXATION OF RIGHT TIBIA FRACTURE;  Surgeon: Garald Balding, MD;  Location: [place];  Service: Orthopedics;  Laterality: Right;    Family History  Problem Relation Age of Onset  . Coronary artery disease Other   . Colon cancer Neg Hx   . Esophageal cancer Neg Hx   . Inflammatory bowel disease Neg Hx   . Liver disease Neg Hx   . Pancreatic cancer Neg Hx   . Rectal cancer Neg Hx   . Stomach cancer Neg Hx     Social History   Socioeconomic History  . Marital status: Legally Separated    Spouse name: Not on file  . Number of children: Not on file  . Years of  education: Not on file  . Highest education level: Not on file  Occupational History  . Not on file  Tobacco Use  . Smoking status: Current Some Day Smoker    Packs/day: 1.00    Years: 54.00    Pack years: 54.00    Types: Cigarettes    Start date: 10/04/1964  . Smokeless tobacco: Never Used  Substance and Sexual Activity  . Alcohol use: Yes    Comment: Maybe 2 beers a month and 2 shotsa month  . Drug use: No  . Sexual activity: Yes  Other Topics Concern  . Not on file  Social History Narrative   Disabled.  Former truck Software engineer. Divorced and remarried x 2         Social Determinants of Health   Financial Resource Strain:   . Difficulty of Paying Living Expenses: Not on file  Food Insecurity:   . Worried About Charity fundraiser in the Last Year: Not on file  . Ran Out of Food in the Last Year: Not on file  Transportation Needs:   . Lack of Transportation (Medical): Not on file  .  Lack of Transportation (Non-Medical): Not on file  Physical Activity:   . Days of Exercise per Week: Not on file  . Minutes of Exercise per Session: Not on file  Stress:   . Feeling of Stress : Not on file  Social Connections:   . Frequency of Communication with Friends and Family: Not on file  . Frequency of Social Gatherings with Friends and Family: Not on file  . Attends Religious Services: Not on file  . Active Member of Clubs or Organizations: Not on file  . Attends Archivist Meetings: Not on file  . Marital Status: Not on file  Intimate Partner Violence:   . Fear of Current or Ex-Partner: Not on file  . Emotionally Abused: Not on file  . Physically Abused: Not on file  . Sexually Abused: Not on file     Physical Exam  Vital Signs and Nursing Notes reviewed Vitals:   06/01/19 0000 06/01/19 0055  BP: (!) 110/57 112/64  Pulse: 67 66  Resp: (!) 25 (!) 22  Temp:  98.3 F (36.8 C)  SpO2: 100% 94%    CONSTITUTIONAL: Chronically ill-appearing,  NAD NEURO:  Alert and oriented x 3, no focal deficits EYES:  eyes equal and reactive ENT/NECK:  no LAD, no JVD CARDIO: Regular rate, well-perfused, normal S1 and S2 PULM: Faint wheezes, poor air movement, mildly tachypneic with retractions GI/GU:  normal bowel sounds, non-distended, non-tender MSK/SPINE:  No gross deformities, no edema SKIN:  no rash, atraumatic PSYCH:  Appropriate speech and behavior  Diagnostic and Interventional Summary    EKG Interpretation  Date/Time:  Friday May 31 2019 23:07:16 EST Ventricular Rate:  74 PR Interval:    QRS Duration: 98 QT Interval:  394 QTC Calculation: 438 R Axis:   12 Text Interpretation: Sinus rhythm Low voltage, precordial leads No significant change was found Confirmed by Gerlene Fee [phone]) on 05/31/2019 11:17:10 PM      Labs Reviewed  COMPREHENSIVE METABOLIC PANEL - Abnormal; Notable for the following components:      Result Value   Chloride 95 (*)    Glucose, Bld 144 (*)    All other components within normal limits  CBC WITH DIFFERENTIAL/PLATELET - Abnormal; Notable for the following components:   RBC 3.53 (*)    Hemoglobin 11.2 (*)    HCT 35.9 (*)    MCV 101.7 (*)    Platelets 73 (*)    Monocytes Absolute 1.2 (*)    All other components within normal limits  BRAIN NATRIURETIC PEPTIDE - Abnormal; Notable for the following components:   B Natriuretic Peptide 248.0 (*)    All other components within normal limits  CULTURE, BLOOD (ROUTINE X 2)  CULTURE, BLOOD (ROUTINE X 2)  URINE CULTURE  SARS CORONAVIRUS 2 (TAT 6-24 HRS)  LACTIC ACID, PLASMA  APTT  PROTIME-INR  LACTIC ACID, PLASMA  URINALYSIS, ROUTINE W REFLEX MICROSCOPIC  POC SARS CORONAVIRUS 2 AG -  ED    DG Chest Port 1 View  Final Result      Medications  azithromycin (ZITHROMAX) 500 mg in sodium chloride 0.9 % 250 mL IVPB (500 mg Intravenous New Bag/Given 06/01/19 0015)  dexamethasone (DECADRON) injection 6 mg (6 mg Intravenous Given 05/31/19 2327)   albuterol (VENTOLIN HFA) 108 (90 Base) MCG/ACT inhaler 2 puff (2 puffs Inhalation Given 05/31/19 2329)  cefTRIAXone (ROCEPHIN) 2 g in sodium chloride 0.9 % 100 mL IVPB (0 g Intravenous Stopped 06/01/19 0012)     Procedures  /  Critical Care .Critical Care Performed by: Maudie Flakes, MD Authorized by: Maudie Flakes, MD   Critical care provider statement:    Critical care time (minutes):  36   Critical care was necessary to treat or prevent imminent or life-threatening deterioration of the following conditions:  Sepsis   Critical care was time spent personally by me on the following activities:  Discussions with consultants, evaluation of patient's response to treatment, examination of patient, ordering and performing treatments and interventions, ordering and review of laboratory studies, ordering and review of radiographic studies, pulse oximetry, re-evaluation of patient's condition, obtaining history from patient or surrogate and review of old charts    ED Course and Medical Decision Making  I have reviewed the triage vital signs and the nursing notes.  Pertinent labs & imaging results that were available during my care of the patient were reviewed by me and considered in my medical decision making (see below for details).     Concern for sepsis of pulmonary etiology given patient's hypoxia, report of fever, initially with hypotension on arrival but this is corrected to systolic of [id number], normal heart rate without intervention.  Code sepsis initiated, providing empiric CAP coverage.  Also concern for coronavirus.  Patient is without chest pain, EKG is without ischemic changes.  No evidence of DVT on exam.  Patient is with significant new oxygen requirement, 6 L nasal cannula, will need admission.  Admitted to hospital service for further care.  Rapid Covid test is negative, formal pending.  Barth Kirks. Sedonia Small, MD [place] [email or web address]  Final Clinical Impressions(s) / ED Diagnoses     ICD-10-CM   1. Multifocal pneumonia  J18.9   2. Sepsis, due to unspecified organism, unspecified whether acute organ dysfunction present ([place])  A41.9   3. Acute respiratory failure with hypoxia ([facility])  J96.01   4. Suspected COVID-19 virus infection  Z20.828     ED Discharge Orders    None       Discharge Instructions Discussed with and Provided to Patient:   Discharge Instructions   None       Maudie Flakes, MD 06/01/19 [phone]

## 2019-06-01 ENCOUNTER — Encounter (HOSPITAL_COMMUNITY): Payer: Self-pay | Admitting: Internal Medicine

## 2019-06-01 DIAGNOSIS — R0602 Shortness of breath: Secondary | ICD-10-CM | POA: Diagnosis present

## 2019-06-01 DIAGNOSIS — I252 Old myocardial infarction: Secondary | ICD-10-CM | POA: Diagnosis not present

## 2019-06-01 DIAGNOSIS — I5032 Chronic diastolic (congestive) heart failure: Secondary | ICD-10-CM | POA: Diagnosis present

## 2019-06-01 DIAGNOSIS — J9601 Acute respiratory failure with hypoxia: Secondary | ICD-10-CM | POA: Diagnosis present

## 2019-06-01 DIAGNOSIS — F17210 Nicotine dependence, cigarettes, uncomplicated: Secondary | ICD-10-CM | POA: Diagnosis present

## 2019-06-01 DIAGNOSIS — Z20828 Contact with and (suspected) exposure to other viral communicable diseases: Secondary | ICD-10-CM | POA: Diagnosis present

## 2019-06-01 DIAGNOSIS — I2510 Atherosclerotic heart disease of native coronary artery without angina pectoris: Secondary | ICD-10-CM | POA: Diagnosis present

## 2019-06-01 DIAGNOSIS — I110 Hypertensive heart disease with heart failure: Secondary | ICD-10-CM | POA: Diagnosis present

## 2019-06-01 DIAGNOSIS — J189 Pneumonia, unspecified organism: Secondary | ICD-10-CM | POA: Diagnosis present

## 2019-06-01 DIAGNOSIS — I25810 Atherosclerosis of coronary artery bypass graft(s) without angina pectoris: Secondary | ICD-10-CM | POA: Diagnosis present

## 2019-06-01 DIAGNOSIS — E785 Hyperlipidemia, unspecified: Secondary | ICD-10-CM | POA: Diagnosis present

## 2019-06-01 DIAGNOSIS — Z23 Encounter for immunization: Secondary | ICD-10-CM | POA: Diagnosis present

## 2019-06-01 DIAGNOSIS — N179 Acute kidney failure, unspecified: Secondary | ICD-10-CM | POA: Diagnosis present

## 2019-06-01 DIAGNOSIS — Z955 Presence of coronary angioplasty implant and graft: Secondary | ICD-10-CM | POA: Diagnosis not present

## 2019-06-01 DIAGNOSIS — K219 Gastro-esophageal reflux disease without esophagitis: Secondary | ICD-10-CM | POA: Diagnosis present

## 2019-06-01 DIAGNOSIS — J440 Chronic obstructive pulmonary disease with acute lower respiratory infection: Secondary | ICD-10-CM | POA: Diagnosis present

## 2019-06-01 LAB — URINALYSIS, ROUTINE W REFLEX MICROSCOPIC
Bacteria, UA: NONE SEEN
Glucose, UA: NEGATIVE mg/dL
Hgb urine dipstick: NEGATIVE
Ketones, ur: NEGATIVE mg/dL
Leukocytes,Ua: NEGATIVE
Nitrite: NEGATIVE
Protein, ur: 30 mg/dL — AB
Specific Gravity, Urine: 1.024 (ref 1.005–1.030)
pH: 5 (ref 5.0–8.0)

## 2019-06-01 LAB — CBC WITH DIFFERENTIAL/PLATELET
Abs Immature Granulocytes: 0.05 10*3/uL (ref 0.00–0.07)
Basophils Absolute: 0 10*3/uL (ref 0.0–0.1)
Basophils Relative: 0 %
Eosinophils Absolute: 0 10*3/uL (ref 0.0–0.5)
Eosinophils Relative: 0 %
HCT: 35.9 % — ABNORMAL LOW (ref 39.0–52.0)
Hemoglobin: 11.2 g/dL — ABNORMAL LOW (ref 13.0–17.0)
Immature Granulocytes: 1 %
Lymphocytes Relative: 25 %
Lymphs Abs: 2.4 10*3/uL (ref 0.7–4.0)
MCH: 31.7 pg (ref 26.0–34.0)
MCHC: 31.2 g/dL (ref 30.0–36.0)
MCV: 101.7 fL — ABNORMAL HIGH (ref 80.0–100.0)
Monocytes Absolute: 1.2 10*3/uL — ABNORMAL HIGH (ref 0.1–1.0)
Monocytes Relative: 12 %
Neutro Abs: 6 10*3/uL (ref 1.7–7.7)
Neutrophils Relative %: 62 %
Platelets: 73 10*3/uL — ABNORMAL LOW (ref 150–400)
RBC: 3.53 MIL/uL — ABNORMAL LOW (ref 4.22–5.81)
RDW: 12.9 % (ref 11.5–15.5)
WBC: 9.6 10*3/uL (ref 4.0–10.5)
nRBC: 0 % (ref 0.0–0.2)

## 2019-06-01 LAB — CBC
HCT: 44.4 % (ref 39.0–52.0)
Hemoglobin: 13.8 g/dL (ref 13.0–17.0)
MCH: 31.7 pg (ref 26.0–34.0)
MCHC: 31.1 g/dL (ref 30.0–36.0)
MCV: 101.8 fL — ABNORMAL HIGH (ref 80.0–100.0)
Platelets: 224 10*3/uL (ref 150–400)
RBC: 4.36 MIL/uL (ref 4.22–5.81)
RDW: 12.9 % (ref 11.5–15.5)
WBC: 12.7 10*3/uL — ABNORMAL HIGH (ref 4.0–10.5)
nRBC: 0 % (ref 0.0–0.2)

## 2019-06-01 LAB — COMPREHENSIVE METABOLIC PANEL
ALT: 15 U/L (ref 0–44)
AST: 17 U/L (ref 15–41)
Albumin: 3.4 g/dL — ABNORMAL LOW (ref 3.5–5.0)
Alkaline Phosphatase: 59 U/L (ref 38–126)
Anion gap: 11 (ref 5–15)
BUN: 25 mg/dL — ABNORMAL HIGH (ref 8–23)
CO2: 31 mmol/L (ref 22–32)
Calcium: 8.8 mg/dL — ABNORMAL LOW (ref 8.9–10.3)
Chloride: 95 mmol/L — ABNORMAL LOW (ref 98–111)
Creatinine, Ser: 1.46 mg/dL — ABNORMAL HIGH (ref 0.61–1.24)
GFR calc Af Amer: 57 mL/min — ABNORMAL LOW (ref >60)
GFR calc non Af Amer: 49 mL/min — ABNORMAL LOW (ref >60)
Glucose, Bld: 261 mg/dL — ABNORMAL HIGH (ref 70–99)
Potassium: 4.5 mmol/L (ref 3.5–5.1)
Sodium: 137 mmol/L (ref 135–145)
Total Bilirubin: 0.3 mg/dL (ref 0.3–1.2)
Total Protein: 7.2 g/dL (ref 6.5–8.1)

## 2019-06-01 LAB — PROCALCITONIN: Procalcitonin: <0.1 ng/mL

## 2019-06-01 LAB — SARS CORONAVIRUS 2 BY RT PCR (HOSPITAL ORDER, PERFORMED IN ~~LOC~~ HOSPITAL LAB): SARS Coronavirus 2: NEGATIVE

## 2019-06-01 LAB — POC SARS CORONAVIRUS 2 AG -  ED: SARS Coronavirus 2 Ag: NEGATIVE

## 2019-06-01 LAB — HIV ANTIBODY (ROUTINE TESTING W REFLEX): HIV Screen 4th Generation wRfx: NONREACTIVE

## 2019-06-01 MED ORDER — AMLODIPINE BESYLATE 5 MG PO TABS: 5.0000 mg | ORAL_TABLET | Freq: Every day | ORAL | Status: DC

## 2019-06-01 MED ORDER — ONDANSETRON HCL 4 MG PO TABS: 4.0000 mg | ORAL_TABLET | Freq: Four times a day (QID) | ORAL | Status: DC | PRN

## 2019-06-01 MED ORDER — RANOLAZINE ER 500 MG PO TB12
1000.0000 mg | ORAL_TABLET | Freq: Two times a day (BID) | ORAL | Status: DC
  Administered 2019-06-01 – 2019-06-04 (×7): 1000 mg via ORAL
  Filled 2019-06-01 (×8): qty 2

## 2019-06-01 MED ORDER — FUROSEMIDE 40 MG PO TABS
40.0000 mg | ORAL_TABLET | Freq: Every day | ORAL | Status: DC
  Administered 2019-06-01 – 2019-06-02 (×2): 40 mg via ORAL
  Filled 2019-06-01 (×2): qty 1

## 2019-06-01 MED ORDER — CARVEDILOL 3.125 MG PO TABS
6.2500 mg | ORAL_TABLET | Freq: Two times a day (BID) | ORAL | Status: DC
  Administered 2019-06-01 – 2019-06-04 (×5): 6.25 mg via ORAL
  Filled 2019-06-01 (×6): qty 2

## 2019-06-01 MED ORDER — ONDANSETRON HCL 4 MG/2ML IJ SOLN: 4.0000 mg | Freq: Four times a day (QID) | INTRAMUSCULAR | Status: DC | PRN

## 2019-06-01 MED ORDER — ACETAMINOPHEN 650 MG RE SUPP: 650.0000 mg | Freq: Four times a day (QID) | RECTAL | Status: DC | PRN

## 2019-06-01 MED ORDER — IPRATROPIUM-ALBUTEROL 0.5-2.5 (3) MG/3ML IN SOLN
3.0000 mL | Freq: Four times a day (QID) | RESPIRATORY_TRACT | Status: DC
  Administered 2019-06-01 – 2019-06-04 (×11): 3 mL via RESPIRATORY_TRACT
  Filled 2019-06-01 (×13): qty 3

## 2019-06-01 MED ORDER — ENOXAPARIN SODIUM 40 MG/0.4ML ~~LOC~~ SOLN
40.0000 mg | SUBCUTANEOUS | Status: DC
  Administered 2019-06-01 – 2019-06-04 (×4): 40 mg via SUBCUTANEOUS
  Filled 2019-06-01 (×4): qty 0.4

## 2019-06-01 MED ORDER — ROSUVASTATIN CALCIUM 20 MG PO TABS
40.0000 mg | ORAL_TABLET | Freq: Every day | ORAL | Status: DC
  Administered 2019-06-01 – 2019-06-04 (×4): 40 mg via ORAL
  Filled 2019-06-01: qty 1
  Filled 2019-06-01: qty 4
  Filled 2019-06-01: qty 1
  Filled 2019-06-01: qty 2
  Filled 2019-06-01: qty 1
  Filled 2019-06-01 (×2): qty 2

## 2019-06-01 MED ORDER — PANTOPRAZOLE SODIUM 40 MG PO TBEC
40.0000 mg | DELAYED_RELEASE_TABLET | Freq: Two times a day (BID) | ORAL | Status: DC
  Administered 2019-06-01 – 2019-06-04 (×7): 40 mg via ORAL
  Filled 2019-06-01 (×7): qty 1

## 2019-06-01 MED ORDER — SODIUM CHLORIDE 0.9 % IV SOLN
INTRAVENOUS | Status: DC
  Administered 2019-06-01: 05:00:00 via INTRAVENOUS

## 2019-06-01 MED ORDER — LISINOPRIL 10 MG PO TABS
20.0000 mg | ORAL_TABLET | Freq: Every day | ORAL | Status: DC
  Administered 2019-06-01 – 2019-06-02 (×2): 20 mg via ORAL
  Filled 2019-06-01 (×2): qty 2

## 2019-06-01 MED ORDER — DM-GUAIFENESIN ER 30-600 MG PO TB12
1.0000 | ORAL_TABLET | Freq: Two times a day (BID) | ORAL | Status: DC
  Administered 2019-06-01 – 2019-06-04 (×7): 1 via ORAL
  Filled 2019-06-01 (×7): qty 1

## 2019-06-01 MED ORDER — BUDESONIDE 0.5 MG/2ML IN SUSP
0.5000 mg | Freq: Two times a day (BID) | RESPIRATORY_TRACT | Status: DC
  Administered 2019-06-01 – 2019-06-04 (×6): 0.5 mg via RESPIRATORY_TRACT
  Filled 2019-06-01 (×7): qty 2

## 2019-06-01 MED ORDER — ASPIRIN EC 81 MG PO TBEC
81.0000 mg | DELAYED_RELEASE_TABLET | Freq: Every day | ORAL | Status: DC
  Administered 2019-06-01 – 2019-06-04 (×4): 81 mg via ORAL
  Filled 2019-06-01 (×7): qty 1

## 2019-06-01 MED ORDER — ACETAMINOPHEN 325 MG PO TABS: 650.0000 mg | ORAL_TABLET | Freq: Four times a day (QID) | ORAL | Status: DC | PRN

## 2019-06-01 MED ORDER — SODIUM CHLORIDE 0.9 % IV SOLN
1.0000 g | INTRAVENOUS | Status: DC
  Administered 2019-06-01 – 2019-06-03 (×3): 1 g via INTRAVENOUS
  Filled 2019-06-01 (×3): qty 10

## 2019-06-01 NOTE — ED Notes (Signed)
Pt given meal tray and drink.

## 2019-06-01 NOTE — H&P (Signed)
                                                                            TRH H&P    Patient Demographics:    Clifford Jones, is a 68 y.o. male  MRN: [id number]  DOB - 05/25/1951  Admit Date - 05/31/2019  Referring MD/NP/PA: Michael Bero  Outpatient Primary MD for the patient is Boyd, William S, PA  Patient coming from: Home  Chief complaint-shortness of breath   HPI:    Clifford Jones  is a 68 y.o. male,*with history of CAD s/p CABG in 2003, hypertension, hyperlipidemia, ampullary adenoma status post ampullectomy came to ED with chief complaints of fever, cough and shortness of breath for past 4 days.  Patient denies chest pain.  He has been coughing up yellow phlegm.  Denies dysuria or abdominal pain.  No weakness or numbness of upper or lower extremities. In the ED chest x-ray showed scattered pulmonary opacities bilaterally greatest in the right upper and right midlung zone.  Findings concerning for multifocal pneumonia.  Initial POC COVID-19 test is negative, SARS-CoV-2 RT-PCR is currently pending. Patient is requiring 6 L/min of oxygen. He is not on oxygen at home. Patient started on ceftriaxone and Zithromax.    Review of systems:    In addition to the HPI above,   All other systems reviewed and are negative.    Past History of the following :    Past Medical History:  Diagnosis Date  . Ampullary adenoma   . Claustrophobia   . Closed fracture of right distal tibia   . Coronary artery disease    a. 1999 Inf MI;  b. 2003 CABGx6;  c. 2008 NSTEMI, occluded SVG to marginal and diagonal system. 2 DES placed to marginal system;  c. NSTEMI - 02/2012 Xience Xpedition DES to oS-OM and POBA to mid lesion;  c. 10/2013 NSTEMI/PCI: VG->OM 80 ISR, native OM 80/90 small, LIMA->LAD ok w/ 90 dLAD, D1 80, RIMA->RPL ok w/ 95 in RPL (2.5x14 Resolute DES), nl EF.  . GERD (gastroesophageal reflux disease)   . Hyperlipidemia   . Hypertension   . Myocardial infarction (HCC)    X 6  . S/P  AAA repair   . Tobacco abuse   . Wears dentures   . Wears glasses       Past Surgical History:  Procedure Laterality Date  . ABDOMINAL AORTIC ANEURYSM REPAIR  2006  . APPENDECTOMY    . CORONARY ANGIOPLASTY WITH STENT PLACEMENT    . CORONARY ARTERY BYPASS GRAFT  2003  . ENDOSCOPIC RETROGRADE CHOLANGIOPANCREATOGRAPHY (ERCP) WITH PROPOFOL N/A 03/25/2019   Procedure: ENDOSCOPIC RETROGRADE CHOLANGIOPANCREATOGRAPHY (ERCP) WITH PROPOFOL;  Surgeon: Mansouraty, Gabriel Jr., MD;  [facility];  Service: Gastroenterology;  Laterality: N/A;  . ESOPHAGEAL DILATION  07/06/2018   Procedure: ESOPHAGEAL DILATION;  Surgeon: Rehman, Najeeb U, MD;  [facility];  Service: Endoscopy;;  . ESOPHAGOGASTRODUODENOSCOPY N/A 07/06/2018   Procedure: ESOPHAGOGASTRODUODENOSCOPY (EGD);  Surgeon: Rehman, Najeeb U, MD;  [facility];  Service: Endoscopy;  Laterality: N/A;  . ESOPHAGOGASTRODUODENOSCOPY N/A 08/16/2018   Procedure: ESOPHAGOGASTRODUODENOSCOPY (EGD);  Surgeon: Jacobs, Daniel P, MD;  Location: [facility];  Service: Endoscopy;    Laterality: N/A;  . ESOPHAGOGASTRODUODENOSCOPY (EGD) WITH PROPOFOL N/A 03/25/2019   Procedure: ESOPHAGOGASTRODUODENOSCOPY (EGD) WITH PROPOFOL;  Surgeon: Mansouraty, Gabriel Jr., MD;  [facility];  Service: Gastroenterology;  Laterality: N/A;  . EUS N/A 08/16/2018   Procedure: UPPER ENDOSCOPIC ULTRASOUND (EUS) RADIAL;  Surgeon: Jacobs, Daniel P, MD;  Location: [facility];  Service: Endoscopy;  Laterality: N/A;  . EUS N/A 03/25/2019   Procedure: UPPER ENDOSCOPIC ULTRASOUND (EUS) RADIAL;  Surgeon: Mansouraty, Gabriel Jr., MD;  [facility];  Service: Gastroenterology;  Laterality: N/A;  . FOREIGN BODY REMOVAL  07/06/2018   Procedure: FOREIGN BODY REMOVAL;  Surgeon: Rehman, Najeeb U, MD;  [facility];  Service: Endoscopy;;  . HERNIA REPAIR     umbicical  . LEFT HEART CATHETERIZATION WITH CORONARY ANGIOGRAM N/A 03/05/2012    Procedure: LEFT HEART CATHETERIZATION WITH CORONARY ANGIOGRAM;  Surgeon: Thomas D Stuckey, MD;  [facility];  Service: Cardiovascular;  Laterality: N/A;  . LEFT HEART CATHETERIZATION WITH CORONARY ANGIOGRAM N/A 10/23/2013   Procedure: LEFT HEART CATHETERIZATION WITH CORONARY ANGIOGRAM;  Surgeon: Michael D Cooper, MD;  [facility];  Service: Cardiovascular;  Laterality: N/A;  . LEFT HEART CATHETERIZATION WITH CORONARY/GRAFT ANGIOGRAM  10/22/2013   Procedure: LEFT HEART CATHETERIZATION WITH CORONARY/GRAFT ANGIOGRAM;  Surgeon: David W Harding, MD;  [facility];  Service: Cardiovascular;;  . PANCREATIC STENT PLACEMENT  03/25/2019   Procedure: PANCREATIC STENT PLACEMENT;  Surgeon: Mansouraty, Gabriel Jr., MD;  [facility];  Service: Gastroenterology;;  . PERCUTANEOUS CORONARY STENT INTERVENTION (PCI-S) N/A 03/07/2012   Procedure: PERCUTANEOUS CORONARY STENT INTERVENTION (PCI-S);  Surgeon: Michael Cooper, MD;  [facility];  Service: Cardiovascular;  Laterality: N/A;  . PERCUTANEOUS CORONARY STENT INTERVENTION (PCI-S) Right 10/23/2013   Procedure: PERCUTANEOUS CORONARY STENT INTERVENTION (PCI-S);  Surgeon: Michael D Cooper, MD;  [facility];  Service: Cardiovascular;  Laterality: Right;  . POLYPECTOMY  07/06/2018   Procedure: POLYPECTOMY;  Surgeon: Rehman, Najeeb U, MD;  [facility];  Service: Endoscopy;;  polyp at ampulla  . POLYPECTOMY  03/25/2019   Procedure: AMPULLECTOMY;  Surgeon: Mansouraty, Gabriel Jr., MD;  [facility];  Service: Gastroenterology;;  . Right hand surgery    . SPHINCTEROTOMY  03/25/2019   Procedure: SPHINCTEROTOMY;  Surgeon: Mansouraty, Gabriel Jr., MD;  [facility];  Service: Gastroenterology;;  . TIBIA IM NAIL INSERTION Right 04/07/2016   Procedure: FIXATION OF RIGHT TIBIA FRACTURE;  Surgeon: Peter W Whitfield, MD;  [facility];  Service: Orthopedics;  Laterality: Right;      Social  History:      Social History   Tobacco Use  . Smoking status: Current Some Day Smoker    Packs/day: 1.00    Years: 54.00    Pack years: 54.00    Types: Cigarettes    Start date: 10/04/1964  . Smokeless tobacco: Never Used  Substance Use Topics  . Alcohol use: Yes    Comment: Maybe 2 beers a month and 2 shotsa month       Family History :     Family History  Problem Relation Age of Onset  . Coronary artery disease Other   . Colon cancer Neg Hx   . Esophageal cancer Neg Hx   . Inflammatory bowel disease Neg Hx   . Liver disease Neg Hx   . Pancreatic cancer Neg Hx   . Rectal cancer Neg Hx   . Stomach cancer Neg Hx         Home Medications:   Prior to Admission medications   Medication Sig Start Date End Date Taking? Authorizing Provider  acetaminophen (TYLENOL) 500 MG tablet Take 1,000 mg by mouth every 6 (six) hours as needed for moderate pain or headache.    [provider]  amLODipine (NORVASC) 5 MG tablet TAKE ONE TABLET BY MOUTH EVERY DAY Patient taking differently: Take 5 mg by mouth daily.  03/17/17   Herminio Commons, MD  aspirin 81 MG EC tablet Take 1 tablet (81 mg total) by mouth daily. 07/09/18   Rehman, Mechele Dawley, MD  carvedilol (COREG) 6.25 MG tablet TAKE ONE TABLET BY MOUTH TWICE DAILY Patient taking differently: Take 6.25 mg by mouth 2 (two) times daily.  03/17/17   Herminio Commons, MD  furosemide (LASIX) 40 MG tablet Take 1 tablet (40 mg total) by mouth daily. 01/29/19   Arnoldo Lenis, MD  ipratropium-albuterol (DUONEB) 0.5-2.5 (3) MG/3ML SOLN Inhale 3 mLs into the lungs every 4 (four) hours as needed for shortness of breath. 02/01/19   [provider]  lisinopril (PRINIVIL,ZESTRIL) 20 MG tablet TAKE ONE TABLET BY MOUTH EVERY DAY Patient taking differently: Take 20 mg by mouth daily.  03/17/17   Herminio Commons, MD  nitroGLYCERIN (NITROSTAT) 0.4 MG SL tablet Place 1 tablet (0.4 mg total) under the tongue every 5 (five) minutes  x 3 doses as needed for chest pain. 03/03/15   Isaiah Serge, NP  pantoprazole (PROTONIX) 40 MG tablet TAKE 1 TABLET BY MOUTH TWICE DAILY Patient taking differently: Take 40 mg by mouth 2 (two) times daily.  06/14/18   Herminio Commons, MD  RANEXA 1000 MG SR tablet TAKE ONE TABLET BY MOUTH TWICE DAILY Patient taking differently: Take 1,000 mg by mouth 2 (two) times daily with a meal.  03/17/17   Herminio Commons, MD  rosuvastatin (CRESTOR) 40 MG tablet TAKE ONE TABLET BY MOUTH EVERY DAY Patient taking differently: Take 40 mg by mouth daily.  03/17/17   Herminio Commons, MD     Allergies:    No Known Allergies   Physical Exam:   Vitals  Blood pressure 125/66, pulse 69, temperature 98.3 F (36.8 C), temperature source Oral, resp. rate (!) 24, height 6' (1.829 m), weight 104.3 kg, SpO2 92 %.  1.  General: Appears in no acute distress  2. Psychiatric: Alert, oriented x3, intact insight and judgment  3. Neurologic: Cranial nerves II through XII grossly intact, no focal deficit noted  4. HEENMT:  Atraumatic normocephalic, extraocular muscles are intact  5. Respiratory : Bilateral rhonchi auscultated  6. Cardiovascular : S1-S2, regular, no murmur auscultated, no edema in the lower extremities  7. Gastrointestinal:  Abdomen is soft, nontender, no organomegaly      Data Review:    CBC Recent Labs  Lab 05/31/19 2318  WBC 9.6  HGB 11.2*  HCT 35.9*  PLT 73*  MCV 101.7*  MCH 31.7  MCHC 31.2  RDW 12.9  LYMPHSABS 2.4  MONOABS 1.2*  EOSABS 0.0  BASOSABS 0.0   ------------------------------------------------------------------------------------------------------------------  Results for orders placed or performed during the hospital encounter of 05/31/19 (from the past 48 hour(s))  Lactic acid, plasma     Status: None   Collection Time: 05/31/19 11:18 PM  Result Value Ref Range   Lactic Acid, Venous 0.8 0.5 - 1.9 mmol/L    Comment: Performed at [facility], [address], [place], [place] [address]  Comprehensive metabolic panel     Status:  Abnormal   Collection Time: 05/31/19 11:18 PM  Result Value Ref Range   Sodium 139 135 - 145 mmol/L   Potassium 3.8 3.5 - 5.1 mmol/L   Chloride 95 (L) 98 - 111 mmol/L   CO2 32 22 - 32 mmol/L   Glucose, Bld 144 (H) 70 - 99 mg/dL   BUN 23 8 - 23 mg/dL   Creatinine, Ser 1.24 0.61 - 1.24 mg/dL   Calcium 9.0 8.9 - 10.3 mg/dL   Total Protein 7.6 6.5 - 8.1 g/dL   Albumin 3.7 3.5 - 5.0 g/dL   AST 16 15 - 41 U/L   ALT 16 0 - 44 U/L   Alkaline Phosphatase 62 38 - 126 U/L   Total Bilirubin 0.5 0.3 - 1.2 mg/dL   GFR calc non Af Amer >60 >60 mL/min   GFR calc Af Amer >60 >60 mL/min   Anion gap 12 5 - 15    Comment: Performed at [facility], [address] [place], [place] [address]  CBC WITH DIFFERENTIAL     Status: Abnormal   Collection Time: 05/31/19 11:18 PM  Result Value Ref Range   WBC 9.6 4.0 - 10.5 K/uL   RBC 3.53 (L) 4.22 - 5.81 MIL/uL   Hemoglobin 11.2 (L) 13.0 - 17.0 g/dL   HCT 35.9 (L) 39.0 - 52.0 %   MCV 101.7 (H) 80.0 - 100.0 fL   MCH 31.7 26.0 - 34.0 pg   MCHC 31.2 30.0 - 36.0 g/dL   RDW 12.9 11.5 - 15.5 %   Platelets 73 (L) 150 - 400 K/uL    Comment: PLATELET COUNT CONFIRMED BY SMEAR SPECIMEN CHECKED FOR CLOTS    nRBC 0.0 0.0 - 0.2 %   Neutrophils Relative % 62 %   Neutro Abs 6.0 1.7 - 7.7 K/uL   Lymphocytes Relative 25 %   Lymphs Abs 2.4 0.7 - 4.0 K/uL   Monocytes Relative 12 %   Monocytes Absolute 1.2 (H) 0.1 - 1.0 K/uL   Eosinophils Relative 0 %   Eosinophils Absolute 0.0 0.0 - 0.5 K/uL   Basophils Relative 0 %   Basophils Absolute 0.0 0.0 - 0.1 K/uL   Immature Granulocytes 1 %   Abs Immature Granulocytes 0.05 0.00 - 0.07 K/uL    Comment: Performed at [facility], [address] [place], [place] [address]  APTT     Status: None   Collection Time: 05/31/19 11:18 PM  Result Value Ref Range   aPTT 24 24 - 36 seconds    Comment: Performed at [facility], [address] [place], [place] [address]  Protime-INR     Status: None   Collection Time: 05/31/19 11:18 PM  Result Value Ref Range   Prothrombin Time 13.2 11.4 - 15.2 seconds   INR 1.0 0.8 - 1.2    Comment: (NOTE) INR goal varies based on device and disease states. Performed at [facility], [address] [place], [place] [address]   BNP     Status: Abnormal   Collection Time: 05/31/19 11:18 PM  Result Value Ref Range   B Natriuretic Peptide 248.0 (H) 0.0 - 100.0 pg/mL    Comment: Performed at [facility], [address] [place],  [address]  Urinalysis, Routine w reflex microscopic     Status: Abnormal   Collection Time: 06/01/19 12:58 AM  Result Value Ref Range   Color, Urine AMBER (A) YELLOW    Comment: BIOCHEMICALS MAY BE AFFECTED BY COLOR   APPearance CLEAR   CLEAR   Specific Gravity, Urine 1.024 1.005 - 1.030   pH 5.0 5.0 - 8.0   Glucose, UA NEGATIVE NEGATIVE mg/dL   Hgb urine dipstick NEGATIVE NEGATIVE   Bilirubin Urine SMALL (A) NEGATIVE   Ketones, ur NEGATIVE NEGATIVE mg/dL   Protein, ur 30 (A) NEGATIVE mg/dL   Nitrite NEGATIVE NEGATIVE   Leukocytes,Ua NEGATIVE NEGATIVE   RBC / HPF 0-5 0 - 5 RBC/hpf   WBC, UA 11-20 0 - 5 WBC/hpf   Bacteria, UA NONE SEEN NONE SEEN   Squamous Epithelial / LPF 0-5 0 - 5   Mucus PRESENT    Hyaline Casts, UA PRESENT     Comment: Performed at [facility], [address], [place], [place] [address]  POC SARS Coronavirus 2 Ag-ED - Nasal Swab (BD Veritor Kit)     Status: None   Collection Time: 06/01/19 12:59 AM  Result Value Ref Range   SARS Coronavirus 2 Ag NEGATIVE NEGATIVE    Comment: (NOTE) SARS-CoV-2 antigen NOT DETECTED.  Negative results are presumptive.  Negative results do not preclude SARS-CoV-2 infection and should not be used as the sole basis for treatment or other patient management decisions, including infection  control decisions, particularly in the presence of clinical signs and  symptoms consistent with COVID-19, or in  those who have been in contact with the virus.  Negative results must be combined with clinical observations, patient history, and epidemiological information. The expected result is Negative. Fact Sheet for Patients: PodPark.tn Fact Sheet for Healthcare Providers: GiftContent.is This test is not yet approved or cleared by the [place] FDA and  has been authorized for detection and/or diagnosis of SARS-CoV-2 by FDA under an Emergency Use Authorization (EUA).  This EUA will remain in effect (meaning this test can be used) for the duration of  the COVID-19 de claration under Section 564(b)(1) of the Act, 21 U.S.C. section 360bbb-3(b)(1), unless the authorization is terminated or revoked sooner.     Chemistries  Recent Labs  Lab 05/31/19 2318  NA 139  K 3.8  CL 95*  CO2 32  GLUCOSE 144*  BUN 23  CREATININE 1.24  CALCIUM 9.0  AST 16  ALT 16  ALKPHOS 62  BILITOT 0.5   ------------------------------------------------------------------------------------------------------------------  ------------------------------------------------------------------------------------------------------------------ GFR: Estimated Creatinine Clearance: 73.2 mL/min (by C-G formula based on SCr of 1.24 mg/dL). Liver Function Tests: Recent Labs  Lab 05/31/19 2318  AST 16  ALT 16  ALKPHOS 62  BILITOT 0.5  PROT 7.6  ALBUMIN 3.7   No results for input(s): LIPASE, AMYLASE in the last 168 hours. No results for input(s): AMMONIA in the last 168 hours. Coagulation Profile: Recent Labs  Lab 05/31/19 2318  INR 1.0    --------------------------------------------------------------------------------------------------------------- Urine analysis:    Component Value Date/Time   COLORURINE AMBER (A) 06/01/2019 0058   APPEARANCEUR CLEAR 06/01/2019 0058   LABSPEC 1.024 06/01/2019 0058   PHURINE 5.0 06/01/2019 0058   GLUCOSEU NEGATIVE  06/01/2019 0058   HGBUR NEGATIVE 06/01/2019 0058   BILIRUBINUR SMALL (A) 06/01/2019 0058   KETONESUR NEGATIVE 06/01/2019 0058   PROTEINUR 30 (A) 06/01/2019 0058   NITRITE NEGATIVE 06/01/2019 0058   LEUKOCYTESUR NEGATIVE 06/01/2019 0058      Imaging Results:    DG Chest Port 1 View  Result Date: 05/31/2019 CLINICAL DATA:  Sepsis EXAM: PORTABLE CHEST 1 VIEW COMPARISON:  04/07/2016 FINDINGS: The heart size is significantly enlarged. The patient is status post prior median sternotomy. Scattered pulmonary opacities are noted bilaterally but greatest  in the right upper and right mid lung zones. The lungs are hyperexpanded. There is no significant pleural effusion. There is no pneumothorax. IMPRESSION: 1. Scattered pulmonary opacities bilaterally, greatest in the right upper and right mid lung zones. Findings are concerning for multifocal pneumonia (viral or bacterial). 2. Cardiomegaly. Electronically Signed   By: Christopher  Green M.D.   On: 05/31/2019 23:43    My personal review of EKG: Rhythm NSR, no ST-T changes   Assessment & Plan:    Active Problems:   CAP (community acquired pneumonia)   1. Acute hypoxic respiratory failure-likely from underlying multifocal pneumonia.  POC Covid 19 test is negative, SARS-CoV-2 RT-PCR is obtained and is currently pending.  Patient started on ceftriaxone and Zithromax for multifocal pneumonia.  Will obtain urinary strep pneumo antigen.  Follow blood culture results.  Patient is requiring 6 L/min of oxygen.  2. Hypertension-continue lisinopril, amlodipine.  3. CAD s/p CABG-continue aspirin, Coreg, Ranexa.  4. Chronic diastolic CHF-continue Lasix 40 mg p.o. daily.    DVT Prophylaxis-   Lovenox   AM Labs Ordered, also please review Full Orders  Family Communication: Admission, patients condition and plan of care including tests being ordered have been discussed with the patient  who indicate understanding and agree with the plan and Code  Status.  Code Status: Full code  Admission status: Inpatient: Based on patients clinical presentation and evaluation of above clinical data, I have made determination that patient meets Inpatient criteria at this time.  Time spent in minutes : 60 minutes   Gagan S Lama M.D           

## 2019-06-01 NOTE — ED Notes (Signed)
ED TO INPATIENT HANDOFF REPORT  ED Nurse Name and Phone #:   S Name/Age/Gender Clifford Jones 68 y.o. male Room/Bed: APA10/APA10  Code Status   Code Status: Full Code  Home/SNF/Other Home Patient oriented to: self, place, time and situation Is this baseline? Yes   Triage Complete: Triage complete  Chief Complaint CAP (community acquired pneumonia) [J18.9]  Triage Note Pt arrives from home c/o of fever reporting temp of 101 F at home. Pt presents with SOB and reports O2 Sats around 85% at home "this is normal for me". During triage Pt presents O2 Sats at 80% Room air, and Pt has productive cough with thick green sputum.    Allergies No Known Allergies  Level of Care/Admitting Diagnosis ED Disposition    ED Disposition Condition [facility] Area: [facility] [[id number]]  Level of Care: Med-Surg [16]  Covid Evaluation: Symptomatic Person Under Investigation (PUI)  Diagnosis: CAP (community acquired pneumonia) [948546]  Admitting Physician: Oswald Hillock [[id number]]  Attending Physician: Oswald Hillock [[id number]]  Estimated length of stay: 3 - 4 days  Certification:: I certify this patient will need inpatient services for at least 2 midnights       B Medical/Surgery History Past Medical History:  Diagnosis Date  . Ampullary adenoma   . Claustrophobia   . Closed fracture of right distal tibia   . Coronary artery disease    a. 1999 Inf MI;  b. 2003 CABGx6;  c. 2008 NSTEMI, occluded SVG to marginal and diagonal system. 2 DES placed to marginal system;  c. NSTEMI - 02/2012 Xience Xpedition DES to oS-OM and POBA to mid lesion;  c. 10/2013 NSTEMI/PCI: VG->OM 80 ISR, native OM 80/90 small, LIMA->LAD ok w/ 90 dLAD, D1 80, RIMA->RPL ok w/ 95 in RPL (2.5x14 Resolute DES), nl EF.  Marland Kitchen GERD (gastroesophageal reflux disease)   . Hyperlipidemia   . Hypertension   . Myocardial infarction ([place])    X 6  . S/P AAA repair   . Tobacco abuse   . Wears dentures   . Wears  glasses    Past Surgical History:  Procedure Laterality Date  . ABDOMINAL AORTIC ANEURYSM REPAIR  2006  . APPENDECTOMY    . CORONARY ANGIOPLASTY WITH STENT PLACEMENT    . CORONARY ARTERY BYPASS GRAFT  2003  . ENDOSCOPIC RETROGRADE CHOLANGIOPANCREATOGRAPHY (ERCP) WITH PROPOFOL N/A 03/25/2019   Procedure: ENDOSCOPIC RETROGRADE CHOLANGIOPANCREATOGRAPHY (ERCP) WITH PROPOFOL;  Surgeon: Rush Landmark Telford Nab., MD;  Location: [place];  Service: Gastroenterology;  Laterality: N/A;  . ESOPHAGEAL DILATION  07/06/2018   Procedure: ESOPHAGEAL DILATION;  Surgeon: Rogene Houston, MD;  [facility];  Service: Endoscopy;;  . ESOPHAGOGASTRODUODENOSCOPY N/A 07/06/2018   Procedure: ESOPHAGOGASTRODUODENOSCOPY (EGD);  Surgeon: Rogene Houston, MD;  [facility];  Service: Endoscopy;  Laterality: N/A;  . ESOPHAGOGASTRODUODENOSCOPY N/A 08/16/2018   Procedure: ESOPHAGOGASTRODUODENOSCOPY (EGD);  Surgeon: Milus Banister, MD;  [facility];  Service: Endoscopy;  Laterality: N/A;  . ESOPHAGOGASTRODUODENOSCOPY (EGD) WITH PROPOFOL N/A 03/25/2019   Procedure: ESOPHAGOGASTRODUODENOSCOPY (EGD) WITH PROPOFOL;  Surgeon: Rush Landmark Telford Nab., MD;  Location: [place];  Service: Gastroenterology;  Laterality: N/A;  . EUS N/A 08/16/2018   Procedure: UPPER ENDOSCOPIC ULTRASOUND (EUS) RADIAL;  Surgeon: Milus Banister, MD;  Location: [facility];  Service: Endoscopy;  Laterality: N/A;  . EUS N/A 03/25/2019   Procedure: UPPER ENDOSCOPIC ULTRASOUND (EUS) RADIAL;  Surgeon: Rush Landmark Telford Nab., MD;  Location: [place];  Service: Gastroenterology;  Laterality: N/A;  . FOREIGN BODY REMOVAL  07/06/2018   Procedure: FOREIGN BODY REMOVAL;  Surgeon: Rogene Houston, MD;  [facility];  Service: Endoscopy;;  . HERNIA REPAIR     umbicical  . LEFT HEART CATHETERIZATION WITH CORONARY ANGIOGRAM N/A 03/05/2012   Procedure: LEFT HEART CATHETERIZATION WITH CORONARY ANGIOGRAM;  Surgeon:  Hillary Bow, MD;  Location: [facility] CATH LAB;  Service: Cardiovascular;  Laterality: N/A;  . LEFT HEART CATHETERIZATION WITH CORONARY ANGIOGRAM N/A 10/23/2013   Procedure: LEFT HEART CATHETERIZATION WITH CORONARY ANGIOGRAM;  Surgeon: Blane Ohara, MD;  Location: [facility] CATH LAB;  Service: Cardiovascular;  Laterality: N/A;  . LEFT HEART CATHETERIZATION WITH CORONARY/GRAFT ANGIOGRAM  10/22/2013   Procedure: LEFT HEART CATHETERIZATION WITH Beatrix Fetters;  Surgeon: Leonie Man, MD;  Location: [facility] CATH LAB;  Service: Cardiovascular;;  . PANCREATIC STENT PLACEMENT  03/25/2019   Procedure: PANCREATIC STENT PLACEMENT;  Surgeon: Rush Landmark Telford Nab., MD;  Location: [place];  Service: Gastroenterology;;  . PERCUTANEOUS CORONARY STENT INTERVENTION (PCI-S) N/A 03/07/2012   Procedure: PERCUTANEOUS CORONARY STENT INTERVENTION (PCI-S);  Surgeon: Sherren Mocha, MD;  Location: [facility] CATH LAB;  Service: Cardiovascular;  Laterality: N/A;  . PERCUTANEOUS CORONARY STENT INTERVENTION (PCI-S) Right 10/23/2013   Procedure: PERCUTANEOUS CORONARY STENT INTERVENTION (PCI-S);  Surgeon: Blane Ohara, MD;  Location: [facility] CATH LAB;  Service: Cardiovascular;  Laterality: Right;  . POLYPECTOMY  07/06/2018   Procedure: POLYPECTOMY;  Surgeon: Rogene Houston, MD;  [facility];  Service: Endoscopy;;  polyp at ampulla  . POLYPECTOMY  03/25/2019   Procedure: AMPULLECTOMY;  Surgeon: Mansouraty, Telford Nab., MD;  Location: [place];  Service: Gastroenterology;;  . Right hand surgery    . SPHINCTEROTOMY  03/25/2019   Procedure: SPHINCTEROTOMY;  Surgeon: Mansouraty, Telford Nab., MD;  Location: [place];  Service: Gastroenterology;;  . TIBIA IM NAIL INSERTION Right 04/07/2016   Procedure: FIXATION OF RIGHT TIBIA FRACTURE;  Surgeon: Garald Balding, MD;  Location: [place];  Service: Orthopedics;  Laterality: Right;     A IV Location/Drains/Wounds Patient Lines/Drains/Airways Status   Active  Line/Drains/Airways    Name:   Placement date:   Placement time:   Site:   Days:   Peripheral IV 05/31/19 Left Forearm   05/31/19    2315    Forearm   1   GI Stent 4 Fr.   03/25/19    1400    --   68   Incision (Closed) 04/07/16 Leg Right   04/07/16    0928     1150          Intake/Output Last 24 hours No intake or output data in the 24 hours ending 06/01/19 1610  Labs/Imaging Results for orders placed or performed during the hospital encounter of 05/31/19 (from the past 48 hour(s))  Lactic acid, plasma     Status: None   Collection Time: 05/31/19 11:18 PM  Result Value Ref Range   Lactic Acid, Venous 0.8 0.5 - 1.9 mmol/L    Comment: Performed at [facility], [address] [place], [place] [address]  Comprehensive metabolic panel     Status: Abnormal   Collection Time: 05/31/19 11:18 PM  Result Value Ref Range   Sodium 139 135 - 145 mmol/L   Potassium 3.8 3.5 - 5.1 mmol/L   Chloride 95 (L) 98 - 111 mmol/L   CO2 32 22 - 32 mmol/L   Glucose, Bld 144 (H) 70 - 99 mg/dL   BUN 23  8 - 23 mg/dL   Creatinine, Ser 1.24 0.61 - 1.24 mg/dL   Calcium 9.0 8.9 - 10.3 mg/dL   Total Protein 7.6 6.5 - 8.1 g/dL   Albumin 3.7 3.5 - 5.0 g/dL   AST 16 15 - 41 U/L   ALT 16 0 - 44 U/L   Alkaline Phosphatase 62 38 - 126 U/L   Total Bilirubin 0.5 0.3 - 1.2 mg/dL   GFR calc non Af Amer >60 >60 mL/min   GFR calc Af Amer >60 >60 mL/min   Anion gap 12 5 - 15    Comment: Performed at [facility], [address], [place], [place] [address]  CBC WITH DIFFERENTIAL     Status: Abnormal   Collection Time: 05/31/19 11:18 PM  Result Value Ref Range   WBC 9.6 4.0 - 10.5 K/uL   RBC 3.53 (L) 4.22 - 5.81 MIL/uL   Hemoglobin 11.2 (L) 13.0 - 17.0 g/dL   HCT 35.9 (L) 39.0 - 52.0 %   MCV 101.7 (H) 80.0 - 100.0 fL   MCH 31.7 26.0 - 34.0 pg   MCHC 31.2 30.0 - 36.0 g/dL   RDW 12.9 11.5 - 15.5 %   Platelets 73 (L) 150 - 400 K/uL    Comment: PLATELET COUNT CONFIRMED BY SMEAR SPECIMEN CHECKED FOR CLOTS    nRBC  0.0 0.0 - 0.2 %   Neutrophils Relative % 62 %   Neutro Abs 6.0 1.7 - 7.7 K/uL   Lymphocytes Relative 25 %   Lymphs Abs 2.4 0.7 - 4.0 K/uL   Monocytes Relative 12 %   Monocytes Absolute 1.2 (H) 0.1 - 1.0 K/uL   Eosinophils Relative 0 %   Eosinophils Absolute 0.0 0.0 - 0.5 K/uL   Basophils Relative 0 %   Basophils Absolute 0.0 0.0 - 0.1 K/uL   Immature Granulocytes 1 %   Abs Immature Granulocytes 0.05 0.00 - 0.07 K/uL    Comment: Performed at [facility], [address], [place], [place] [address]  APTT     Status: None   Collection Time: 05/31/19 11:18 PM  Result Value Ref Range   aPTT 24 24 - 36 seconds    Comment: Performed at [facility], [address], [place], [place] [address]  Protime-INR     Status: None   Collection Time: 05/31/19 11:18 PM  Result Value Ref Range   Prothrombin Time 13.2 11.4 - 15.2 seconds   INR 1.0 0.8 - 1.2    Comment: (NOTE) INR goal varies based on device and disease states. Performed at [facility], [address] [place], [place] [address]   BNP     Status: Abnormal   Collection Time: 05/31/19 11:18 PM  Result Value Ref Range   B Natriuretic Peptide 248.0 (H) 0.0 - 100.0 pg/mL    Comment: Performed at [facility], [address] [place], [place] [address]  Urinalysis, Routine w reflex microscopic     Status: Abnormal   Collection Time: 06/01/19 12:58 AM  Result Value Ref Range   Color, Urine AMBER (A) YELLOW    Comment: BIOCHEMICALS MAY BE AFFECTED BY COLOR   APPearance CLEAR CLEAR   Specific Gravity, Urine 1.024 1.005 - 1.030   pH 5.0 5.0 - 8.0   Glucose, UA NEGATIVE NEGATIVE mg/dL   Hgb urine dipstick NEGATIVE NEGATIVE   Bilirubin Urine SMALL (A) NEGATIVE   Ketones, ur NEGATIVE NEGATIVE mg/dL   Protein, ur 30 (A) NEGATIVE mg/dL   Nitrite NEGATIVE NEGATIVE  Leukocytes,Ua NEGATIVE NEGATIVE   RBC / HPF 0-5 0 - 5 RBC/hpf   WBC, UA 11-20 0 - 5 WBC/hpf   Bacteria, UA NONE SEEN NONE SEEN   Squamous Epithelial / LPF 0-5 0 - 5   Mucus  PRESENT    Hyaline Casts, UA PRESENT     Comment: Performed at [facility], [address] [place], [place] [address]  POC SARS Coronavirus 2 Ag-ED - Nasal Swab (BD Veritor Kit)     Status: None   Collection Time: 06/01/19 12:59 AM  Result Value Ref Range   SARS Coronavirus 2 Ag NEGATIVE NEGATIVE    Comment: (NOTE) SARS-CoV-2 antigen NOT DETECTED.  Negative results are presumptive.  Negative results do not preclude SARS-CoV-2 infection and should not be used as the sole basis for treatment or other patient management decisions, including infection  control decisions, particularly in the presence of clinical signs and  symptoms consistent with COVID-19, or in those who have been in contact with the virus.  Negative results must be combined with clinical observations, patient history, and epidemiological information. The expected result is Negative. Fact Sheet for Patients: PodPark.tn Fact Sheet for Healthcare Providers: GiftContent.is This test is not yet approved or cleared by the [place] FDA and  has been authorized for detection and/or diagnosis of SARS-CoV-2 by FDA under an Emergency Use Authorization (EUA).  This EUA will remain in effect (meaning this test can be used) for the duration of  the COVID-19 de claration under Section 564(b)(1) of the Act, 21 U.S.C. section 360bbb-3(b)(1), unless the authorization is terminated or revoked sooner.   SARS Coronavirus 2 by RT PCR (hospital order, performed in [place] hospital lab)     Status: None   Collection Time: 06/01/19  1:14 AM  Result Value Ref Range   SARS Coronavirus 2 NEGATIVE NEGATIVE    Comment: (NOTE) SARS-CoV-2 target nucleic acids are NOT DETECTED. The SARS-CoV-2 RNA is generally detectable in upper and lower respiratory specimens during the acute phase of infection. The lowest concentration of SARS-CoV-2 viral copies this assay can detect is  250 copies / mL. A negative result does not preclude SARS-CoV-2 infection and should not be used as the sole basis for treatment or other patient management decisions.  A negative result may occur with improper specimen collection / handling, submission of specimen other than nasopharyngeal swab, presence of viral mutation(s) within the areas targeted by this assay, and inadequate number of viral copies (<250 copies / mL). A negative result must be combined with clinical observations, patient history, and epidemiological information. Fact Sheet for Patients:   StrictlyIdeas.no Fact Sheet for Healthcare Providers: BankingDealers.co.za This test is not yet approved or cleared  by the [place] FDA and has been authorized for detection and/or diagnosis of SARS-CoV-2 by FDA under an Emergency Use Authorization (EUA).  This EUA will remain in effect (meaning this test can be used) for the duration of the COVID-19 declaration under Section 564(b)(1) of the Act, 21 U.S.C. section 360bbb-3(b)(1), unless the authorization is terminated or revoked sooner. Performed at [facility], [address] [place], [place] [address]   CBC     Status: Abnormal   Collection Time: 06/01/19  5:10 AM  Result Value Ref Range   WBC 12.7 (H) 4.0 - 10.5 K/uL   RBC 4.36 4.22 - 5.81 MIL/uL   Hemoglobin 13.8 13.0 - 17.0 g/dL   HCT 44.4 39.0 - 52.0 %   MCV 101.8 (H) 80.0 - 100.0 fL  MCH 31.7 26.0 - 34.0 pg   MCHC 31.1 30.0 - 36.0 g/dL   RDW 12.9 11.5 - 15.5 %   Platelets 224 150 - 400 K/uL    Comment: REPEATED TO VERIFY DELTA CHECK NOTED    nRBC 0.0 0.0 - 0.2 %    Comment: Performed at [facility], [address] [place], [place] [address]  Procalcitonin - Baseline     Status: None   Collection Time: 06/01/19  5:10 AM  Result Value Ref Range   Procalcitonin <0.10 ng/mL    Comment:        Interpretation: PCT (Procalcitonin) <= 0.5 ng/mL: Systemic  infection (sepsis) is not likely. Local bacterial infection is possible. (NOTE)       Sepsis PCT Algorithm           Lower Respiratory Tract                                      Infection PCT Algorithm    ----------------------------     ----------------------------         PCT < 0.25 ng/mL                PCT < 0.10 ng/mL         Strongly encourage             Strongly discourage   discontinuation of antibiotics    initiation of antibiotics    ----------------------------     -----------------------------       PCT 0.25 - 0.50 ng/mL            PCT 0.10 - 0.25 ng/mL               OR       >80% decrease in PCT            Discourage initiation of                                            antibiotics      Encourage discontinuation           of antibiotics    ----------------------------     -----------------------------         PCT >= 0.50 ng/mL              PCT 0.26 - 0.50 ng/mL               AND        <80% decrease in PCT             Encourage initiation of                                             antibiotics       Encourage continuation           of antibiotics    ----------------------------     -----------------------------        PCT >= 0.50 ng/mL                  PCT > 0.50 ng/mL               AND  increase in PCT                  Strongly encourage                                      initiation of antibiotics    Strongly encourage escalation           of antibiotics                                     -----------------------------                                           PCT <= 0.25 ng/mL                                                 OR                                        > 80% decrease in PCT                                     Discontinue / Do not initiate                                             antibiotics Performed at [facility], [address] [place], [place] [address]    DG Chest Port 1 View  Result Date: 05/31/2019 CLINICAL DATA:  Sepsis  EXAM: PORTABLE CHEST 1 VIEW COMPARISON:  04/07/2016 FINDINGS: The heart size is significantly enlarged. The patient is status post prior median sternotomy. Scattered pulmonary opacities are noted bilaterally but greatest in the right upper and right mid lung zones. The lungs are hyperexpanded. There is no significant pleural effusion. There is no pneumothorax. IMPRESSION: 1. Scattered pulmonary opacities bilaterally, greatest in the right upper and right mid lung zones. Findings are concerning for multifocal pneumonia (viral or bacterial). 2. Cardiomegaly. Electronically Signed   By: Constance Holster M.D.   On: 05/31/2019 23:43    Pending Labs Unresulted Labs (From admission, onward)    Start     Ordered   06/08/19 0500  Creatinine, serum  (enoxaparin (LOVENOX)    CrCl >/= 30 ml/min)  Weekly,   R    Comments: while on enoxaparin therapy    06/01/19 0417   06/01/19 0500  Comprehensive metabolic panel  Tomorrow morning,   R     06/01/19 0417   06/01/19 0423  Strep pneumoniae urinary antigen  Once,   STAT     06/01/19 0422   06/01/19 0417  HIV Antibody (routine testing w rflx)  (HIV Antibody (Routine testing w reflex) panel)  Once,   STAT     06/01/19 0417   05/31/19 2312  Blood Culture (routine x 2)  BLOOD CULTURE X 2,   STAT     05/31/19 2312   05/31/19 2312  Urine culture  ONCE - STAT,   STAT     05/31/19 2312          Vitals/Pain Today's Vitals   06/01/19 0330 06/01/19 0520 06/01/19 0530 06/01/19 0600  BP: 125/66 (!) 106/57 124/66 115/60  Pulse: 69 68 68 (!) 54  Resp: (!) 24 19 (!) 23 (!) 22  Temp:  99 F (37.2 C)    TempSrc:  Oral    SpO2: 92% 92% 92% 94%  Weight:      Height:      PainSc:  0-No pain      Isolation Precautions Airborne and Contact precautions  Medications Medications  azithromycin (ZITHROMAX) 500 mg in sodium chloride 0.9 % 250 mL IVPB (0 mg Intravenous Stopped 06/01/19 0115)  aspirin EC tablet 81 mg (has no administration in time range)  amLODipine  (NORVASC) tablet 5 mg (has no administration in time range)  carvedilol (COREG) tablet 6.25 mg (has no administration in time range)  furosemide (LASIX) tablet 40 mg (has no administration in time range)  lisinopril (ZESTRIL) tablet 20 mg (has no administration in time range)  ranolazine (RANEXA) 12 hr tablet 1,000 mg (has no administration in time range)  rosuvastatin (CRESTOR) tablet 40 mg (has no administration in time range)  pantoprazole (PROTONIX) EC tablet 40 mg (has no administration in time range)  ipratropium-albuterol (DUONEB) 0.5-2.5 (3) MG/3ML nebulizer solution 3 mL (3 mLs Inhalation Not Given 06/01/19 0521)  enoxaparin (LOVENOX) injection 40 mg (has no administration in time range)  0.9 %  sodium chloride infusion ( Intravenous New Bag/Given 06/01/19 0525)  ondansetron (ZOFRAN) tablet 4 mg (has no administration in time range)    Or  ondansetron (ZOFRAN) injection 4 mg (has no administration in time range)  acetaminophen (TYLENOL) tablet 650 mg (has no administration in time range)    Or  acetaminophen (TYLENOL) suppository 650 mg (has no administration in time range)  cefTRIAXone (ROCEPHIN) 1 g in sodium chloride 0.9 % 100 mL IVPB (has no administration in time range)  dexamethasone (DECADRON) injection 6 mg (6 mg Intravenous Given 05/31/19 2327)  albuterol (VENTOLIN HFA) 108 (90 Base) MCG/ACT inhaler 2 puff (2 puffs Inhalation Given 05/31/19 2329)  cefTRIAXone (ROCEPHIN) 2 g in sodium chloride 0.9 % 100 mL IVPB (0 g Intravenous Stopped 06/01/19 0012)    Mobility walks Low fall risk   Focused Assessments    R Recommendations: See Admitting Provider Note  Report given to:   Additional Notes:

## 2019-06-01 NOTE — Progress Notes (Signed)
Assumed care of this patient around 1530. Pt in no distress, did not need anything from the nursing staff at the time. Oxygen extension tubing added. Will continue to monitor.

## 2019-06-01 NOTE — Progress Notes (Signed)
Patient seen and examined.  Admitted after midnight secondary to shortness of breath and productive cough.  Patient reports no fevers, but expressed chills at home.  Vital signs currently stable; POC COVID-19 test negative and SARS-COV-2 RT-PCR negative as well.  Still short of breath, tachypneic with minimal exertion and requiring 4-6 L nasal cannula supplementation.  Chest x-ray has demonstrated multifocal pneumonia.  Please refer to H&P written by Dr. Darrick Meigs on 06/01/19 for further info/details on admission.  Plan: -Follow cultures results -Continue current IV antibiotics -Continue supportive care -Continue bronchodilators, start expectorants and start the use of flutter valve -Will use pulmicort and wean oxygen supplementation as tolerated. -follow clinical response.  Barton Dubois MD [phone]

## 2019-06-02 DIAGNOSIS — Z720 Tobacco use: Secondary | ICD-10-CM

## 2019-06-02 DIAGNOSIS — E785 Hyperlipidemia, unspecified: Secondary | ICD-10-CM

## 2019-06-02 DIAGNOSIS — N179 Acute kidney failure, unspecified: Secondary | ICD-10-CM

## 2019-06-02 DIAGNOSIS — J441 Chronic obstructive pulmonary disease with (acute) exacerbation: Secondary | ICD-10-CM

## 2019-06-02 DIAGNOSIS — I2510 Atherosclerotic heart disease of native coronary artery without angina pectoris: Secondary | ICD-10-CM

## 2019-06-02 LAB — URINE CULTURE: Culture: NO GROWTH

## 2019-06-02 MED ORDER — PREDNISONE 20 MG PO TABS
40.0000 mg | ORAL_TABLET | Freq: Every day | ORAL | Status: DC
  Administered 2019-06-03 – 2019-06-04 (×2): 40 mg via ORAL
  Filled 2019-06-02 (×2): qty 2

## 2019-06-02 MED ORDER — ARFORMOTEROL TARTRATE 15 MCG/2ML IN NEBU
15.0000 ug | INHALATION_SOLUTION | Freq: Two times a day (BID) | RESPIRATORY_TRACT | Status: DC
  Administered 2019-06-03 – 2019-06-04 (×3): 15 ug via RESPIRATORY_TRACT
  Filled 2019-06-02 (×3): qty 2

## 2019-06-02 NOTE — Progress Notes (Signed)
PROGRESS NOTE    Clifford Jones  [id number] DOB: 12-Mar-1951 DOA: 05/31/2019 PCP: Lavella Lemons, PA     Brief Narrative:  As per H&P written by Dr. Darrick Meigs on 06/01/2019 68 y.o. male,*with history of CAD s/p CABG in 2003, hypertension, hyperlipidemia, ampullary adenoma status post ampullectomy came to ED with chief complaints of fever, cough and shortness of breath for past 4 days.  Patient denies chest pain.  He has been coughing up yellow phlegm.  Denies dysuria or abdominal pain.  No weakness or numbness of upper or lower extremities. In the ED chest x-ray showed scattered pulmonary opacities bilaterally greatest in the right upper and right midlung zone.  Findings concerning for multifocal pneumonia.  Initial POC COVID-19 test is negative, SARS-CoV-2 RT-PCR is currently pending. Patient is requiring 6 L/min of oxygen. He is not on oxygen at home. Patient started on ceftriaxone and Zithromax.   Assessment & Plan: 1-acute hypoxemic respiratory failure secondary to multifocal CAP (community acquired pneumonia) -Continue to wean off oxygen supplementation as tolerated. -Patient is still short of breath with minimal exertion, productive cough, inability to speak in full sentences and requiring 6 L nasal cannula supplementation to maintain O2 sat above 90%. -Continue IV antibiotics -Continue nebulizer management; start prednisone, continue flutter valve and supportive care. -COVID 19- neg -follow cultures reports.  2-COPD exacerbation -continue nebulizer management -started on mucinex and flutter valve -start prednisone -wean off O 2 supplementation.  3-tobacco abuse -cessation counseling provided. -patient decline nicotine patch  4-AKI -in the setting of mild hypotension and continue use of nephrotoxic agents -will stop diuretics and lisinopril -follow renal function trend -advise to maintain adequate hydration.  5-HLD -continue statins  6-Coronary artery  disease -Overall stable -No complaining of chest pain -Continue ASA, statins, beta-blocker and Ranexa  7-GERD -continue PPI   DVT prophylaxis: Lovenox Code Status: Full code Family Communication: Wife at bedside. Disposition Plan: Remains inpatient.  Continue weaning off oxygen supplementation as tolerated, continue current IV antibiotics, start steroids, nebulizer management, flutter valve and supportive care.  Consultants:   None  Procedures:   See below for x-ray reports.  Antimicrobials:  Anti-infectives (From admission, onward)   Start     Dose/Rate Route Frequency Ordered Stop   06/01/19 1500  cefTRIAXone (ROCEPHIN) 1 g in sodium chloride 0.9 % 100 mL IVPB     1 g 200 mL/hr over 30 Minutes Intravenous Every 24 hours 06/01/19 0417     05/31/19 2315  cefTRIAXone (ROCEPHIN) 2 g in sodium chloride 0.9 % 100 mL IVPB     2 g 200 mL/hr over 30 Minutes Intravenous  Once 05/31/19 2314 06/01/19 0012   05/31/19 2315  azithromycin (ZITHROMAX) 500 mg in sodium chloride 0.9 % 250 mL IVPB     500 mg 250 mL/hr over 60 Minutes Intravenous Every 24 hours 05/31/19 2314         Subjective: Currently afebrile, denies chest pain, no nausea, no vomiting. Still complaining of shortness of breath, inability to speak in full sentences requiring 6 L nasal cannula supplementation.  Objective: Vitals:   06/02/19 0749 06/02/19 1426 06/02/19 1500 06/02/19 1925  BP:   131/66   Pulse:   64   Resp:   20   Temp:   97.7 F (36.5 C)   TempSrc:   Oral   SpO2: 92% 91% 91% 95%  Weight:      Height:        Intake/Output Summary (Last 24 hours) at 06/02/2019 1940  Last data filed at 06/02/2019 0644 Gross per 24 hour  Intake 250 ml  Output 300 ml  Net -50 ml   Filed Weights   05/31/19 2237 06/01/19 0852  Weight: 104.3 kg 102.5 kg    Examination: General exam: Alert, awake, oriented x 3; still SOB and requiring O2 sat [place] 6L. No nausea, no vomiting. Positive productive coughing  spells. Respiratory system: Decreased air movement positive expiratory wheezing and diffuse rhonchi; no using accessory muscles. Cardiovascular system: RRR. No murmurs, rubs, gallops. Gastrointestinal system: Abdomen is nondistended, soft and nontender. No organomegaly or masses felt. Normal bowel sounds heard. Central nervous system: Alert and oriented. No focal neurological deficits. Extremities: No Cyanosis or clubbing, no edema. Skin: No rashes, no petechiae. Psychiatry: Judgement and insight appear normal. Mood & affect appropriate.     Data Reviewed: I have personally reviewed following labs and imaging studies  CBC: Recent Labs  Lab 05/31/19 2318 06/01/19 0510  WBC 9.6 12.7*  NEUTROABS 6.0  --   HGB 11.2* 13.8  HCT 35.9* 44.4  MCV 101.7* 101.8*  PLT 73* [id number]   Basic Metabolic Panel: Recent Labs  Lab 05/31/19 2318 06/01/19 0510  NA 139 137  K 3.8 4.5  CL 95* 95*  CO2 32 31  GLUCOSE 144* 261*  BUN 23 25*  CREATININE 1.24 1.46*  CALCIUM 9.0 8.8*   GFR: Estimated Creatinine Clearance: 61.7 mL/min (A) (by C-G formula based on SCr of 1.46 mg/dL (H)).   Liver Function Tests: Recent Labs  Lab 05/31/19 2318 06/01/19 0510  AST 16 17  ALT 16 15  ALKPHOS 62 59  BILITOT 0.5 0.3  PROT 7.6 7.2  ALBUMIN 3.7 3.4*   Coagulation Profile: Recent Labs  Lab 05/31/19 2318  INR 1.0   Urine analysis:    Component Value Date/Time   COLORURINE AMBER (A) 06/01/2019 0058   APPEARANCEUR CLEAR 06/01/2019 0058   LABSPEC 1.024 06/01/2019 0058   PHURINE 5.0 06/01/2019 0058   GLUCOSEU NEGATIVE 06/01/2019 0058   HGBUR NEGATIVE 06/01/2019 0058   BILIRUBINUR SMALL (A) 06/01/2019 0058   KETONESUR NEGATIVE 06/01/2019 0058   PROTEINUR 30 (A) 06/01/2019 0058   NITRITE NEGATIVE 06/01/2019 0058   LEUKOCYTESUR NEGATIVE 06/01/2019 0058    Recent Results (from the past 240 hour(s))  Blood Culture (routine x 2)     Status: None (Preliminary result)   Collection Time: 05/31/19  11:36 PM   Specimen: BLOOD RIGHT HAND  Result Value Ref Range Status   Specimen Description BLOOD RIGHT HAND  Final   Special Requests NONE  Final   Culture   Final    NO GROWTH 2 DAYS Performed at [facility], [address], [place], [place] [address]    Report Status PENDING  Incomplete  Blood Culture (routine x 2)     Status: None (Preliminary result)   Collection Time: 05/31/19 11:41 PM   Specimen: BLOOD LEFT HAND  Result Value Ref Range Status   Specimen Description BLOOD LEFT HAND  Final   Special Requests NONE  Final   Culture   Final    NO GROWTH 2 DAYS Performed at [facility], [address] [place], [place] [address]    Report Status PENDING  Incomplete  Urine culture     Status: None   Collection Time: 06/01/19 12:58 AM   Specimen: In/Out Cath Urine  Result Value Ref Range Status   Specimen Description   Final    IN/OUT CATH URINE Performed at [facility],  [address] [place], [place] [address]    Special Requests   Final    NONE Performed at [facility], [address], [place], [place] [address]    Culture   Final    NO GROWTH Performed at [facility], [place] [address] [place], [place] [address]    Report Status 06/02/2019 FINAL  Final  SARS Coronavirus 2 by RT PCR (hospital order, performed in [place] hospital lab)     Status: None   Collection Time: 06/01/19  1:14 AM  Result Value Ref Range Status   SARS Coronavirus 2 NEGATIVE NEGATIVE Final    Comment: (NOTE) SARS-CoV-2 target nucleic acids are NOT DETECTED. The SARS-CoV-2 RNA is generally detectable in upper and lower respiratory specimens during the acute phase of infection. The lowest concentration of SARS-CoV-2 viral copies this assay can detect is 250 copies / mL. A negative result does not preclude SARS-CoV-2 infection and should not be used as the sole basis for treatment or other patient management decisions.  A negative result may occur with improper specimen collection /  handling, submission of specimen other than nasopharyngeal swab, presence of viral mutation(s) within the areas targeted by this assay, and inadequate number of viral copies (<250 copies / mL). A negative result must be combined with clinical observations, patient history, and epidemiological information. Fact Sheet for Patients:   StrictlyIdeas.no Fact Sheet for Healthcare Providers: BankingDealers.co.za This test is not yet approved or cleared  by the [place] FDA and has been authorized for detection and/or diagnosis of SARS-CoV-2 by FDA under an Emergency Use Authorization (EUA).  This EUA will remain in effect (meaning this test can be used) for the duration of the COVID-19 declaration under Section 564(b)(1) of the Act, 21 U.S.C. section 360bbb-3(b)(1), unless the authorization is terminated or revoked sooner. Performed at [facility], [address] [place], [place] [address]      Radiology Studies: [facility] 1 View  Result Date: 05/31/2019 CLINICAL DATA:  Sepsis EXAM: PORTABLE CHEST 1 VIEW COMPARISON:  04/07/2016 FINDINGS: The heart size is significantly enlarged. The patient is status post prior median sternotomy. Scattered pulmonary opacities are noted bilaterally but greatest in the right upper and right mid lung zones. The lungs are hyperexpanded. There is no significant pleural effusion. There is no pneumothorax. IMPRESSION: 1. Scattered pulmonary opacities bilaterally, greatest in the right upper and right mid lung zones. Findings are concerning for multifocal pneumonia (viral or bacterial). 2. Cardiomegaly. Electronically Signed   By: Constance Holster M.D.   On: 05/31/2019 23:43    Scheduled Meds: . arformoterol  15 mcg Nebulization BID  . aspirin EC  81 mg Oral Daily  . budesonide (PULMICORT) nebulizer solution  0.5 mg Nebulization BID  . carvedilol  6.25 mg Oral BID  . dextromethorphan-guaiFENesin  1 tablet  Oral BID  . enoxaparin (LOVENOX) injection  40 mg Subcutaneous Q24H  . furosemide  40 mg Oral Daily  . ipratropium-albuterol  3 mL Inhalation Q6H  . lisinopril  20 mg Oral Daily  . pantoprazole  40 mg Oral BID  . [START ON 06/03/2019] predniSONE  40 mg Oral Q breakfast  . ranolazine  1,000 mg Oral BID WC  . rosuvastatin  40 mg Oral Daily   Continuous Infusions: . sodium chloride 10 mL/hr at 06/01/19 0525  . azithromycin 500 mg (06/01/19 2331)  . cefTRIAXone (ROCEPHIN)  IV 1 g (06/02/19 1516)     LOS: 1 day    Time  spent: 30 minutes.   Barton Dubois, MD [facility] Pager [phone]  06/02/2019, 7:40 PM

## 2019-06-03 LAB — BASIC METABOLIC PANEL
Anion gap: 9 (ref 5–15)
BUN: 20 mg/dL (ref 8–23)
CO2: 38 mmol/L — ABNORMAL HIGH (ref 22–32)
Calcium: 9.1 mg/dL (ref 8.9–10.3)
Chloride: 95 mmol/L — ABNORMAL LOW (ref 98–111)
Creatinine, Ser: 0.88 mg/dL (ref 0.61–1.24)
GFR calc Af Amer: >60 mL/min (ref >60)
GFR calc non Af Amer: >60 mL/min (ref >60)
Glucose, Bld: 125 mg/dL — ABNORMAL HIGH (ref 70–99)
Potassium: 4.1 mmol/L (ref 3.5–5.1)
Sodium: 142 mmol/L (ref 135–145)

## 2019-06-03 NOTE — Progress Notes (Signed)
PROGRESS NOTE    Patient: Clifford Jones                            PCP: Lavella Lemons, [place]                    DOB: 03/30/1951            DOA: 05/31/2019 [id number]             DOS: 06/03/2019, 12:19 PM   LOS: 2 days   Date of Service: The patient was seen and examined on 06/03/2019  Subjective:   The patient was seen and examined this morning, remained stable in bed, remains to be on 7 L of oxygen, maintaining O2 sat greater than 92% Plain of shortness of breath especially with exertion.   Brief Narrative:  As per H&P written by Dr. Darrick Meigs on 06/01/2019 68 y.o.male,*with history of CAD s/p CABG in 2003, hypertension, hyperlipidemia, ampullary adenoma status post ampullectomy came to ED with chief complaints of fever, cough and shortness of breath for past 4 days. Patient denies chest pain. He has been coughing up yellow phlegm. Denies dysuria or abdominal pain. No weakness or numbness of upper or lower extremities. In the ED chest x-ray showed scattered pulmonary opacities bilaterally greatest in the right upper and right midlung zone. Findings concerning for multifocal pneumonia. Initial POC COVID-19 test is negative, SARS-CoV-2 RT-PCR was negative Patient is requiring6L/min of oxygen. He is not on oxygen at home. Patient started on ceftriaxone and Zithromax.  06/03/2019: Patient was seen and examined, still requiring up to 7 L of oxygen to maintain O2 sat greater than 90%, still experiencing excessive shortness of breath especially with exertion  Assessment & Plan:   Active Problems:   CAP (community acquired pneumonia)   Acute hypoxemic respiratory failure (HCC)  Acute hypoxemic respiratory failure secondary to multifocal CAP (community acquired pneumonia) -Patient remains on 7 L of oxygen, to maintain O2 sat greater 92%, -Patient still have excessive shortness of breath especially with exertion -Continue DuoNeb, O2 supplements, with goal to wean the patient down  to minimal O2 needed -Continue incentive spirometer,  -Continue IV steroids with taper -Cultures negative to date -SARS-CoV-2 negative on admission (no other signs of Covid-no need for repeated test)   COPD exacerbation  -Per patient he is not formally diagnosed and not O2 dependent -Confirms lifelong history of tobacco abuse, quit a month ago -continue nebulizer management -started on mucinex and flutter valve -on  prednisone -wean off O 2 supplementation... Still requiring 7 L of oxygen  Tobacco abuse -Confirms lifelong history of tobacco abuse, quit a month ago -cessation counseling provided. -patient decline nicotine patch   Acute renal insufficiency -In the setting of hypertension, and nephrotoxins -With holding diuretics, lisinopril -Improved BUN/creatinine -Avoiding nephrotoxins -Improved  Hyperlipidemia -Continue statins  Coronary artery disease -Stable denies any chest pain -Continue supportive therapy, - Continue current home meds aspirin, statins, beta-blocker, Ranexa  Cultures; 05/31/2019 blood cultures x2-negative to date 06/01/2019 urine culture-negative to date 06/01/2019 SARS-CoV-2 negative  Antimicrobials: 05/31/2019-azithromycin >> 06/01/2019 Rocephin >>    DVT prophylaxis: SCD/Compression stockings Lovenox SQ Code Status:   Code Status: Full Code Family Communication: No family member present at bedside- attempt will be made to update daily The above findings and plan of care has been discussed with patient and family in detail,  they expressed understanding and agreement of above. Disposition Plan:   Anticipated  1-2 days Admission status:  NPATIEN -due to persistent hypoxia, respiratory failure, COPD exacerbation,  Consultants: None  Procedures:   No admission procedures for hospital encounter.     Antimicrobials:  Anti-infectives (From admission, onward)   Start     Dose/Rate Route Frequency Ordered Stop   06/01/19 1500   cefTRIAXone (ROCEPHIN) 1 g in sodium chloride 0.9 % 100 mL IVPB     1 g 200 mL/hr over 30 Minutes Intravenous Every 24 hours 06/01/19 0417     05/31/19 2315  cefTRIAXone (ROCEPHIN) 2 g in sodium chloride 0.9 % 100 mL IVPB     2 g 200 mL/hr over 30 Minutes Intravenous  Once 05/31/19 2314 06/01/19 0012   05/31/19 2315  azithromycin (ZITHROMAX) 500 mg in sodium chloride 0.9 % 250 mL IVPB     500 mg 250 mL/hr over 60 Minutes Intravenous Every 24 hours 05/31/19 2314         Medication:  . arformoterol  15 mcg Nebulization BID  . aspirin EC  81 mg Oral Daily  . budesonide (PULMICORT) nebulizer solution  0.5 mg Nebulization BID  . carvedilol  6.25 mg Oral BID  . dextromethorphan-guaiFENesin  1 tablet Oral BID  . enoxaparin (LOVENOX) injection  40 mg Subcutaneous Q24H  . ipratropium-albuterol  3 mL Inhalation Q6H  . pantoprazole  40 mg Oral BID  . predniSONE  40 mg Oral Q breakfast  . ranolazine  1,000 mg Oral BID WC  . rosuvastatin  40 mg Oral Daily    acetaminophen **OR** acetaminophen, ondansetron **OR** ondansetron (ZOFRAN) IV   Objective:   Vitals:   06/03/19 0800 06/03/19 1027 06/03/19 1037 06/03/19 1046  BP: (!) 145/83     Pulse: 71     Resp: 20     Temp: 97.7 F (36.5 C)     TempSrc: Oral     SpO2: 92% 91% 96% 95%  Weight:      Height:        Intake/Output Summary (Last 24 hours) at 06/03/2019 1219 Last data filed at 06/03/2019 [id number] Gross per 24 hour  Intake 490 ml  Output 600 ml  Net -110 ml   Filed Weights   05/31/19 2237 06/01/19 0852  Weight: 104.3 kg 102.5 kg     Examination:   Physical Exam  Constitution:  Alert, cooperative, no distress,  Appears calm and comfortable  Psychiatric: Normal and stable mood and affect, cognition intact,   HEENT: Normocephalic, PERRL, otherwise with in Normal limits  Chest:Chest symmetric Cardio vascular:  S1/S2, RRR, No murmure, No Rubs or Gallops  pulmonary: Clear to auscultation bilaterally, respirations  unlabored, negative wheezes / crackles Abdomen: Soft, non-tender, non-distended, bowel sounds,no masses, no organomegaly Muscular skeletal: Limited exam - in bed, able to move all 4 extremities, Normal strength,  Neuro: CNII-XII intact. , normal motor and sensation, reflexes intact  Extremities: No pitting edema lower extremities, +2 pulses  Skin: Dry, warm to touch, negative for any Rashes, No open wounds Wounds: per nursing documentation   LABs:  CBC Latest Ref Rng & Units 06/01/2019 05/31/2019 03/25/2019  WBC 4.0 - 10.5 K/uL 12.7(H) 9.6 -  Hemoglobin 13.0 - 17.0 g/dL 13.8 11.2(L) 16.7  Hematocrit 39.0 - 52.0 % 44.4 35.9(L) 49.0  Platelets 150 - 400 K/uL 224 73(L) -   CMP Latest Ref Rng & Units 06/03/2019 06/01/2019 05/31/2019  Glucose 70 - 99 mg/dL 125(H) 261(H) 144(H)  BUN 8 - 23 mg/dL 20 25(H) 23  Creatinine 0.61 - 1.24  mg/dL 0.88 1.46(H) 1.24  Sodium 135 - 145 mmol/L 142 137 139  Potassium 3.5 - 5.1 mmol/L 4.1 4.5 3.8  Chloride 98 - 111 mmol/L 95(L) 95(L) 95(L)  CO2 22 - 32 mmol/L 38(H) 31 32  Calcium 8.9 - 10.3 mg/dL 9.1 8.8(L) 9.0  Total Protein 6.5 - 8.1 g/dL - 7.2 7.6  Total Bilirubin 0.3 - 1.2 mg/dL - 0.3 0.5  Alkaline Phos 38 - 126 U/L - 59 62  AST 15 - 41 U/L - 17 16  ALT 0 - 44 U/L - 15 16        SIGNED: Deatra Fredrich, MD, FACP, FHM. [facility],  Pager [phone]  If 7PM-7AM, please contact night-coverage [email or web address] 06/03/2019, 12:19 PM

## 2019-06-04 DIAGNOSIS — Z23 Encounter for immunization: Secondary | ICD-10-CM | POA: Diagnosis not present

## 2019-06-04 MED ORDER — BUDESONIDE 0.5 MG/2ML IN SUSP: 0.5000 mg | Freq: Two times a day (BID) | RESPIRATORY_TRACT | 1 refills | Status: DC

## 2019-06-04 MED ORDER — LEVOFLOXACIN 750 MG PO TABS: 750.0000 mg | ORAL_TABLET | Freq: Every day | ORAL | 0 refills | Status: AC

## 2019-06-04 MED ORDER — LISINOPRIL 20 MG PO TABS: 5.0000 mg | ORAL_TABLET | Freq: Every day | ORAL | 1 refills | Status: DC

## 2019-06-04 MED ORDER — PREDNISONE 20 MG PO TABS: 20.0000 mg | ORAL_TABLET | Freq: Every day | ORAL | 0 refills | Status: AC

## 2019-06-04 MED ORDER — IPRATROPIUM-ALBUTEROL 0.5-2.5 (3) MG/3ML IN SOLN: 3.0000 mL | Freq: Four times a day (QID) | RESPIRATORY_TRACT | 3 refills | Status: DC

## 2019-06-04 MED ORDER — DM-GUAIFENESIN ER 30-600 MG PO TB12: 1.0000 | ORAL_TABLET | Freq: Two times a day (BID) | ORAL | 0 refills | Status: AC

## 2019-06-04 MED ORDER — PNEUMOCOCCAL VAC POLYVALENT 25 MCG/0.5ML IJ INJ
0.5000 mL | INJECTION | INTRAMUSCULAR | Status: AC
  Administered 2019-06-04: 0.5 mL via INTRAMUSCULAR

## 2019-06-04 NOTE — Progress Notes (Signed)
SATURATION QUALIFICATIONS: (This note is used to comply with regulatory documentation for home oxygen)  Patient Saturations on Room Air at Rest = 88%  Patient Saturations on Room Air while Ambulating = 87%  Patient Saturations on 2 Liters of oxygen while Ambulating = 90%  Please briefly explain why patient needs home oxygen:

## 2019-06-04 NOTE — TOC Transition Note (Signed)
Transition of Care [facility]) - CM/SW Discharge Note   Patient Details  Name: Clifford Jones MRN: [id number] Date of Birth: 11-09-1950  Transition of Care [facility]) CM/SW Contact:  Kameran Mcneese, Chauncey Reading, RN Phone Number: 06/04/2019, 11:02 AM   Clinical Narrative:   Patient discharging today. Qualifies for oxygen. No preferences on providers. Discussed home health with patient, patient lives in [place] but will be staying with a friend [name]) in [place]. Discussed options, deferring home health at this time, but both are aware PCP can order also.   Plan will be for [employer] to deliver O2 to room and patient will meet [employer] at his home in [place] to take home concentrator to his friends home in [place]. Both agreeable to this plan and aware that [employer] will only be able to service him in [place]. Patient only plans to live in [place] until he is feeling better.     Final next level of care: Home/Self Care Barriers to Discharge: Barriers Resolved   Patient Goals and CMS Choice     Choice offered to / list presented to : Patient     Discharge Plan and Services   Discharge Planning Services: CM Consult Post Acute Care Choice: Durable Medical Equipment          DME Arranged: Oxygen DME Agency: [employer] Date DME Agency Contacted: 06/04/19 Time DME Agency Contacted: 1054 Representative spoke with at DME Agency: [place] ([place]) Interventions     Readmission Risk Interventions No flowsheet data found.

## 2019-06-04 NOTE — Progress Notes (Signed)
Pt removed n/c and stated he did not want to wear O2 because he never wears it at home. Pt educated on importance of O2 and still refused. MD notified of pt removal of O2.

## 2019-06-04 NOTE — Discharge Summary (Signed)
Physician Discharge Summary [facility]    Patient: Clifford Jones                   Admit date: 05/31/2019   DOB: December 17, 1950             Discharge date:06/04/2019/10:04 AM [id number]                          PCP: Lavella Lemons, PA  Disposition: HOME  Recommendations for Outpatient Follow-up:   . Follow up: in 2 weeks  Discharge Condition: Stable   Code Status:   Code Status: Full Code  Diet recommendation: Cardiac diet   Discharge Diagnoses:    Active Problems:   CAP (community acquired pneumonia)   Acute hypoxemic respiratory failure ([place])   History of Present Illness/ Hospital Course Kathleen Argue Summary:   As per H&P written by Dr. Darrick Meigs on 06/01/2019 68 y.o.male,*with history of CAD s/p CABG in 2003, hypertension, hyperlipidemia, ampullary adenoma status post ampullectomy came to ED with chief complaints of fever, cough and shortness of breath for past 4 days. Patient denies chest pain. He has been coughing up yellow phlegm. Denies dysuria or abdominal pain. No weakness or numbness of upper or lower extremities. In the ED chest x-ray showed scattered pulmonary opacities bilaterally greatest in the right upper and right midlung zone. Findings concerning for multifocal pneumonia. Initial POC COVID-19 test is negative, SARS-CoV-2 RT-PCR was negative Patient is requiring6L/min of oxygen. He is not on oxygen at home. Patient started on ceftriaxone and Zithromax.  06/03/2019: Patient was seen and examined, still requiring up to 7 L of oxygen to maintain O2 sat greater than 90%, still experiencing excessive shortness of breath especially with exertion 06/04/2019 -she requested to be discharged home, he was taken off 7 L of oxygen, he maintained 88-92% with 2 L of oxygen, he was instructed to continue 2 L of oxygen at home at all time.  He is to finish antibiotic course, taper down steroids, continue prescribed inhalers. Strongly advised on smoking cessation,  follow with a PCP and pulmonologist.  Assessment & Plan:   Active Problems:   CAP (community acquired pneumonia)   Acute hypoxemic respiratory failure ([place])  Acute hypoxemic respiratory failure secondary to multifocalCAP (community acquired pneumonia) -Patient voluntary to oxygen off his face he was on 7 L, was satting about 87%, with 2 L he maintained approximately 90% -We have informed the patient to continue minimal 2 L of oxygen even at home with goal setting of 88-92%. -Improving shortness of breath -Continue incentive spirometer,  -Continue IV steroids with taper --to p.o. with taper -Cultures negative to date -SARS-CoV-2 negative on admission (no other signs of Covid-no need for repeated test)   COPD exacerbation  -Per patient he is not formally diagnosed and not O2 dependent -Confirms lifelong history of tobacco abuse, quit a month ago -Continue prescribed inhalers, including DuoNebs -started on mucinex and flutter valve -on  prednisone down next 3 days -wean off O 2 supplementation...  Was cut down to 2 L of oxygen via nasal cannula, to maintain O2 sat 88-92% Recommended to continue to liters of oxygen at all time He is to follow-up with his PCP and pulmonology for further evaluation recommendations  Tobacco abuse -Confirms lifelong history of tobacco abuse, quit a month ago -cessation counseling provided. -patient decline nicotine patch   Acute renal insufficiency -In the setting of hypertension, and nephrotoxins -Lisinopril was on hold, may  resume a small dose of lisinopril -Improved BUN/creatinine -Avoiding nephrotoxins -Improved  Hyperlipidemia -Continue statins  Coronary artery disease - Continue current home meds aspirin, statins, beta-blocker, Ranexa  Cultures; 05/31/2019 blood cultures x2-negative to date 06/01/2019 urine culture-negative to date 06/01/2019 SARS-CoV-2 negative  Antimicrobials: 05/31/2019-azithromycin >>  06/04/2019 06/01/2019 Rocephin >> 06/04/2019 06/05/2019 Levaquin 750 mg x 3 more days     Code Status:   Code Status: Full Code Disposition Plan:   Home with home health, O2 for home use Admission status: NPATIEN -due to persistent hypoxia, respiratory failure, COPD exacerbation,    Discharge Instructions:   Discharge Instructions    Activity as tolerated - No restrictions   Complete by: As directed    Call MD for:  difficulty breathing, headache or visual disturbances   Complete by: As directed    Call MD for:  hives   Complete by: As directed    Call MD for:  temperature >100.4   Complete by: As directed    Diet - low sodium heart healthy   Complete by: As directed    Discharge instructions   Complete by: As directed    Please continue current recommended medications, abstain from smoking cigarettes, continue inhalers. Follow with your PCP and pulmonologist within 1 to 2 weeks. Continue using a flutter valve and incentive spirometer as directed.   Increase activity slowly   Complete by: As directed        Medication List    STOP taking these medications   amLODipine 5 MG tablet Commonly known as: NORVASC     TAKE these medications   acetaminophen 500 MG tablet Commonly known as: TYLENOL Take 1,000 mg by mouth every 6 (six) hours as needed for moderate pain or headache.   aspirin 81 MG EC tablet Take 1 tablet (81 mg total) by mouth daily.   budesonide 0.5 MG/2ML nebulizer solution Commonly known as: PULMICORT Take 2 mLs (0.5 mg total) by nebulization 2 (two) times daily.   carvedilol 6.25 MG tablet Commonly known as: COREG TAKE ONE TABLET BY MOUTH TWICE DAILY   dextromethorphan-guaiFENesin 30-600 MG 12hr tablet Commonly known as: MUCINEX DM Take 1 tablet by mouth 2 (two) times daily for 5 days.   furosemide 40 MG tablet Commonly known as: Lasix Take 1 tablet (40 mg total) by mouth daily.   ipratropium-albuterol 0.5-2.5 (3) MG/3ML Soln Commonly  known as: DUONEB Inhale 3 mLs into the lungs every 6 (six) hours. What changed:   when to take this  reasons to take this   levofloxacin 750 MG tablet Commonly known as: Levaquin Take 1 tablet (750 mg total) by mouth daily for 3 days. Start taking on: June 05, 2019   lisinopril 20 MG tablet Commonly known as: ZESTRIL Take 0.5 tablets (10 mg total) by mouth daily. What changed: how much to take   nitroGLYCERIN 0.4 MG SL tablet Commonly known as: NITROSTAT Place 1 tablet (0.4 mg total) under the tongue every 5 (five) minutes x 3 doses as needed for chest pain.   pantoprazole 40 MG tablet Commonly known as: PROTONIX TAKE 1 TABLET BY MOUTH TWICE DAILY   predniSONE 20 MG tablet Commonly known as: DELTASONE Take 1 tablet (20 mg total) by mouth daily with breakfast for 3 days. Start taking on: June 05, 2019   Ranexa 1000 MG SR tablet Generic drug: ranolazine TAKE ONE TABLET BY MOUTH TWICE DAILY What changed:   how much to take  when to take this   rosuvastatin 40  MG tablet Commonly known as: CRESTOR TAKE ONE TABLET BY MOUTH EVERY DAY       No Known Allergies   Procedures /Studies:   DG Chest Port 1 View  Result Date: 05/31/2019 CLINICAL DATA:  Sepsis EXAM: PORTABLE CHEST 1 VIEW COMPARISON:  04/07/2016 FINDINGS: The heart size is significantly enlarged. The patient is status post prior median sternotomy. Scattered pulmonary opacities are noted bilaterally but greatest in the right upper and right mid lung zones. The lungs are hyperexpanded. There is no significant pleural effusion. There is no pneumothorax. IMPRESSION: 1. Scattered pulmonary opacities bilaterally, greatest in the right upper and right mid lung zones. Findings are concerning for multifocal pneumonia (viral or bacterial). 2. Cardiomegaly. Electronically Signed   By: Constance Holster M.D.   On: 05/31/2019 23:43    Subjective:   Patient was seen and examined 06/04/2019, 10:04 AM Patient  stable today. No acute distress.  No issues overnight Stable for discharge.  Discharge Exam:    Vitals:   06/03/19 2049 06/04/19 0118 06/04/19 0617 06/04/19 0700  BP: (!) 143/84  (!) 159/82   Pulse: 66  63   Resp: 18  20   Temp: 98.2 F (36.8 C)  97.8 F (36.6 C)   TempSrc: Oral  Oral   SpO2: 94% 92% 95% 95%  Weight:      Height:        General: Pt lying comfortably in bed & appears in no obvious distress. Cardiovascular: S1 & S2 heard, RRR, S1/S2 +. No murmurs, rubs, gallops or clicks. No JVD or pedal edema. Respiratory: Clear to auscultation without wheezing, rhonchi or crackles. No increased work of breathing. Abdominal:  Non-distended, non-tender & soft. No organomegaly or masses appreciated. Normal bowel sounds heard. CNS: Alert and oriented. No focal deficits. Extremities: no edema, no cyanosis    The results of significant diagnostics from this hospitalization (including imaging, microbiology, ancillary and laboratory) are listed below for reference.      Microbiology:   Recent Results (from the past 240 hour(s))  Blood Culture (routine x 2)     Status: None (Preliminary result)   Collection Time: 05/31/19 11:36 PM   Specimen: BLOOD RIGHT HAND  Result Value Ref Range Status   Specimen Description BLOOD RIGHT HAND  Final   Special Requests NONE  Final   Culture   Final    NO GROWTH 4 DAYS Performed at [facility], [address] [place], [place] [address]    Report Status PENDING  Incomplete  Blood Culture (routine x 2)     Status: None (Preliminary result)   Collection Time: 05/31/19 11:41 PM   Specimen: BLOOD LEFT HAND  Result Value Ref Range Status   Specimen Description BLOOD LEFT HAND  Final   Special Requests NONE  Final   Culture   Final    NO GROWTH 4 DAYS Performed at [facility], [address] [place], [place] [address]    Report Status PENDING  Incomplete  Urine culture     Status: None   Collection Time: 06/01/19 12:58 AM   Specimen:  In/Out Cath Urine  Result Value Ref Range Status   Specimen Description   Final    IN/OUT CATH URINE Performed at [facility], [address], [place], [place] [address]    Special Requests   Final    NONE Performed at [facility], [address], [place], [place] [address]    Culture   Final    NO GROWTH Performed  at [facility], [address], [place], [place] [address]    Report Status 06/02/2019 FINAL  Final  SARS Coronavirus 2 by RT PCR (hospital order, performed in [facility] hospital lab)     Status: None   Collection Time: 06/01/19  1:14 AM  Result Value Ref Range Status   SARS Coronavirus 2 NEGATIVE NEGATIVE Final    Comment: (NOTE) SARS-CoV-2 target nucleic acids are NOT DETECTED. The SARS-CoV-2 RNA is generally detectable in upper and lower respiratory specimens during the acute phase of infection. The lowest concentration of SARS-CoV-2 viral copies this assay can detect is 250 copies / mL. A negative result does not preclude SARS-CoV-2 infection and should not be used as the sole basis for treatment or other patient management decisions.  A negative result may occur with improper specimen collection / handling, submission of specimen other than nasopharyngeal swab, presence of viral mutation(s) within the areas targeted by this assay, and inadequate number of viral copies (<250 copies / mL). A negative result must be combined with clinical observations, patient history, and epidemiological information. Fact Sheet for Patients:   StrictlyIdeas.no Fact Sheet for Healthcare Providers: BankingDealers.co.za This test is not yet approved or cleared  by the [place] FDA and has been authorized for detection and/or diagnosis of SARS-CoV-2 by FDA under an Emergency Use Authorization (EUA).  This EUA will remain in effect (meaning this test can be used) for the duration of the COVID-19 declaration under Section  564(b)(1) of the Act, 21 U.S.C. section 360bbb-3(b)(1), unless the authorization is terminated or revoked sooner. Performed at [facility], [address], [place], [place] [address]      Labs:   CBC: Recent Labs  Lab 05/31/19 2318 06/01/19 0510  WBC 9.6 12.7*  NEUTROABS 6.0  --   HGB 11.2* 13.8  HCT 35.9* 44.4  MCV 101.7* 101.8*  PLT 73* [id number]   Basic Metabolic Panel: Recent Labs  Lab 05/31/19 2318 06/01/19 0510 06/03/19 0604  NA 139 137 142  K 3.8 4.5 4.1  CL 95* 95* 95*  CO2 32 31 38*  GLUCOSE 144* 261* 125*  BUN 23 25* 20  CREATININE 1.24 1.46* 0.88  CALCIUM 9.0 8.8* 9.1   Liver Function Tests: Recent Labs  Lab 05/31/19 2318 06/01/19 0510  AST 16 17  ALT 16 15  ALKPHOS 62 59  BILITOT 0.5 0.3  PROT 7.6 7.2  ALBUMIN 3.7 3.4*   BNP (last 3 results) Recent Labs    05/31/19 2318  BNP 248.0*   CarUrinalysis    Component Value Date/Time   COLORURINE AMBER (A) 06/01/2019 0058   APPEARANCEUR CLEAR 06/01/2019 0058   LABSPEC 1.024 06/01/2019 0058   PHURINE 5.0 06/01/2019 0058   GLUCOSEU NEGATIVE 06/01/2019 0058   HGBUR NEGATIVE 06/01/2019 0058   BILIRUBINUR SMALL (A) 06/01/2019 0058   KETONESUR NEGATIVE 06/01/2019 0058   PROTEINUR 30 (A) 06/01/2019 0058   NITRITE NEGATIVE 06/01/2019 0058   LEUKOCYTESUR NEGATIVE 06/01/2019 0058         Time coordinating discharge: Over 45 minutes  SIGNED: Deatra Tammie, MD, FACP, FHM. [facility],  Pager [phone]  If 7PM-7AM, please contact night-coverage [email or web address], Password [facility] 06/04/2019, 10:04 AM

## 2019-06-06 LAB — CULTURE, BLOOD (ROUTINE X 2)
Culture: NO GROWTH
Culture: NO GROWTH
Special Requests: ADEQUATE

## 2019-07-04 DIAGNOSIS — J449 Chronic obstructive pulmonary disease, unspecified: Secondary | ICD-10-CM | POA: Diagnosis not present

## 2019-07-04 DIAGNOSIS — I10 Essential (primary) hypertension: Secondary | ICD-10-CM | POA: Diagnosis not present

## 2019-07-04 DIAGNOSIS — F17210 Nicotine dependence, cigarettes, uncomplicated: Secondary | ICD-10-CM | POA: Diagnosis not present

## 2019-07-04 DIAGNOSIS — Z955 Presence of coronary angioplasty implant and graft: Secondary | ICD-10-CM | POA: Diagnosis not present

## 2019-07-05 DIAGNOSIS — J449 Chronic obstructive pulmonary disease, unspecified: Secondary | ICD-10-CM | POA: Diagnosis not present

## 2019-07-05 DIAGNOSIS — I5040 Unspecified combined systolic (congestive) and diastolic (congestive) heart failure: Secondary | ICD-10-CM | POA: Diagnosis not present

## 2019-08-05 DIAGNOSIS — I5040 Unspecified combined systolic (congestive) and diastolic (congestive) heart failure: Secondary | ICD-10-CM | POA: Diagnosis not present

## 2019-08-05 DIAGNOSIS — J449 Chronic obstructive pulmonary disease, unspecified: Secondary | ICD-10-CM | POA: Diagnosis not present

## 2019-08-13 DIAGNOSIS — J449 Chronic obstructive pulmonary disease, unspecified: Secondary | ICD-10-CM | POA: Diagnosis not present

## 2019-08-13 DIAGNOSIS — R141 Gas pain: Secondary | ICD-10-CM | POA: Diagnosis not present

## 2019-08-13 DIAGNOSIS — I10 Essential (primary) hypertension: Secondary | ICD-10-CM | POA: Diagnosis not present

## 2019-08-13 DIAGNOSIS — R140 Abdominal distension (gaseous): Secondary | ICD-10-CM | POA: Diagnosis not present

## 2019-08-13 DIAGNOSIS — Z6833 Body mass index (BMI) 33.0-33.9, adult: Secondary | ICD-10-CM | POA: Diagnosis not present

## 2019-08-29 ENCOUNTER — Telehealth: Payer: Self-pay

## 2019-08-29 NOTE — Telephone Encounter (Signed)

## 2019-09-02 DIAGNOSIS — J449 Chronic obstructive pulmonary disease, unspecified: Secondary | ICD-10-CM | POA: Diagnosis not present

## 2019-09-02 DIAGNOSIS — I5040 Unspecified combined systolic (congestive) and diastolic (congestive) heart failure: Secondary | ICD-10-CM | POA: Diagnosis not present

## 2019-09-03 ENCOUNTER — Encounter: Payer: Self-pay | Admitting: Cardiovascular Disease

## 2019-09-03 ENCOUNTER — Telehealth (INDEPENDENT_AMBULATORY_CARE_PROVIDER_SITE_OTHER): Payer: Medicare HMO | Admitting: Cardiovascular Disease

## 2019-09-03 VITALS — BP 137/73 | HR 77 | Ht 72.0 in | Wt 235.0 lb

## 2019-09-03 DIAGNOSIS — I25708 Atherosclerosis of coronary artery bypass graft(s), unspecified, with other forms of angina pectoris: Secondary | ICD-10-CM

## 2019-09-03 DIAGNOSIS — I5032 Chronic diastolic (congestive) heart failure: Secondary | ICD-10-CM

## 2019-09-03 DIAGNOSIS — E785 Hyperlipidemia, unspecified: Secondary | ICD-10-CM

## 2019-09-03 DIAGNOSIS — I10 Essential (primary) hypertension: Secondary | ICD-10-CM

## 2019-09-03 MED ORDER — AMLODIPINE BESYLATE 2.5 MG PO TABS: 2.5000 mg | ORAL_TABLET | Freq: Every day | ORAL | 3 refills | Status: DC

## 2019-09-03 NOTE — Addendum Note (Signed)
Addended by: Barbarann Ehlers A on: 09/03/2019 10:26 AM   Modules accepted: Orders

## 2019-09-03 NOTE — Patient Instructions (Signed)
Medication Instructions:  START Amlodipine 2.5 mg daily    *If you need a refill on your cardiac medications before your next appointment, please call your pharmacy*   Lab Work: None today If you have labs (blood work) drawn today and your tests are completely normal, you will receive your results only by: Marland Kitchen MyChart Message (if you have MyChart) OR . A paper copy in the mail If you have any lab test that is abnormal or we need to change your treatment, we will call you to review the results.   Testing/Procedures: None today   Follow-Up: At [facility], you and your health needs are our priority.  As part of our continuing mission to provide you with exceptional heart care, we have created designated Provider Care Teams.  These Care Teams include your primary Cardiologist (physician) and Advanced Practice Providers (APPs -  Physician Assistants and Nurse Practitioners) who all work together to provide you with the care you need, when you need it.  We recommend signing up for the patient portal called "MyChart".  Sign up information is provided on this After Visit Summary.  MyChart is used to connect with patients for Virtual Visits (Telemedicine).  Patients are able to view lab/test results, encounter notes, upcoming appointments, etc.  Non-urgent messages can be sent to your provider as well.   To learn more about what you can do with MyChart, go to [email or web address].    Your next appointment:   6 month(s)  The format for your next appointment:   In Person  Provider:   Kate Sable, MD   Other Instructions None      Thank you for choosing [facility] !

## 2019-09-03 NOTE — Progress Notes (Signed)
Virtual Visit via Telephone Note   This visit type was conducted due to national recommendations for restrictions regarding the COVID-19 Pandemic (e.g. social distancing) in an effort to limit this patient's exposure and mitigate transmission in our community.  Due to his co-morbid illnesses, this patient is at least at moderate risk for complications without adequate follow up.  This format is felt to be most appropriate for this patient at this time.  The patient did not have access to video technology/had technical difficulties with video requiring transitioning to audio format only (telephone).  All issues noted in this document were discussed and addressed.  No physical exam could be performed with this format.  Please refer to the patient's chart for his  consent to telehealth for [facility].   The patient was identified using 2 identifiers.  Date:  09/03/2019   ID:  Clifford Jones, DOB [birth year], MRN [id number]  Patient Location: Home Provider Location: Office  PCP:  Lavella Lemons, PA  Cardiologist:  Kate Sable, MD  Electrophysiologist:  None   Evaluation Performed:  Follow-Up Visit  Chief Complaint:  CAD  History of Present Illness:    Clifford Jones is a 69 y.o. male with past medical history of CAD (s/p CABG in 2003 with NSTEMI in 2008 and DES to [place], NSTEMI in 2013 with DES to OM, and NSTEMI in 2015 with DES to [place]), HTN, HLD, and a prior history of tobacco use.  I have not evaluated the patient since May 2017.  He was last seen on 02/20/2019 by B.  Strader PA-C in our office.  He was hospitalized for community-acquired pneumonia and COPD exacerbation in December 2020.  He has chest pain every 3 to 4 days but said it is mild and rates it at 2/10.  He takes 1 sublingual nitroglycerin and this alleviates his symptoms.  We spoke about potentially starting long-acting nitrates.  He had been on amlodipine and a higher dose of lisinopril before being hospitalized with  community-acquired pneumonia in December 2020.  He requests amlodipine again.  He takes Lasix 40 mg daily and when his legs are slightly more swollen he takes an extra 40 mg.  He has not smoked cigarettes since May 30, 2019.   Past Medical History:  Diagnosis Date  . Ampullary adenoma   . Arthritis    Reported by patient  . Cancer [facility])    Reported by patient  . Claustrophobia   . Closed fracture of right distal tibia   . COPD (chronic obstructive pulmonary disease) ([place])    Reported by patient  . Coronary artery disease    a. 1999 Inf MI;  b. 2003 CABGx6;  c. 2008 NSTEMI, occluded SVG to marginal and diagonal system. 2 DES placed to marginal system;  c. NSTEMI - 02/2012 Xience Xpedition DES to oS-OM and POBA to mid lesion;  c. 10/2013 NSTEMI/PCI: VG->OM 80 ISR, native OM 80/90 small, LIMA->LAD ok w/ 90 dLAD, D1 80, RIMA->RPL ok w/ 95 in RPL (2.5x14 Resolute DES), nl EF.  Marland Kitchen Dyspnea    Reported by patient  . GERD (gastroesophageal reflux disease)   . Hyperlipidemia   . Hypertension   . Myocardial infarction ([place])    X 6  . Pneumonia   . Presence of permanent cardiac pacemaker   . S/P AAA repair   . Tobacco abuse   . Wears dentures   . Wears glasses    Past Surgical History:  Procedure Laterality Date  .  ABDOMINAL AORTIC ANEURYSM REPAIR  2006  . APPENDECTOMY    . CORONARY ANGIOPLASTY WITH STENT PLACEMENT    . CORONARY ARTERY BYPASS GRAFT  2003  . ENDOSCOPIC RETROGRADE CHOLANGIOPANCREATOGRAPHY (ERCP) WITH PROPOFOL N/A 03/25/2019   Procedure: ENDOSCOPIC RETROGRADE CHOLANGIOPANCREATOGRAPHY (ERCP) WITH PROPOFOL;  Surgeon: Rush Landmark Telford Nab., MD;  Location: [place];  Service: Gastroenterology;  Laterality: N/A;  . ESOPHAGEAL DILATION  07/06/2018   Procedure: ESOPHAGEAL DILATION;  Surgeon: Rogene Houston, MD;  [facility];  Service: Endoscopy;;  . ESOPHAGOGASTRODUODENOSCOPY N/A 07/06/2018   Procedure: ESOPHAGOGASTRODUODENOSCOPY (EGD);  Surgeon: Rogene Houston, MD;  [facility];  Service: Endoscopy;  Laterality: N/A;  . ESOPHAGOGASTRODUODENOSCOPY N/A 08/16/2018   Procedure: ESOPHAGOGASTRODUODENOSCOPY (EGD);  Surgeon: Milus Banister, MD;  Location[facility];  Service: Endoscopy;  Laterality: N/A;  . ESOPHAGOGASTRODUODENOSCOPY (EGD) WITH PROPOFOL N/A 03/25/2019   Procedure: ESOPHAGOGASTRODUODENOSCOPY (EGD) WITH PROPOFOL;  Surgeon: Rush Landmark Telford Nab., MD;  Location: [place];  Service: Gastroenterology;  Laterality: N/A;  . EUS N/A 08/16/2018   Procedure: UPPER ENDOSCOPIC ULTRASOUND (EUS) RADIAL;  Surgeon: Milus Banister, MD;  Location: [facility];  Service: Endoscopy;  Laterality: N/A;  . EUS N/A 03/25/2019   Procedure: UPPER ENDOSCOPIC ULTRASOUND (EUS) RADIAL;  Surgeon: Rush Landmark Telford Nab., MD;  Location: [place];  Service: Gastroenterology;  Laterality: N/A;  . FOREIGN BODY REMOVAL  07/06/2018   Procedure: FOREIGN BODY REMOVAL;  Surgeon: Rogene Houston, MD;  [facility];  Service: Endoscopy;;  . HERNIA REPAIR     umbicical  . LEFT HEART CATHETERIZATION WITH CORONARY ANGIOGRAM N/A 03/05/2012   Procedure: LEFT HEART CATHETERIZATION WITH CORONARY ANGIOGRAM;  Surgeon: Hillary Bow, MD;  Location[facility] CATH LAB;  Service: Cardiovascular;  Laterality: N/A;  . LEFT HEART CATHETERIZATION WITH CORONARY ANGIOGRAM N/A 10/23/2013   Procedure: LEFT HEART CATHETERIZATION WITH CORONARY ANGIOGRAM;  Surgeon: Blane Ohara, MD;  Location: [facility] CATH LAB;  Service: Cardiovascular;  Laterality: N/A;  . LEFT HEART CATHETERIZATION WITH CORONARY/GRAFT ANGIOGRAM  10/22/2013   Procedure: LEFT HEART CATHETERIZATION WITH Beatrix Fetters;  Surgeon: Leonie Man, MD;  Location: [facility] CATH LAB;  Service: Cardiovascular;;  . PANCREATIC STENT PLACEMENT  03/25/2019   Procedure: PANCREATIC STENT PLACEMENT;  Surgeon: Rush Landmark Telford Nab., MD;  Location: [place];  Service: Gastroenterology;;  . PERCUTANEOUS CORONARY  STENT INTERVENTION (PCI-S) N/A 03/07/2012   Procedure: PERCUTANEOUS CORONARY STENT INTERVENTION (PCI-S);  Surgeon: Sherren Mocha, MD;  Location: [facility] CATH LAB;  Service: Cardiovascular;  Laterality: N/A;  . PERCUTANEOUS CORONARY STENT INTERVENTION (PCI-S) Right 10/23/2013   Procedure: PERCUTANEOUS CORONARY STENT INTERVENTION (PCI-S);  Surgeon: Blane Ohara, MD;  Location: [facility] CATH LAB;  Service: Cardiovascular;  Laterality: Right;  . POLYPECTOMY  07/06/2018   Procedure: POLYPECTOMY;  Surgeon: Rogene Houston, MD;  [facility];  Service: Endoscopy;;  polyp at ampulla  . POLYPECTOMY  03/25/2019   Procedure: AMPULLECTOMY;  Surgeon: Mansouraty, Telford Nab., MD;  Location: [place];  Service: Gastroenterology;;  . Right hand surgery    . SPHINCTEROTOMY  03/25/2019   Procedure: SPHINCTEROTOMY;  Surgeon: Mansouraty, Telford Nab., MD;  Location: [place];  Service: Gastroenterology;;  . TIBIA IM NAIL INSERTION Right 04/07/2016   Procedure: FIXATION OF RIGHT TIBIA FRACTURE;  Surgeon: Garald Balding, MD;  Location: [place];  Service: Orthopedics;  Laterality: Right;     Current Meds  Medication Sig  . acetaminophen (TYLENOL) 500 MG tablet Take 1,000 mg by mouth every 6 (six)  hours as needed for moderate pain or headache.  Marland Kitchen aspirin 81 MG EC tablet Take 1 tablet (81 mg total) by mouth daily.  . budesonide (PULMICORT) 0.5 MG/2ML nebulizer solution Take 2 mLs (0.5 mg total) by nebulization 2 (two) times daily.  . carvedilol (COREG) 6.25 MG tablet TAKE ONE TABLET BY MOUTH TWICE DAILY (Patient taking differently: Take 6.25 mg by mouth 2 (two) times daily. )  . furosemide (LASIX) 40 MG tablet Take 1 tablet (40 mg total) by mouth daily.  Marland Kitchen ipratropium-albuterol (DUONEB) 0.5-2.5 (3) MG/3ML SOLN Inhale 3 mLs into the lungs every 6 (six) hours.  Marland Kitchen lisinopril (ZESTRIL) 20 MG tablet Take 0.5 tablets (10 mg total) by mouth daily.  . nitroGLYCERIN (NITROSTAT) 0.4 MG SL tablet Place 1 tablet (0.4 mg  total) under the tongue every 5 (five) minutes x 3 doses as needed for chest pain.  . pantoprazole (PROTONIX) 40 MG tablet TAKE 1 TABLET BY MOUTH TWICE DAILY (Patient taking differently: Take 40 mg by mouth 2 (two) times daily. )  . RANEXA 1000 MG SR tablet TAKE ONE TABLET BY MOUTH TWICE DAILY (Patient taking differently: Take 1,000 mg by mouth 2 (two) times daily with a meal. )  . rosuvastatin (CRESTOR) 40 MG tablet TAKE ONE TABLET BY MOUTH EVERY DAY (Patient taking differently: Take 40 mg by mouth daily. )     Allergies:   Patient has no known allergies.   Social History   Tobacco Use  . Smoking status: Former Smoker    Packs/day: 1.00    Years: 54.00    Pack years: 54.00    Types: Cigarettes    Start date: 10/04/1964    Quit date: 05/30/2019    Years since quitting: 0.2  . Smokeless tobacco: Never Used  Substance Use Topics  . Alcohol use: Yes    Comment: Maybe 2 beers a month and 2 shotsa month  . Drug use: No     Family Hx: The patient's family history includes Coronary artery disease in an other family member. There is no history of Colon cancer, Esophageal cancer, Inflammatory bowel disease, Liver disease, Pancreatic cancer, Rectal cancer, or Stomach cancer.  ROS:   Please see the history of present illness.     All other systems reviewed and are negative.   Prior CV studies:   The following studies were reviewed today:  Echocardiogram: 01/30/2019 IMPRESSIONS  1. The left ventricle has normal systolic function, with an ejection fraction of 55-60%. The cavity size was normal. Mild basal septal hypertrophy. Diastolic dysfunction, grade indeterminate. Indeterminate filling pressures No evidence of left  ventricular regional wall motion abnormalities. 2. The mitral valve is grossly normal. 3. The tricuspid valve is grossly normal. 4. The aortic valve is tricuspid. Mild thickening of the aortic valve. Sclerosis without any evidence of stenosis of the aortic  valve. 5. The aorta is normal unless otherwise noted. 6. The inferior vena cava was dilated in size with >50% respiratory variability. 7. The interatrial septum appears to be lipomatous.   NST 03/02/15:   There was no ST segment deviation noted during stress.  T wave inversion of 1 mm was noted during stress in the I, II, aVF, V5 and V6 leads. T wave inversion persisted.  Defect 1: There is a medium defect of severe severity present in the apical anterior, apical inferior and apex location.  Findings consistent with prior myocardial infarction. There is no evidence of ischemia.  This is an intermediate risk study.  Nuclear stress  EF: 39%.     Labs/Other Tests and Data Reviewed:    EKG:  No ECG reviewed.  Recent Labs: 05/31/2019: B Natriuretic Peptide 248.0 06/01/2019: ALT 15; Hemoglobin 13.8; Platelets 224 06/03/2019: BUN 20; Creatinine, Ser 0.88; Potassium 4.1; Sodium 142   Recent Lipid Panel Lab Results  Component Value Date/Time   CHOL 161 03/02/2015 04:50 AM   TRIG 294 (H) 03/02/2015 04:50 AM   HDL 33 (L) 03/02/2015 04:50 AM   CHOLHDL 4.9 03/02/2015 04:50 AM   LDLCALC 69 03/02/2015 04:50 AM    Wt Readings from Last 3 Encounters:  09/03/19 235 lb (106.6 kg)  06/01/19 225 lb 15.5 oz (102.5 kg)  03/25/19 215 lb (97.5 kg)     Objective:    Vital Signs:  BP 137/73   Pulse 77   Ht 6' (1.829 m)   Wt 235 lb (106.6 kg)   BMI 31.87 kg/m    VITAL SIGNS:  reviewed  ASSESSMENT & PLAN:    1.  Coronary disease: Status post CABG in 2003 with NSTEMI in 2008 and DES to [place], NSTEMI in 2013 with DES to OM, and NSTEMI in 2015 with DES to [place].  Symptomatically stable.  Continue medical therapy with aspirin, carvedilol, ranolazine, and rosuvastatin.  We spoke about the addition of long-acting nitrates but he requests amlodipine as this has helped in the past.  I will start a low-dose of 2.5 mg daily.   2.  Hypertension: Blood pressure is controlled on present therapy.   I will monitor given the addition of a low-dose of amlodipine 2.5 mg daily.  3.  Chronic diastolic heart failure: No signs of hypervolemia.  Continue Lasix 40 mg daily.  He has been instructed to take an extra 40 mg should weight increased by 3 pounds in 24 hours or 5 pounds in a week.  4.  Hyperlipidemia: Continue rosuvastatin 40 mg daily.    COVID-19 Education: The signs and symptoms of COVID-19 were discussed with the patient and how to seek care for testing (follow up with PCP or arrange E-visit).  The importance of social distancing was discussed today.  Time:   Today, I have spent 25 minutes with the patient with telehealth technology discussing the above problems.     Medication Adjustments/Labs and Tests Ordered: Current medicines are reviewed at length with the patient today.  Concerns regarding medicines are outlined above.   Tests Ordered: No orders of the defined types were placed in this encounter.   Medication Changes: No orders of the defined types were placed in this encounter.   Follow Up:  In Person in 6 month(s)  Signed, Kate Sable, MD  09/03/2019 9:59 AM    [place]

## 2019-10-03 DIAGNOSIS — J449 Chronic obstructive pulmonary disease, unspecified: Secondary | ICD-10-CM | POA: Diagnosis not present

## 2019-10-03 DIAGNOSIS — I5040 Unspecified combined systolic (congestive) and diastolic (congestive) heart failure: Secondary | ICD-10-CM | POA: Diagnosis not present

## 2019-10-15 ENCOUNTER — Other Ambulatory Visit: Payer: Self-pay

## 2019-10-15 MED ORDER — AMLODIPINE BESYLATE 2.5 MG PO TABS: 2.5000 mg | ORAL_TABLET | Freq: Every day | ORAL | 3 refills | Status: DC

## 2019-10-15 NOTE — Telephone Encounter (Signed)
Refilled amlodipine 

## 2019-10-23 ENCOUNTER — Other Ambulatory Visit: Payer: Self-pay | Admitting: *Deleted

## 2019-10-23 MED ORDER — AMLODIPINE BESYLATE 2.5 MG PO TABS: 2.5000 mg | ORAL_TABLET | Freq: Every day | ORAL | 3 refills | Status: DC

## 2019-11-02 DIAGNOSIS — I5040 Unspecified combined systolic (congestive) and diastolic (congestive) heart failure: Secondary | ICD-10-CM | POA: Diagnosis not present

## 2019-11-02 DIAGNOSIS — J449 Chronic obstructive pulmonary disease, unspecified: Secondary | ICD-10-CM | POA: Diagnosis not present

## 2019-11-18 DIAGNOSIS — Z720 Tobacco use: Secondary | ICD-10-CM | POA: Diagnosis not present

## 2019-11-18 DIAGNOSIS — J441 Chronic obstructive pulmonary disease with (acute) exacerbation: Secondary | ICD-10-CM | POA: Diagnosis not present

## 2019-11-18 DIAGNOSIS — I10 Essential (primary) hypertension: Secondary | ICD-10-CM | POA: Diagnosis not present

## 2019-12-03 DIAGNOSIS — I5040 Unspecified combined systolic (congestive) and diastolic (congestive) heart failure: Secondary | ICD-10-CM | POA: Diagnosis not present

## 2019-12-03 DIAGNOSIS — J449 Chronic obstructive pulmonary disease, unspecified: Secondary | ICD-10-CM | POA: Diagnosis not present

## 2019-12-09 DIAGNOSIS — J0101 Acute recurrent maxillary sinusitis: Secondary | ICD-10-CM | POA: Diagnosis not present

## 2019-12-13 DIAGNOSIS — Z720 Tobacco use: Secondary | ICD-10-CM | POA: Diagnosis not present

## 2019-12-13 DIAGNOSIS — I214 Non-ST elevation (NSTEMI) myocardial infarction: Secondary | ICD-10-CM | POA: Diagnosis not present

## 2019-12-13 DIAGNOSIS — F17210 Nicotine dependence, cigarettes, uncomplicated: Secondary | ICD-10-CM | POA: Diagnosis not present

## 2019-12-13 DIAGNOSIS — U071 COVID-19: Secondary | ICD-10-CM | POA: Diagnosis not present

## 2019-12-13 DIAGNOSIS — E7800 Pure hypercholesterolemia, unspecified: Secondary | ICD-10-CM | POA: Diagnosis not present

## 2019-12-13 DIAGNOSIS — R000 Tachycardia, unspecified: Secondary | ICD-10-CM | POA: Diagnosis not present

## 2019-12-13 DIAGNOSIS — Z955 Presence of coronary angioplasty implant and graft: Secondary | ICD-10-CM | POA: Diagnosis not present

## 2019-12-13 DIAGNOSIS — J811 Chronic pulmonary edema: Secondary | ICD-10-CM | POA: Diagnosis not present

## 2019-12-13 DIAGNOSIS — I2510 Atherosclerotic heart disease of native coronary artery without angina pectoris: Secondary | ICD-10-CM | POA: Diagnosis not present

## 2019-12-13 DIAGNOSIS — I10 Essential (primary) hypertension: Secondary | ICD-10-CM | POA: Diagnosis not present

## 2019-12-14 ENCOUNTER — Inpatient Hospital Stay: Admit: 2019-12-14 | Payer: Medicare HMO | Admitting: Internal Medicine

## 2019-12-14 ENCOUNTER — Inpatient Hospital Stay (HOSPITAL_COMMUNITY)
Admission: AD | Admit: 2019-12-14 | Discharge: 2019-12-17 | DRG: 281 | Disposition: A | Discharge status: Home / Self Care | Payer: Medicare HMO | Source: Other Acute Inpatient Hospital | Attending: Cardiology | Admitting: Cardiology

## 2019-12-14 DIAGNOSIS — U071 COVID-19: Secondary | ICD-10-CM | POA: Diagnosis not present

## 2019-12-14 DIAGNOSIS — F17210 Nicotine dependence, cigarettes, uncomplicated: Secondary | ICD-10-CM | POA: Diagnosis not present

## 2019-12-14 DIAGNOSIS — Z8249 Family history of ischemic heart disease and other diseases of the circulatory system: Secondary | ICD-10-CM

## 2019-12-14 DIAGNOSIS — I5032 Chronic diastolic (congestive) heart failure: Secondary | ICD-10-CM | POA: Diagnosis not present

## 2019-12-14 DIAGNOSIS — Z955 Presence of coronary angioplasty implant and graft: Secondary | ICD-10-CM

## 2019-12-14 DIAGNOSIS — Z951 Presence of aortocoronary bypass graft: Secondary | ICD-10-CM | POA: Diagnosis not present

## 2019-12-14 DIAGNOSIS — I25119 Atherosclerotic heart disease of native coronary artery with unspecified angina pectoris: Secondary | ICD-10-CM | POA: Diagnosis present

## 2019-12-14 DIAGNOSIS — Z7982 Long term (current) use of aspirin: Secondary | ICD-10-CM | POA: Diagnosis not present

## 2019-12-14 DIAGNOSIS — I10 Essential (primary) hypertension: Secondary | ICD-10-CM | POA: Diagnosis not present

## 2019-12-14 DIAGNOSIS — Z87891 Personal history of nicotine dependence: Secondary | ICD-10-CM

## 2019-12-14 DIAGNOSIS — J449 Chronic obstructive pulmonary disease, unspecified: Secondary | ICD-10-CM | POA: Diagnosis present

## 2019-12-14 DIAGNOSIS — I513 Intracardiac thrombosis, not elsewhere classified: Secondary | ICD-10-CM | POA: Diagnosis present

## 2019-12-14 DIAGNOSIS — Z79899 Other long term (current) drug therapy: Secondary | ICD-10-CM | POA: Diagnosis not present

## 2019-12-14 DIAGNOSIS — I214 Non-ST elevation (NSTEMI) myocardial infarction: Secondary | ICD-10-CM | POA: Diagnosis not present

## 2019-12-14 DIAGNOSIS — I25719 Atherosclerosis of autologous vein coronary artery bypass graft(s) with unspecified angina pectoris: Secondary | ICD-10-CM | POA: Diagnosis not present

## 2019-12-14 DIAGNOSIS — I252 Old myocardial infarction: Secondary | ICD-10-CM | POA: Diagnosis not present

## 2019-12-14 DIAGNOSIS — N179 Acute kidney failure, unspecified: Secondary | ICD-10-CM | POA: Diagnosis present

## 2019-12-14 DIAGNOSIS — K219 Gastro-esophageal reflux disease without esophagitis: Secondary | ICD-10-CM | POA: Diagnosis present

## 2019-12-14 DIAGNOSIS — E7800 Pure hypercholesterolemia, unspecified: Secondary | ICD-10-CM | POA: Diagnosis not present

## 2019-12-14 DIAGNOSIS — Z950 Presence of cardiac pacemaker: Secondary | ICD-10-CM | POA: Diagnosis not present

## 2019-12-14 DIAGNOSIS — E782 Mixed hyperlipidemia: Secondary | ICD-10-CM | POA: Diagnosis not present

## 2019-12-14 DIAGNOSIS — Z8679 Personal history of other diseases of the circulatory system: Secondary | ICD-10-CM

## 2019-12-14 DIAGNOSIS — E785 Hyperlipidemia, unspecified: Secondary | ICD-10-CM | POA: Diagnosis not present

## 2019-12-14 DIAGNOSIS — I119 Hypertensive heart disease without heart failure: Secondary | ICD-10-CM | POA: Diagnosis not present

## 2019-12-14 DIAGNOSIS — I272 Pulmonary hypertension, unspecified: Secondary | ICD-10-CM | POA: Diagnosis present

## 2019-12-14 DIAGNOSIS — I110 Hypertensive heart disease with heart failure: Secondary | ICD-10-CM | POA: Diagnosis present

## 2019-12-14 DIAGNOSIS — I739 Peripheral vascular disease, unspecified: Secondary | ICD-10-CM | POA: Diagnosis present

## 2019-12-14 DIAGNOSIS — R000 Tachycardia, unspecified: Secondary | ICD-10-CM | POA: Diagnosis not present

## 2019-12-14 DIAGNOSIS — I361 Nonrheumatic tricuspid (valve) insufficiency: Secondary | ICD-10-CM | POA: Diagnosis not present

## 2019-12-14 DIAGNOSIS — Z9889 Other specified postprocedural states: Secondary | ICD-10-CM

## 2019-12-14 DIAGNOSIS — Z720 Tobacco use: Secondary | ICD-10-CM | POA: Diagnosis present

## 2019-12-14 DIAGNOSIS — I25810 Atherosclerosis of coronary artery bypass graft(s) without angina pectoris: Secondary | ICD-10-CM | POA: Diagnosis not present

## 2019-12-14 DIAGNOSIS — R001 Bradycardia, unspecified: Secondary | ICD-10-CM | POA: Diagnosis not present

## 2019-12-14 DIAGNOSIS — I2510 Atherosclerotic heart disease of native coronary artery without angina pectoris: Secondary | ICD-10-CM | POA: Diagnosis not present

## 2019-12-14 LAB — COMPREHENSIVE METABOLIC PANEL
ALT: 16 U/L (ref 0–44)
AST: 19 U/L (ref 15–41)
Albumin: 3.4 g/dL — ABNORMAL LOW (ref 3.5–5.0)
Alkaline Phosphatase: 55 U/L (ref 38–126)
Anion gap: 10 (ref 5–15)
BUN: 25 mg/dL — ABNORMAL HIGH (ref 8–23)
CO2: 35 mmol/L — ABNORMAL HIGH (ref 22–32)
Calcium: 9.1 mg/dL (ref 8.9–10.3)
Chloride: 93 mmol/L — ABNORMAL LOW (ref 98–111)
Creatinine, Ser: 1.1 mg/dL (ref 0.61–1.24)
GFR calc Af Amer: >60 mL/min (ref >60)
GFR calc non Af Amer: >60 mL/min (ref >60)
Glucose, Bld: 218 mg/dL — ABNORMAL HIGH (ref 70–99)
Potassium: 4 mmol/L (ref 3.5–5.1)
Sodium: 138 mmol/L (ref 135–145)
Total Bilirubin: 0.7 mg/dL (ref 0.3–1.2)
Total Protein: 6.7 g/dL (ref 6.5–8.1)

## 2019-12-14 LAB — TSH: TSH: 0.871 u[IU]/mL (ref 0.350–4.500)

## 2019-12-14 LAB — HEMOGLOBIN A1C
Hgb A1c MFr Bld: 7.2 % — ABNORMAL HIGH (ref 4.8–5.6)
Mean Plasma Glucose: 159.94 mg/dL

## 2019-12-14 LAB — TROPONIN I (HIGH SENSITIVITY): Troponin I (High Sensitivity): 2228 ng/L

## 2019-12-14 LAB — BRAIN NATRIURETIC PEPTIDE: B Natriuretic Peptide: 181.5 pg/mL — ABNORMAL HIGH (ref 0.0–100.0)

## 2019-12-14 LAB — HEPARIN LEVEL (UNFRACTIONATED): Heparin Unfractionated: 0.42 IU/mL (ref 0.30–0.70)

## 2019-12-14 MED ORDER — LISINOPRIL 10 MG PO TABS
10.0000 mg | ORAL_TABLET | Freq: Every day | ORAL | Status: DC
  Administered 2019-12-15 – 2019-12-16 (×2): 10 mg via ORAL
  Filled 2019-12-14 (×2): qty 1

## 2019-12-14 MED ORDER — PANTOPRAZOLE SODIUM 40 MG PO TBEC
40.0000 mg | DELAYED_RELEASE_TABLET | Freq: Every day | ORAL | Status: DC
  Administered 2019-12-14 – 2019-12-17 (×4): 40 mg via ORAL
  Filled 2019-12-14 (×4): qty 1

## 2019-12-14 MED ORDER — ROSUVASTATIN CALCIUM 20 MG PO TABS
40.0000 mg | ORAL_TABLET | Freq: Every day | ORAL | Status: DC
  Administered 2019-12-14 – 2019-12-16 (×3): 40 mg via ORAL
  Filled 2019-12-14 (×3): qty 2

## 2019-12-14 MED ORDER — IPRATROPIUM-ALBUTEROL 0.5-2.5 (3) MG/3ML IN SOLN
3.0000 mL | Freq: Three times a day (TID) | RESPIRATORY_TRACT | Status: DC
  Administered 2019-12-15 – 2019-12-16 (×4): 3 mL via RESPIRATORY_TRACT
  Filled 2019-12-14 (×4): qty 3

## 2019-12-14 MED ORDER — HEPARIN (PORCINE) 25000 UT/250ML-% IV SOLN
1200.0000 [IU]/h | INTRAVENOUS | Status: DC
  Administered 2019-12-14 – 2019-12-15 (×2): 1200 [IU]/h via INTRAVENOUS
  Filled 2019-12-14 (×2): qty 250

## 2019-12-14 MED ORDER — IPRATROPIUM-ALBUTEROL 0.5-2.5 (3) MG/3ML IN SOLN: 3.0000 mL | Freq: Three times a day (TID) | RESPIRATORY_TRACT | Status: DC

## 2019-12-14 MED ORDER — RANOLAZINE ER 500 MG PO TB12
1000.0000 mg | ORAL_TABLET | Freq: Two times a day (BID) | ORAL | Status: DC
  Administered 2019-12-14 – 2019-12-17 (×6): 1000 mg via ORAL
  Filled 2019-12-14 (×6): qty 2

## 2019-12-14 MED ORDER — AMLODIPINE BESYLATE 2.5 MG PO TABS
2.5000 mg | ORAL_TABLET | Freq: Every day | ORAL | Status: DC
  Administered 2019-12-15 – 2019-12-17 (×3): 2.5 mg via ORAL
  Filled 2019-12-14 (×3): qty 1

## 2019-12-14 MED ORDER — LISINOPRIL 10 MG PO TABS: 10.0000 mg | ORAL_TABLET | Freq: Every day | ORAL | Status: DC

## 2019-12-14 MED ORDER — ACETAMINOPHEN 500 MG PO TABS: 1000.0000 mg | ORAL_TABLET | Freq: Four times a day (QID) | ORAL | Status: DC | PRN

## 2019-12-14 MED ORDER — NITROGLYCERIN 0.4 MG SL SUBL: 0.4000 mg | SUBLINGUAL_TABLET | SUBLINGUAL | Status: DC | PRN

## 2019-12-14 MED ORDER — AMLODIPINE BESYLATE 2.5 MG PO TABS: 2.5000 mg | ORAL_TABLET | Freq: Every day | ORAL | Status: DC

## 2019-12-14 MED ORDER — ASPIRIN EC 81 MG PO TBEC
81.0000 mg | DELAYED_RELEASE_TABLET | Freq: Every day | ORAL | Status: DC
  Administered 2019-12-15 – 2019-12-17 (×2): 81 mg via ORAL
  Filled 2019-12-14 (×3): qty 1

## 2019-12-14 MED ORDER — CARVEDILOL 6.25 MG PO TABS
6.2500 mg | ORAL_TABLET | Freq: Two times a day (BID) | ORAL | Status: DC
  Administered 2019-12-14 – 2019-12-16 (×5): 6.25 mg via ORAL
  Filled 2019-12-14 (×5): qty 1

## 2019-12-14 MED ORDER — ACETAMINOPHEN 325 MG PO TABS: 650.0000 mg | ORAL_TABLET | ORAL | Status: DC | PRN

## 2019-12-14 MED ORDER — FAMOTIDINE 20 MG PO TABS
20.0000 mg | ORAL_TABLET | Freq: Every day | ORAL | Status: DC
  Administered 2019-12-14 – 2019-12-16 (×3): 20 mg via ORAL
  Filled 2019-12-14 (×3): qty 1

## 2019-12-14 NOTE — H&P (Addendum)
Cardiology Admission History and Physical:   Patient ID: Clifford Jones MRN: [id number]; DOB: [birth year]   Admission date: 12/14/2019  Primary Care Provider: Lavella Lemons, PA [facility] Cardiologist: Kate Sable, MD  [facility] Electrophysiologist:  None   Chief Complaint:  Chest pain  Patient Profile:   Clifford Jones is a 69 y.o. male with CAD (s/p CABG in 2003 with NSTEMI in 2008 and DES to [place], NSTEMI in 2013 with DES to OM, and NSTEMI in 2015 with DES to [place]), HTN, HLD,and a prior history of tobacco use who presents after an episode of CP found to have elevated cardiac enzymes concerning for NSTEMI.   History of Present Illness:   Mr. Portell presented to the [facility] ER after 2 hours of dull, substernal chest pressure that began while eating pizza and drinking beer. He took 1 SLN with improvement but not eradication of the pain, which he felt was similar to his typical angina. When this did not complete relieve his pain he presented for evaluation.   Upon arrival to [facility], was normotensive with sinus tachycardia in low 100s. CP was 3/10 on arrival. Additional SLN was administered with cessation of pain. Initial EKG without ischemic changes and unchanged from prior tracing in our system in 05/2019. Other labs only notable for slight AKI to 1.02 from 0.8. Initial trop 0.51 -> 0.52 -> 3.1. Heparin gtt was initiated and he was accepted to [facility] for transfer.   Upon arrival to [facility], he is pain free and has not had recurrent CP since last night. He tells me that he does have occasional episode of angina, but that they usually respond rapidly to 1 SLN and this particular episode "felt different" and did not resolve completely after 1 SLN. He is usually ambulatory without typical exertional CP. He has been taking his lasix almost daily, and doesn't feel as though he has a significant amount of fluid. Has largely remained free of tobacco.    Past Medical History:  Diagnosis  Date  . Ampullary adenoma   . Arthritis    Reported by patient  . Cancer [facility])    Reported by patient  . Claustrophobia   . Closed fracture of right distal tibia   . COPD (chronic obstructive pulmonary disease) ([place])    Reported by patient  . Coronary artery disease    a. 1999 Inf MI;  b. 2003 CABGx6;  c. 2008 NSTEMI, occluded SVG to marginal and diagonal system. 2 DES placed to marginal system;  c. NSTEMI - 02/2012 Xience Xpedition DES to oS-OM and POBA to mid lesion;  c. 10/2013 NSTEMI/PCI: VG->OM 80 ISR, native OM 80/90 small, LIMA->LAD ok w/ 90 dLAD, D1 80, RIMA->RPL ok w/ 95 in RPL (2.5x14 Resolute DES), nl EF.  Marland Kitchen Dyspnea    Reported by patient  . GERD (gastroesophageal reflux disease)   . Hyperlipidemia   . Hypertension   . Myocardial infarction ([place])    X 6  . Pneumonia   . Presence of permanent cardiac pacemaker   . S/P AAA repair   . Tobacco abuse   . Wears dentures   . Wears glasses     Past Surgical History:  Procedure Laterality Date  . ABDOMINAL AORTIC ANEURYSM REPAIR  2006  . APPENDECTOMY    . CORONARY ANGIOPLASTY WITH STENT PLACEMENT    . CORONARY ARTERY BYPASS GRAFT  2003  . ENDOSCOPIC RETROGRADE CHOLANGIOPANCREATOGRAPHY (ERCP) WITH PROPOFOL N/A 03/25/2019   Procedure: ENDOSCOPIC RETROGRADE CHOLANGIOPANCREATOGRAPHY (ERCP)  WITH PROPOFOL;  Surgeon: Mansouraty, Telford Nab., MD;  Location: [place];  Service: Gastroenterology;  Laterality: N/A;  . ESOPHAGEAL DILATION  07/06/2018   Procedure: ESOPHAGEAL DILATION;  Surgeon: Rogene Houston, MD;  [facility];  Service: Endoscopy;;  . ESOPHAGOGASTRODUODENOSCOPY N/A 07/06/2018   Procedure: ESOPHAGOGASTRODUODENOSCOPY (EGD);  Surgeon: Rogene Houston, MD;  [facility];  Service: Endoscopy;  Laterality: N/A;  . ESOPHAGOGASTRODUODENOSCOPY N/A 08/16/2018   Procedure: ESOPHAGOGASTRODUODENOSCOPY (EGD);  Surgeon: Milus Banister, MD;  Location[facility];  Service: Endoscopy;  Laterality: N/A;  .  ESOPHAGOGASTRODUODENOSCOPY (EGD) WITH PROPOFOL N/A 03/25/2019   Procedure: ESOPHAGOGASTRODUODENOSCOPY (EGD) WITH PROPOFOL;  Surgeon: Rush Landmark Telford Nab., MD;  Location: [place];  Service: Gastroenterology;  Laterality: N/A;  . EUS N/A 08/16/2018   Procedure: UPPER ENDOSCOPIC ULTRASOUND (EUS) RADIAL;  Surgeon: Milus Banister, MD;  Location: [facility];  Service: Endoscopy;  Laterality: N/A;  . EUS N/A 03/25/2019   Procedure: UPPER ENDOSCOPIC ULTRASOUND (EUS) RADIAL;  Surgeon: Rush Landmark Telford Nab., MD;  Location: [place];  Service: Gastroenterology;  Laterality: N/A;  . FOREIGN BODY REMOVAL  07/06/2018   Procedure: FOREIGN BODY REMOVAL;  Surgeon: Rogene Houston, MD;  [facility];  Service: Endoscopy;;  . HERNIA REPAIR     umbicical  . LEFT HEART CATHETERIZATION WITH CORONARY ANGIOGRAM N/A 03/05/2012   Procedure: LEFT HEART CATHETERIZATION WITH CORONARY ANGIOGRAM;  Surgeon: Hillary Bow, MD;  Location: [facility] CATH LAB;  Service: Cardiovascular;  Laterality: N/A;  . LEFT HEART CATHETERIZATION WITH CORONARY ANGIOGRAM N/A 10/23/2013   Procedure: LEFT HEART CATHETERIZATION WITH CORONARY ANGIOGRAM;  Surgeon: Blane Ohara, MD;  Location: [facility] CATH LAB;  Service: Cardiovascular;  Laterality: N/A;  . LEFT HEART CATHETERIZATION WITH CORONARY/GRAFT ANGIOGRAM  10/22/2013   Procedure: LEFT HEART CATHETERIZATION WITH Beatrix Fetters;  Surgeon: Leonie Man, MD;  Location: [facility] CATH LAB;  Service: Cardiovascular;;  . PANCREATIC STENT PLACEMENT  03/25/2019   Procedure: PANCREATIC STENT PLACEMENT;  Surgeon: Rush Landmark Telford Nab., MD;  Location: [place];  Service: Gastroenterology;;  . PERCUTANEOUS CORONARY STENT INTERVENTION (PCI-S) N/A 03/07/2012   Procedure: PERCUTANEOUS CORONARY STENT INTERVENTION (PCI-S);  Surgeon: Sherren Mocha, MD;  Location: [facility] CATH LAB;  Service: Cardiovascular;  Laterality: N/A;  . PERCUTANEOUS CORONARY STENT INTERVENTION (PCI-S) Right 10/23/2013     Procedure: PERCUTANEOUS CORONARY STENT INTERVENTION (PCI-S);  Surgeon: Blane Ohara, MD;  Location: [facility] CATH LAB;  Service: Cardiovascular;  Laterality: Right;  . POLYPECTOMY  07/06/2018   Procedure: POLYPECTOMY;  Surgeon: Rogene Houston, MD;  [facility];  Service: Endoscopy;;  polyp at ampulla  . POLYPECTOMY  03/25/2019   Procedure: AMPULLECTOMY;  Surgeon: Mansouraty, Telford Nab., MD;  Location: [place];  Service: Gastroenterology;;  . Right hand surgery    . SPHINCTEROTOMY  03/25/2019   Procedure: SPHINCTEROTOMY;  Surgeon: Mansouraty, Telford Nab., MD;  Location: [place];  Service: Gastroenterology;;  . TIBIA IM NAIL INSERTION Right 04/07/2016   Procedure: FIXATION OF RIGHT TIBIA FRACTURE;  Surgeon: Garald Balding, MD;  Location: [place];  Service: Orthopedics;  Laterality: Right;     Medications Prior to Admission: Prior to Admission medications   Medication Sig Start Date End Date Taking? Authorizing Provider  acetaminophen (TYLENOL) 500 MG tablet Take 1,000 mg by mouth every 6 (six) hours as needed for mild pain, moderate pain or headache.    Yes [provider]  amLODipine (NORVASC) 2.5 MG tablet Take 1 tablet (2.5 mg total) by mouth  daily. 10/23/19 01/21/20 Yes Herminio Commons, MD  aspirin 81 MG EC tablet Take 1 tablet (81 mg total) by mouth daily. 07/09/18  Yes Rehman, Mechele Dawley, MD  carvedilol (COREG) 6.25 MG tablet TAKE ONE TABLET BY MOUTH TWICE DAILY Patient taking differently: Take 6.25 mg by mouth 2 (two) times daily.  03/17/17  Yes Herminio Commons, MD  famotidine (PEPCID) 20 MG tablet Take 20 mg by mouth 2 (two) times daily as needed for heartburn or indigestion (or bloating).  12/02/19  Yes [provider]  furosemide (LASIX) 40 MG tablet Take 1 tablet (40 mg total) by mouth daily. 01/29/19  Yes Branch, Alphonse Guild, MD  ipratropium-albuterol (DUONEB) 0.5-2.5 (3) MG/3ML SOLN Inhale 3 mLs into the lungs every 6 (six) hours. Patient  taking differently: Inhale 3 mLs into the lungs every 6 (six) hours as needed (for wheezing or shortness of breath).  06/04/19 12/14/19 Yes Shahmehdi, Seyed A, MD  lisinopril (ZESTRIL) 10 MG tablet Take 10 mg by mouth daily. 12/02/19  Yes [provider]  nitroGLYCERIN (NITROSTAT) 0.4 MG SL tablet Place 1 tablet (0.4 mg total) under the tongue every 5 (five) minutes x 3 doses as needed for chest pain. 03/03/15  Yes Isaiah Serge, NP  pantoprazole (PROTONIX) 40 MG tablet TAKE 1 TABLET BY MOUTH TWICE DAILY Patient taking differently: Take 40 mg by mouth 2 (two) times daily.  06/14/18  Yes Herminio Commons, MD  RANEXA 1000 MG SR tablet TAKE ONE TABLET BY MOUTH TWICE DAILY Patient taking differently: Take 1,000 mg by mouth 2 (two) times daily with a meal.  03/17/17  Yes Herminio Commons, MD  rosuvastatin (CRESTOR) 40 MG tablet TAKE ONE TABLET BY MOUTH EVERY DAY Patient taking differently: Take 40 mg by mouth at bedtime.  03/17/17  Yes Herminio Commons, MD  budesonide (PULMICORT) 0.5 MG/2ML nebulizer solution Take 2 mLs (0.5 mg total) by nebulization 2 (two) times daily. Patient not taking: Reported on 12/14/2019 06/04/19 12/14/19  Deatra Jaysion, MD  lisinopril (ZESTRIL) 20 MG tablet Take 0.5 tablets (10 mg total) by mouth daily. Patient not taking: Reported on 12/14/2019 06/04/19   Deatra Damaris, MD     Allergies:   No Known Allergies  Social History:   Social History   Socioeconomic History  . Marital status: Legally Separated    Spouse name: Not on file  . Number of children: Not on file  . Years of education: Not on file  . Highest education level: Not on file  Occupational History  . Not on file  Tobacco Use  . Smoking status: Former Smoker    Packs/day: 1.00    Years: 54.00    Pack years: 54.00    Types: Cigarettes    Start date: 10/04/1964    Quit date: 05/30/2019    Years since quitting: 0.5  . Smokeless tobacco: Never Used  Vaping Use  . Vaping Use:  Never used  Substance and Sexual Activity  . Alcohol use: Yes    Comment: Maybe 2 beers a month and 2 shotsa month  . Drug use: No  . Sexual activity: Yes  Other Topics Concern  . Not on file  Social History Narrative   Disabled.  Former truck Software engineer. Divorced and remarried x 2         Social Determinants of Radio broadcast assistant Strain:   . Difficulty of Paying Living Expenses:   Food Insecurity:   .  Worried About Charity fundraiser in the Last Year:   . Arboriculturist in the Last Year:   Transportation Needs:   . Film/video editor (Medical):   Marland Kitchen Lack of Transportation (Non-Medical):   Physical Activity:   . Days of Exercise per Week:   . Minutes of Exercise per Session:   Stress:   . Feeling of Stress :   Social Connections:   . Frequency of Communication with Friends and Family:   . Frequency of Social Gatherings with Friends and Family:   . Attends Religious Services:   . Active Member of Clubs or Organizations:   . Attends Archivist Meetings:   Marland Kitchen Marital Status:   Intimate Partner Violence:   . Fear of Current or Ex-Partner:   . Emotionally Abused:   Marland Kitchen Physically Abused:   . Sexually Abused:     Family History:   The patient's family history includes Coronary artery disease in an other family member. There is no history of Colon cancer, Esophageal cancer, Inflammatory bowel disease, Liver disease, Pancreatic cancer, Rectal cancer, or Stomach cancer.    ROS:  Please see the history of present illness.  All other ROS reviewed and negative.     Physical Exam/Data:   Vitals:   12/14/19 1736  Weight: 107 kg  Height: 6' (1.829 m)   No intake or output data in the 24 hours ending 12/14/19 1920 Last 3 Weights 12/14/2019 09/03/2019 06/01/2019  Weight (lbs) 235 lb 14.4 oz 235 lb 225 lb 15.5 oz  Weight (kg) 107.004 kg 106.595 kg 102.5 kg     Body mass index is 31.99 kg/m.  General:  Well nourished, well developed, in no  acute distress. Lying nearly flat. [place] in situ.  HEENT: normal Neck: JVD difficult to assess Vascular: No carotid bruits; FA pulses 2+ bilaterally without bruits  Cardiac:  normal S1, S2; RRR; no murmurs Lungs:  Very limited air movement.  Abd: obese, soft, nontender, no hepatomegaly  Ext: 1+ pitting edema bilaterally.  Musculoskeletal:  No deformities, BUE and BLE strength normal and equal Skin: warm and dry  Psych:  Normal affect   EKG:  The ECG that was done demonstrates sinus tachycardia without ischemic changes.   Relevant CV Studies: TTE 01/2019: IMPRESSIONS    1. The left ventricle has normal systolic function, with an ejection  fraction of 55-60%. The cavity size was normal. Mild basal septal  hypertrophy. Diastolic dysfunction, grade indeterminate. Indeterminate  filling pressures No evidence of left  ventricular regional wall motion abnormalities.  2. The mitral valve is grossly normal.  3. The tricuspid valve is grossly normal.  4. The aortic valve is tricuspid. Mild thickening of the aortic valve.  Sclerosis without any evidence of stenosis of the aortic valve.  5. The aorta is normal unless otherwise noted.  6. The inferior vena cava was dilated in size with >50% respiratory variability.  7. The interatrial septum appears to be lipomatous.   Laboratory Data: [facility]: WBC 10.1, Hgb 15.6, Plt 241 Na 138, K 4.4, CO@ 33, BUN 22, Cr 1.06 (slight AKI) Mg 1.8 Troponin I 0.52 -> 0.51 -> 3.138  Radiology/Studies: [facility]: CXR: Postsurgical changes related to prior stenotomy and CABG Mild central vascular congestion with cephalization.  No focal consolidation.        TIMI Risk Score for Unstable Angina or Non-ST Elevation MI:   The patient's TIMI risk score is 5, which indicates a 26% risk of all  cause mortality, new or recurrent myocardial infarction or need for urgent revascularization in the next 14 days.      Assessment and Plan:   1.  Coronary disease 2. NSTEMI  Status post CABG in 2003 with NSTEMI in 2008 and DES to [place], NSTEMI in 2013 with DES to OM, and NSTEMI in 2015 with DES to [place]. Now with CP and elevated markers.  -- Heparin gtt, ACS dosing -- ASA 324 administered, continue ASA 81mg  daily -- Complete TTE ordered -- NPO @ [place] Sunday for coronary angiography -- Risk stratification with lipid panel, Hba1c -- Consult to cardiac rehab -- Continue home carvedilol, ranolazine, amlodipine, lisinopril, and rosuvastatin.   3.  Hypertension:  -- Continue home lisinopril, amlodipine, coreg, as above.   3.  Chronic diastolic heart failure -- Mildly hypervolemic by exam and CXR -- Check BNP -- Will plan 1 day of IV lasix tomorrow and monitor renal function. -- Strict I/O, daily weight.  -- Suspect OSA, should have outpatient sleep study.   4.  Hyperlipidemia Continue rosuvastatin 40 mg daily.  5. COPD -- Scheduled Duonebs given poor air movement.  -- Supplemental oxygen for SpO2 88 - 92%.   Severity of Illness: The appropriate patient status for this patient is INPATIENT. Inpatient status is judged to be reasonable and necessary in order to provide the required intensity of service to ensure the patient's safety. The patient's presenting symptoms, physical exam findings, and initial radiographic and laboratory data in the context of their chronic comorbidities is felt to place them at high risk for further clinical deterioration. Furthermore, it is not anticipated that the patient will be medically stable for discharge from the hospital within 2 midnights of admission. The following factors support the patient status of inpatient.   " The patient's presenting symptoms include chest pain. " The worrisome physical exam findings include decreased breath sounds.  " The initial radiographic and laboratory data are worrisome because of elevated troponin " The chronic co-morbidities include COPD, HTN.    * I certify that  at the point of admission it is my clinical judgment that the patient will require inpatient hospital care spanning beyond 2 midnights from the point of admission due to high intensity of service, high risk for further deterioration and high frequency of surveillance required.*    For questions or updates, please contact [place] Please consult [email or web address] for contact info under     Signed, Milus Banister, MD  12/14/2019 7:20 PM

## 2019-12-14 NOTE — Progress Notes (Signed)
CRITICAL VALUE ALERT  Critical Value:  Troponin 2,228  Date & Time Notied:  12/14/19 @ 2028  Provider Notified: Dr. Marletta Lor  Orders Received/Actions taken: MD aware of previously elevated troponin.  No new orders received.  Heparin gtt infusing.

## 2019-12-14 NOTE — Progress Notes (Signed)
ANTICOAGULATION CONSULT NOTE - Initial Consult  Pharmacy Consult for Heparin Indication: chest pain/ACS  No Known Allergies  Patient Measurements: Height: 6' (182.9 cm) Weight: 107 kg (235 lb 14.4 oz) IBW/kg (Calculated) : 77.6 Heparin Dosing Weight: 100kg  Vital Signs: Temp: 98.1 F (36.7 C) (06/26 2032) Temp Source: Oral (06/26 2032) BP: 146/77 (06/26 2032) Pulse Rate: 73 (06/26 2032)  Labs: Recent Labs    12/14/19 1901 12/14/19 2005  HEPARINUNFRC  --  0.42  CREATININE 1.10  --   TROPONINIHS 2,228*  --     Estimated Creatinine Clearance: 82.4 mL/min (by C-G formula based on SCr of 1.1 mg/dL).   Medical History: Past Medical History:  Diagnosis Date  . Ampullary adenoma   . Arthritis    Reported by patient  . Cancer [facility])    Reported by patient  . Claustrophobia   . Closed fracture of right distal tibia   . COPD (chronic obstructive pulmonary disease) ([place])    Reported by patient  . Coronary artery disease    a. 1999 Inf MI;  b. 2003 CABGx6;  c. 2008 NSTEMI, occluded SVG to marginal and diagonal system. 2 DES placed to marginal system;  c. NSTEMI - 02/2012 Xience Xpedition DES to oS-OM and POBA to mid lesion;  c. 10/2013 NSTEMI/PCI: VG->OM 80 ISR, native OM 80/90 small, LIMA->LAD ok w/ 90 dLAD, D1 80, RIMA->RPL ok w/ 95 in RPL (2.5x14 Resolute DES), nl EF.  Marland Kitchen Dyspnea    Reported by patient  . GERD (gastroesophageal reflux disease)   . Hyperlipidemia   . Hypertension   . Myocardial infarction ([place])    X 6  . Pneumonia   . Presence of permanent cardiac pacemaker   . S/P AAA repair   . Tobacco abuse   . Wears dentures   . Wears glasses      Assessment: 69yom with Hx CAD, CABG  2003,DES  2008, 2013, 2015, admitted to OSH with CP yesterday and transferred to [facility] today with +trop.  Possible cath on Monday Heparin drip started at OSH 1200uts/hr HL on admit here 0.42 at goal.   Goal of Therapy:  Heparin level 0.3-0.7 units/ml Monitor platelets by  anticoagulation protocol: Yes   Plan:  Continue Heparin drip 1200 uts/hr  Daily HL, CBC Monitor s/s bleeding  Bonnita Nasuti Pharm.D. CPP, BCPS Clinical Pharmacist [phone] 12/14/2019 8:45 PM

## 2019-12-15 ENCOUNTER — Inpatient Hospital Stay (HOSPITAL_COMMUNITY): Payer: Medicare HMO

## 2019-12-15 DIAGNOSIS — E782 Mixed hyperlipidemia: Secondary | ICD-10-CM

## 2019-12-15 DIAGNOSIS — I119 Hypertensive heart disease without heart failure: Secondary | ICD-10-CM

## 2019-12-15 DIAGNOSIS — I361 Nonrheumatic tricuspid (valve) insufficiency: Secondary | ICD-10-CM

## 2019-12-15 DIAGNOSIS — Z87891 Personal history of nicotine dependence: Secondary | ICD-10-CM

## 2019-12-15 DIAGNOSIS — I214 Non-ST elevation (NSTEMI) myocardial infarction: Principal | ICD-10-CM

## 2019-12-15 LAB — CBC
HCT: 46.3 % (ref 39.0–52.0)
Hemoglobin: 14.4 g/dL (ref 13.0–17.0)
MCH: 30.6 pg (ref 26.0–34.0)
MCHC: 31.1 g/dL (ref 30.0–36.0)
MCV: 98.3 fL (ref 80.0–100.0)
Platelets: 178 10*3/uL (ref 150–400)
RBC: 4.71 MIL/uL (ref 4.22–5.81)
RDW: 12.5 % (ref 11.5–15.5)
WBC: 11.1 10*3/uL — ABNORMAL HIGH (ref 4.0–10.5)
nRBC: 0 % (ref 0.0–0.2)

## 2019-12-15 LAB — ECHOCARDIOGRAM COMPLETE
Height: 72 in
Weight: 3760 [oz_av]

## 2019-12-15 LAB — BASIC METABOLIC PANEL
Anion gap: 8 (ref 5–15)
BUN: 24 mg/dL — ABNORMAL HIGH (ref 8–23)
CO2: 36 mmol/L — ABNORMAL HIGH (ref 22–32)
Calcium: 9 mg/dL (ref 8.9–10.3)
Chloride: 95 mmol/L — ABNORMAL LOW (ref 98–111)
Creatinine, Ser: 1.06 mg/dL (ref 0.61–1.24)
GFR calc Af Amer: >60 mL/min (ref >60)
GFR calc non Af Amer: >60 mL/min (ref >60)
Glucose, Bld: 172 mg/dL — ABNORMAL HIGH (ref 70–99)
Potassium: 4.6 mmol/L (ref 3.5–5.1)
Sodium: 139 mmol/L (ref 135–145)

## 2019-12-15 LAB — LIPID PANEL
Cholesterol: 143 mg/dL (ref 0–200)
HDL: 57 mg/dL
LDL Cholesterol: 52 mg/dL (ref 0–99)
Total CHOL/HDL Ratio: 2.5 ratio
Triglycerides: 168 mg/dL — ABNORMAL HIGH
VLDL: 34 mg/dL (ref 0–40)

## 2019-12-15 LAB — HEPARIN LEVEL (UNFRACTIONATED): Heparin Unfractionated: 0.42 IU/mL (ref 0.30–0.70)

## 2019-12-15 LAB — TROPONIN I (HIGH SENSITIVITY)
Troponin I (High Sensitivity): 895 ng/L
Troponin I (High Sensitivity): 768 ng/L (ref <18)

## 2019-12-15 MED ORDER — SODIUM CHLORIDE 0.9 % IV SOLN: 250.0000 mL | INTRAVENOUS | Status: DC | PRN

## 2019-12-15 MED ORDER — SODIUM CHLORIDE 0.9 % WEIGHT BASED INFUSION
3.0000 mL/kg/h | INTRAVENOUS | Status: DC
  Administered 2019-12-16: 3 mL/kg/h via INTRAVENOUS

## 2019-12-15 MED ORDER — SODIUM CHLORIDE 0.9% FLUSH
3.0000 mL | Freq: Two times a day (BID) | INTRAVENOUS | Status: DC
  Administered 2019-12-15 – 2019-12-17 (×3): 3 mL via INTRAVENOUS

## 2019-12-15 MED ORDER — PERFLUTREN LIPID MICROSPHERE
1.0000 mL | INTRAVENOUS | Status: AC | PRN
  Administered 2019-12-15: 4 mL via INTRAVENOUS
  Filled 2019-12-15: qty 10

## 2019-12-15 MED ORDER — SODIUM CHLORIDE 0.9% FLUSH: 3.0000 mL | INTRAVENOUS | Status: DC | PRN

## 2019-12-15 MED ORDER — ALBUTEROL SULFATE (2.5 MG/3ML) 0.083% IN NEBU
2.5000 mg | INHALATION_SOLUTION | RESPIRATORY_TRACT | Status: DC | PRN
  Administered 2019-12-17: 2.5 mg via RESPIRATORY_TRACT
  Filled 2019-12-15: qty 3

## 2019-12-15 MED ORDER — ALBUTEROL SULFATE (2.5 MG/3ML) 0.083% IN NEBU
INHALATION_SOLUTION | RESPIRATORY_TRACT | Status: AC
  Administered 2019-12-15: 2.5 mg
  Filled 2019-12-15: qty 3

## 2019-12-15 MED ORDER — SODIUM CHLORIDE 0.9 % WEIGHT BASED INFUSION
1.0000 mL/kg/h | INTRAVENOUS | Status: DC
  Administered 2019-12-16: 10 mL/h via INTRAVENOUS

## 2019-12-15 MED ORDER — ASPIRIN 81 MG PO CHEW
81.0000 mg | CHEWABLE_TABLET | ORAL | Status: AC
  Administered 2019-12-16: 81 mg via ORAL
  Filled 2019-12-15: qty 1

## 2019-12-15 NOTE — Plan of Care (Signed)

## 2019-12-15 NOTE — Progress Notes (Signed)
Patient declined watching cath video

## 2019-12-15 NOTE — Progress Notes (Signed)
ANTICOAGULATION CONSULT NOTE - Follow-Up Consult  Pharmacy Consult for Heparin Indication: chest pain/ACS  No Known Allergies  Patient Measurements: Height: 6' (182.9 cm) Weight: 106.6 kg (235 lb) IBW/kg (Calculated) : 77.6 Heparin Dosing Weight: 100kg  Vital Signs: Temp: 98.2 F (36.8 C) (06/27 0833) Temp Source: Oral (06/27 0833) BP: 131/74 (06/27 0935) Pulse Rate: 77 (06/27 0833)  Labs: Recent Labs    12/14/19 1901 12/14/19 2005 12/15/19 0236 12/15/19 0617 12/15/19 0805  HGB  --   --   --   --  14.4  HCT  --   --   --   --  46.3  PLT  --   --   --   --  178  HEPARINUNFRC  --  0.42  --   --  0.42  CREATININE 1.10  --  1.06  --   --   TROPONINIHS 2,228*  --   --  895* 768*    Estimated Creatinine Clearance: 85.3 mL/min (by C-G formula based on SCr of 1.06 mg/dL).   Medical History: Past Medical History:  Diagnosis Date  . Ampullary adenoma   . Arthritis    Reported by patient  . Cancer [facility])    Reported by patient  . Claustrophobia   . Closed fracture of right distal tibia   . COPD (chronic obstructive pulmonary disease) ([place])    Reported by patient  . Coronary artery disease    a. 1999 Inf MI;  b. 2003 CABGx6;  c. 2008 NSTEMI, occluded SVG to marginal and diagonal system. 2 DES placed to marginal system;  c. NSTEMI - 02/2012 Xience Xpedition DES to oS-OM and POBA to mid lesion;  c. 10/2013 NSTEMI/PCI: VG->OM 80 ISR, native OM 80/90 small, LIMA->LAD ok w/ 90 dLAD, D1 80, RIMA->RPL ok w/ 95 in RPL (2.5x14 Resolute DES), nl EF.  Marland Kitchen Dyspnea    Reported by patient  . GERD (gastroesophageal reflux disease)   . Hyperlipidemia   . Hypertension   . Myocardial infarction ([place])    X 6  . Pneumonia   . Presence of permanent cardiac pacemaker   . S/P AAA repair   . Tobacco abuse   . Wears dentures   . Wears glasses      Assessment: 69 yo M with Hx CAD, CABG 2003, DES 2008, 2013, 2015, admitted to OSH with CP and transferred to [facility] for cardiac evaluation.   Possible cath on Monday.  Heparin level remains therapeutic on 1200 units/hr.  Goal of Therapy:  Heparin level 0.3-0.7 units/ml Monitor platelets by anticoagulation protocol: Yes   Plan:  Continue Heparin drip at 1200 units/hr  Daily heparin level and CBC Monitor s/s bleeding  [employer], Pharm.D., BCPS Clinical Pharmacist  **Pharmacist phone directory can be found on [email or web address] listed under [place].  12/15/2019 9:42 AM

## 2019-12-15 NOTE — H&P (View-Only) (Signed)
Progress Note  Patient Name: Clifford Jones Date of Encounter: 12/15/2019  [facility] Cardiologist: Kate Sable, MD   Subjective   No chest pain.  Feels well this morning.  Wants to do some walking in the hallways.  Inpatient Medications    Scheduled Meds:  amLODipine  2.5 mg Oral Daily   aspirin EC  81 mg Oral Daily   carvedilol  6.25 mg Oral BID   famotidine  20 mg Oral QHS   ipratropium-albuterol  3 mL Nebulization TID   lisinopril  10 mg Oral Daily   pantoprazole  40 mg Oral Daily   ranolazine  1,000 mg Oral BID   rosuvastatin  40 mg Oral QHS   Continuous Infusions:  heparin 1,200 Units/hr (12/14/19 2039)   PRN Meds: acetaminophen, albuterol, nitroGLYCERIN   Vital Signs    Vitals:   12/15/19 0529 12/15/19 0833 12/15/19 0834 12/15/19 0935  BP:  (!) 161/80  131/74  Pulse:  77    Resp: 19     Temp: 98.1 F (36.7 C) 98.2 F (36.8 C)    TempSrc: Oral Oral    SpO2:  91% 95%   Weight: 106.6 kg     Height:        Intake/Output Summary (Last 24 hours) at 12/15/2019 1014 Last data filed at 12/15/2019 0942 Gross per 24 hour  Intake 280 ml  Output 275 ml  Net 5 ml   Last 3 Weights 12/15/2019 12/14/2019 09/03/2019  Weight (lbs) 235 lb 235 lb 14.4 oz 235 lb  Weight (kg) 106.595 kg 107.004 kg 106.595 kg      Telemetry    Normal sinus rhythm- Personally Reviewed  ECG    Normal sinus rhythm, nonspecific ST segment changes- Personally Reviewed  Physical Exam   GEN: No acute distress.   Neck: No JVD Cardiac: RRR, no murmurs, rubs, or gallops.  Respiratory: Clear to auscultation bilaterally. GI: Soft, nontender, non-distended  MS: No edema; No deformity. Neuro:  Nonfocal  Psych: Normal affect   Labs    High Sensitivity Troponin:   Recent Labs  Lab 12/14/19 1901 12/15/19 0617 12/15/19 0805  TROPONINIHS 2,228* 895* 768*      Chemistry Recent Labs  Lab 12/14/19 1901 12/15/19 0236  NA 138 139  K 4.0 4.6  CL 93* 95*  CO2 35*  36*  GLUCOSE 218* 172*  BUN 25* 24*  CREATININE 1.10 1.06  CALCIUM 9.1 9.0  PROT 6.7  --   ALBUMIN 3.4*  --   AST 19  --   ALT 16  --   ALKPHOS 55  --   BILITOT 0.7  --   GFRNONAA >60 >60  GFRAA >60 >60  ANIONGAP 10 8     Hematology Recent Labs  Lab 12/15/19 0805  WBC 11.1*  RBC 4.71  HGB 14.4  HCT 46.3  MCV 98.3  MCH 30.6  MCHC 31.1  RDW 12.5  PLT 178    BNP Recent Labs  Lab 12/14/19 1901  BNP 181.5*     DDimer No results for input(s): DDIMER in the last 168 hours.   Radiology    No results found.  Cardiac Studies   EF 55 to 60% in August 2020  Patient Profile     69 y.o. male with known CAD and NSTEMI.  Assessment & Plan    CAD/NSTEMI: Plan for cardiac catheterization on Monday.  Okay for him to do some slow-paced walking since he is pain-free.  Continue IV heparin,  high-dose statin and beta-blocker along with aspirin.  All questions about cardiac cath answered.    Former tobacco user: Quit smoking 6 months ago.  Hypertensive heart disease: Blood pressure controlled this morning on medical therapy.  Hyperlipidemia: COntinue high dose statin.  Review of the 2015 cath record shows that left common femoral artery was required for access due to difficulty with his right groin.  It was difficult to engage his [place] graft from the left groin and tourniquet was applied on his arm and nonselective angiography was performed.  Intervention of the right PLA was then done from the right radial approach the next day.  Here is the text from the procedure note in 2015: "Coronary and graft Anatomy: Right Dominant presumably  Left Main: Normal caliber vessel with Ostial 60% narrowing as well as roughly 40% distal narrowing as it bifurcates into the LAD and Circumflex. LAD: The residual LAD is moderate caliber with about 70% stenosis prior to takeoff of a small diagonal that has a roughly 70-80% proximal stenosis. One of this diagonal Obie [place]red[place] is has another 70-80%  stenosis. Both of these if not necessarily PCI amenable. Beyond the diagonal and another septal perforator, the LAD is subtotally occluded with competitive flow from LIMA-LAD.  LIMA-LAD: Widely patent, somewhat tortuous graft that inserts into the mid LAD. There is minimal retrograde flow up the LAD, however antegrade flow reveals a very small caliber LAD with a now 90% segmental lesion just distal to the anastomosis. The LAD itself does reach down around the apex.  SVG-Diagonal: Known to be occluded from 2013, confirmed today  Left Circumflex: What amounts to be a relatively smaller moderate caliber vessel with diffuse plaque body very small proximal OM. The vessel is an essentially occluded distally with trivial flow indicating the presence of another obtuse marginal.  SVG-OM: Large-caliber, grossly irregular graft with an ostial DES stent (02/2012) has a roughly 80% in-stent restenosis in the proximal two thirds of the stent.  The distal stent that was treated with PTCA in 02/2012 is widely patent. Downstream vessel is significantly disease is noted below, it does not perfuse a large territory. ? Antegrade flow in the graft into the native circumflex shows a diffusely diseased degenerated native OM 2 with tandem 90 and 80% segmental lesions followed by a diffusely diseased distal vessel. ? Retrograde flow back up the AV groove circumflex leads to flow down a third marginal/LPL branch that is very small in caliber but without significant disease.    RCA: Known to be occluded  SVG-RCA: Nonselective imaging via subclavian angiography reveals that the graft itself is widely patent down to the target lesion, however flow in the target RCA was not adequately visualized.  After reviewing the initial angiography, the true culprit lesion was unclear. At this point since the [place] was not selectively engaged, the decision was made to abort the rest of procedure due to the length of fluoroscopy time and  contrast exposure.  He will likely require a return to the Cath Lab for potential Right Radial Access to engage the [place]."   For questions or updates, please contact [facility] Please consult [email or web address] for contact info under        Signed, Larae Grooms, MD  12/15/2019, 10:14 AM

## 2019-12-15 NOTE — Progress Notes (Signed)
  Echocardiogram 2D Echocardiogram has been performed with Definity.  Clifford Jones 12/15/2019, 3:08 PM

## 2019-12-15 NOTE — Progress Notes (Addendum)
Progress Note  Patient Name: Clifford Jones Date of Encounter: 12/15/2019  [facility] Cardiologist: Kate Sable, MD   Subjective   No chest pain.  Feels well this morning.  Wants to do some walking in the hallways.  Inpatient Medications    Scheduled Meds: . amLODipine  2.5 mg Oral Daily  . aspirin EC  81 mg Oral Daily  . carvedilol  6.25 mg Oral BID  . famotidine  20 mg Oral QHS  . ipratropium-albuterol  3 mL Nebulization TID  . lisinopril  10 mg Oral Daily  . pantoprazole  40 mg Oral Daily  . ranolazine  1,000 mg Oral BID  . rosuvastatin  40 mg Oral QHS   Continuous Infusions: . heparin 1,200 Units/hr (12/14/19 2039)   PRN Meds: acetaminophen, albuterol, nitroGLYCERIN   Vital Signs    Vitals:   12/15/19 0529 12/15/19 0833 12/15/19 0834 12/15/19 0935  BP:  (!) 161/80  131/74  Pulse:  77    Resp: 19     Temp: 98.1 F (36.7 C) 98.2 F (36.8 C)    TempSrc: Oral Oral    SpO2:  91% 95%   Weight: 106.6 kg     Height:        Intake/Output Summary (Last 24 hours) at 12/15/2019 1014 Last data filed at 12/15/2019 0942 Gross per 24 hour  Intake 280 ml  Output 275 ml  Net 5 ml   Last 3 Weights 12/15/2019 12/14/2019 09/03/2019  Weight (lbs) 235 lb 235 lb 14.4 oz 235 lb  Weight (kg) 106.595 kg 107.004 kg 106.595 kg      Telemetry    Normal sinus rhythm- Personally Reviewed  ECG    Normal sinus rhythm, nonspecific ST segment changes- Personally Reviewed  Physical Exam   GEN: No acute distress.   Neck: No JVD Cardiac: RRR, no murmurs, rubs, or gallops.  Respiratory: Clear to auscultation bilaterally. GI: Soft, nontender, non-distended  MS: No edema; No deformity. Neuro:  Nonfocal  Psych: Normal affect   Labs    High Sensitivity Troponin:   Recent Labs  Lab 12/14/19 1901 12/15/19 0617 12/15/19 0805  TROPONINIHS 2,228* 895* 768*      Chemistry Recent Labs  Lab 12/14/19 1901 12/15/19 0236  NA 138 139  K 4.0 4.6  CL 93* 95*  CO2 35*  36*  GLUCOSE 218* 172*  BUN 25* 24*  CREATININE 1.10 1.06  CALCIUM 9.1 9.0  PROT 6.7  --   ALBUMIN 3.4*  --   AST 19  --   ALT 16  --   ALKPHOS 55  --   BILITOT 0.7  --   GFRNONAA >60 >60  GFRAA >60 >60  ANIONGAP 10 8     Hematology Recent Labs  Lab 12/15/19 0805  WBC 11.1*  RBC 4.71  HGB 14.4  HCT 46.3  MCV 98.3  MCH 30.6  MCHC 31.1  RDW 12.5  PLT 178    BNP Recent Labs  Lab 12/14/19 1901  BNP 181.5*     DDimer No results for input(s): DDIMER in the last 168 hours.   Radiology    No results found.  Cardiac Studies   EF 55 to 60% in August 2020  Patient Profile     69 y.o. male with known CAD and NSTEMI.  Assessment & Plan    CAD/NSTEMI: Plan for cardiac catheterization on Monday.  Okay for him to do some slow-paced walking since he is pain-free.  Continue IV heparin,  high-dose statin and beta-blocker along with aspirin.  All questions about cardiac cath answered.    Former tobacco user: Quit smoking 6 months ago.  Hypertensive heart disease: Blood pressure controlled this morning on medical therapy.  Hyperlipidemia: COntinue high dose statin.  Review of the 2015 cath record shows that left common femoral artery was required for access due to difficulty with his right groin.  It was difficult to engage his [place] graft from the left groin and tourniquet was applied on his arm and nonselective angiography was performed.  Intervention of the right PLA was then done from the right radial approach the next day.  Here is the text from the procedure note in 2015: "Coronary and graft Anatomy: Right Dominant presumably  Left Main: Normal caliber vessel with Ostial 60% narrowing as well as roughly 40% distal narrowing as it bifurcates into the LAD and Circumflex. LAD: The residual LAD is moderate caliber with about 70% stenosis prior to takeoff of a small diagonal that has a roughly 70-80% proximal stenosis. One of this diagonal Obie Dredge is has another 70-80%  stenosis. Both of these if not necessarily PCI amenable. Beyond the diagonal and another septal perforator, the LAD is subtotally occluded with competitive flow from LIMA-LAD.  LIMA-LAD: Widely patent, somewhat tortuous graft that inserts into the mid LAD. There is minimal retrograde flow up the LAD, however antegrade flow reveals a very small caliber LAD with a now 90% segmental lesion just distal to the anastomosis. The LAD itself does reach down around the apex.  SVG-Diagonal: Known to be occluded from 2013, confirmed today  Left Circumflex: What amounts to be a relatively smaller moderate caliber vessel with diffuse plaque body very small proximal OM. The vessel is an essentially occluded distally with trivial flow indicating the presence of another obtuse marginal.  SVG-OM: Large-caliber, grossly irregular graft with an ostial DES stent (02/2012) has a roughly 80% in-stent restenosis in the proximal two thirds of the stent.  The distal stent that was treated with PTCA in 02/2012 is widely patent. Downstream vessel is significantly disease is noted below, it does not perfuse a large territory. ? Antegrade flow in the graft into the native circumflex shows a diffusely diseased degenerated native OM 2 with tandem 90 and 80% segmental lesions followed by a diffusely diseased distal vessel. ? Retrograde flow back up the AV groove circumflex leads to flow down a third marginal/LPL branch that is very small in caliber but without significant disease.    RCA: Known to be occluded  SVG-RCA: Nonselective imaging via subclavian angiography reveals that the graft itself is widely patent down to the target lesion, however flow in the target RCA was not adequately visualized.  After reviewing the initial angiography, the true culprit lesion was unclear. At this point since the [place] was not selectively engaged, the decision was made to abort the rest of procedure due to the length of fluoroscopy time and  contrast exposure.  He will likely require a return to the Cath Lab for potential Right Radial Access to engage the [place]."   For questions or updates, please contact [facility] Please consult [email or web address] for contact info under        Signed, Larae Grooms, MD  12/15/2019, 10:14 AM

## 2019-12-16 ENCOUNTER — Encounter (HOSPITAL_COMMUNITY)
Admission: AD | Disposition: A | Discharge status: Home / Self Care | Payer: Self-pay | Source: Other Acute Inpatient Hospital | Attending: Cardiology

## 2019-12-16 DIAGNOSIS — I5032 Chronic diastolic (congestive) heart failure: Secondary | ICD-10-CM

## 2019-12-16 DIAGNOSIS — I2510 Atherosclerotic heart disease of native coronary artery without angina pectoris: Secondary | ICD-10-CM

## 2019-12-16 DIAGNOSIS — I25810 Atherosclerosis of coronary artery bypass graft(s) without angina pectoris: Secondary | ICD-10-CM

## 2019-12-16 HISTORY — PX: RIGHT HEART CATH AND CORONARY/GRAFT ANGIOGRAPHY: CATH118265

## 2019-12-16 LAB — POCT I-STAT EG7
Acid-Base Excess: 8 mmol/L — ABNORMAL HIGH (ref 0.0–2.0)
Bicarbonate: 39.4 mmol/L — ABNORMAL HIGH (ref 20.0–28.0)
Calcium, Ion: 1.31 mmol/L (ref 1.15–1.40)
HCT: 45 % (ref 39.0–52.0)
Hemoglobin: 15.3 g/dL (ref 13.0–17.0)
O2 Saturation: 70 %
Potassium: 4.2 mmol/L (ref 3.5–5.1)
Sodium: 138 mmol/L (ref 135–145)
TCO2: 42 mmol/L — ABNORMAL HIGH (ref 22–32)
pCO2, Ven: 91.3 mmHg (ref 44.0–60.0)
pH, Ven: 7.244 — ABNORMAL LOW (ref 7.250–7.430)
pO2, Ven: 46 mmHg — ABNORMAL HIGH (ref 32.0–45.0)

## 2019-12-16 LAB — CBC
HCT: 44.5 % (ref 39.0–52.0)
Hemoglobin: 14.1 g/dL (ref 13.0–17.0)
MCH: 31.5 pg (ref 26.0–34.0)
MCHC: 31.7 g/dL (ref 30.0–36.0)
MCV: 99.3 fL (ref 80.0–100.0)
Platelets: 181 10*3/uL (ref 150–400)
RBC: 4.48 MIL/uL (ref 4.22–5.81)
RDW: 12.3 % (ref 11.5–15.5)
WBC: 9.7 10*3/uL (ref 4.0–10.5)
nRBC: 0 % (ref 0.0–0.2)

## 2019-12-16 LAB — POCT I-STAT 7, (LYTES, BLD GAS, ICA,H+H)
Acid-Base Excess: 7 mmol/L — ABNORMAL HIGH (ref 0.0–2.0)
Bicarbonate: 38.4 mmol/L — ABNORMAL HIGH (ref 20.0–28.0)
Calcium, Ion: 1.3 mmol/L (ref 1.15–1.40)
HCT: 45 % (ref 39.0–52.0)
Hemoglobin: 15.3 g/dL (ref 13.0–17.0)
O2 Saturation: 96 %
Potassium: 4.2 mmol/L (ref 3.5–5.1)
Sodium: 137 mmol/L (ref 135–145)
TCO2: 41 mmol/L — ABNORMAL HIGH (ref 22–32)
pCO2 arterial: 85.3 mmHg (ref 32.0–48.0)
pH, Arterial: 7.261 — ABNORMAL LOW (ref 7.350–7.450)
pO2, Arterial: 96 mmHg (ref 83.0–108.0)

## 2019-12-16 LAB — HEPARIN LEVEL (UNFRACTIONATED): Heparin Unfractionated: 0.37 IU/mL (ref 0.30–0.70)

## 2019-12-16 SURGERY — RIGHT HEART CATH AND CORONARY/GRAFT ANGIOGRAPHY
Anesthesia: LOCAL

## 2019-12-16 MED ORDER — LABETALOL HCL 5 MG/ML IV SOLN: 10.0000 mg | INTRAVENOUS | Status: AC | PRN

## 2019-12-16 MED ORDER — ISOSORBIDE MONONITRATE ER 30 MG PO TB24
30.0000 mg | ORAL_TABLET | Freq: Every day | ORAL | Status: DC
  Administered 2019-12-16 – 2019-12-17 (×2): 30 mg via ORAL
  Filled 2019-12-16 (×2): qty 1

## 2019-12-16 MED ORDER — WARFARIN - PHARMACIST DOSING INPATIENT: Freq: Every day | Status: DC

## 2019-12-16 MED ORDER — VERAPAMIL HCL 2.5 MG/ML IV SOLN
INTRAVENOUS | Status: AC
  Filled 2019-12-16: qty 2

## 2019-12-16 MED ORDER — HEPARIN SODIUM (PORCINE) 1000 UNIT/ML IJ SOLN
INTRAMUSCULAR | Status: DC | PRN
  Administered 2019-12-16: 5000 [IU] via INTRAVENOUS

## 2019-12-16 MED ORDER — COUMADIN BOOK
Freq: Once | Status: AC
  Filled 2019-12-16: qty 1

## 2019-12-16 MED ORDER — LIDOCAINE HCL (PF) 1 % IJ SOLN
INTRAMUSCULAR | Status: AC
  Filled 2019-12-16: qty 30

## 2019-12-16 MED ORDER — SODIUM CHLORIDE 0.9 % IV SOLN: 250.0000 mL | INTRAVENOUS | Status: DC | PRN

## 2019-12-16 MED ORDER — HYDRALAZINE HCL 20 MG/ML IJ SOLN: 10.0000 mg | INTRAMUSCULAR | Status: AC | PRN

## 2019-12-16 MED ORDER — SODIUM CHLORIDE 0.9% FLUSH: 3.0000 mL | INTRAVENOUS | Status: DC | PRN

## 2019-12-16 MED ORDER — HEPARIN (PORCINE) IN NACL 1000-0.9 UT/500ML-% IV SOLN
INTRAVENOUS | Status: DC | PRN
  Administered 2019-12-16 (×2): 500 mL

## 2019-12-16 MED ORDER — HEPARIN SODIUM (PORCINE) 1000 UNIT/ML IJ SOLN
INTRAMUSCULAR | Status: AC
  Filled 2019-12-16: qty 1

## 2019-12-16 MED ORDER — IOHEXOL 350 MG/ML SOLN
INTRAVENOUS | Status: AC
  Filled 2019-12-16: qty 1

## 2019-12-16 MED ORDER — IOHEXOL 350 MG/ML SOLN
INTRAVENOUS | Status: DC | PRN
  Administered 2019-12-16: 140 mL

## 2019-12-16 MED ORDER — HEPARIN (PORCINE) IN NACL 1000-0.9 UT/500ML-% IV SOLN
INTRAVENOUS | Status: AC
  Filled 2019-12-16: qty 1000

## 2019-12-16 MED ORDER — LIDOCAINE HCL (PF) 1 % IJ SOLN
INTRAMUSCULAR | Status: DC | PRN
  Administered 2019-12-16 (×3): 2 mL

## 2019-12-16 MED ORDER — HEPARIN (PORCINE) 25000 UT/250ML-% IV SOLN
1200.0000 [IU]/h | INTRAVENOUS | Status: DC
  Administered 2019-12-16: 1200 [IU]/h via INTRAVENOUS
  Filled 2019-12-16: qty 250

## 2019-12-16 MED ORDER — WARFARIN SODIUM 7.5 MG PO TABS
7.5000 mg | ORAL_TABLET | Freq: Once | ORAL | Status: AC
  Administered 2019-12-16: 7.5 mg via ORAL
  Filled 2019-12-16: qty 1

## 2019-12-16 MED ORDER — FUROSEMIDE 10 MG/ML IJ SOLN
40.0000 mg | Freq: Every day | INTRAMUSCULAR | Status: DC
  Administered 2019-12-16: 40 mg via INTRAVENOUS
  Filled 2019-12-16: qty 4

## 2019-12-16 MED ORDER — VERAPAMIL HCL 2.5 MG/ML IV SOLN
INTRAVENOUS | Status: DC | PRN
  Administered 2019-12-16: 10 mL via INTRA_ARTERIAL

## 2019-12-16 MED ORDER — SODIUM CHLORIDE 0.9% FLUSH
3.0000 mL | Freq: Two times a day (BID) | INTRAVENOUS | Status: DC
  Administered 2019-12-16 – 2019-12-17 (×3): 3 mL via INTRAVENOUS

## 2019-12-16 SURGICAL SUPPLY — 15 items
CATH 5FR JL3.5 JR4 ANG PIG MP (CATHETERS) ×1 IMPLANT
CATH INFINITI 5 FR IM (CATHETERS) ×1 IMPLANT
CATH INFINITI 5 FR LCB (CATHETERS) ×1 IMPLANT
CATH SWAN DBL LUMAN 5F 110 (CATHETERS) IMPLANT
CATH SWAN GANZ 7F STRAIGHT (CATHETERS) ×1 IMPLANT
DEVICE RAD COMP TR BAND LRG (VASCULAR PRODUCTS) ×2 IMPLANT
GLIDESHEATH SLEND SS 6F .021 (SHEATH) ×2 IMPLANT
GLIDESHEATH SLENDER 7FR .021G (SHEATH) ×1 IMPLANT
GUIDEWIRE INQWIRE 1.5J.035X260 (WIRE) IMPLANT
INQWIRE 1.5J .035X260CM (WIRE) ×2
KIT HEART LEFT (KITS) ×2 IMPLANT
PACK CARDIAC CATHETERIZATION (CUSTOM PROCEDURE TRAY) ×2 IMPLANT
SHEATH PROBE COVER 6X72 (BAG) ×1 IMPLANT
TRANSDUCER W/STOPCOCK (MISCELLANEOUS) ×3 IMPLANT
TUBING CIL FLEX 10 FLL-RA (TUBING) ×2 IMPLANT

## 2019-12-16 NOTE — Progress Notes (Addendum)
ANTICOAGULATION CONSULT NOTE - Follow-Up Consult  Pharmacy Consult for Heparin>warfarin Indication: LV thrombus  No Known Allergies  Patient Measurements: Height: 6' (182.9 cm) Weight: 105.9 kg (233 lb 7.5 oz) IBW/kg (Calculated) : 77.6 Heparin Dosing Weight: 100kg  Vital Signs: Temp: 98.5 F (36.9 C) (06/28 0418) Temp Source: Oral (06/28 0418) BP: 147/82 (06/28 0855) Pulse Rate: 81 (06/28 0855)  Labs: Recent Labs    12/14/19 1901 12/14/19 2005 12/15/19 0236 12/15/19 0617 12/15/19 0805 12/16/19 0447  HGB  --   --   --   --  14.4 14.1  HCT  --   --   --   --  46.3 44.5  PLT  --   --   --   --  178 181  HEPARINUNFRC  --  0.42  --   --  0.42 0.37  CREATININE 1.10  --  1.06  --   --   --   TROPONINIHS 2,228*  --   --  895* 768*  --     Estimated Creatinine Clearance: 85 mL/min (by C-G formula based on SCr of 1.06 mg/dL).   Medical History: Past Medical History:  Diagnosis Date   Ampullary adenoma    Arthritis    Reported by patient   Cancer [facility])    Reported by patient   Claustrophobia    Closed fracture of right distal tibia    COPD (chronic obstructive pulmonary disease) ([place])    Reported by patient   Coronary artery disease    a. 1999 Inf MI;  b. 2003 CABGx6;  c. 2008 NSTEMI, occluded SVG to marginal and diagonal system. 2 DES placed to marginal system;  c. NSTEMI - 02/2012 Xience Xpedition DES to oS-OM and POBA to mid lesion;  c. 10/2013 NSTEMI/PCI: VG->OM 80 ISR, native OM 80/90 small, LIMA->LAD ok w/ 90 dLAD, D1 80, RIMA->RPL ok w/ 95 in RPL (2.5x14 Resolute DES), nl EF.   Dyspnea    Reported by patient   GERD (gastroesophageal reflux disease)    Hyperlipidemia    Hypertension    Myocardial infarction ([place])    X 6   Pneumonia    Presence of permanent cardiac pacemaker    S/P AAA repair    Tobacco abuse    Wears dentures    Wears glasses      Assessment: 69 yo M with Hx CAD, CABG 2003, DES 2008, 2013, 2015, admitted to OSH with  CP and transferred to [facility] for cardiac evaluation.   Heparin level remains therapeutic on 1200 units/hr this morning prior to cath.   Cath report still pending, however heparin to restart this afternoon for possible LV thrombus. Warfarin also has been added to start this afternoon. Will provide education prior to discharge.   Goal of Therapy:  Heparin level 0.3-0.7 units/ml Monitor platelets by anticoagulation protocol: Yes   Plan:  Restart heparin drip at 1200 units/hr post TR band removal Daily heparin level, INR and CBC  Warfarin 7.5mg  tonight  Educational psychologist can be found on [email or web address] listed under [place].  Erin Hearing PharmD., BCPS Clinical Pharmacist 12/16/2019 8:59 AM

## 2019-12-16 NOTE — Brief Op Note (Signed)
BRIEF CARDIAC CATHETERIZATION NOTE  12/16/2019  8:56 AM  PATIENT:  Clifford Jones  69 y.o. male  PRE-OPERATIVE DIAGNOSIS:  NSTEMI  POST-OPERATIVE DIAGNOSIS:  NSTEMI  PROCEDURE:  Procedure(s): RIGHT HEART CATH AND CORONARY/GRAFT ANGIOGRAPHY (N/A)  SURGEON:  Surgeon(s) and Role:    * Verlaine Embry, Harrell Gave, MD - Primary  FINDINGS: 1. Severe native coronary artery disease, including 80% distal LMCA stenosis with heavy calcification involving the ostium of the LCx where there is 90% stenosis, chronic total occlusions of the mid and distal LAD, 80% D1 stenosis as well as chronic total occlusions of ramus intermedius, distal LCx, and proximal RCA. 2. Widely patent LIMA-LAD (distal LAD is occluded beyond LIMA anastomosis) and RIMA-distal RCA. 3. Chronically occluded sequential SVG-D1-D2, and SVG-OM.  SVG to ramus could not be engaged but is likely occluded given non-visualization with ascending aortogram and collateral filling of the ramus. 4. Mildly elevated left heart filling pressure (PCWP 20-25 mmHg). 5. Moderately to severely elevated right heart filling pressure (RA 15 mmHg). 6. Moderate pulmonary hypertension (mean PAP 36 mmHg). 7. Normal [place] cardiac output/index.  RECOMMENDATIONS: 1. Gentle diuresis. 2. Optimize antianginal therapy; I will add isosorbide mononitrate and continue current doses of amlodipine and ranolazine. 3. If the patient has refractory angina, only interventional target would be distal LMCA, though this would be very challenging given heavy calcification and severe stenosis of ostial LCx.  PCI of D1 would also need to be considered at that time.  However, I am not sure that benefits of the procedure would outweigh potential risks at this time. 4. Restart heparin infusion 2 hours after TR band removal.  Consider transition to warfarin if concern for apical thrombus persists (in addition to aspirin).  Nelva Bush, MD [facility]

## 2019-12-16 NOTE — Interval H&P Note (Signed)
History and Physical Interval Note:  12/16/2019 7:26 AM  [name]  has presented today for surgery, with the diagnosis of NSTEMI.  The various methods of treatment have been discussed with the patient and family. After consideration of risks, benefits and other options for treatment, the patient has consented to  Procedure(s): LEFT HEART CATH AND CORS/GRAFTS ANGIOGRAPHY (N/A) and right heart catheterization as a surgical intervention.  The patient's history has been reviewed, patient examined, no change in status, stable for surgery.  I have reviewed the patient's chart and labs.  Questions were answered to the patient's satisfaction.    Cath Lab Visit (complete for each Cath Lab visit)  Clinical Evaluation Leading to the Procedure:   ACS: Yes.    Non-ACS:    N/A  Arsal Tappan

## 2019-12-16 NOTE — Progress Notes (Signed)
Progress Note  Patient Name: Clifford Jones Date of Encounter: 12/16/2019  [facility] Cardiologist: Kate Sable, MD   Subjective   No CP or dyspnea  Inpatient Medications    Scheduled Meds: . amLODipine  2.5 mg Oral Daily  . aspirin EC  81 mg Oral Daily  . carvedilol  6.25 mg Oral BID  . famotidine  20 mg Oral QHS  . furosemide  40 mg Intravenous Daily  . ipratropium-albuterol  3 mL Nebulization TID  . isosorbide mononitrate  30 mg Oral Daily  . lisinopril  10 mg Oral Daily  . pantoprazole  40 mg Oral Daily  . ranolazine  1,000 mg Oral BID  . rosuvastatin  40 mg Oral QHS  . sodium chloride flush  3 mL Intravenous Q12H  . sodium chloride flush  3 mL Intravenous Q12H   Continuous Infusions: . sodium chloride     PRN Meds: sodium chloride, acetaminophen, albuterol, hydrALAZINE, labetalol, nitroGLYCERIN, sodium chloride flush   Vital Signs    Vitals:   12/16/19 0840 12/16/19 0845 12/16/19 0850 12/16/19 0855  BP: (!) 151/84 (!) 146/83 (!) 151/78 (!) 147/82  Pulse: 79 73 69 81  Resp: 18 17 17 18   Temp:      TempSrc:      SpO2: 94% 90% 92% 94%  Weight:      Height:        Intake/Output Summary (Last 24 hours) at 12/16/2019 0943 Last data filed at 12/16/2019 0731 Gross per 24 hour  Intake 483 ml  Output 725 ml  Net -242 ml   Last 3 Weights 12/16/2019 12/15/2019 12/14/2019  Weight (lbs) 233 lb 7.5 oz 235 lb 235 lb 14.4 oz  Weight (kg) 105.9 kg 106.595 kg 107.004 kg      Telemetry    Sinus- Personally Reviewed  Physical Exam   GEN: No acute distress.   Neck: No JVD Cardiac: RRR, no murmurs, rubs, or gallops.  Respiratory: Clear to auscultation bilaterally. GI: Soft, nontender, non-distended  MS: No edema Neuro:  Nonfocal  Psych: Normal affect   Labs    High Sensitivity Troponin:   Recent Labs  Lab 12/14/19 1901 12/15/19 0617 12/15/19 0805  TROPONINIHS 2,228* 895* 768*      Chemistry Recent Labs  Lab 12/14/19 1901 12/15/19 0236    NA 138 139  K 4.0 4.6  CL 93* 95*  CO2 35* 36*  GLUCOSE 218* 172*  BUN 25* 24*  CREATININE 1.10 1.06  CALCIUM 9.1 9.0  PROT 6.7  --   ALBUMIN 3.4*  --   AST 19  --   ALT 16  --   ALKPHOS 55  --   BILITOT 0.7  --   GFRNONAA >60 >60  GFRAA >60 >60  ANIONGAP 10 8     Hematology Recent Labs  Lab 12/15/19 0805 12/16/19 0447  WBC 11.1* 9.7  RBC 4.71 4.48  HGB 14.4 14.1  HCT 46.3 44.5  MCV 98.3 99.3  MCH 30.6 31.5  MCHC 31.1 31.7  RDW 12.5 12.3  PLT 178 181    BNP Recent Labs  Lab 12/14/19 1901  BNP 181.5*    Radiology    ECHOCARDIOGRAM COMPLETE  Result Date: 12/15/2019    ECHOCARDIOGRAM REPORT   Patient Name:   Clifford Jones Date of Exam: 12/15/2019 Medical Rec #:  [id number]      Height:       72.0 in Accession #:    [id number]  Weight:       235.0 lb Date of Birth:  03-16-1951      BSA:          2.282 m Patient Age:    69 years       BP:           131/74 mmHg Patient Gender: M              HR:           77 bpm. Exam Location:  Inpatient Procedure: 2D Echo, Cardiac Doppler, Color Doppler and Intracardiac            Opacification Agent Indications:    R07.9 Chest pain  History:        Patient has prior history of Echocardiogram examinations, most                 recent 01/30/2019. Previous Myocardial Infarction and CAD, Prior                 CABG; Risk Factors:Hypertension, Dyslipidemia and Former Smoker.                 S/p AAA repair.  Sonographer:    Clayton Lefort RDCS (AE) Referring Phys: [id number] [place]  1. Possible small apical left ventricular thrombus (6 x 6 mm). Left ventricular ejection fraction, by estimation, is 50 to 55%. The left ventricle has low normal function. The left ventricle demonstrates regional wall motion abnormalities (see scoring diagram/findings for description). There is mild concentric left ventricular hypertrophy. Left ventricular diastolic parameters are consistent with Grade II diastolic dysfunction (pseudonormalization).  Elevated left atrial pressure. There is mild dyskinesis of the left ventricular, entire apical segment.  2. Right ventricular systolic function is normal. The right ventricular size is normal. There is normal pulmonary artery systolic pressure. The estimated right ventricular systolic pressure is 73.7 mmHg.  3. The mitral valve is normal in structure. No evidence of mitral valve regurgitation.  4. The aortic valve is tricuspid. Aortic valve regurgitation is not visualized. Mild to moderate aortic valve sclerosis/calcification is present, without any evidence of aortic stenosis.  5. The inferior vena cava is normal in size with greater than 50% respiratory variability, suggesting right atrial pressure of 3 mmHg. Comparison(s): A prior study was performed on 01/30/2019. Prior images reviewed side by side. Although not reported on the previous study (performed without Definity), there is evidence of an apical wall motion abnormality on that study as well (cannot evaluate for LV apical thrombus on prior study). Conclusion(s)/Recommendation(s): The patient is currently receiving IV heparin. FINDINGS  Left Ventricle: Possible small apical left ventricular thrombus (6 x 6 mm). Left ventricular ejection fraction, by estimation, is 50 to 55%. The left ventricle has low normal function. The left ventricle demonstrates regional wall motion abnormalities. Mild dyskinesis of the left ventricular, entire apical segment. The left ventricular internal cavity size was normal in size. There is mild concentric left ventricular hypertrophy. Left ventricular diastolic parameters are consistent with Grade II diastolic dysfunction (pseudonormalization). Elevated left atrial pressure. Right Ventricle: The right ventricular size is normal. No increase in right ventricular wall thickness. Right ventricular systolic function is normal. There is normal pulmonary artery systolic pressure. The tricuspid regurgitant velocity is 2.53 m/s, and   with an assumed right atrial pressure of 3 mmHg, the estimated right ventricular systolic pressure is 10.6 mmHg. Left Atrium: Left atrial size was normal in size. Right Atrium: Right atrial size was normal in size.  Pericardium: There is no evidence of pericardial effusion. Mitral Valve: The mitral valve is normal in structure. Mild mitral annular calcification. No evidence of mitral valve regurgitation. MV peak gradient, 4.8 mmHg. The mean mitral valve gradient is 2.0 mmHg. Tricuspid Valve: The tricuspid valve is normal in structure. Tricuspid valve regurgitation is mild. Aortic Valve: The aortic valve is tricuspid. . There is mild thickening and mild calcification of the aortic valve. Aortic valve regurgitation is not visualized. Mild to moderate aortic valve sclerosis/calcification is present, without any evidence of aortic stenosis. There is mild thickening of the aortic valve. There is mild calcification of the aortic valve. Aortic valve mean gradient measures 5.0 mmHg. Aortic valve peak gradient measures 10.4 mmHg. Aortic valve area, by VTI measures 2.58 cm. Pulmonic Valve: The pulmonic valve was not well visualized. Pulmonic valve regurgitation is not visualized. Aorta: The aortic root is normal in size and structure. Venous: The inferior vena cava is normal in size with greater than 50% respiratory variability, suggesting right atrial pressure of 3 mmHg. IAS/Shunts: No atrial level shunt detected by color flow Doppler.  LEFT VENTRICLE PLAX 2D LVIDd:         5.30 cm  Diastology LVIDs:         3.80 cm  LV e' lateral:   8.43 cm/s LV PW:         1.60 cm  LV E/e' lateral: 12.3 LV IVS:        1.30 cm  LV e' medial:    5.43 cm/s LVOT diam:     2.30 cm  LV E/e' medial:  19.1 LV SV:         94 LV SV Index:   41 LVOT Area:     4.15 cm  RIGHT VENTRICLE            IVC RV Basal diam:  2.60 cm    IVC diam: 2.50 cm RV S prime:     6.68 cm/s TAPSE (M-mode): 1.2 cm LEFT ATRIUM             Index       RIGHT ATRIUM            Index LA diam:        3.80 cm 1.67 cm/m  RA Area:     18.60 cm LA Vol (A2C):   59.9 ml 26.25 ml/m RA Volume:   49.00 ml  21.48 ml/m LA Vol (A4C):   57.6 ml 25.24 ml/m LA Biplane Vol: 60.5 ml 26.52 ml/m  AORTIC VALVE AV Area (Vmax):    3.02 cm AV Area (Vmean):   3.04 cm AV Area (VTI):     2.58 cm AV Vmax:           161.00 cm/s AV Vmean:          108.000 cm/s AV VTI:            0.364 m AV Peak Grad:      10.4 mmHg AV Mean Grad:      5.0 mmHg LVOT Vmax:         117.00 cm/s LVOT Vmean:        78.900 cm/s LVOT VTI:          0.226 m LVOT/AV VTI ratio: 0.62  AORTA Ao Root diam: 3.60 cm MITRAL VALVE                TRICUSPID VALVE MV Area (PHT): 3.66 cm     TR Peak grad:  25.6 mmHg MV Peak grad:  4.8 mmHg     TR Vmax:        253.00 cm/s MV Mean grad:  2.0 mmHg MV Vmax:       1.09 m/s     SHUNTS MV Vmean:      60.6 cm/s    Systemic VTI:  0.23 m MV Decel Time: 208 msec     Systemic Diam: 2.30 cm MV E velocity: 103.50 cm/s MV A velocity: 96.30 cm/s MV E/A ratio:  1.07 Mihai Croitoru MD Electronically signed by Sanda Klein MD Signature Date/Time: 12/15/2019/4:47:58 PM    Final     Patient Profile     69 y.o. male with coronary artery disease admitted with non-ST elevation myocardial infarction.  Also with hypertension and hyperlipidemia.  Echocardiogram shows ejection fraction 50 to 55% with possible apical thrombus, mild left ventricular hypertrophy, grade 2 diastolic dysfunction.  Cardiac catheterization revealed severe native coronary artery disease, patent LIMA to the LAD (distal LAD occluded beyond LIMA anastomosis) and patent RIMA to the distal right coronary artery.  Saphenous vein graft to D1/D2, saphenous vein graft to OM and saphenous vein graft to ramus occluded.  Pulmonary capillary wedge pressure 20-25 and moderate pulmonary hypertension.  Medical therapy recommended.  Assessment & Plan    1 non-ST elevation myocardial infarction-cardiac catheterization results noted and outlined above.  Plan  is initially for medical therapy.  We will treat with aspirin 81 mg daily, statin and beta-blocker.  Add isosorbide 30 mg daily.  2 apical thrombus-noted on echocardiogram with contrast.  Continue IV heparin.  We will begin Coumadin with goal INR 2-3.  May need to arrange home Lovenox until INR therapeutic.  3 coronary artery disease-continue aspirin and statin.  4 chronic diastolic congestive heart failure-pulmonary capillary wedge pressure elevated.  Continue Lasix 40 mg IV daily and follow renal function.  Will transition to oral Lasix at discharge.  5 hyperlipidemia-continue statin.  6 hypertension-continue present medications and follow.  7 history of COPD  For questions or updates, please contact [facility] Please consult [email or web address] for contact info under        Signed, Kirk Ruths, MD  12/16/2019, 9:43 AM

## 2019-12-17 ENCOUNTER — Other Ambulatory Visit: Payer: Self-pay | Admitting: Physician Assistant

## 2019-12-17 ENCOUNTER — Encounter (HOSPITAL_COMMUNITY): Payer: Self-pay | Admitting: Internal Medicine

## 2019-12-17 DIAGNOSIS — Z79899 Other long term (current) drug therapy: Secondary | ICD-10-CM

## 2019-12-17 DIAGNOSIS — I513 Intracardiac thrombosis, not elsewhere classified: Secondary | ICD-10-CM

## 2019-12-17 LAB — BASIC METABOLIC PANEL
Anion gap: 9 (ref 5–15)
BUN: 25 mg/dL — ABNORMAL HIGH (ref 8–23)
CO2: 35 mmol/L — ABNORMAL HIGH (ref 22–32)
Calcium: 9.1 mg/dL (ref 8.9–10.3)
Chloride: 91 mmol/L — ABNORMAL LOW (ref 98–111)
Creatinine, Ser: 0.99 mg/dL (ref 0.61–1.24)
GFR calc Af Amer: >60 mL/min (ref >60)
GFR calc non Af Amer: >60 mL/min (ref >60)
Glucose, Bld: 138 mg/dL — ABNORMAL HIGH (ref 70–99)
Potassium: 4 mmol/L (ref 3.5–5.1)
Sodium: 135 mmol/L (ref 135–145)

## 2019-12-17 LAB — CBC
HCT: 43 % (ref 39.0–52.0)
Hemoglobin: 13.7 g/dL (ref 13.0–17.0)
MCH: 31.4 pg (ref 26.0–34.0)
MCHC: 31.9 g/dL (ref 30.0–36.0)
MCV: 98.6 fL (ref 80.0–100.0)
Platelets: 148 10*3/uL — ABNORMAL LOW (ref 150–400)
RBC: 4.36 MIL/uL (ref 4.22–5.81)
RDW: 12.1 % (ref 11.5–15.5)
WBC: 10.2 10*3/uL (ref 4.0–10.5)
nRBC: 0 % (ref 0.0–0.2)

## 2019-12-17 LAB — PROTIME-INR
INR: 1.2 (ref 0.8–1.2)
Prothrombin Time: 15 seconds (ref 11.4–15.2)

## 2019-12-17 LAB — HEPARIN LEVEL (UNFRACTIONATED): Heparin Unfractionated: 0.4 IU/mL (ref 0.30–0.70)

## 2019-12-17 MED ORDER — FUROSEMIDE 20 MG PO TABS
20.0000 mg | ORAL_TABLET | Freq: Every day | ORAL | Status: DC
  Administered 2019-12-17: 20 mg via ORAL
  Filled 2019-12-17: qty 1

## 2019-12-17 MED ORDER — WARFARIN SODIUM 7.5 MG PO TABS: 7.5000 mg | ORAL_TABLET | Freq: Once | ORAL | 0 refills | Status: DC

## 2019-12-17 MED ORDER — LISINOPRIL 2.5 MG PO TABS
2.5000 mg | ORAL_TABLET | Freq: Every day | ORAL | Status: DC
  Administered 2019-12-17: 2.5 mg via ORAL
  Filled 2019-12-17: qty 1

## 2019-12-17 MED ORDER — ENOXAPARIN SODIUM 100 MG/ML ~~LOC~~ SOLN
100.0000 mg | Freq: Once | SUBCUTANEOUS | Status: AC
  Administered 2019-12-17: 100 mg via SUBCUTANEOUS
  Filled 2019-12-17: qty 1

## 2019-12-17 MED ORDER — ENOXAPARIN SODIUM 100 MG/ML ~~LOC~~ SOLN
1.0000 mg/kg | Freq: Two times a day (BID) | SUBCUTANEOUS | 0 refills | Status: DC
Start: 2019-12-17 — End: 2019-12-17

## 2019-12-17 MED ORDER — ROSUVASTATIN CALCIUM 40 MG PO TABS: 40.0000 mg | ORAL_TABLET | Freq: Every day | ORAL | 1 refills | Status: AC

## 2019-12-17 MED ORDER — ENOXAPARIN SODIUM 100 MG/ML ~~LOC~~ SOLN
100.0000 mg | Freq: Two times a day (BID) | SUBCUTANEOUS | 0 refills | Status: DC
Start: 2019-12-17 — End: 2019-12-24

## 2019-12-17 MED ORDER — FUROSEMIDE 20 MG PO TABS: 20.0000 mg | ORAL_TABLET | Freq: Every day | ORAL | 2 refills | Status: DC

## 2019-12-17 MED ORDER — LISINOPRIL 2.5 MG PO TABS: 2.5000 mg | ORAL_TABLET | Freq: Every day | ORAL | 1 refills | Status: DC

## 2019-12-17 MED ORDER — CARVEDILOL 3.125 MG PO TABS: 3.1250 mg | ORAL_TABLET | Freq: Two times a day (BID) | ORAL | 3 refills | Status: DC

## 2019-12-17 MED ORDER — ENOXAPARIN SODIUM 100 MG/ML ~~LOC~~ SOLN
100.0000 mg | Freq: Two times a day (BID) | SUBCUTANEOUS | 0 refills | Status: DC
Start: 2019-12-17 — End: 2019-12-17

## 2019-12-17 MED ORDER — CARVEDILOL 3.125 MG PO TABS
3.1250 mg | ORAL_TABLET | Freq: Two times a day (BID) | ORAL | Status: DC
  Administered 2019-12-17: 3.125 mg via ORAL
  Filled 2019-12-17: qty 1

## 2019-12-17 MED ORDER — WARFARIN SODIUM 7.5 MG PO TABS: 7.5000 mg | ORAL_TABLET | Freq: Once | ORAL | Status: DC

## 2019-12-17 MED ORDER — ISOSORBIDE MONONITRATE ER 30 MG PO TB24: 30.0000 mg | ORAL_TABLET | Freq: Every day | ORAL | 1 refills | Status: DC

## 2019-12-17 MED FILL — ENOXAPARIN SODIUM 100 MG/ML: 100 | 16 days supply | Qty: 20 | Fill #0

## 2019-12-17 NOTE — Discharge Instructions (Signed)
Information on my medicine - Coumadin   (Warfarin)  Why was Coumadin prescribed for you? Coumadin was prescribed for you because you have a blood clot or a medical condition that can cause an increased risk of forming blood clots. Blood clots can cause serious health problems by blocking the flow of blood to the heart, lung, or brain. Coumadin can prevent harmful blood clots from forming. As a reminder your indication for Coumadin is:   Clot in the heart  What test will check on my response to Coumadin? While on Coumadin (warfarin) you will need to have an INR test regularly to ensure that your dose is keeping you in the desired range. The INR (international normalized ratio) number is calculated from the result of the laboratory test called prothrombin time (PT).  If an INR APPOINTMENT HAS NOT ALREADY BEEN MADE FOR YOU please schedule an appointment to have this lab work done by your health care provider within 7 days. Your INR goal is usually a number between:  2 to 3 or your provider may give you a more narrow range like 2-2.5.  Ask your health care provider during an office visit what your goal INR is.  What  do you need to  know  About  COUMADIN? Take Coumadin (warfarin) exactly as prescribed by your healthcare provider about the same time each day.  DO NOT stop taking without talking to the doctor who prescribed the medication.  Stopping without other blood clot prevention medication to take the place of Coumadin may increase your risk of developing a new clot or stroke.  Get refills before you run out.  What do you do if you miss a dose? If you miss a dose, take it as soon as you remember on the same day then continue your regularly scheduled regimen the next day.  Do not take two doses of Coumadin at the same time.  Important Safety Information A possible side effect of Coumadin (Warfarin) is an increased risk of bleeding. You should call your healthcare provider right away if you  experience any of the following: ? Bleeding from an injury or your nose that does not stop. ? Unusual colored urine (red or dark brown) or unusual colored stools (red or black). ? Unusual bruising for unknown reasons. ? A serious fall or if you hit your head (even if there is no bleeding).  Some foods or medicines interact with Coumadin (warfarin) and might alter your response to warfarin. To help avoid this: ? Eat a balanced diet, maintaining a consistent amount of Vitamin K. ? Notify your provider about major diet changes you plan to make. ? Avoid alcohol or limit your intake to 1 drink for women and 2 drinks for men per day. (1 drink is 5 oz. wine, 12 oz. beer, or 1.5 oz. liquor.)  Make sure that ANY health care provider who prescribes medication for you knows that you are taking Coumadin (warfarin).  Also make sure the healthcare provider who is monitoring your Coumadin knows when you have started a new medication including herbals and non-prescription products.  Coumadin (Warfarin)  Major Drug Interactions  Increased Warfarin Effect Decreased Warfarin Effect  Alcohol (large quantities) Antibiotics (esp. Septra/Bactrim, Flagyl, Cipro) Amiodarone (Cordarone) Aspirin (ASA) Cimetidine (Tagamet) Megestrol (Megace) NSAIDs (ibuprofen, naproxen, etc.) Piroxicam (Feldene) Propafenone (Rythmol SR) Propranolol (Inderal) Isoniazid (INH) Posaconazole (Noxafil) Barbiturates (Phenobarbital) Carbamazepine (Tegretol) Chlordiazepoxide (Librium) Cholestyramine (Questran) Griseofulvin Oral Contraceptives Rifampin Sucralfate (Carafate) Vitamin K   Coumadin (Warfarin) Major Herbal Interactions  Increased Warfarin Effect Decreased Warfarin Effect  Garlic Ginseng Ginkgo biloba Coenzyme Q10 Green tea St. Johns wort    Coumadin (Warfarin) FOOD Interactions  Eat a consistent number of servings per week of foods HIGH in Vitamin K (1 serving =  cup)  Collards (cooked, or boiled &  drained) Kale (cooked, or boiled & drained) Mustard greens (cooked, or boiled & drained) Parsley *serving size only =  cup Spinach (cooked, or boiled & drained) Swiss chard (cooked, or boiled & drained) Turnip greens (cooked, or boiled & drained)  Eat a consistent number of servings per week of foods MEDIUM-HIGH in Vitamin K (1 serving = 1 cup)  Asparagus (cooked, or boiled & drained) Broccoli (cooked, boiled & drained, or raw & chopped) Brussel sprouts (cooked, or boiled & drained) *serving size only =  cup Lettuce, raw (green leaf, endive, romaine) Spinach, raw Turnip greens, raw & chopped   These websites have more information on Coumadin (warfarin):  FailFactory.se; VeganReport.com.au;   ============================================================  Enoxaparin injection What is this medicine? ENOXAPARIN (ee nox a PA rin) is used after knee, hip, or abdominal surgeries to prevent blood clotting. It is also used to treat existing blood clots in the lungs or in the veins. This medicine may be used for other purposes; ask your health care provider or pharmacist if you have questions. COMMON BRAND NAME(S): Lovenox  What should I tell my health care provider before I take this medicine? They need to know if you have any of these conditions:  bleeding disorders, hemorrhage, or hemophilia  infection of the heart or heart valves  kidney or liver disease  previous stroke  prosthetic heart valve  recent surgery or delivery of a baby  ulcer in the stomach or intestine, diverticulitis, or other bowel disease  an unusual or allergic reaction to enoxaparin, heparin, pork or pork products, other medicines, foods, dyes, or preservatives  pregnant or trying to get pregnant  breast-feeding  How should I use this medicine? This medicine is for injection under the skin. It is usually given by a health-care professional. You or a family member may be trained on  how to give the injections. If you are to give yourself injections, make sure you understand how to use the syringe, measure the dose if necessary, and give the injection. To avoid bruising, do not rub the site where this medicine has been injected. Do not take your medicine more often than directed. Do not stop taking except on the advice of your doctor or health care professional. Make sure you receive a puncture-resistant container to dispose of the needles and syringes once you have finished with them. Do not reuse these items. Return the container to your doctor or health care professional for proper disposal. Talk to your pediatrician regarding the use of this medicine in children. Special care may be needed. Overdosage: If you think you have taken too much of this medicine contact a poison control center or emergency room at once. NOTE: This medicine is only for you. Do not share this medicine with others.  See this Video for how to administer Enoxaparin: https://www.lovenox.com/patient-self-injection-video   What if I miss a dose? If you miss a dose, take it as soon as you can. If it is almost time for your next dose, take only that dose. Do not take double or extra doses.  What may interact with this medicine?  aspirin and aspirin-like medicines  certain medicines that treat or prevent blood clots  dipyridamole  NSAIDs, medicines for pain and inflammation, like ibuprofen or naproxen This list may not describe all possible interactions. Give your health care provider a list of all the medicines, herbs, non-prescription drugs, or dietary supplements you use. Also tell them if you smoke, drink alcohol, or use illegal drugs. Some items may interact with your medicine.  What should I watch for while using this medicine? Visit your healthcare professional for regular checks on your progress. You may need blood work done while you are taking this medicine. Your condition will be monitored  carefully while you are receiving this medicine. It is important not to miss any appointments. If you are going to need surgery or other procedure, tell your healthcare professional that you are using this medicine. Using this medicine for a long time may weaken your bones and increase the risk of bone fractures. Avoid sports and activities that might cause injury while you are using this medicine. Severe falls or injuries can cause unseen bleeding. Be careful when using sharp tools or knives. Consider using an Copy. Take special care brushing or flossing your teeth. Report any injuries, bruising, or red spots on the skin to your healthcare professional. Wear a medical ID bracelet or chain. Carry a card that describes your disease and details of your medicine and dosage times.  What side effects may I notice from receiving this medicine? Side effects that you should report to your doctor or health care professional as soon as possible:  allergic reactions like skin rash, itching or hives, swelling of the face, lips, or tongue  bone pain  signs and symptoms of bleeding such as bloody or black, tarry stools; red or dark-brown urine; spitting up blood or brown material that looks like coffee grounds; red spots on the skin; unusual bruising or bleeding from the eye, gums, or nose  signs and symptoms of a blood clot such as chest pain; shortness of breath; pain, swelling, or warmth in the leg  signs and symptoms of a stroke such as changes in vision; confusion; trouble speaking or understanding; severe headaches; sudden numbness or weakness of the face, arm or leg; trouble walking; dizziness; loss of coordination Side effects that usually do not require medical attention (report to your doctor or health care professional if they continue or are bothersome):  hair loss  pain, redness, or irritation at site where injected This list may not describe all possible side effects. Call your  doctor for medical advice about side effects. You may report side effects to FDA at 1-800-FDA-1088.  Where should I keep my medicine? Keep out of the reach of children. Store at room temperature between 15 and 30 degrees C (59 and 86 degrees F). Do not freeze. If your injections have been specially prepared, you may need to store them in the refrigerator. Ask your pharmacist. Throw away any unused medicine after the expiration date.   NOTE: This sheet is a summary. It may not cover all possible information. If you have questions about this medicine, talk to your doctor, pharmacist, or health care provider.  2020 Elsevier/Gold Standard (2017-06-01 11:25:34)   =====================================================  Anticoagulant Injection Instructions Using a Prefilled Syringe    Injectable blood thinners (anticoagulants) are medicines that help prevent blood clots from developing. Two common injectable anticoagulant medicines that are used are fondaparinux and enoxaparin. Injectable anticoagulant medicines are given with a single-use syringe that already has medicine inside of it (prefilled syringe). You inject the medicine through a needle into the layer  of fat and tissue between skin and muscle (subcutaneous) in your belly. Before your first injection, your health care provider will instruct you on how to take your anticoagulant medicine at home. Also read the medication guide or package insert that came with the prefilled syringe. Follow directions from the guide about how to prepare and give the injection.  What are the risks? Generally, self-injection of anticoagulant medicine is safe. However, mild problems can occur, including mild bleeding, itching, or rash at the injection site. Other risks may include:  Bleeding and bruising.  A low platelet count (thrombocytopenia).  An allergic reaction to the medication.  Liver damage.  A low red blood cell count (anemia).  Supplies  needed: To inject the medicine, you will need:  Alcohol wipes.  A prefilled syringe with needle.  A container for syringe disposal. This may be a puncture-proof sharps container or a hard-sided plastic container with a cover, such as an empty laundry detergent bottle.  How to inject anticoagulant medicine 1. Wash your hands with soap and water. If soap and water are not available, use alcohol-based hand sanitizer. 2. Locate the site on your belly where the medicine should be injected. Avoid the area within 2 inches (5 cm) of your navel (umbilicus). 3. Use an alcohol wipe to clean the site where you will be injecting the needle. Let the site air-dry. 4. Remove the plastic cover from the needle on the syringe. Do not let the needle touch anything. 5. Hold the syringe with one hand and use your other hand to twist the rigid needle guard (covering the needle) counterclockwise. Pull the needle guard straight off the needle. Discard the needle guard. 6. When using a prefilled syringe, do not push the air bubble out of the syringe before the injection. The air bubble will help you get all of the medicine out of the syringe and into your subcutaneous tissue. 7. Hold the syringe in your writing hand like a pencil. 8. Use your other hand to pinch and hold about an inch (2.5 cm) of skin. Do not directly touch the cleaned part of the skin. 9. Insert the entire needle straight into the fold of skin. The needle should be at a 90-degree angle (perpendicular) to the skin. Push the needle all the way against the skin. 10. After the needle is completely inserted into the skin, release the skin that you are pinching. Continue to hold the syringe with your writing hand. 11. Use the thumb or index finger of your writing hand to push the plunger all the way into the syringe to inject the medicine. 12. Pull the needle straight out of the skin. 13. Press and hold an alcohol wipe over the injection site until the  bleeding stops. Do not rub the area. 14. Cover the injection site with a bandage, if needed. 15. Do not recap the needle. 16. If your syringe has a safety system for shielding the needle after injection: ? Firmly push down on the plunger after you complete the injection. The protective sleeve will automatically cover the needle, and you will hear a click. The click means that the needle is safely covered. 17. Place the syringe and needle in the disposal container.  What else do I need to know? 1. Do not use the syringe or needle more than one time. 2. Change the injection site each time you give yourself a shot. 3. Before an injection, make sure the medicine is a clear and colorless or pale yellow.  Do not use your medicine if it is discolored or has particles in it. Let your health care provider know right away. 4. Tell your health care providers, including dentists: ? That you are taking an anticoagulant, especially if you are injured or plan to have a procedure. ? If there is a change in your illness, and any other changes in medicines, supplements, or diet. 5. Take over-the-counter and prescription medicines only as told by your health care provider. 6. Keep your medicine safely stored at room temperature. 7. Keep all follow-up visits as told by your health care provider.  Contact a health care provider if:  You develop any rashes on your skin.  You have large areas of bruising on your skin.  Your condition worsens.  You develop a fever.  There is blood in your urine.  You are bleeding from your gums.  Get help right away if: 1. You develop bleeding problems such as: ? Vomiting blood or coughing up blood. ? Dark red blood in your urine. ? Blood in your stool or your stool has a dark, tarry, or coffee-ground appearance. 2. You have bleeding that does not stop. 3. You develop chest pain or shortness of breath. 4. You develop a severe headache or confusion.  These symptoms  may represent a serious problem that is an emergency. Do not wait to see if the symptoms will go away. Get medical help right away. Call your local emergency services (911 in the [place] Do not drive yourself to the hospital. Summary  Injectable blood thinners (anticoagulants) are medicines that help prevent blood clots from developing in the veins.  You inject the medicine through a needle into the layer of fat and tissue between skin and muscle (subcutaneous) in your belly.  Keep all follow-up visits as told by your health care provider. Follow-up visits include visits for lab tests. This information is not intended to replace advice given to you by your health care provider. Make sure you discuss any questions you have with your health care provider. Document Revised: 07/15/2017 Document Reviewed: 07/15/2017 Elsevier Patient Education  2020 [employer].   ==========================================

## 2019-12-17 NOTE — Discharge Summary (Addendum)
Discharge Summary    Patient ID: Clifford Jones MRN: [id number]; DOB: March 22, 1951  Admit date: 12/14/2019 Discharge date: 12/17/2019  Primary Care Provider: Lavella Lemons, PA  Primary Cardiologist: Kate Sable, MD  Primary Electrophysiologist:  None   Discharge Diagnoses    Principal Problem:   NSTEMI (non-ST elevated myocardial infarction) [facility]) Active Problems:   Hyperlipidemia   CAD, bypass graft   Hypertension   S/P AAA repair   Peripheral vascular disease ([place])   Tobacco abuse   LV (left ventricular) mural thrombus    Diagnostic Studies/Procedures    Echo 12/15/2019 Clifford Jones presented to the [facility] ER after 2 hours of dull, substernal chest pressure that began while eating pizza and drinking beer. He took 1 SLN with improvement but not eradication of the pain, which he felt was similar to his typical angina. When this did not complete relieve his pain he presented for evaluation.   Upon arrival to [facility], was normotensive with sinus tachycardia in low 100s. CP was 3/10 on arrival. Additional SLN was administered with cessation of pain. Initial EKG without ischemic changes and unchanged from prior tracing in our system in 05/2019. Other labs only notable for slight AKI to 1.02 from 0.8. Initial trop 0.51 -> 0.52 -> 3.1. Heparin gtt was initiated and he was accepted to [facility] for transfer.   Upon arrival to [facility], he is pain free and has not had recurrent CP since last night. He tells me that he does have occasional episode of angina, but that they usually respond rapidly to 1 SLN and this particular episode "felt different" and did not resolve completely after 1 SLN. He is usually ambulatory without typical exertional CP. He has been taking his lasix almost daily, and doesn't feel as though he has a significant amount of fluid. Has largely remained free of tobacco.   Cath 12/16/2019 Conclusions: 1. Severe native coronary artery disease, including 80% distal LMCA  stenosis with heavy calcification involving the ostium of the LCx where there is up to 90% stenosis, chronic total occlusions of the mid and distal LAD, 80% D1 stenosis as well as chronic total occlusions of ramus intermedius, distal LCx, and proximal RCA. 2. Widely patent LIMA-LAD (distal LAD is occluded beyond LIMA anastomosis) and RIMA-distal RCA. 3. Chronically occluded sequential SVG-D1-D2, and SVG-OM. SVG to ramus could not be engaged but is likely occluded given non-visualization with ascending aortogram and collateral filling of the ramus. 4. Mildly elevated left heart filling pressure (PCWP 20-25 mmHg). 5. Moderately to severely elevated right heart filling pressure (RA 15 mmHg). 6. Moderate pulmonary hypertension (mean PAP 36 mmHg). 7. Normal Fick cardiac output/index.  Recommendations: 1. Gentle diuresis. 2. Optimize antianginal therapy; I will add isosorbide mononitrate and continue current doses of amlodipine and ranolazine. 3. If the patient has refractory angina, only interventional target would be distal LMCA, though this would be very challenging given heavy calcification and severe stenosis of ostial LCx. PCI of D1 would also need to be considered at that time. However, I am not sure that benefits of the procedure would outweigh potential risks at this time. 4. Restart heparin infusion 2 hours after TR band removal. Consider transition to warfarin if concern for apical thrombus persists (in addition to aspirin).   _____________   History of Present Illness     WISE FEES is a 69 y.o. male with CAD (s/p CABG in 2003 with NSTEMI in 2008 and DES to [place], NSTEMI in 2013 with DES to  OM, and NSTEMI in 2015 with DES to RPAL), HTN, HLD,anda prior history oftobacco use who presents after an episode of CP found to have elevated cardiac enzymes concerning for NSTEMI. Clifford Jones presented to the [facility] ER after 2 hours of dull, substernal chest pressure that began while  eating pizza and drinking beer. He took 1 SLN with improvement but not eradication of the pain, which he felt was similar to his typical angina. When this did not complete relieve his pain he presented for evaluation.   Upon arrival to [facility], was normotensive with sinus tachycardia in low 100s. CP was 3/10 on arrival. Additional SLN was administered with cessation of pain. Initial EKG without ischemic changes and unchanged from prior tracing in our system in 05/2019. Other labs only notable for slight AKI to 1.02 from 0.8. Initial trop 0.51 -> 0.52 -> 3.1. Heparin gtt was initiated and he was accepted to [facility] for transfer.   Upon arrival to [facility], he is pain free and has not had recurrent CP since last night. He tells me that he does have occasional episode of angina, but that they usually respond rapidly to 1 SLN and this particular episode "felt different" and did not resolve completely after 1 SLN. He is usually ambulatory without typical exertional CP. He has been taking his lasix almost daily, and doesn't feel as though he has a significant amount of fluid. Has largely remained free of tobacco.    Hospital Course     Consultants: N/A  Patient was admitted to cardiology service.  He was placed on IV heparin given NSTEMI.  High-sensitivity troponin on arrival was 2228, this gradually trended down after that.  Echocardiogram performed on 12/15/2019 showed a small apical LV thrombus measuring 6 x 6 mm, EF 50 to 55%, grade 2 DD, RVSP 28.6 mmHg.  Cardiac catheterization performed on 12/16/2019 showed 80% distal left main with heavy calcification involving the ostium of the left circumflex up to 90%, chronic total occlusion of mid and distal LAD, 80% D1 stenosis, chronic total occlusion of ramus intermedius, distal left circumflex and proximal RCA, widely patent LIMA to LAD and patent RIMA to distal RCA, chronically occluded sequential SVG to D1 and D2, chronically occluded SVG to OM.  SVG to ramus was unable to  be engaged however likely occluded RCA was not seen on the ascending aortogram.  Postprocedure, medical therapy was recommended.  If patient has any refractory angiogram, only interventional target would be distal left main although this will be very challenging given the heavy calcification and severe stenosis in the ostial left circumflex artery.  PCI of D1 could also be considered at the time.  Postprocedure, Coumadin was initiated given apical thrombus.  He will be discharged on Lovenox therapy today and has follow-up in our Coumadin clinic in the [place] office this coming Thursday 12/19/2019.  Since patient has been bradycardic, carvedilol was reduced to 3.125 mg BID and lisinopril reduced to 2.5 mg daily. Imdur was started for antianginal purpose.  Lasix changed to 20 mg daily.   Did the patient have an acute coronary syndrome (MI, NSTEMI, STEMI, etc) this admission?:  Yes                               AHA/ACC Clinical Performance & Quality Measures: 1. Aspirin prescribed? - Yes 2. ADP Receptor Inhibitor (Plavix/Clopidogrel, Brilinta/Ticagrelor or Effient/Prasugrel) prescribed (includes medically managed patients)? - No - medical therapy 3.  Beta Blocker prescribed? - Yes 4. High Intensity Statin (Lipitor 40-80mg  or Crestor 20-40mg ) prescribed? - Yes 5. EF assessed during THIS hospitalization? - Yes 6. For EF <40%, was ACEI/ARB prescribed? - Not Applicable (EF >/= 67%) 7. For EF <40%, Aldosterone Antagonist (Spironolactone or Eplerenone) prescribed? - Not Applicable (EF >/= 59%) 8. Cardiac Rehab Phase II ordered (including medically managed patients)? - Yes   _____________  Discharge Vitals Blood pressure 121/76, pulse 63, temperature 97.9 F (36.6 C), temperature source Oral, resp. rate 16, height 6' (1.829 m), weight 105.1 kg, SpO2 91 %.  Filed Weights   12/15/19 0529 12/16/19 0552 12/17/19 0637  Weight: 106.6 kg 105.9 kg 105.1 kg    Labs & Radiologic Studies    CBC Recent Labs     12/16/19 0447 12/16/19 0759 12/16/19 0803 12/17/19 0451  WBC 9.7  --   --  10.2  HGB 14.1   < > 15.3 13.7  HCT 44.5   < > 45.0 43.0  MCV 99.3  --   --  98.6  PLT 181  --   --  148*   < > = values in this interval not displayed.   Basic Metabolic Panel Recent Labs    12/15/19 0236 12/16/19 0759 12/16/19 0803 12/17/19 0451  NA 139   < > 137 135  K 4.6   < > 4.2 4.0  CL 95*  --   --  91*  CO2 36*  --   --  35*  GLUCOSE 172*  --   --  138*  BUN 24*  --   --  25*  CREATININE 1.06  --   --  0.99  CALCIUM 9.0  --   --  9.1   < > = values in this interval not displayed.   Liver Function Tests Recent Labs    12/14/19 1901  AST 19  ALT 16  ALKPHOS 55  BILITOT 0.7  PROT 6.7  ALBUMIN 3.4*   No results for input(s): LIPASE, AMYLASE in the last 72 hours. High Sensitivity Troponin:   Recent Labs  Lab 12/14/19 1901 12/15/19 0617 12/15/19 0805  TROPONINIHS 2,228* 895* 768*    BNP Invalid input(s): POCBNP D-Dimer No results for input(s): DDIMER in the last 72 hours. Hemoglobin A1C Recent Labs    12/14/19 1901  HGBA1C 7.2*   Fasting Lipid Panel Recent Labs    12/15/19 0236  CHOL 143  HDL 57  LDLCALC 52  TRIG 168*  CHOLHDL 2.5   Thyroid Function Tests Recent Labs    12/14/19 1901  TSH 0.871   _____________  CARDIAC CATHETERIZATION  Result Date: 12/16/2019 Conclusions: 1. Severe native coronary artery disease, including 80% distal LMCA stenosis with heavy calcification involving the ostium of the LCx where there is up to 90% stenosis, chronic total occlusions of the mid and distal LAD, 80% D1 stenosis as well as chronic total occlusions of ramus intermedius, distal LCx, and proximal RCA. 2. Widely patent LIMA-LAD (distal LAD is occluded beyond LIMA anastomosis) and RIMA-distal RCA. 3. Chronically occluded sequential SVG-D1-D2, and SVG-OM.  SVG to ramus could not be engaged but is likely occluded given non-visualization with ascending aortogram and collateral  filling of the ramus. 4. Mildly elevated left heart filling pressure (PCWP 20-25 mmHg). 5. Moderately to severely elevated right heart filling pressure (RA 15 mmHg). 6. Moderate pulmonary hypertension (mean PAP 36 mmHg). 7. Normal Fick cardiac output/index.  Recommendations: 1. Gentle diuresis. 2. Optimize antianginal therapy; I will add isosorbide  mononitrate and continue current doses of amlodipine and ranolazine. 3. If the patient has refractory angina, only interventional target would be distal LMCA, though this would be very challenging given heavy calcification and severe stenosis of ostial LCx.  PCI of D1 would also need to be considered at that time.  However, I am not sure that benefits of the procedure would outweigh potential risks at this time. 4. Restart heparin infusion 2 hours after TR band removal.  Consider transition to warfarin if concern for apical thrombus persists (in addition to aspirin). Nelva Bush, MD [facility]   ECHOCARDIOGRAM COMPLETE  Result Date: 12/15/2019    ECHOCARDIOGRAM REPORT   Patient Name:   Clifford Jones Date of Exam: 12/15/2019 Medical Rec #:  [id number]      Height:       72.0 in Accession #:    [id number]     Weight:       235.0 lb Date of Birth:  Apr 07, 1951      BSA:          2.282 m Patient Age:    69 years       BP:           131/74 mmHg Patient Gender: M              HR:           77 bpm. Exam Location:  Inpatient Procedure: 2D Echo, Cardiac Doppler, Color Doppler and Intracardiac            Opacification Agent Indications:    R07.9 Chest pain  History:        Patient has prior history of Echocardiogram examinations, most                 recent 01/30/2019. Previous Myocardial Infarction and CAD, Prior                 CABG; Risk Factors:Hypertension, Dyslipidemia and Former Smoker.                 S/p AAA repair.  Sonographer:    Clayton Lefort RDCS (AE) Referring Phys: [id number] [place]  1. Possible small apical left ventricular thrombus (6 x 6  mm). Left ventricular ejection fraction, by estimation, is 50 to 55%. The left ventricle has low normal function. The left ventricle demonstrates regional wall motion abnormalities (see scoring diagram/findings for description). There is mild concentric left ventricular hypertrophy. Left ventricular diastolic parameters are consistent with Grade II diastolic dysfunction (pseudonormalization). Elevated left atrial pressure. There is mild dyskinesis of the left ventricular, entire apical segment.  2. Right ventricular systolic function is normal. The right ventricular size is normal. There is normal pulmonary artery systolic pressure. The estimated right ventricular systolic pressure is 61.6 mmHg.  3. The mitral valve is normal in structure. No evidence of mitral valve regurgitation.  4. The aortic valve is tricuspid. Aortic valve regurgitation is not visualized. Mild to moderate aortic valve sclerosis/calcification is present, without any evidence of aortic stenosis.  5. The inferior vena cava is normal in size with greater than 50% respiratory variability, suggesting right atrial pressure of 3 mmHg. Comparison(s): A prior study was performed on 01/30/2019. Prior images reviewed side by side. Although not reported on the previous study (performed without Definity), there is evidence of an apical wall motion abnormality on that study as well (cannot evaluate for LV apical thrombus on prior study). Conclusion(s)/Recommendation(s): The patient is currently receiving IV heparin. FINDINGS  Left Ventricle: Possible small apical left ventricular thrombus (6 x 6 mm). Left ventricular ejection fraction, by estimation, is 50 to 55%. The left ventricle has low normal function. The left ventricle demonstrates regional wall motion abnormalities. Mild dyskinesis of the left ventricular, entire apical segment. The left ventricular internal cavity size was normal in size. There is mild concentric left ventricular hypertrophy. Left  ventricular diastolic parameters are consistent with Grade II diastolic dysfunction (pseudonormalization). Elevated left atrial pressure. Right Ventricle: The right ventricular size is normal. No increase in right ventricular wall thickness. Right ventricular systolic function is normal. There is normal pulmonary artery systolic pressure. The tricuspid regurgitant velocity is 2.53 m/s, and  with an assumed right atrial pressure of 3 mmHg, the estimated right ventricular systolic pressure is 81.4 mmHg. Left Atrium: Left atrial size was normal in size. Right Atrium: Right atrial size was normal in size. Pericardium: There is no evidence of pericardial effusion. Mitral Valve: The mitral valve is normal in structure. Mild mitral annular calcification. No evidence of mitral valve regurgitation. MV peak gradient, 4.8 mmHg. The mean mitral valve gradient is 2.0 mmHg. Tricuspid Valve: The tricuspid valve is normal in structure. Tricuspid valve regurgitation is mild. Aortic Valve: The aortic valve is tricuspid. . There is mild thickening and mild calcification of the aortic valve. Aortic valve regurgitation is not visualized. Mild to moderate aortic valve sclerosis/calcification is present, without any evidence of aortic stenosis. There is mild thickening of the aortic valve. There is mild calcification of the aortic valve. Aortic valve mean gradient measures 5.0 mmHg. Aortic valve peak gradient measures 10.4 mmHg. Aortic valve area, by VTI measures 2.58 cm. Pulmonic Valve: The pulmonic valve was not well visualized. Pulmonic valve regurgitation is not visualized. Aorta: The aortic root is normal in size and structure. Venous: The inferior vena cava is normal in size with greater than 50% respiratory variability, suggesting right atrial pressure of 3 mmHg. IAS/Shunts: No atrial level shunt detected by color flow Doppler.  LEFT VENTRICLE PLAX 2D LVIDd:         5.30 cm  Diastology LVIDs:         3.80 cm  LV e' lateral:   8.43  cm/s LV PW:         1.60 cm  LV E/e' lateral: 12.3 LV IVS:        1.30 cm  LV e' medial:    5.43 cm/s LVOT diam:     2.30 cm  LV E/e' medial:  19.1 LV SV:         94 LV SV Index:   41 LVOT Area:     4.15 cm  RIGHT VENTRICLE            IVC RV Basal diam:  2.60 cm    IVC diam: 2.50 cm RV S prime:     6.68 cm/s TAPSE (M-mode): 1.2 cm LEFT ATRIUM             Index       RIGHT ATRIUM           Index LA diam:        3.80 cm 1.67 cm/m  RA Area:     18.60 cm LA Vol (A2C):   59.9 ml 26.25 ml/m RA Volume:   49.00 ml  21.48 ml/m LA Vol (A4C):   57.6 ml 25.24 ml/m LA Biplane Vol: 60.5 ml 26.52 ml/m  AORTIC VALVE AV Area (Vmax):    3.02 cm AV Area (Vmean):  3.04 cm AV Area (VTI):     2.58 cm AV Vmax:           161.00 cm/s AV Vmean:          108.000 cm/s AV VTI:            0.364 m AV Peak Grad:      10.4 mmHg AV Mean Grad:      5.0 mmHg LVOT Vmax:         117.00 cm/s LVOT Vmean:        78.900 cm/s LVOT VTI:          0.226 m LVOT/AV VTI ratio: 0.62  AORTA Ao Root diam: 3.60 cm MITRAL VALVE                TRICUSPID VALVE MV Area (PHT): 3.66 cm     TR Peak grad:   25.6 mmHg MV Peak grad:  4.8 mmHg     TR Vmax:        253.00 cm/s MV Mean grad:  2.0 mmHg MV Vmax:       1.09 m/s     SHUNTS MV Vmean:      60.6 cm/s    Systemic VTI:  0.23 m MV Decel Time: 208 msec     Systemic Diam: 2.30 cm MV E velocity: 103.50 cm/s MV A velocity: 96.30 cm/s MV E/A ratio:  1.07 Mihai Croitoru MD Electronically signed by Sanda Klein MD Signature Date/Time: 12/15/2019/4:47:58 PM    Final    Disposition   Pt is being discharged home today in good condition.  Follow-up Plans & Appointments     Follow-up Information    Verta Ellen., NP Follow up on 12/25/2019.   Specialty: Cardiology Why: 10:00AM. Cardiology followup with Dr. Court Joy NP. Obtain BMET Contact information: [place] [id number] [place] Follow up on 12/19/2019.   Specialty:  Cardiology Why: 9:15AM. Coumadin clinic followup Contact information: [place] [phone]             Discharge Instructions    Diet - low sodium heart healthy   Complete by: As directed    Discharge instructions   Complete by: As directed    No driving for 24 hours. No lifting over 5 lbs for 1 week. No sexual activity for 1 week. Keep procedure site clean & dry. If you notice increased pain, swelling, bleeding or pus, call/return!  You may shower, but no soaking baths/hot tubs/pools for 1 week.   Increase activity slowly   Complete by: As directed       Discharge Medications   Allergies as of 12/17/2019   No Known Allergies     Medication List    TAKE these medications   acetaminophen 500 MG tablet Commonly known as: TYLENOL Take 1,000 mg by mouth every 6 (six) hours as needed for mild pain, moderate pain or headache.   amLODipine 2.5 MG tablet Commonly known as: NORVASC Take 1 tablet (2.5 mg total) by mouth daily.   aspirin 81 MG EC tablet Take 1 tablet (81 mg total) by mouth daily.   budesonide 0.5 MG/2ML nebulizer solution Commonly known as: PULMICORT Take 2 mLs (0.5 mg total) by nebulization 2 (two) times daily.   carvedilol 3.125 MG tablet Commonly known as: COREG Take 1 tablet (3.125 mg total) by mouth 2 (  two) times daily. What changed:   medication strength  how much to take   enoxaparin 100 MG/ML injection Commonly known as: LOVENOX Inject 1.05 mLs (105 mg total) into the skin 2 (two) times daily for 5 days.   famotidine 20 MG tablet Commonly known as: PEPCID Take 20 mg by mouth 2 (two) times daily as needed for heartburn or indigestion (or bloating).   furosemide 20 MG tablet Commonly known as: LASIX Take 1 tablet (20 mg total) by mouth daily. Start taking on: December 18, 2019 What changed:   medication strength  how much to take   ipratropium-albuterol 0.5-2.5 (3) MG/3ML Soln Commonly  known as: DUONEB Inhale 3 mLs into the lungs every 6 (six) hours. What changed:   when to take this  reasons to take this   isosorbide mononitrate 30 MG 24 hr tablet Commonly known as: IMDUR Take 1 tablet (30 mg total) by mouth daily. Start taking on: December 18, 2019   lisinopril 2.5 MG tablet Commonly known as: ZESTRIL Take 1 tablet (2.5 mg total) by mouth daily. Start taking on: December 18, 2019 What changed:   medication strength  how much to take  Another medication with the same name was removed. Continue taking this medication, and follow the directions you see here.   nitroGLYCERIN 0.4 MG SL tablet Commonly known as: NITROSTAT Place 1 tablet (0.4 mg total) under the tongue every 5 (five) minutes x 3 doses as needed for chest pain.   pantoprazole 40 MG tablet Commonly known as: PROTONIX TAKE 1 TABLET BY MOUTH TWICE DAILY   Ranexa 1000 MG SR tablet Generic drug: ranolazine TAKE ONE TABLET BY MOUTH TWICE DAILY What changed:   how much to take  when to take this   rosuvastatin 40 MG tablet Commonly known as: CRESTOR Take 1 tablet (40 mg total) by mouth at bedtime.   warfarin 7.5 MG tablet Commonly known as: COUMADIN Take 1 tablet (7.5 mg total) by mouth one time only at 4 PM.          Outstanding Labs/Studies   BMET on followup Coumadin clinic visit this Thursday  Duration of Discharge Encounter   Greater than 30 minutes including physician time.  Hilbert Corrigan, [place] 12/17/2019, 12:07 PM

## 2019-12-17 NOTE — Progress Notes (Signed)
New Rx sent in for Lovenox 100mg , given 20 doses as the copay is the same as 10 doses. This way, he is guaranteed to have enough.

## 2019-12-17 NOTE — Plan of Care (Signed)
  Problem: Clinical Measurements: Goal: Ability to maintain clinical measurements within normal limits will improve Outcome: Progressing   Problem: Clinical Measurements: Goal: Cardiovascular complication will be avoided Outcome: Progressing   Problem: Pain Managment: Goal: General experience of comfort will improve Outcome: Progressing   

## 2019-12-17 NOTE — Progress Notes (Signed)
ANTICOAGULATION CONSULT NOTE - Follow-Up Consult  Pharmacy Consult for Heparin>warfarin Indication: LV thrombus  No Known Allergies  Patient Measurements: Height: 6' (182.9 cm) Weight: 105.1 kg (231 lb 9.6 oz) IBW/kg (Calculated) : 77.6 Heparin Dosing Weight: 100kg  Vital Signs: Temp: 97.9 F (36.6 C) (06/29 0751) Temp Source: Oral (06/29 0751) BP: 122/57 (06/29 0751) Pulse Rate: 72 (06/29 0751)  Labs: Recent Labs    12/14/19 1901 12/14/19 2005 12/15/19 0236 12/15/19 0617 12/15/19 0805 12/15/19 0805 12/16/19 3614 12/16/19 4315 12/16/19 0759 12/16/19 0759 12/16/19 0803 12/17/19 0451  HGB  --   --   --   --  14.4   < > 14.1   < > 15.3   < > 15.3 13.7  HCT  --   --   --   --  46.3   < > 44.5   < > 45.0  --  45.0 43.0  PLT  --   --   --   --  178  --  181  --   --   --   --  148*  LABPROT  --   --   --   --   --   --   --   --   --   --   --  15.0  INR  --   --   --   --   --   --   --   --   --   --   --  1.2  HEPARINUNFRC  --    < >  --   --  0.42  --  0.37  --   --   --   --  0.40  CREATININE 1.10  --  1.06  --   --   --   --   --   --   --   --  0.99  TROPONINIHS 2,228*  --   --  895* 768*  --   --   --   --   --   --   --    < > = values in this interval not displayed.    Estimated Creatinine Clearance: 90.7 mL/min (by C-G formula based on SCr of 0.99 mg/dL).   Medical History: Past Medical History:  Diagnosis Date   Ampullary adenoma    Arthritis    Reported by patient   Cancer [facility])    Reported by patient   Claustrophobia    Closed fracture of right distal tibia    COPD (chronic obstructive pulmonary disease) ([place])    Reported by patient   Coronary artery disease    a. 1999 Inf MI;  b. 2003 CABGx6;  c. 2008 NSTEMI, occluded SVG to marginal and diagonal system. 2 DES placed to marginal system;  c. NSTEMI - 02/2012 Xience Xpedition DES to oS-OM and POBA to mid lesion;  c. 10/2013 NSTEMI/PCI: VG->OM 80 ISR, native OM 80/90 small, LIMA->LAD ok w/ 90  dLAD, D1 80, RIMA->RPL ok w/ 95 in RPL (2.5x14 Resolute DES), nl EF.   Dyspnea    Reported by patient   GERD (gastroesophageal reflux disease)    Hyperlipidemia    Hypertension    Myocardial infarction (HCC)    X 6   Pneumonia    Presence of permanent cardiac pacemaker    S/P AAA repair    Tobacco abuse    Wears dentures    Wears glasses      Assessment: 69 yo M with  Hx CAD, CABG 2003, DES 2008, 2013, 2015, admitted to OSH with CP and transferred to [facility] for cardiac evaluation. He is s/p cath with possible LV thrombus. Noted plans for possible lovenox as outpatient -Heparin level remains therapeutic on 1200 units/hr -INR= 1.2 -CBC stable   Goal of Therapy:  Heparin level 0.3-0.7 units/ml Monitor platelets by anticoagulation protocol: Yes   Plan:  -No heparin changes needed -Warfarin 7.5mg  po x1 -If using lovenox will need 100mg  [place] q12h -CBC and heparin level daily  Hildred Laser, PharmD Clinical Pharmacist **Pharmacist phone directory can now be found on [email or web address] ([email or web address]).  Listed under [place].

## 2019-12-17 NOTE — Care Management (Addendum)
0956 12-17-19 Case Manager received consult for Lovenox-benefits check in process and Case Manager will make patient aware of cost. Patient to be educated regarding Lovenox injections prior to transition home. Per MD notes patient will need to follow up in [facility] for INR checks. Case Manager called PA to see if he will set up INR check or if needs Case Manager to schedule. No further needs from Case Manager at this time. Bethena Roys, RN,BSN Case Manager     1036 12-17-19 Received call back from PA and he will schedule INR check for this Thursday in [place]. Bethena Roys, RN,BSN Case Manager

## 2019-12-17 NOTE — Progress Notes (Signed)
Progress Note  Patient Name: Clifford Jones Date of Encounter: 12/17/2019  [facility] Cardiologist: Kate Sable, MD   Subjective   Pt denies CP or dyspnea  Inpatient Medications    Scheduled Meds:  amLODipine  2.5 mg Oral Daily   aspirin EC  81 mg Oral Daily   carvedilol  6.25 mg Oral BID   famotidine  20 mg Oral QHS   furosemide  40 mg Intravenous Daily   isosorbide mononitrate  30 mg Oral Daily   lisinopril  10 mg Oral Daily   pantoprazole  40 mg Oral Daily   ranolazine  1,000 mg Oral BID   rosuvastatin  40 mg Oral QHS   sodium chloride flush  3 mL Intravenous Q12H   sodium chloride flush  3 mL Intravenous Q12H   Warfarin - Pharmacist Dosing Inpatient   Does not apply q1600   Continuous Infusions:  sodium chloride     heparin 1,200 Units/hr (12/16/19 1545)   PRN Meds: sodium chloride, acetaminophen, albuterol, nitroGLYCERIN, sodium chloride flush   Vital Signs    Vitals:   12/16/19 1940 12/16/19 2008 12/17/19 0637 12/17/19 0751  BP:  109/75 100/74 (!) 122/57  Pulse:  61 (!) 54 72  Resp:  17 18 16   Temp:  98 F (36.7 C) 98.2 F (36.8 C) 97.9 F (36.6 C)  TempSrc:  Oral Oral Oral  SpO2: 96% 93% 93% 91%  Weight:   105.1 kg   Height:        Intake/Output Summary (Last 24 hours) at 12/17/2019 0752 Last data filed at 12/17/2019 0600 Gross per 24 hour  Intake 578 ml  Output 1750 ml  Net -1172 ml   Last 3 Weights 12/17/2019 12/16/2019 12/15/2019  Weight (lbs) 231 lb 9.6 oz 233 lb 7.5 oz 235 lb  Weight (kg) 105.053 kg 105.9 kg 106.595 kg      Telemetry    Sinus- Personally Reviewed  Physical Exam   GEN: NAD Neck: No JVD, supple Cardiac: RRR Respiratory: CTA GI: Soft, NT/ND MS: No edema; radial cath site with no hematoma Neuro:  Grossly intact Psych: Normal affect   Labs    High Sensitivity Troponin:   Recent Labs  Lab 12/14/19 1901 12/15/19 0617 12/15/19 0805  TROPONINIHS 2,228* 895* 768*      Chemistry Recent  Labs  Lab 12/14/19 1901 12/14/19 1901 12/15/19 0236 12/15/19 0236 12/16/19 0759 12/16/19 0803 12/17/19 0451  NA 138   < > 139   < > 138 137 135  K 4.0   < > 4.6   < > 4.2 4.2 4.0  CL 93*  --  95*  --   --   --  91*  CO2 35*  --  36*  --   --   --  35*  GLUCOSE 218*  --  172*  --   --   --  138*  BUN 25*  --  24*  --   --   --  25*  CREATININE 1.10  --  1.06  --   --   --  0.99  CALCIUM 9.1  --  9.0  --   --   --  9.1  PROT 6.7  --   --   --   --   --   --   ALBUMIN 3.4*  --   --   --   --   --   --   AST 19  --   --   --   --   --   --  ALT 16  --   --   --   --   --   --   ALKPHOS 55  --   --   --   --   --   --   BILITOT 0.7  --   --   --   --   --   --   GFRNONAA >60  --  >60  --   --   --  >60  GFRAA >60  --  >60  --   --   --  >60  ANIONGAP 10  --  8  --   --   --  9   < > = values in this interval not displayed.     Hematology Recent Labs  Lab 12/15/19 0805 12/15/19 0805 12/16/19 0447 12/16/19 0447 12/16/19 0759 12/16/19 0803 12/17/19 0451  WBC 11.1*  --  9.7  --   --   --  10.2  RBC 4.71  --  4.48  --   --   --  4.36  HGB 14.4   < > 14.1   < > 15.3 15.3 13.7  HCT 46.3   < > 44.5   < > 45.0 45.0 43.0  MCV 98.3  --  99.3  --   --   --  98.6  MCH 30.6  --  31.5  --   --   --  31.4  MCHC 31.1  --  31.7  --   --   --  31.9  RDW 12.5  --  12.3  --   --   --  12.1  PLT 178  --  181  --   --   --  148*   < > = values in this interval not displayed.    BNP Recent Labs  Lab 12/14/19 1901  BNP 181.5*    Radiology    CARDIAC CATHETERIZATION  Result Date: 12/16/2019 Conclusions: 1. Severe native coronary artery disease, including 80% distal LMCA stenosis with heavy calcification involving the ostium of the LCx where there is up to 90% stenosis, chronic total occlusions of the mid and distal LAD, 80% D1 stenosis as well as chronic total occlusions of ramus intermedius, distal LCx, and proximal RCA. 2. Widely patent LIMA-LAD (distal LAD is occluded beyond LIMA  anastomosis) and RIMA-distal RCA. 3. Chronically occluded sequential SVG-D1-D2, and SVG-OM.  SVG to ramus could not be engaged but is likely occluded given non-visualization with ascending aortogram and collateral filling of the ramus. 4. Mildly elevated left heart filling pressure (PCWP 20-25 mmHg). 5. Moderately to severely elevated right heart filling pressure (RA 15 mmHg). 6. Moderate pulmonary hypertension (mean PAP 36 mmHg). 7. Normal Fick cardiac output/index.  Recommendations: 1. Gentle diuresis. 2. Optimize antianginal therapy; I will add isosorbide mononitrate and continue current doses of amlodipine and ranolazine. 3. If the patient has refractory angina, only interventional target would be distal LMCA, though this would be very challenging given heavy calcification and severe stenosis of ostial LCx.  PCI of D1 would also need to be considered at that time.  However, I am not sure that benefits of the procedure would outweigh potential risks at this time. 4. Restart heparin infusion 2 hours after TR band removal.  Consider transition to warfarin if concern for apical thrombus persists (in addition to aspirin). Nelva Bush, MD [facility]   ECHOCARDIOGRAM COMPLETE  Result Date: 12/15/2019    ECHOCARDIOGRAM REPORT   Patient Name:   Clifford Jones  Alarie Date of Exam: 12/15/2019 Medical Rec #:  [id number]      Height:       72.0 in Accession #:    [id number]     Weight:       235.0 lb Date of Birth:  1951/03/17      BSA:          2.282 m Patient Age:    80 years       BP:           131/74 mmHg Patient Gender: M              HR:           77 bpm. Exam Location:  Inpatient Procedure: 2D Echo, Cardiac Doppler, Color Doppler and Intracardiac            Opacification Agent Indications:    R07.9 Chest pain  History:        Patient has prior history of Echocardiogram examinations, most                 recent 01/30/2019. Previous Myocardial Infarction and CAD, Prior                 CABG; Risk Factors:Hypertension,  Dyslipidemia and Former Smoker.                 S/p AAA repair.  Sonographer:    Clayton Lefort RDCS (AE) Referring Phys: [id number] [place]  1. Possible small apical left ventricular thrombus (6 x 6 mm). Left ventricular ejection fraction, by estimation, is 50 to 55%. The left ventricle has low normal function. The left ventricle demonstrates regional wall motion abnormalities (see scoring diagram/findings for description). There is mild concentric left ventricular hypertrophy. Left ventricular diastolic parameters are consistent with Grade II diastolic dysfunction (pseudonormalization). Elevated left atrial pressure. There is mild dyskinesis of the left ventricular, entire apical segment.  2. Right ventricular systolic function is normal. The right ventricular size is normal. There is normal pulmonary artery systolic pressure. The estimated right ventricular systolic pressure is 16.0 mmHg.  3. The mitral valve is normal in structure. No evidence of mitral valve regurgitation.  4. The aortic valve is tricuspid. Aortic valve regurgitation is not visualized. Mild to moderate aortic valve sclerosis/calcification is present, without any evidence of aortic stenosis.  5. The inferior vena cava is normal in size with greater than 50% respiratory variability, suggesting right atrial pressure of 3 mmHg. Comparison(s): A prior study was performed on 01/30/2019. Prior images reviewed side by side. Although not reported on the previous study (performed without Definity), there is evidence of an apical wall motion abnormality on that study as well (cannot evaluate for LV apical thrombus on prior study). Conclusion(s)/Recommendation(s): The patient is currently receiving IV heparin. FINDINGS  Left Ventricle: Possible small apical left ventricular thrombus (6 x 6 mm). Left ventricular ejection fraction, by estimation, is 50 to 55%. The left ventricle has low normal function. The left ventricle demonstrates regional  wall motion abnormalities. Mild dyskinesis of the left ventricular, entire apical segment. The left ventricular internal cavity size was normal in size. There is mild concentric left ventricular hypertrophy. Left ventricular diastolic parameters are consistent with Grade II diastolic dysfunction (pseudonormalization). Elevated left atrial pressure. Right Ventricle: The right ventricular size is normal. No increase in right ventricular wall thickness. Right ventricular systolic function is normal. There is normal pulmonary artery systolic pressure. The tricuspid regurgitant velocity is 2.53 m/s, and  with an  assumed right atrial pressure of 3 mmHg, the estimated right ventricular systolic pressure is 30.8 mmHg. Left Atrium: Left atrial size was normal in size. Right Atrium: Right atrial size was normal in size. Pericardium: There is no evidence of pericardial effusion. Mitral Valve: The mitral valve is normal in structure. Mild mitral annular calcification. No evidence of mitral valve regurgitation. MV peak gradient, 4.8 mmHg. The mean mitral valve gradient is 2.0 mmHg. Tricuspid Valve: The tricuspid valve is normal in structure. Tricuspid valve regurgitation is mild. Aortic Valve: The aortic valve is tricuspid. . There is mild thickening and mild calcification of the aortic valve. Aortic valve regurgitation is not visualized. Mild to moderate aortic valve sclerosis/calcification is present, without any evidence of aortic stenosis. There is mild thickening of the aortic valve. There is mild calcification of the aortic valve. Aortic valve mean gradient measures 5.0 mmHg. Aortic valve peak gradient measures 10.4 mmHg. Aortic valve area, by VTI measures 2.58 cm. Pulmonic Valve: The pulmonic valve was not well visualized. Pulmonic valve regurgitation is not visualized. Aorta: The aortic root is normal in size and structure. Venous: The inferior vena cava is normal in size with greater than 50% respiratory variability,  suggesting right atrial pressure of 3 mmHg. IAS/Shunts: No atrial level shunt detected by color flow Doppler.  LEFT VENTRICLE PLAX 2D LVIDd:         5.30 cm  Diastology LVIDs:         3.80 cm  LV e' lateral:   8.43 cm/s LV PW:         1.60 cm  LV E/e' lateral: 12.3 LV IVS:        1.30 cm  LV e' medial:    5.43 cm/s LVOT diam:     2.30 cm  LV E/e' medial:  19.1 LV SV:         94 LV SV Index:   41 LVOT Area:     4.15 cm  RIGHT VENTRICLE            IVC RV Basal diam:  2.60 cm    IVC diam: 2.50 cm RV S prime:     6.68 cm/s TAPSE (M-mode): 1.2 cm LEFT ATRIUM             Index       RIGHT ATRIUM           Index LA diam:        3.80 cm 1.67 cm/m  RA Area:     18.60 cm LA Vol (A2C):   59.9 ml 26.25 ml/m RA Volume:   49.00 ml  21.48 ml/m LA Vol (A4C):   57.6 ml 25.24 ml/m LA Biplane Vol: 60.5 ml 26.52 ml/m  AORTIC VALVE AV Area (Vmax):    3.02 cm AV Area (Vmean):   3.04 cm AV Area (VTI):     2.58 cm AV Vmax:           161.00 cm/s AV Vmean:          108.000 cm/s AV VTI:            0.364 m AV Peak Grad:      10.4 mmHg AV Mean Grad:      5.0 mmHg LVOT Vmax:         117.00 cm/s LVOT Vmean:        78.900 cm/s LVOT VTI:          0.226 m LVOT/AV VTI ratio: 0.62  AORTA Ao Root  diam: 3.60 cm MITRAL VALVE                TRICUSPID VALVE MV Area (PHT): 3.66 cm     TR Peak grad:   25.6 mmHg MV Peak grad:  4.8 mmHg     TR Vmax:        253.00 cm/s MV Mean grad:  2.0 mmHg MV Vmax:       1.09 m/s     SHUNTS MV Vmean:      60.6 cm/s    Systemic VTI:  0.23 m MV Decel Time: 208 msec     Systemic Diam: 2.30 cm MV E velocity: 103.50 cm/s MV A velocity: 96.30 cm/s MV E/A ratio:  1.07 Mihai Croitoru MD Electronically signed by Sanda Klein MD Signature Date/Time: 12/15/2019/4:47:58 PM    Final     Patient Profile     69 y.o. male with coronary artery disease admitted with non-ST elevation myocardial infarction.  Also with hypertension and hyperlipidemia.  Echocardiogram shows ejection fraction 50 to 55% with possible apical  thrombus, mild left ventricular hypertrophy, grade 2 diastolic dysfunction.  Cardiac catheterization revealed severe native coronary artery disease, patent LIMA to the LAD (distal LAD occluded beyond LIMA anastomosis) and patent RIMA to the distal right coronary artery.  Saphenous vein graft to D1/D2, saphenous vein graft to OM and saphenous vein graft to ramus occluded.  Pulmonary capillary wedge pressure 20-25 and moderate pulmonary hypertension.  Medical therapy recommended.  Assessment & Plan    1 non-ST elevation myocardial infarction-cath results noted.  Plan medical therapy.  Continue aspirin, beta-blocker, statin and isosorbide.  We will also continue with low-dose amlodipine for antianginal purposes.    2 apical thrombus-noted on echocardiogram with contrast.  Coumadin has been initiated.  We will try and arrange home Lovenox today.  If so patient can be discharged and continue Lovenox until INR greater than 2.  He will need close follow-up in the Coumadin clinic in [place].   3 coronary artery disease-continue aspirin and statin.  4 chronic diastolic congestive heart failure-pulmonary capillary wedge pressure elevated at time of catheterization.  Change Lasix to 20 mg daily.  5 hyperlipidemia-continue statin.  6 hypertension-blood pressure has been borderline and patient also somewhat bradycardic.  Decrease carvedilol to 3.125 mg daily and decrease lisinopril to 2.5 mg daily.  Follow blood pressure and adjust regimen as needed.  7 history of COPD  Plan to arrange home Lovenox until INR therapeutic.  Will treat with Coumadin 5 mg daily.  Will need INR checked on Thursday of this week in [place].  Discontinue Lovenox once INR therapeutic.  Check potassium and renal function in 1 week.  Transition of care appointment with APP 1 week.  Follow-up in [place] with physician in approximately 12 weeks.  Greater than 30 minutes p.m. physician time. D2  For questions or updates, please contact [place] Please consult [email or web address] for contact info under        Signed, Kirk Ruths, MD  12/17/2019, 7:52 AM

## 2019-12-17 NOTE — TOC Benefit Eligibility Note (Signed)
Transition of Care [facility]) Benefit Eligibility Note    Patient Details  Name: Clifford Jones MRN: [id number] Date of Birth: [birth year]   Medication/Dose: Enoxaparin 100 mg. sub-q bid  eight days. 16 syringes  Covered?: Yes  Tier: Other (Tier 4)  Prescription Coverage Preferred Pharmacy: [facility], and [employer] mail order.  Spoke with Person/Company/Phone Number:: [place] [phone]) [employer] Ph# [phone]  Co-Pay: $95.00  Prior Approval: No  Deductible: Met       Shelda Altes Phone Number: 12/17/2019, 12:33 PM

## 2019-12-18 DIAGNOSIS — I10 Essential (primary) hypertension: Secondary | ICD-10-CM | POA: Diagnosis not present

## 2019-12-18 DIAGNOSIS — Z720 Tobacco use: Secondary | ICD-10-CM | POA: Diagnosis not present

## 2019-12-18 DIAGNOSIS — J441 Chronic obstructive pulmonary disease with (acute) exacerbation: Secondary | ICD-10-CM | POA: Diagnosis not present

## 2019-12-19 ENCOUNTER — Other Ambulatory Visit: Payer: Self-pay

## 2019-12-19 ENCOUNTER — Ambulatory Visit (INDEPENDENT_AMBULATORY_CARE_PROVIDER_SITE_OTHER): Payer: Medicare HMO | Admitting: *Deleted

## 2019-12-19 DIAGNOSIS — I513 Intracardiac thrombosis, not elsewhere classified: Secondary | ICD-10-CM | POA: Diagnosis not present

## 2019-12-19 DIAGNOSIS — Z5181 Encounter for therapeutic drug level monitoring: Secondary | ICD-10-CM

## 2019-12-19 LAB — POCT INR: INR: 3.7 — AB (ref 2.0–3.0)

## 2019-12-19 NOTE — Patient Instructions (Signed)
Hold warfarin tonight then decrease dose to 1/2 tablet daily except 1 tablet on Sundays and Wednesdays.  (confirm dose with pt) Stop Lovenox Recheck on 12/24/19

## 2019-12-24 ENCOUNTER — Other Ambulatory Visit: Payer: Self-pay

## 2019-12-24 ENCOUNTER — Ambulatory Visit (INDEPENDENT_AMBULATORY_CARE_PROVIDER_SITE_OTHER): Payer: Medicare HMO | Admitting: *Deleted

## 2019-12-24 ENCOUNTER — Telehealth: Payer: Self-pay | Admitting: *Deleted

## 2019-12-24 DIAGNOSIS — Z5181 Encounter for therapeutic drug level monitoring: Secondary | ICD-10-CM | POA: Diagnosis not present

## 2019-12-24 DIAGNOSIS — I513 Intracardiac thrombosis, not elsewhere classified: Secondary | ICD-10-CM | POA: Diagnosis not present

## 2019-12-24 LAB — POCT INR
INR: 8 — AB (ref 2.0–3.0)
INR: 11.1 — AB (ref 2.0–3.0)

## 2019-12-24 NOTE — Progress Notes (Addendum)
Cardiology Office Note  Date: 12/26/2019   ID: Clifford Jones, Clifford Jones [birth year], MRN [id number]  PCP:  Lavella Lemons, PA  Cardiologist:  Kate Sable, MD Electrophysiologist:  None   Chief Complaint: Follow-up CAD  History of Present Illness: Clifford Jones is a 69 y.o. male with a history of CAD (status post CABG 2003 wtih NSTEMI in 2008 and DES to marginal, NSTEMI 2013 with DES to OM, and NSTEMI 2015 with DES to RPAL), HTN, HLD, Hx of tobacco use.  Last encounter with Dr Bronson Ing 09/03/2019 via telemedicine. He had not seen the patient since 2017. Admit for PNA and COPD 05/2019. Was having CP every 2-3 days, mild 2/10. NTG x 1 relieved. Had been on Amlodipine and higher dose of Lisinopril prior to hospitalization for PNA. He was requesting Amlodipine during that visit. Taking Lasix  40 mg daily and prn  for LE edema.  Patient presented to emergency room at [facility] on 12/14/2019 with 2 hours of dull substernal chest pain began while eating pizza and drinking beer.  He took 1 sublingual nitroglycerin with improvement but did not totally alleviate pain.  He described the pain as similar to previous anginal symptoms.  When the nitroglycerin did not totally alleviate his pain he presented to the ER at [facility].  He was transferred to [facility] and was pain-free on arrival.  He had a cardiac catheterization on 12/16/2019.  Results are below.  Recommendations were gentle diuresis, optimizing anginal therapy and if patient had refractory angina only interventional target would be distal LMCA.  However this would be very challenging given heavy calcification and severe stenosis of ostial LCx.  PCI of D1 would also need to be considered at that time and provider was not sure that benefits of the procedure would outweigh the potential risks.  He presents today with no particular complaints.  He denies any current anginal symptoms but states he has chest pain all the time and takes  nitroglycerin as needed.  Denies any complications from cardiac catheterization.  Both left and right radial access site site are clean and dry without any hematoma, redness or swelling.  No current anginal or exertional symptoms, palpitations or arrhythmias, CVA or TIA-like symptoms, PND or orthopnea.  No bleeding issues, claudication-like issues, DVT or PE-like symptoms, or lower extremity edema.  Past Medical History:  Diagnosis Date  . Ampullary adenoma   . Arthritis    Reported by patient  . Cancer [facility])    Reported by patient  . Claustrophobia   . Closed fracture of right distal tibia   . COPD (chronic obstructive pulmonary disease) ([place])    Reported by patient  . Coronary artery disease    a. 1999 Inf MI;  b. 2003 CABGx6;  c. 2008 NSTEMI, occluded SVG to marginal and diagonal system. 2 DES placed to marginal system;  c. NSTEMI - 02/2012 Xience Xpedition DES to oS-OM and POBA to mid lesion;  c. 10/2013 NSTEMI/PCI: VG->OM 80 ISR, native OM 80/90 small, LIMA->LAD ok w/ 90 dLAD, D1 80, RIMA->RPL ok w/ 95 in RPL (2.5x14 Resolute DES), nl EF.  Marland Kitchen Dyspnea    Reported by patient  . GERD (gastroesophageal reflux disease)   . Hyperlipidemia   . Hypertension   . Myocardial infarction ([place])    X 6  . Pneumonia   . Presence of permanent cardiac pacemaker   . S/P AAA repair   . Tobacco abuse   . Wears dentures   .  Wears glasses     Past Surgical History:  Procedure Laterality Date  . ABDOMINAL AORTIC ANEURYSM REPAIR  2006  . APPENDECTOMY    . CORONARY ANGIOPLASTY WITH STENT PLACEMENT    . CORONARY ARTERY BYPASS GRAFT  2003  . ENDOSCOPIC RETROGRADE CHOLANGIOPANCREATOGRAPHY (ERCP) WITH PROPOFOL N/A 03/25/2019   Procedure: ENDOSCOPIC RETROGRADE CHOLANGIOPANCREATOGRAPHY (ERCP) WITH PROPOFOL;  Surgeon: Rush Landmark Telford Nab., MD;  Location: [place];  Service: Gastroenterology;  Laterality: N/A;  . ESOPHAGEAL DILATION  07/06/2018   Procedure: ESOPHAGEAL DILATION;  Surgeon: Rogene Houston, MD;  [facility];  Service: Endoscopy;;  . ESOPHAGOGASTRODUODENOSCOPY N/A 07/06/2018   Procedure: ESOPHAGOGASTRODUODENOSCOPY (EGD);  Surgeon: Rogene Houston, MD;  [facility];  Service: Endoscopy;  Laterality: N/A;  . ESOPHAGOGASTRODUODENOSCOPY N/A 08/16/2018   Procedure: ESOPHAGOGASTRODUODENOSCOPY (EGD);  Surgeon: Milus Banister, MD;  Location: [facility];  Service: Endoscopy;  Laterality: N/A;  . ESOPHAGOGASTRODUODENOSCOPY (EGD) WITH PROPOFOL N/A 03/25/2019   Procedure: ESOPHAGOGASTRODUODENOSCOPY (EGD) WITH PROPOFOL;  Surgeon: Rush Landmark Telford Nab., MD;  Location: [place];  Service: Gastroenterology;  Laterality: N/A;  . EUS N/A 08/16/2018   Procedure: UPPER ENDOSCOPIC ULTRASOUND (EUS) RADIAL;  Surgeon: Milus Banister, MD;  Location: [facility];  Service: Endoscopy;  Laterality: N/A;  . EUS N/A 03/25/2019   Procedure: UPPER ENDOSCOPIC ULTRASOUND (EUS) RADIAL;  Surgeon: Rush Landmark Telford Nab., MD;  Location: [place];  Service: Gastroenterology;  Laterality: N/A;  . FOREIGN BODY REMOVAL  07/06/2018   Procedure: FOREIGN BODY REMOVAL;  Surgeon: Rogene Houston, MD;  [facility];  Service: Endoscopy;;  . HERNIA REPAIR     umbicical  . LEFT HEART CATHETERIZATION WITH CORONARY ANGIOGRAM N/A 03/05/2012   Procedure: LEFT HEART CATHETERIZATION WITH CORONARY ANGIOGRAM;  Surgeon: Hillary Bow, MD;  Location: [facility] CATH LAB;  Service: Cardiovascular;  Laterality: N/A;  . LEFT HEART CATHETERIZATION WITH CORONARY ANGIOGRAM N/A 10/23/2013   Procedure: LEFT HEART CATHETERIZATION WITH CORONARY ANGIOGRAM;  Surgeon: Blane Ohara, MD;  Location: [facility] CATH LAB;  Service: Cardiovascular;  Laterality: N/A;  . LEFT HEART CATHETERIZATION WITH CORONARY/GRAFT ANGIOGRAM  10/22/2013   Procedure: LEFT HEART CATHETERIZATION WITH Beatrix Fetters;  Surgeon: Leonie Man, MD;  Location: [facility] CATH LAB;  Service: Cardiovascular;;  . PANCREATIC STENT PLACEMENT   03/25/2019   Procedure: PANCREATIC STENT PLACEMENT;  Surgeon: Rush Landmark Telford Nab., MD;  Location: [place];  Service: Gastroenterology;;  . PERCUTANEOUS CORONARY STENT INTERVENTION (PCI-S) N/A 03/07/2012   Procedure: PERCUTANEOUS CORONARY STENT INTERVENTION (PCI-S);  Surgeon: Sherren Mocha, MD;  Location: [facility] CATH LAB;  Service: Cardiovascular;  Laterality: N/A;  . PERCUTANEOUS CORONARY STENT INTERVENTION (PCI-S) Right 10/23/2013   Procedure: PERCUTANEOUS CORONARY STENT INTERVENTION (PCI-S);  Surgeon: Blane Ohara, MD;  Location: [facility] CATH LAB;  Service: Cardiovascular;  Laterality: Right;  . POLYPECTOMY  07/06/2018   Procedure: POLYPECTOMY;  Surgeon: Rogene Houston, MD;  [facility];  Service: Endoscopy;;  polyp at ampulla  . POLYPECTOMY  03/25/2019   Procedure: AMPULLECTOMY;  Surgeon: Mansouraty, Telford Nab., MD;  Location: [place];  Service: Gastroenterology;;  . Right hand surgery    . RIGHT HEART CATH AND CORONARY/GRAFT ANGIOGRAPHY N/A 12/16/2019   Procedure: RIGHT HEART CATH AND CORONARY/GRAFT ANGIOGRAPHY;  Surgeon: Nelva Bush, MD;  Location: [place] CV LAB;  Service: Cardiovascular;  Laterality: N/A;  . SPHINCTEROTOMY  03/25/2019   Procedure: SPHINCTEROTOMY;  Surgeon: Irving Copas., MD;  Location: [place];  Service: Gastroenterology;;  . TIBIA IM NAIL  INSERTION Right 04/07/2016   Procedure: FIXATION OF RIGHT TIBIA FRACTURE;  Surgeon: Garald Balding, MD;  Location: [place];  Service: Orthopedics;  Laterality: Right;    Current Outpatient Medications  Medication Sig Dispense Refill  . acetaminophen (TYLENOL) 500 MG tablet Take 1,000 mg by mouth every 6 (six) hours as needed for mild pain, moderate pain or headache.     Marland Kitchen amLODipine (NORVASC) 2.5 MG tablet Take 1 tablet (2.5 mg total) by mouth daily. 90 tablet 3  . aspirin 81 MG EC tablet Take 1 tablet (81 mg total) by mouth daily. 30 tablet 11  . budesonide (PULMICORT) 0.5 MG/2ML nebulizer  solution Take 2 mLs (0.5 mg total) by nebulization 2 (two) times daily. 120 mL 1  . carvedilol (COREG) 3.125 MG tablet Take 1 tablet (3.125 mg total) by mouth 2 (two) times daily. 60 tablet 3  . famotidine (PEPCID) 20 MG tablet Take 20 mg by mouth 2 (two) times daily as needed for heartburn or indigestion (or bloating).     . furosemide (LASIX) 20 MG tablet Take 1 tablet (20 mg total) by mouth daily. 30 tablet 2  . ipratropium-albuterol (DUONEB) 0.5-2.5 (3) MG/3ML SOLN Inhale 3 mLs into the lungs every 6 (six) hours. (Patient taking differently: Inhale 3 mLs into the lungs every 6 (six) hours as needed (for wheezing or shortness of breath). ) 360 mL 3  . isosorbide mononitrate (IMDUR) 30 MG 24 hr tablet Take 1 tablet (30 mg total) by mouth daily. 90 tablet 1  . lisinopril (ZESTRIL) 2.5 MG tablet Take 1 tablet (2.5 mg total) by mouth daily. 60 tablet 1  . nitroGLYCERIN (NITROSTAT) 0.4 MG SL tablet Place 1 tablet (0.4 mg total) under the tongue every 5 (five) minutes x 3 doses as needed for chest pain. 25 tablet 4  . pantoprazole (PROTONIX) 40 MG tablet TAKE 1 TABLET BY MOUTH TWICE DAILY (Patient taking differently: Take 40 mg by mouth 2 (two) times daily. ) 60 tablet 3  . RANEXA 1000 MG SR tablet TAKE ONE TABLET BY MOUTH TWICE DAILY (Patient taking differently: Take 1,000 mg by mouth 2 (two) times daily with a meal. ) 30 tablet 0  . rosuvastatin (CRESTOR) 40 MG tablet Take 1 tablet (40 mg total) by mouth at bedtime. 90 tablet 1  . warfarin (COUMADIN) 7.5 MG tablet Take 1 tablet (7.5 mg total) by mouth one time only at 4 PM. 30 tablet 0   No current facility-administered medications for this visit.   Allergies:  Patient has no known allergies.   Social History: The patient  reports that he has been smoking cigarettes. He started smoking about 55 years ago. He has a 54.00 pack-year smoking history. He has never used smokeless tobacco. He reports current alcohol use. He reports that he does not use  drugs.   Family History: The patient's family history includes Coronary artery disease in an other family member.   ROS:  Please see the history of present illness. Otherwise, complete review of systems is positive for none.  All other systems are reviewed and negative.   Physical Exam: VS:  BP 120/70   Pulse 60   Ht 6' (1.829 m)   Wt 232 lb 6.4 oz (105.4 kg)   SpO2 90% Comment: on room air  BMI 31.52 kg/m , BMI Body mass index is 31.52 kg/m.  Wt Readings from Last 3 Encounters:  12/25/19 232 lb 6.4 oz (105.4 kg)  12/17/19 231 lb 9.6 oz (105.1 kg)  09/03/19 235 lb (106.6 kg)    General: Patient appears comfortable at rest. Neck: Supple, no elevated JVP or carotid bruits, no thyromegaly. Lungs: Clear to auscultation, nonlabored breathing at rest. Cardiac: Regular rate and rhythm, no S3 or significant systolic murmur, no pericardial rub. Extremities: No pitting edema, distal pulses 2+. Skin: Warm and dry.  Right and left radial access sites for catheterization clean and dry with no redness swelling or ecchymosis Musculoskeletal: No kyphosis. Neuropsychiatric: Alert and oriented x3, affect grossly appropriate.  ECG:  EKG December 16, 2019 sinus bradycardia with sinus arrhythmia, nonspecific ST and T wave abnormality.  Heart rate of 55  Recent Labwork: 12/14/2019: ALT 16; AST 19; B Natriuretic Peptide 181.5; TSH 0.871 12/17/2019: BUN 25; Creatinine, Ser 0.99; Hemoglobin 13.7; Platelets 148; Potassium 4.0; Sodium 135     Component Value Date/Time   CHOL 143 12/15/2019 0236   TRIG 168 (H) 12/15/2019 0236   HDL 57 12/15/2019 0236   CHOLHDL 2.5 12/15/2019 0236   VLDL 34 12/15/2019 0236   LDLCALC 52 12/15/2019 0236    Other Studies Reviewed Today:   Cath 12/16/2019 Conclusions: 1. Severe native coronary artery disease, including 80% distal LMCA stenosis with heavy calcification involving the ostium of the LCx where there is up to 90% stenosis, chronic total occlusions of the mid  and distal LAD, 80% D1 stenosis as well as chronic total occlusions of ramus intermedius, distal LCx, and proximal RCA. 2. Widely patent LIMA-LAD (distal LAD is occluded beyond LIMA anastomosis) and RIMA-distal RCA. 3. Chronically occluded sequential SVG-D1-D2, and SVG-OM. SVG to ramus could not be engaged but is likely occluded given non-visualization with ascending aortogram and collateral filling of the ramus. 4. Mildly elevated left heart filling pressure (PCWP 20-25 mmHg). 5. Moderately to severely elevated right heart filling pressure (RA 15 mmHg). 6. Moderate pulmonary hypertension (mean PAP 36 mmHg). 7. Normal Fick cardiac output/index.  Recommendations: 1. Gentle diuresis. 2. Optimize antianginal therapy; I will add isosorbide mononitrate and continue current doses of amlodipine and ranolazine. 3. If the patient has refractory angina, only interventional target would be distal LMCA, though this would be very challenging given heavy calcification and severe stenosis of ostial LCx. PCI of D1 would also need to be considered at that time. However, I am not sure that benefits of the procedure would outweigh potential risks at this time. 4. Restart heparin infusion 2 hours after TR band removal. Consider transition to warfarin if concern for apical thrombus persists (in addition to aspirin). Diagnostic Dominance: Right     Echocardiogram: 01/30/2019 IMPRESSIONS  1. The left ventricle has normal systolic function, with an ejection fraction of 55-60%. The cavity size was normal. Mild basal septal hypertrophy. Diastolic dysfunction, grade indeterminate. Indeterminate filling pressures No evidence of left  ventricular regional wall motion abnormalities. 2. The mitral valve is grossly normal. 3. The tricuspid valve is grossly normal. 4. The aortic valve is tricuspid. Mild thickening of the aortic valve. Sclerosis without any evidence of stenosis of the aortic valve. 5. The aorta  is normal unless otherwise noted. 6. The inferior vena cava was dilated in size with >50% respiratory variability. 7. The interatrial septum appears to be lipomatous   NST 03/02/15:   There was no ST segment deviation noted during stress.  T wave inversion of 1 mm was noted during stress in the I, II, aVF, V5 and V6 leads. T wave inversion persisted.  Defect 1: There is a medium defect of severe severity present in the apical  anterior, apical inferior and apex location.  Findings consistent with prior myocardial infarction. There is no evidence of ischemia.  This is an intermediate risk study.  Nuclear stress EF: 39%.     Assessment and Plan:  1. CAD in native artery   2. Hypertension   3. Chronic diastolic heart failure ([place])   4. Mixed hyperlipidemia   5. LV (left ventricular) mural thrombus    1. CAD in native artery Denies any progressive anginal or exertional symptoms.  No intervention during recent cardiac catheterization.  Medical management recommended.  Continue aspirin 81 mg, carvedilol 3.125 mg p.o. twice daily, Imdur 30 mg daily, sublingual nitroglycerin as needed, Ranexa 1000 mg p.o. twice daily.  Warfarin is on hold due to supratherapeutic INR of 11.1.  He follows a Coumadin clinic  2. Hypertension He is normotensive today with a blood pressure of 120/70.  Continue amlodipine 2.5 mg daily  3. Chronic diastolic heart failure (HCC) No obvious weight gain, shortness of breath or lower extremity edema noted. Continue furosemide 20 mg daily.  Carvedilol 3.125 mg p.o. twice daily, Imdur 30 mg daily.  4. Mixed hyperlipidemia Continue Crestor 40 mg daily.  Recent lipid panel 10 days ago showed TC 143, TG 168, HDL 57, LDL 52  5. LV thrombus Continue warfarin 7.5 mg for LV mural thrombus   Medication Adjustments/Labs and Tests Ordered: Current medicines are reviewed at length with the patient today.  Concerns regarding medicines are outlined above.    Disposition: Follow-up with Dr. Bronson Ing or APP 6 months  Signed, Levell July, NP 12/26/2019 8:00 AM    [facility] [place], [place], [place] [address] Phone: [phone]; Fax: [phone]

## 2019-12-24 NOTE — Telephone Encounter (Signed)
Lab work drawn per Coumadin clinic visit today.  Forwarding results back to Ms. Clifford Jones, would follow anticoagulation clinic protocol.

## 2019-12-24 NOTE — Telephone Encounter (Signed)
Critical lab called from Mickel Baas in the lab of PT 111 sec and INR of 11.14.

## 2019-12-24 NOTE — Patient Instructions (Signed)
Hold warfarin x 3 days (Tues,Wed,Thurs) and come for INR check on Friday Pt denies any signs of bleeding Bleeding and fall precautions discussed with pt and wife.  They know to go to the ED if any bleeding develops or if pt should have a fall, especially if he hits his head.  They verbalize understanding. Recheck on 12/27/19

## 2019-12-25 ENCOUNTER — Ambulatory Visit (INDEPENDENT_AMBULATORY_CARE_PROVIDER_SITE_OTHER): Payer: Medicare HMO | Admitting: Family Medicine

## 2019-12-25 ENCOUNTER — Encounter: Payer: Self-pay | Admitting: Family Medicine

## 2019-12-25 VITALS — BP 120/70 | HR 60 | Ht 72.0 in | Wt 232.4 lb

## 2019-12-25 DIAGNOSIS — E782 Mixed hyperlipidemia: Secondary | ICD-10-CM

## 2019-12-25 DIAGNOSIS — I513 Intracardiac thrombosis, not elsewhere classified: Secondary | ICD-10-CM

## 2019-12-25 DIAGNOSIS — Z79899 Other long term (current) drug therapy: Secondary | ICD-10-CM | POA: Diagnosis not present

## 2019-12-25 DIAGNOSIS — I10 Essential (primary) hypertension: Secondary | ICD-10-CM | POA: Diagnosis not present

## 2019-12-25 DIAGNOSIS — I2510 Atherosclerotic heart disease of native coronary artery without angina pectoris: Secondary | ICD-10-CM

## 2019-12-25 DIAGNOSIS — I5032 Chronic diastolic (congestive) heart failure: Secondary | ICD-10-CM | POA: Diagnosis not present

## 2019-12-25 NOTE — Patient Instructions (Addendum)
Medication Instructions:   Your physician recommends that you continue on your current medications as directed. Please refer to the Current Medication list given to you today.  Labwork:  Your physician recommends that you return for lab work in: TODAY to check your BMET. This may be done at [facility] or [facility] or [facility] ([place]) Monday-Friday from 8:00 am - 4:00 pm. No appointment is needed.  Testing/Procedures:  NONE  Follow-Up:  Your physician recommends that you schedule a follow-up appointment in: 6 months (office). You will receive a reminder letter in the mail in about 4 months reminding you to call and schedule your appointment. If you don't receive this letter, please contact our office.  Any Other Special Instructions Will Be Listed Below (If Applicable).  If you need a refill on your cardiac medications before your next appointment, please call your pharmacy.

## 2019-12-27 ENCOUNTER — Ambulatory Visit (INDEPENDENT_AMBULATORY_CARE_PROVIDER_SITE_OTHER): Payer: Medicare HMO | Admitting: *Deleted

## 2019-12-27 ENCOUNTER — Other Ambulatory Visit: Payer: Self-pay

## 2019-12-27 DIAGNOSIS — I513 Intracardiac thrombosis, not elsewhere classified: Secondary | ICD-10-CM

## 2019-12-27 DIAGNOSIS — Z5181 Encounter for therapeutic drug level monitoring: Secondary | ICD-10-CM

## 2019-12-27 LAB — POCT INR: INR: 3.5 — AB (ref 2.0–3.0)

## 2019-12-27 MED ORDER — WARFARIN SODIUM 2 MG PO TABS: 2.0000 mg | ORAL_TABLET | Freq: Every day | ORAL | 3 refills | Status: DC

## 2019-12-27 NOTE — Patient Instructions (Signed)
Tablet decreased from 7.5,g to 2mg  Hold warfarin tonight then start warfarin 2mg  daily Recheck in 1 wk

## 2019-12-30 DIAGNOSIS — H353111 Nonexudative age-related macular degeneration, right eye, early dry stage: Secondary | ICD-10-CM | POA: Diagnosis not present

## 2019-12-30 DIAGNOSIS — Z0100 Encounter for examination of eyes and vision without abnormal findings: Secondary | ICD-10-CM | POA: Diagnosis not present

## 2019-12-30 DIAGNOSIS — H353123 Nonexudative age-related macular degeneration, left eye, advanced atrophic without subfoveal involvement: Secondary | ICD-10-CM | POA: Diagnosis not present

## 2019-12-30 DIAGNOSIS — H524 Presbyopia: Secondary | ICD-10-CM | POA: Diagnosis not present

## 2019-12-30 DIAGNOSIS — H52223 Regular astigmatism, bilateral: Secondary | ICD-10-CM | POA: Diagnosis not present

## 2019-12-30 DIAGNOSIS — H2513 Age-related nuclear cataract, bilateral: Secondary | ICD-10-CM | POA: Diagnosis not present

## 2020-01-02 ENCOUNTER — Ambulatory Visit (INDEPENDENT_AMBULATORY_CARE_PROVIDER_SITE_OTHER): Payer: Medicare HMO | Admitting: *Deleted

## 2020-01-02 DIAGNOSIS — Z5181 Encounter for therapeutic drug level monitoring: Secondary | ICD-10-CM

## 2020-01-02 DIAGNOSIS — I513 Intracardiac thrombosis, not elsewhere classified: Secondary | ICD-10-CM | POA: Diagnosis not present

## 2020-01-02 DIAGNOSIS — J449 Chronic obstructive pulmonary disease, unspecified: Secondary | ICD-10-CM | POA: Diagnosis not present

## 2020-01-02 DIAGNOSIS — I5040 Unspecified combined systolic (congestive) and diastolic (congestive) heart failure: Secondary | ICD-10-CM | POA: Diagnosis not present

## 2020-01-02 LAB — POCT INR: INR: 3.3 — AB (ref 2.0–3.0)

## 2020-01-02 NOTE — Patient Instructions (Signed)
Tablet decreased from 7.5,g to 2mg  Take warfarin 1/2 tablet tonight then decrease dose to 1 tablet daily except 1/2 tablet on Tuesdays and Fridays Recheck in 1 wk

## 2020-01-08 DIAGNOSIS — R739 Hyperglycemia, unspecified: Secondary | ICD-10-CM | POA: Diagnosis not present

## 2020-01-08 DIAGNOSIS — Z6831 Body mass index (BMI) 31.0-31.9, adult: Secondary | ICD-10-CM | POA: Diagnosis not present

## 2020-01-08 DIAGNOSIS — F419 Anxiety disorder, unspecified: Secondary | ICD-10-CM | POA: Diagnosis not present

## 2020-01-08 DIAGNOSIS — E785 Hyperlipidemia, unspecified: Secondary | ICD-10-CM | POA: Diagnosis not present

## 2020-01-08 DIAGNOSIS — J449 Chronic obstructive pulmonary disease, unspecified: Secondary | ICD-10-CM | POA: Diagnosis not present

## 2020-01-08 DIAGNOSIS — I10 Essential (primary) hypertension: Secondary | ICD-10-CM | POA: Diagnosis not present

## 2020-01-09 ENCOUNTER — Ambulatory Visit (INDEPENDENT_AMBULATORY_CARE_PROVIDER_SITE_OTHER): Payer: Medicare HMO | Admitting: *Deleted

## 2020-01-09 DIAGNOSIS — I513 Intracardiac thrombosis, not elsewhere classified: Secondary | ICD-10-CM | POA: Diagnosis not present

## 2020-01-09 DIAGNOSIS — Z5181 Encounter for therapeutic drug level monitoring: Secondary | ICD-10-CM | POA: Diagnosis not present

## 2020-01-09 LAB — POCT INR: INR: 3.1 — AB (ref 2.0–3.0)

## 2020-01-09 NOTE — Patient Instructions (Signed)
Tablet decreased from 7.5,g to 2mg  Decrease warfarin to 1 tablet daily except 1/2 tablet on Tuesdays, Thursdays and Saturdays Recheck in 2 wks

## 2020-01-23 ENCOUNTER — Ambulatory Visit (INDEPENDENT_AMBULATORY_CARE_PROVIDER_SITE_OTHER): Payer: Medicare HMO | Admitting: *Deleted

## 2020-01-23 DIAGNOSIS — I513 Intracardiac thrombosis, not elsewhere classified: Secondary | ICD-10-CM

## 2020-01-23 DIAGNOSIS — Z5181 Encounter for therapeutic drug level monitoring: Secondary | ICD-10-CM

## 2020-01-23 LAB — POCT INR: INR: 2.8 (ref 2.0–3.0)

## 2020-01-23 NOTE — Patient Instructions (Signed)
Tablet decreased from 7.5mg  to 2mg  Continue warfarin 1 tablet daily except 1/2 tablet on Tuesdays, Thursdays and Saturdays Recheck in 3 wks

## 2020-02-02 DIAGNOSIS — I5040 Unspecified combined systolic (congestive) and diastolic (congestive) heart failure: Secondary | ICD-10-CM | POA: Diagnosis not present

## 2020-02-02 DIAGNOSIS — J449 Chronic obstructive pulmonary disease, unspecified: Secondary | ICD-10-CM | POA: Diagnosis not present

## 2020-02-13 ENCOUNTER — Ambulatory Visit (INDEPENDENT_AMBULATORY_CARE_PROVIDER_SITE_OTHER): Payer: Medicare HMO | Admitting: *Deleted

## 2020-02-13 DIAGNOSIS — Z5181 Encounter for therapeutic drug level monitoring: Secondary | ICD-10-CM | POA: Diagnosis not present

## 2020-02-13 DIAGNOSIS — I513 Intracardiac thrombosis, not elsewhere classified: Secondary | ICD-10-CM

## 2020-02-13 LAB — POCT INR: INR: 3.2 — AB (ref 2.0–3.0)

## 2020-02-13 NOTE — Patient Instructions (Signed)
Tablet decreased from 7.5mg  to 2mg  Hold warfarin tonight then decrease dose to 1/2 tablet daily except 1 tablet on Mondays, Wednesdays and Fridays Recheck in 3 wks

## 2020-02-18 DIAGNOSIS — Z720 Tobacco use: Secondary | ICD-10-CM | POA: Diagnosis not present

## 2020-02-18 DIAGNOSIS — J441 Chronic obstructive pulmonary disease with (acute) exacerbation: Secondary | ICD-10-CM | POA: Diagnosis not present

## 2020-02-18 DIAGNOSIS — I10 Essential (primary) hypertension: Secondary | ICD-10-CM | POA: Diagnosis not present

## 2020-03-04 DIAGNOSIS — J449 Chronic obstructive pulmonary disease, unspecified: Secondary | ICD-10-CM | POA: Diagnosis not present

## 2020-03-04 DIAGNOSIS — I5040 Unspecified combined systolic (congestive) and diastolic (congestive) heart failure: Secondary | ICD-10-CM | POA: Diagnosis not present

## 2020-03-06 ENCOUNTER — Ambulatory Visit (INDEPENDENT_AMBULATORY_CARE_PROVIDER_SITE_OTHER): Payer: Medicare HMO | Admitting: *Deleted

## 2020-03-06 ENCOUNTER — Other Ambulatory Visit: Payer: Self-pay

## 2020-03-06 DIAGNOSIS — Z5181 Encounter for therapeutic drug level monitoring: Secondary | ICD-10-CM

## 2020-03-06 DIAGNOSIS — I513 Intracardiac thrombosis, not elsewhere classified: Secondary | ICD-10-CM

## 2020-03-06 LAB — POCT INR: INR: 2 (ref 2.0–3.0)

## 2020-03-06 NOTE — Patient Instructions (Signed)
Tablet decreased from 7.5mg  to 2mg  Continue warfarin 1/2 tablet daily except 1 tablet on Mondays, Wednesdays and Fridays Recheck in 4 wks

## 2020-03-09 ENCOUNTER — Ambulatory Visit: Payer: Medicare HMO | Admitting: Cardiovascular Disease

## 2020-03-19 DIAGNOSIS — J441 Chronic obstructive pulmonary disease with (acute) exacerbation: Secondary | ICD-10-CM | POA: Diagnosis not present

## 2020-03-19 DIAGNOSIS — Z720 Tobacco use: Secondary | ICD-10-CM | POA: Diagnosis not present

## 2020-03-19 DIAGNOSIS — I10 Essential (primary) hypertension: Secondary | ICD-10-CM | POA: Diagnosis not present

## 2020-03-31 ENCOUNTER — Other Ambulatory Visit: Payer: Self-pay | Admitting: Physician Assistant

## 2020-04-02 ENCOUNTER — Ambulatory Visit (INDEPENDENT_AMBULATORY_CARE_PROVIDER_SITE_OTHER): Payer: Medicare HMO | Admitting: *Deleted

## 2020-04-02 ENCOUNTER — Other Ambulatory Visit: Payer: Self-pay

## 2020-04-02 DIAGNOSIS — Z5181 Encounter for therapeutic drug level monitoring: Secondary | ICD-10-CM | POA: Diagnosis not present

## 2020-04-02 DIAGNOSIS — I513 Intracardiac thrombosis, not elsewhere classified: Secondary | ICD-10-CM

## 2020-04-02 LAB — POCT INR: INR: 2.7 (ref 2.0–3.0)

## 2020-04-02 NOTE — Patient Instructions (Signed)
Tablet decreased from 7.5mg  to 2mg  Continue warfarin 1/2 tablet daily except 1 tablet on Mondays, Wednesdays and Fridays Recheck in 4 wks

## 2020-04-03 DIAGNOSIS — I5040 Unspecified combined systolic (congestive) and diastolic (congestive) heart failure: Secondary | ICD-10-CM | POA: Diagnosis not present

## 2020-04-03 DIAGNOSIS — J449 Chronic obstructive pulmonary disease, unspecified: Secondary | ICD-10-CM | POA: Diagnosis not present

## 2020-04-07 DIAGNOSIS — K2100 Gastro-esophageal reflux disease with esophagitis, without bleeding: Secondary | ICD-10-CM | POA: Diagnosis not present

## 2020-04-07 DIAGNOSIS — Z955 Presence of coronary angioplasty implant and graft: Secondary | ICD-10-CM | POA: Diagnosis not present

## 2020-04-07 DIAGNOSIS — I10 Essential (primary) hypertension: Secondary | ICD-10-CM | POA: Diagnosis not present

## 2020-04-07 DIAGNOSIS — Z23 Encounter for immunization: Secondary | ICD-10-CM | POA: Diagnosis not present

## 2020-04-07 DIAGNOSIS — J449 Chronic obstructive pulmonary disease, unspecified: Secondary | ICD-10-CM | POA: Diagnosis not present

## 2020-04-07 DIAGNOSIS — F419 Anxiety disorder, unspecified: Secondary | ICD-10-CM | POA: Diagnosis not present

## 2020-04-18 DIAGNOSIS — I10 Essential (primary) hypertension: Secondary | ICD-10-CM | POA: Diagnosis not present

## 2020-04-18 DIAGNOSIS — Z720 Tobacco use: Secondary | ICD-10-CM | POA: Diagnosis not present

## 2020-04-18 DIAGNOSIS — J441 Chronic obstructive pulmonary disease with (acute) exacerbation: Secondary | ICD-10-CM | POA: Diagnosis not present

## 2020-05-04 ENCOUNTER — Telehealth: Payer: Self-pay

## 2020-05-04 DIAGNOSIS — J449 Chronic obstructive pulmonary disease, unspecified: Secondary | ICD-10-CM | POA: Diagnosis not present

## 2020-05-04 DIAGNOSIS — H353111 Nonexudative age-related macular degeneration, right eye, early dry stage: Secondary | ICD-10-CM | POA: Diagnosis not present

## 2020-05-04 DIAGNOSIS — I5040 Unspecified combined systolic (congestive) and diastolic (congestive) heart failure: Secondary | ICD-10-CM | POA: Diagnosis not present

## 2020-05-04 DIAGNOSIS — H353123 Nonexudative age-related macular degeneration, left eye, advanced atrophic without subfoveal involvement: Secondary | ICD-10-CM | POA: Diagnosis not present

## 2020-05-04 NOTE — Telephone Encounter (Signed)
-----   Message from Irving Copas., MD sent at 05/03/2020  2:20 PM EST ----- Regarding: Follow up Clifford Jones, This patient has not followed up since our Ampullectomy last year. He had an outside KUB that did not say that the stent was present but also that it was gone. At minimum get him in for a KUB 2-view to ensure that the previous pancreatic duct stent has migrated out. As well, he needs a follow up ERCP. He can be seen in clinic if necessary but OK to proceed with scheduling the patient. Please update Dr. Laural Golden and myself to what you find out. Thanks. GM

## 2020-05-04 NOTE — Telephone Encounter (Signed)
Left message on machine to call back  

## 2020-05-05 NOTE — Telephone Encounter (Signed)
Left message on machine to call back  

## 2020-05-06 NOTE — Telephone Encounter (Signed)
Unable to reach the pt will mail letter

## 2020-05-08 DIAGNOSIS — J449 Chronic obstructive pulmonary disease, unspecified: Secondary | ICD-10-CM | POA: Diagnosis not present

## 2020-05-08 DIAGNOSIS — I10 Essential (primary) hypertension: Secondary | ICD-10-CM | POA: Diagnosis not present

## 2020-05-08 DIAGNOSIS — F419 Anxiety disorder, unspecified: Secondary | ICD-10-CM | POA: Diagnosis not present

## 2020-05-08 DIAGNOSIS — Z955 Presence of coronary angioplasty implant and graft: Secondary | ICD-10-CM | POA: Diagnosis not present

## 2020-05-11 ENCOUNTER — Ambulatory Visit (INDEPENDENT_AMBULATORY_CARE_PROVIDER_SITE_OTHER): Payer: Medicare HMO | Admitting: *Deleted

## 2020-05-11 DIAGNOSIS — I513 Intracardiac thrombosis, not elsewhere classified: Secondary | ICD-10-CM | POA: Diagnosis not present

## 2020-05-11 DIAGNOSIS — Z5181 Encounter for therapeutic drug level monitoring: Secondary | ICD-10-CM | POA: Diagnosis not present

## 2020-05-11 LAB — POCT INR: INR: 2.4 (ref 2.0–3.0)

## 2020-05-11 NOTE — Patient Instructions (Signed)
Continue warfarin 1/2 tablet daily except 1 tablet on Mondays, Wednesdays and Fridays Recheck in 6 wks 

## 2020-05-19 DIAGNOSIS — Z720 Tobacco use: Secondary | ICD-10-CM | POA: Diagnosis not present

## 2020-05-19 DIAGNOSIS — J441 Chronic obstructive pulmonary disease with (acute) exacerbation: Secondary | ICD-10-CM | POA: Diagnosis not present

## 2020-05-19 DIAGNOSIS — I10 Essential (primary) hypertension: Secondary | ICD-10-CM | POA: Diagnosis not present

## 2020-05-26 IMAGING — DX DG CHEST 1V PORT
1 series · 1 of 1 positions shown · non-contrast
Comparison: 04/07/2016

CLINICAL DATA: Sepsis

EXAM:
PORTABLE CHEST 1 VIEW

[chest ap]
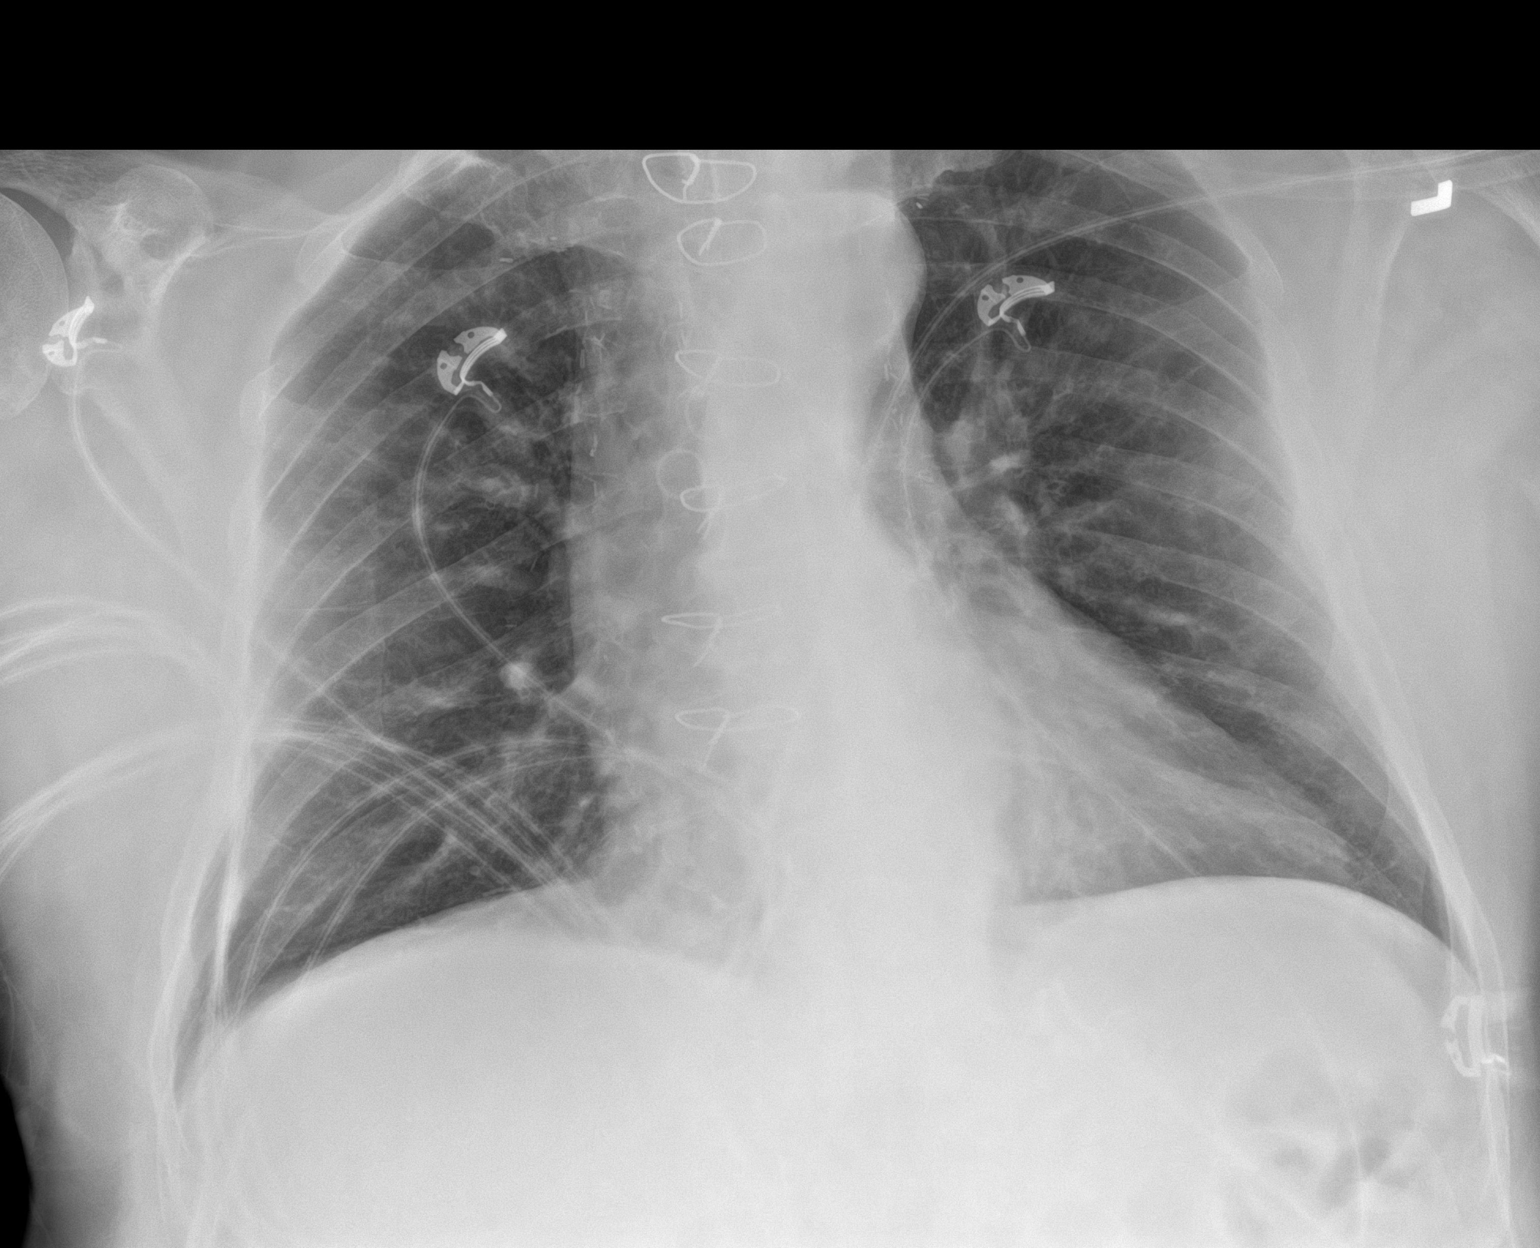

[1 of 1 positions shown; findings below may reference images not displayed]

FINDINGS: The heart size is significantly enlarged. The patient is status post
prior median sternotomy. Scattered pulmonary opacities are noted
bilaterally but greatest in the right upper and right mid lung
zones. The lungs are hyperexpanded. There is no significant pleural
effusion. There is no pneumothorax.
IMPRESSION: 1. Scattered pulmonary opacities bilaterally, greatest in the right
upper and right mid lung zones. Findings are concerning for
multifocal pneumonia (viral or bacterial).
2. Cardiomegaly.

## 2020-06-01 ENCOUNTER — Other Ambulatory Visit: Payer: Self-pay | Admitting: Physician Assistant

## 2020-06-03 NOTE — Telephone Encounter (Signed)
Rx has been sent to the pharmacy electronically. ° °

## 2020-06-19 DIAGNOSIS — J441 Chronic obstructive pulmonary disease with (acute) exacerbation: Secondary | ICD-10-CM | POA: Diagnosis not present

## 2020-06-19 DIAGNOSIS — I10 Essential (primary) hypertension: Secondary | ICD-10-CM | POA: Diagnosis not present

## 2020-06-19 DIAGNOSIS — Z720 Tobacco use: Secondary | ICD-10-CM | POA: Diagnosis not present

## 2020-06-23 ENCOUNTER — Ambulatory Visit (INDEPENDENT_AMBULATORY_CARE_PROVIDER_SITE_OTHER): Payer: Medicare HMO | Admitting: *Deleted

## 2020-06-23 DIAGNOSIS — I513 Intracardiac thrombosis, not elsewhere classified: Secondary | ICD-10-CM

## 2020-06-23 DIAGNOSIS — Z5181 Encounter for therapeutic drug level monitoring: Secondary | ICD-10-CM | POA: Diagnosis not present

## 2020-06-23 LAB — POCT INR: INR: 1.9 — AB (ref 2.0–3.0)

## 2020-06-23 NOTE — Patient Instructions (Signed)
Take 1 tablet tonight then resume 1/2 tablet daily except 1 tablet on Mondays, Wednesdays and Fridays Recheck in 6 wks

## 2020-07-07 ENCOUNTER — Telehealth: Payer: Self-pay

## 2020-07-07 NOTE — Telephone Encounter (Signed)
Left message on machine to call back  

## 2020-07-07 NOTE — Telephone Encounter (Signed)
See alternate note Dr Laural Golden has also tried to reach the pt without success.  Dr Rush Landmark has been made aware.

## 2020-07-07 NOTE — Telephone Encounter (Signed)
-----   Message from Irving Copas., MD sent at 07/07/2020  3:54 AM EST ----- Clifford Jones, anyway to check and see if this patient could come in and get that x-ray.  Also happy to see him in clinic since it has been a long time since our procedure and he is never wanted to follow-up. FYI Dr. Laural Golden. GM ----- Message ----- From: SYSTEM Sent: 07/07/2020  12:11 AM EST To: Irving Copas., MD

## 2020-07-08 DIAGNOSIS — Z23 Encounter for immunization: Secondary | ICD-10-CM | POA: Diagnosis not present

## 2020-07-18 DIAGNOSIS — J441 Chronic obstructive pulmonary disease with (acute) exacerbation: Secondary | ICD-10-CM | POA: Diagnosis not present

## 2020-07-18 DIAGNOSIS — Z720 Tobacco use: Secondary | ICD-10-CM | POA: Diagnosis not present

## 2020-07-18 DIAGNOSIS — I10 Essential (primary) hypertension: Secondary | ICD-10-CM | POA: Diagnosis not present

## 2020-07-28 ENCOUNTER — Other Ambulatory Visit: Payer: Self-pay | Admitting: Family Medicine

## 2020-08-03 NOTE — Progress Notes (Signed)
Cardiology Office Note  Date: 08/04/2020   ID: Clifford Jones, Clifford Jones 20-Mar-1951, MRN [id number]  PCP:  Lavella Lemons, PA  Cardiologist:  Kate Sable, MD (Inactive) Electrophysiologist:  None   Chief Complaint: Follow-up CAD  History of Present Illness: Clifford Jones is a 70 y.o. male with a history of CAD (status post CABG 2003 wtih NSTEMI in 2008 and DES to marginal, NSTEMI 2013 with DES to OM, and NSTEMI 2015 with DES to RPAL), HTN, HLD, Hx of tobacco use.  Last encounter with Dr Bronson Ing 09/03/2019 via telemedicine. He had not seen the patient since 2017. Admit for PNA and COPD 05/2019. Was having CP every 2-3 days, mild 2/10. NTG x 1 relieved. Had been on Amlodipine and higher dose of Lisinopril prior to hospitalization for PNA. He was requesting Amlodipine during that visit. Taking Lasix  40 mg daily and prn  for LE edema.  He presents today for 78-month follow-up.  He denies any recent acute illnesses or hospitalizations.  States he has received all 3 Covid vaccines.  Denies any anginal or exertional symptoms, palpitations or arrhythmias, orthostatic symptoms, stroke or TIA-like symptoms, PND, orthopnea, bleeding in stool or urine.  Denies any claudication-like symptoms, DVT or PE-like symptoms, or lower extremity edema.  Blood pressure is elevated on arrival at 158/88.  States he has been taking his antihypertensive medications as directed.  He has a pending appointment with his primary care provider and will have lab work during that visit.  States he still smokes on and off on occasion.   Past Medical History:  Diagnosis Date  . Ampullary adenoma   . Arthritis    Reported by patient  . Cancer [facility])    Reported by patient  . Claustrophobia   . Closed fracture of right distal tibia   . COPD (chronic obstructive pulmonary disease) ([place])    Reported by patient  . Coronary artery disease    a. 1999 Inf MI;  b. 2003 CABGx6;  c. 2008 NSTEMI, occluded SVG to marginal and  diagonal system. 2 DES placed to marginal system;  c. NSTEMI - 02/2012 Xience Xpedition DES to oS-OM and POBA to mid lesion;  c. 10/2013 NSTEMI/PCI: VG->OM 80 ISR, native OM 80/90 small, LIMA->LAD ok w/ 90 dLAD, D1 80, RIMA->RPL ok w/ 95 in RPL (2.5x14 Resolute DES), nl EF.  Marland Kitchen Dyspnea    Reported by patient  . GERD (gastroesophageal reflux disease)   . Hyperlipidemia   . Hypertension   . Myocardial infarction ([place])    X 6  . Pneumonia   . Presence of permanent cardiac pacemaker   . S/P AAA repair   . Tobacco abuse   . Wears dentures   . Wears glasses     Past Surgical History:  Procedure Laterality Date  . ABDOMINAL AORTIC ANEURYSM REPAIR  2006  . APPENDECTOMY    . CORONARY ANGIOPLASTY WITH STENT PLACEMENT    . CORONARY ARTERY BYPASS GRAFT  2003  . ENDOSCOPIC RETROGRADE CHOLANGIOPANCREATOGRAPHY (ERCP) WITH PROPOFOL N/A 03/25/2019   Procedure: ENDOSCOPIC RETROGRADE CHOLANGIOPANCREATOGRAPHY (ERCP) WITH PROPOFOL;  Surgeon: Rush Landmark Telford Nab., MD;  Location: [place];  Service: Gastroenterology;  Laterality: N/A;  . ESOPHAGEAL DILATION  07/06/2018   Procedure: ESOPHAGEAL DILATION;  Surgeon: Rogene Houston, MD;  [facility];  Service: Endoscopy;;  . ESOPHAGOGASTRODUODENOSCOPY N/A 07/06/2018   Procedure: ESOPHAGOGASTRODUODENOSCOPY (EGD);  Surgeon: Rogene Houston, MD;  [facility];  Service: Endoscopy;  Laterality: N/A;  .  ESOPHAGOGASTRODUODENOSCOPY N/A 08/16/2018   Procedure: ESOPHAGOGASTRODUODENOSCOPY (EGD);  Surgeon: Milus Banister, MD;  Location: [facility];  Service: Endoscopy;  Laterality: N/A;  . ESOPHAGOGASTRODUODENOSCOPY (EGD) WITH PROPOFOL N/A 03/25/2019   Procedure: ESOPHAGOGASTRODUODENOSCOPY (EGD) WITH PROPOFOL;  Surgeon: Rush Landmark Telford Nab., MD;  Location: [place];  Service: Gastroenterology;  Laterality: N/A;  . EUS N/A 08/16/2018   Procedure: UPPER ENDOSCOPIC ULTRASOUND (EUS) RADIAL;  Surgeon: Milus Banister, MD;  Location: [facility];  Service: Endoscopy;  Laterality: N/A;  . EUS N/A 03/25/2019   Procedure: UPPER ENDOSCOPIC ULTRASOUND (EUS) RADIAL;  Surgeon: Rush Landmark Telford Nab., MD;  Location: [place];  Service: Gastroenterology;  Laterality: N/A;  . FOREIGN BODY REMOVAL  07/06/2018   Procedure: FOREIGN BODY REMOVAL;  Surgeon: Rogene Houston, MD;  [facility];  Service: Endoscopy;;  . HERNIA REPAIR     umbicical  . LEFT HEART CATHETERIZATION WITH CORONARY ANGIOGRAM N/A 03/05/2012   Procedure: LEFT HEART CATHETERIZATION WITH CORONARY ANGIOGRAM;  Surgeon: Hillary Bow, MD;  Location: [facility] CATH LAB;  Service: Cardiovascular;  Laterality: N/A;  . LEFT HEART CATHETERIZATION WITH CORONARY ANGIOGRAM N/A 10/23/2013   Procedure: LEFT HEART CATHETERIZATION WITH CORONARY ANGIOGRAM;  Surgeon: Blane Ohara, MD;  Location: [facility] CATH LAB;  Service: Cardiovascular;  Laterality: N/A;  . LEFT HEART CATHETERIZATION WITH CORONARY/GRAFT ANGIOGRAM  10/22/2013   Procedure: LEFT HEART CATHETERIZATION WITH [place] Fetters;  Surgeon: Leonie Man, MD;  Location: [facility] CATH LAB;  Service: Cardiovascular;;  . PANCREATIC STENT PLACEMENT  03/25/2019   Procedure: PANCREATIC STENT PLACEMENT;  Surgeon: Rush Landmark Telford Nab., MD;  Location: [place];  Service: Gastroenterology;;  . PERCUTANEOUS CORONARY STENT INTERVENTION (PCI-S) N/A 03/07/2012   Procedure: PERCUTANEOUS CORONARY STENT INTERVENTION (PCI-S);  Surgeon: Sherren Mocha, MD;  Location: [facility] CATH LAB;  Service: Cardiovascular;  Laterality: N/A;  . PERCUTANEOUS CORONARY STENT INTERVENTION (PCI-S) Right 10/23/2013   Procedure: PERCUTANEOUS CORONARY STENT INTERVENTION (PCI-S);  Surgeon: Blane Ohara, MD;  Location: [facility] CATH LAB;  Service: Cardiovascular;  Laterality: Right;  . POLYPECTOMY  07/06/2018   Procedure: POLYPECTOMY;  Surgeon: Rogene Houston, MD;  [facility];  Service: Endoscopy;;  polyp at ampulla  . POLYPECTOMY  03/25/2019   Procedure:  AMPULLECTOMY;  Surgeon: Mansouraty, Telford Nab., MD;  Location: [place];  Service: Gastroenterology;;  . Right hand surgery    . RIGHT HEART CATH AND CORONARY/GRAFT ANGIOGRAPHY N/A 12/16/2019   Procedure: RIGHT HEART CATH AND CORONARY/GRAFT ANGIOGRAPHY;  Surgeon: Nelva Bush, MD;  Location: [place] CV LAB;  Service: Cardiovascular;  Laterality: N/A;  . SPHINCTEROTOMY  03/25/2019   Procedure: SPHINCTEROTOMY;  Surgeon: Rush Landmark Telford Nab., MD;  Location: [place];  Service: Gastroenterology;;  . TIBIA IM NAIL INSERTION Right 04/07/2016   Procedure: FIXATION OF RIGHT TIBIA FRACTURE;  Surgeon: Garald Balding, MD;  Location: [place];  Service: Orthopedics;  Laterality: Right;    Current Outpatient Medications  Medication Sig Dispense Refill  . acetaminophen (TYLENOL) 500 MG tablet Take 1,000 mg by mouth every 6 (six) hours as needed for mild pain, moderate pain or headache.     Marland Kitchen aspirin 81 MG EC tablet Take 1 tablet (81 mg total) by mouth daily. 30 tablet 11  . budesonide (PULMICORT) 0.5 MG/2ML nebulizer solution Take 2 mLs (0.5 mg total) by nebulization 2 (two) times daily. 120 mL 1  . famotidine (PEPCID) 20 MG tablet Take 20 mg by mouth 2 (two) times daily as needed for heartburn or indigestion (or bloating).     Marland Kitchen  furosemide (LASIX) 20 MG tablet TAKE 1 TABLET BY MOUTH EVERY DAY 90 tablet 1  . ipratropium-albuterol (DUONEB) 0.5-2.5 (3) MG/3ML SOLN Inhale 3 mLs into the lungs every 6 (six) hours. (Patient taking differently: Inhale 3 mLs into the lungs every 6 (six) hours as needed (for wheezing or shortness of breath).) 360 mL 3  . isosorbide mononitrate (IMDUR) 30 MG 24 hr tablet Take 1 tablet (30 mg total) by mouth daily. 90 tablet 1  . lisinopril (ZESTRIL) 2.5 MG tablet Take 1 tablet (2.5 mg total) by mouth daily. 60 tablet 1  . nitroGLYCERIN (NITROSTAT) 0.4 MG SL tablet Place 1 tablet (0.4 mg total) under the tongue every 5 (five) minutes x 3 doses as needed for chest pain.  25 tablet 4  . pantoprazole (PROTONIX) 40 MG tablet TAKE 1 TABLET BY MOUTH TWICE DAILY (Patient taking differently: Take 40 mg by mouth 2 (two) times daily.) 60 tablet 3  . RANEXA 1000 MG SR tablet TAKE ONE TABLET BY MOUTH TWICE DAILY (Patient taking differently: Take 1,000 mg by mouth 2 (two) times daily with a meal.) 30 tablet 0  . rosuvastatin (CRESTOR) 40 MG tablet Take 1 tablet (40 mg total) by mouth at bedtime. 90 tablet 1  . warfarin (COUMADIN) 2 MG tablet Take 1/2 tablet daily except 1 tablet on Mondays, Wednesdays and Fridays or as directed 45 tablet 3  . amLODipine (NORVASC) 5 MG tablet Take 1 tablet (5 mg total) by mouth daily. 30 tablet 6  . carvedilol (COREG) 3.125 MG tablet Take 1 tablet (3.125 mg total) by mouth 2 (two) times daily. 60 tablet 6   No current facility-administered medications for this visit.   Allergies:  Patient has no known allergies.   Social History: The patient  reports that he has been smoking cigarettes. He started smoking about 55 years ago. He has a 54.00 pack-year smoking history. He has never used smokeless tobacco. He reports current alcohol use. He reports that he does not use drugs.   Family History: The patient's family history includes Coronary artery disease in an other family member.   ROS:  Please see the history of present illness. Otherwise, complete review of systems is positive for none.  All other systems are reviewed and negative.   Physical Exam: VS:  BP (!) 158/88   Pulse 65   Ht 6' (1.829 m)   Wt 245 lb 6.4 oz (111.3 kg)   SpO2 90%   BMI 33.28 kg/m , BMI Body mass index is 33.28 kg/m.  Wt Readings from Last 3 Encounters:  08/04/20 245 lb 6.4 oz (111.3 kg)  12/25/19 232 lb 6.4 oz (105.4 kg)  12/17/19 231 lb 9.6 oz (105.1 kg)    General: Patient appears comfortable at rest. Neck: Supple, no elevated JVP or carotid bruits, no thyromegaly. Lungs: Clear to auscultation, nonlabored breathing at rest. Cardiac: Regular rate and  rhythm, no S3 or significant systolic murmur, no pericardial rub. Extremities: No pitting edema, distal pulses 2+. Skin: Warm and dry.  Right and left radial access sites for catheterization clean and dry with no redness swelling or ecchymosis Musculoskeletal: No kyphosis. Neuropsychiatric: Alert and oriented x3, affect grossly appropriate.  ECG:  EKG December 16, 2019 sinus bradycardia with sinus arrhythmia, nonspecific ST and T wave abnormality.  Heart rate of 55  Recent Labwork: 12/14/2019: ALT 16; AST 19; B Natriuretic Peptide 181.5; TSH 0.871 12/17/2019: BUN 25; Creatinine, Ser 0.99; Hemoglobin 13.7; Platelets 148; Potassium 4.0; Sodium 135  Component Value Date/Time   CHOL 143 12/15/2019 0236   TRIG 168 (H) 12/15/2019 0236   HDL 57 12/15/2019 0236   CHOLHDL 2.5 12/15/2019 0236   VLDL 34 12/15/2019 0236   LDLCALC 52 12/15/2019 0236    Other Studies Reviewed Today:  Echocardiogram 12/15/2019 IMPRESSIONS  1. Possible small apical left ventricular thrombus (6 x 6 mm). Left  ventricular ejection fraction, by estimation, is 50 to 55%. The left  ventricle has low normal function. The left ventricle demonstrates  regional wall motion abnormalities (see scoring  diagram/findings for description). There is mild concentric left  ventricular hypertrophy. Left ventricular diastolic parameters are  consistent with Grade II diastolic dysfunction (pseudonormalization).  Elevated left atrial pressure. There is mild  dyskinesis of the left ventricular, entire apical segment.  2. Right ventricular systolic function is normal. The right ventricular  size is normal. There is normal pulmonary artery systolic pressure. The  estimated right ventricular systolic pressure is 44.3 mmHg.  3. The mitral valve is normal in structure. No evidence of mitral valve  regurgitation.  4. The aortic valve is tricuspid. Aortic valve regurgitation is not  visualized. Mild to moderate aortic valve  sclerosis/calcification is  present, without any evidence of aortic stenosis.  5. The inferior vena cava is normal in size with greater than 50%  respiratory variability, suggesting right atrial pressure of 3 mmHg.   Comparison(s): A prior study was performed on 01/30/2019. Prior images  reviewed side by side. Although not reported on the previous study  (performed without Definity), there is evidence of an apical wall motion  abnormality on that study as well (cannot  evaluate for LV apical thrombus on prior study).   Conclusion(s)/Recommendation(s): The patient is currently receiving IV  heparin.     Cath 12/16/2019 Conclusions: 1. Severe native coronary artery disease, including 80% distal LMCA stenosis with heavy calcification involving the ostium of the LCx where there is up to 90% stenosis, chronic total occlusions of the mid and distal LAD, 80% D1 stenosis as well as chronic total occlusions of ramus intermedius, distal LCx, and proximal RCA. 2. Widely patent LIMA-LAD (distal LAD is occluded beyond LIMA anastomosis) and RIMA-distal RCA. 3. Chronically occluded sequential SVG-D1-D2, and SVG-OM. SVG to ramus could not be engaged but is likely occluded given non-visualization with ascending aortogram and collateral filling of the ramus. 4. Mildly elevated left heart filling pressure (PCWP 20-25 mmHg). 5. Moderately to severely elevated right heart filling pressure (RA 15 mmHg). 6. Moderate pulmonary hypertension (mean PAP 36 mmHg). 7. Normal Fick cardiac output/index.  Recommendations: 1. Gentle diuresis. 2. Optimize antianginal therapy; I will add isosorbide mononitrate and continue current doses of amlodipine and ranolazine. 3. If the patient has refractory angina, only interventional target would be distal LMCA, though this would be very challenging given heavy calcification and severe stenosis of ostial LCx. PCI of D1 would also need to be considered at that time. However, I  am not sure that benefits of the procedure would outweigh potential risks at this time. 4. Restart heparin infusion 2 hours after TR band removal. Consider transition to warfarin if concern for apical thrombus persists (in addition to aspirin). Diagnostic Dominance: Right     Echocardiogram: 01/30/2019 IMPRESSIONS  1. The left ventricle has normal systolic function, with an ejection fraction of 55-60%. The cavity size was normal. Mild basal septal hypertrophy. Diastolic dysfunction, grade indeterminate. Indeterminate filling pressures No evidence of left  ventricular regional wall motion abnormalities. 2. The mitral valve is  grossly normal. 3. The tricuspid valve is grossly normal. 4. The aortic valve is tricuspid. Mild thickening of the aortic valve. Sclerosis without any evidence of stenosis of the aortic valve. 5. The aorta is normal unless otherwise noted. 6. The inferior vena cava was dilated in size with >50% respiratory variability. 7. The interatrial septum appears to be lipomatous   NST 03/02/15:   There was no ST segment deviation noted during stress.  T wave inversion of 1 mm was noted during stress in the I, II, aVF, V5 and V6 leads. T wave inversion persisted.  Defect 1: There is a medium defect of severe severity present in the apical anterior, apical inferior and apex location.  Findings consistent with prior myocardial infarction. There is no evidence of ischemia.  This is an intermediate risk study.  Nuclear stress EF: 39%.     Assessment and Plan:   1. CAD in native artery No current anginal or exertional symptoms. Continue aspirin 81 mg, carvedilol 3.125 mg p.o. twice daily, Imdur 30 mg daily, sublingual nitroglycerin as needed, Ranexa 1000 mg p.o. twice daily.  Continue Coumadin per Coumadin clinic.  2. Hypertension Blood pressure is elevated today.  He states his blood pressure is up and down.  Please increase amlodipine to 5 mg  daily.   3. Chronic diastolic heart failure (HCC) No obvious weight gain, shortness of breath or lower extremity edema noted. Continue furosemide 20 mg daily.  Carvedilol 3.125 mg p.o. twice daily, Imdur 30 mg daily.  4. Mixed hyperlipidemia Continue Crestor 40 mg daily.  Recent lipid panel 10 days ago showed TC 143, TG 168, HDL 57, LDL 52  5. LV thrombus Continue warfarin 7.5 mg for LV mural thrombus.  Please get a follow-up echocardiogram to reassess for resolution of LV thrombus   Medication Adjustments/Labs and Tests Ordered: Current medicines are reviewed at length with the patient today.  Concerns regarding medicines are outlined above.   Disposition: Follow-up with Dr. Bronson Ing or APP 6 months  Signed, Levell July, NP 08/04/2020 12:00 PM    [facility] [place], [place], [place] [address] Phone: [phone]; Fax: [phone]

## 2020-08-04 ENCOUNTER — Encounter: Payer: Self-pay | Admitting: Family Medicine

## 2020-08-04 ENCOUNTER — Ambulatory Visit: Payer: Medicare HMO | Admitting: Family Medicine

## 2020-08-04 ENCOUNTER — Ambulatory Visit (INDEPENDENT_AMBULATORY_CARE_PROVIDER_SITE_OTHER): Payer: Medicare HMO | Admitting: *Deleted

## 2020-08-04 VITALS — BP 158/88 | HR 65 | Ht 72.0 in | Wt 245.4 lb

## 2020-08-04 DIAGNOSIS — Z5181 Encounter for therapeutic drug level monitoring: Secondary | ICD-10-CM | POA: Diagnosis not present

## 2020-08-04 DIAGNOSIS — E782 Mixed hyperlipidemia: Secondary | ICD-10-CM | POA: Diagnosis not present

## 2020-08-04 DIAGNOSIS — I513 Intracardiac thrombosis, not elsewhere classified: Secondary | ICD-10-CM | POA: Diagnosis not present

## 2020-08-04 DIAGNOSIS — I5032 Chronic diastolic (congestive) heart failure: Secondary | ICD-10-CM | POA: Diagnosis not present

## 2020-08-04 DIAGNOSIS — I2510 Atherosclerotic heart disease of native coronary artery without angina pectoris: Secondary | ICD-10-CM

## 2020-08-04 DIAGNOSIS — I10 Essential (primary) hypertension: Secondary | ICD-10-CM

## 2020-08-04 LAB — POCT INR: INR: 1.9 — AB (ref 2.0–3.0)

## 2020-08-04 MED ORDER — CARVEDILOL 3.125 MG PO TABS: 3.1250 mg | ORAL_TABLET | Freq: Two times a day (BID) | ORAL | 6 refills | Status: DC

## 2020-08-04 MED ORDER — AMLODIPINE BESYLATE 5 MG PO TABS: 5.0000 mg | ORAL_TABLET | Freq: Every day | ORAL | 6 refills | Status: DC

## 2020-08-04 NOTE — Patient Instructions (Addendum)
Medication Instructions:   Increase Amlodipine to 5mg  daily.   Coreg 3.125mg  twice a day also refilled.  Continue all other medications.    Labwork: none  Testing/Procedures:  Your physician has requested that you have an echocardiogram. Echocardiography is a painless test that uses sound waves to create images of your heart. It provides your doctor with information about the size and shape of your heart and how well your heart's chambers and valves are working. This procedure takes approximately one hour. There are no restrictions for this procedure.  Office will contact with results via phone or letter.     Follow-Up: 6 months   Any Other Special Instructions Will Be Listed Below (If Applicable).  If you need a refill on your cardiac medications before your next appointment, please call your pharmacy.

## 2020-08-04 NOTE — Patient Instructions (Signed)
Take 1 tablet tonight then increase dose to 1 tablet daily except 1/2 tablet on Tuesdays, Thursdays and Saturdays Recheck in 6 wks

## 2020-08-05 ENCOUNTER — Telehealth: Payer: Self-pay | Admitting: Family Medicine

## 2020-08-05 DIAGNOSIS — R739 Hyperglycemia, unspecified: Secondary | ICD-10-CM | POA: Diagnosis not present

## 2020-08-05 DIAGNOSIS — I10 Essential (primary) hypertension: Secondary | ICD-10-CM | POA: Diagnosis not present

## 2020-08-05 DIAGNOSIS — K2100 Gastro-esophageal reflux disease with esophagitis, without bleeding: Secondary | ICD-10-CM | POA: Diagnosis not present

## 2020-08-05 DIAGNOSIS — R5383 Other fatigue: Secondary | ICD-10-CM | POA: Diagnosis not present

## 2020-08-05 DIAGNOSIS — Z13220 Encounter for screening for lipoid disorders: Secondary | ICD-10-CM | POA: Diagnosis not present

## 2020-08-05 NOTE — Telephone Encounter (Signed)
Pre-cert Verification for the following procedure    ECHO WITH CONTRAST   DATE:   08/11/2020  LOCATION: [facility]

## 2020-08-10 DIAGNOSIS — Z1389 Encounter for screening for other disorder: Secondary | ICD-10-CM | POA: Diagnosis not present

## 2020-08-10 DIAGNOSIS — F419 Anxiety disorder, unspecified: Secondary | ICD-10-CM | POA: Diagnosis not present

## 2020-08-10 DIAGNOSIS — Z955 Presence of coronary angioplasty implant and graft: Secondary | ICD-10-CM | POA: Diagnosis not present

## 2020-08-10 DIAGNOSIS — Z1331 Encounter for screening for depression: Secondary | ICD-10-CM | POA: Diagnosis not present

## 2020-08-10 DIAGNOSIS — J449 Chronic obstructive pulmonary disease, unspecified: Secondary | ICD-10-CM | POA: Diagnosis not present

## 2020-08-10 DIAGNOSIS — I10 Essential (primary) hypertension: Secondary | ICD-10-CM | POA: Diagnosis not present

## 2020-08-10 DIAGNOSIS — E7801 Familial hypercholesterolemia: Secondary | ICD-10-CM | POA: Diagnosis not present

## 2020-08-10 DIAGNOSIS — K219 Gastro-esophageal reflux disease without esophagitis: Secondary | ICD-10-CM | POA: Diagnosis not present

## 2020-08-11 ENCOUNTER — Ambulatory Visit (HOSPITAL_COMMUNITY)
Admission: RE | Admit: 2020-08-11 | Discharge: 2020-08-11 | Disposition: A | Discharge status: Home / Self Care | Payer: Medicare HMO | Source: Ambulatory Visit | Attending: Family Medicine | Admitting: Family Medicine

## 2020-08-11 ENCOUNTER — Other Ambulatory Visit: Payer: Self-pay

## 2020-08-11 DIAGNOSIS — I513 Intracardiac thrombosis, not elsewhere classified: Secondary | ICD-10-CM | POA: Diagnosis not present

## 2020-08-11 LAB — ECHOCARDIOGRAM COMPLETE
Area-P 1/2: 3.53 cm2
S' Lateral: 3.8 cm

## 2020-08-11 MED ORDER — PERFLUTREN LIPID MICROSPHERE
1.0000 mL | INTRAVENOUS | Status: AC | PRN
  Administered 2020-08-11: 2 mL via INTRAVENOUS

## 2020-08-11 NOTE — Progress Notes (Signed)
*  PRELIMINARY RESULTS* Echocardiogram 2D Echocardiogram has been performed with Definity.  Samuel Germany 08/11/2020, 9:29 AM

## 2020-08-13 ENCOUNTER — Telehealth: Payer: Self-pay | Admitting: *Deleted

## 2020-08-13 NOTE — Telephone Encounter (Signed)
-----   Message from Verta Ellen., NP sent at 08/12/2020  7:24 PM EST ----- Please call the patient and let him know the pumping function of his heart looks good 50 to 55% which is considered low normal.  This is is unchanged from previous study.  His left pumping chamber is mildly thickened and muscular.  This is unchanged from previous study. Better blood pressure control will help this to get better.  There was no blood clot seen in the left ventricle when compared to the previous echocardiogram.  He has 2 very mildly leaking valves

## 2020-08-13 NOTE — Telephone Encounter (Signed)
Laurine Blazer, LPN  09/16/5186 4:16 PM EST Back to Top     Notified, copy to pcp.

## 2020-08-27 ENCOUNTER — Other Ambulatory Visit: Payer: Medicare HMO

## 2020-09-15 ENCOUNTER — Ambulatory Visit (INDEPENDENT_AMBULATORY_CARE_PROVIDER_SITE_OTHER): Payer: Medicare HMO | Admitting: *Deleted

## 2020-09-15 DIAGNOSIS — Z5181 Encounter for therapeutic drug level monitoring: Secondary | ICD-10-CM

## 2020-09-15 DIAGNOSIS — I513 Intracardiac thrombosis, not elsewhere classified: Secondary | ICD-10-CM | POA: Diagnosis not present

## 2020-09-15 LAB — POCT INR: INR: 2.3 (ref 2.0–3.0)

## 2020-09-15 NOTE — Patient Instructions (Signed)
Continue warfarin 1 tablet daily except 1/2 tablet on Tuesdays, Thursdays and Saturdays Recheck in 6 wks 

## 2020-09-16 DIAGNOSIS — Z720 Tobacco use: Secondary | ICD-10-CM | POA: Diagnosis not present

## 2020-09-16 DIAGNOSIS — I10 Essential (primary) hypertension: Secondary | ICD-10-CM | POA: Diagnosis not present

## 2020-09-16 DIAGNOSIS — J441 Chronic obstructive pulmonary disease with (acute) exacerbation: Secondary | ICD-10-CM | POA: Diagnosis not present

## 2020-10-12 ENCOUNTER — Other Ambulatory Visit: Payer: Self-pay | Admitting: Physician Assistant

## 2020-10-14 ENCOUNTER — Telehealth: Payer: Self-pay

## 2020-10-14 NOTE — Telephone Encounter (Signed)
-----   Message from Irving Copas., MD sent at 10/13/2020  5:09 PM EDT ----- Regarding: PD stent follow-up Margot Oriordan, I saw you send a letter to the patient since we were not able to get a hold of him to get his KUB to ensure the PD stent has fallen out and to also ensure that we get him set up for follow-up ERCP or follow-up in clinic at a minimum.  I would work on sending a repeat certified letter saying that we need to see him in the office.  We need to ensure he gets his x-ray of his abdomen to ensure that the stent placed in his pancreas has fallen out if it has not then he needs an endoscopy to have that removed.  We are sending this certified letter in an effort of trying to reach him.  Also send it to his PCP office so that they can also help with trying to get this patient to follow-up.  At that point if he does not return our call or follow-up we have done our due diligence in trying to help him. Thanks. GM

## 2020-10-15 NOTE — Telephone Encounter (Signed)
The letter has been sent as certified to the pt and a copy sent to the PCP.  The letter has also been sent to the pt via My Chart.

## 2020-10-17 DIAGNOSIS — J441 Chronic obstructive pulmonary disease with (acute) exacerbation: Secondary | ICD-10-CM | POA: Diagnosis not present

## 2020-10-17 DIAGNOSIS — I10 Essential (primary) hypertension: Secondary | ICD-10-CM | POA: Diagnosis not present

## 2020-10-17 DIAGNOSIS — Z720 Tobacco use: Secondary | ICD-10-CM | POA: Diagnosis not present

## 2020-10-19 ENCOUNTER — Other Ambulatory Visit: Payer: Self-pay

## 2020-10-19 DIAGNOSIS — Z9889 Other specified postprocedural states: Secondary | ICD-10-CM

## 2020-10-19 NOTE — Telephone Encounter (Signed)
The pt has been advised and will come in for KUB and also has an appt with GM on 11/18/20 at 2:10 pm.  He will come in this week for xray.

## 2020-10-19 NOTE — Telephone Encounter (Signed)
Inbound call from patient returning nurse's call.

## 2020-10-19 NOTE — Telephone Encounter (Signed)
Left message on machine to call back  

## 2020-10-26 ENCOUNTER — Other Ambulatory Visit: Payer: Self-pay

## 2020-10-26 ENCOUNTER — Ambulatory Visit (INDEPENDENT_AMBULATORY_CARE_PROVIDER_SITE_OTHER)
Admission: RE | Admit: 2020-10-26 | Discharge: 2020-10-26 | Disposition: A | Discharge status: Home / Self Care | Payer: Medicare HMO | Source: Ambulatory Visit | Attending: Gastroenterology | Admitting: Gastroenterology

## 2020-10-26 DIAGNOSIS — M160 Bilateral primary osteoarthritis of hip: Secondary | ICD-10-CM | POA: Diagnosis not present

## 2020-10-26 DIAGNOSIS — Z9689 Presence of other specified functional implants: Secondary | ICD-10-CM | POA: Diagnosis not present

## 2020-10-26 DIAGNOSIS — Z9889 Other specified postprocedural states: Secondary | ICD-10-CM

## 2020-10-27 ENCOUNTER — Ambulatory Visit (INDEPENDENT_AMBULATORY_CARE_PROVIDER_SITE_OTHER): Payer: Medicare HMO | Admitting: *Deleted

## 2020-10-27 DIAGNOSIS — I513 Intracardiac thrombosis, not elsewhere classified: Secondary | ICD-10-CM

## 2020-10-27 DIAGNOSIS — Z5181 Encounter for therapeutic drug level monitoring: Secondary | ICD-10-CM | POA: Diagnosis not present

## 2020-10-27 LAB — POCT INR: INR: 1.9 — AB (ref 2.0–3.0)

## 2020-10-27 NOTE — Patient Instructions (Signed)
Take 1 tablet tonight then resume 1 tablet daily except 1/2 tablet on Tuesdays, Thursdays and Saturdays Recheck in 6 wks

## 2020-11-16 DIAGNOSIS — I10 Essential (primary) hypertension: Secondary | ICD-10-CM | POA: Diagnosis not present

## 2020-11-16 DIAGNOSIS — J441 Chronic obstructive pulmonary disease with (acute) exacerbation: Secondary | ICD-10-CM | POA: Diagnosis not present

## 2020-11-16 DIAGNOSIS — Z720 Tobacco use: Secondary | ICD-10-CM | POA: Diagnosis not present

## 2020-11-18 ENCOUNTER — Encounter: Payer: Self-pay | Admitting: Gastroenterology

## 2020-11-18 ENCOUNTER — Other Ambulatory Visit (INDEPENDENT_AMBULATORY_CARE_PROVIDER_SITE_OTHER): Payer: Medicare HMO

## 2020-11-18 ENCOUNTER — Ambulatory Visit (INDEPENDENT_AMBULATORY_CARE_PROVIDER_SITE_OTHER): Payer: Medicare HMO | Admitting: Gastroenterology

## 2020-11-18 VITALS — BP 162/92 | HR 60 | Ht 72.0 in | Wt 239.0 lb

## 2020-11-18 DIAGNOSIS — Z9889 Other specified postprocedural states: Secondary | ICD-10-CM

## 2020-11-18 DIAGNOSIS — D135 Benign neoplasm of extrahepatic bile ducts: Secondary | ICD-10-CM | POA: Diagnosis not present

## 2020-11-18 LAB — CBC
HCT: 47.8 % (ref 39.0–52.0)
Hemoglobin: 15.8 g/dL (ref 13.0–17.0)
MCHC: 33.1 g/dL (ref 30.0–36.0)
MCV: 93.3 fl (ref 78.0–100.0)
Platelets: 228 10*3/uL (ref 150.0–400.0)
RBC: 5.13 Mil/uL (ref 4.22–5.81)
RDW: 13.9 % (ref 11.5–15.5)
WBC: 10.3 10*3/uL (ref 4.0–10.5)

## 2020-11-18 LAB — PROTIME-INR
INR: 1.8 ratio — ABNORMAL HIGH (ref 0.8–1.0)
Prothrombin Time: 20.4 s — ABNORMAL HIGH (ref 9.6–13.1)

## 2020-11-18 NOTE — Progress Notes (Signed)
[facility] VISIT   Primary Care Provider Lavella Lemons, PA [place] [address] [phone]  Patient Profile: Clifford Jones is a 70 y.o. male with a pmh significant for CAD status post CABG, hypertension, hyperlipidemia, status post AAA repair, tobacco use, GERD, Schatzki ring, ampullary adenoma of the duodenum.  The patient presents to the [facility] for an evaluation and management of problem(s) noted below:  Problem List 1. Ampullary adenoma   2. History of ERCP     History of Present Illness: Please see initial consultation for full details of HPI.  Interval History Today, the patient returns for a follow-up.  It has been over a year and a half since my last discussion or visit with the patient (at the time of his ampullectomy).  He underwent a KUB for follow-up which showed that his pancreas duct stent had fallen out.  Today he is accompanied by his family.  Patient states that he is doing well.  He has not had any other significant issues.  No issues of dysphagia at this time.  No pancreatitis episodes.  GI Review of Systems Positive as above Negative for odynophagia, nausea, vomiting, weight loss, change in bowel habits, melena, hematochezia  Review of Systems General: Denies fevers/chills/unintentional weight loss Cardiovascular: Denies chest pain Pulmonary: Denies significant changes in shortness of breath Gastroenterological: See HPI Genitourinary: Denies darkened urine Hematological: Denies easy bruising/bleeding Dermatological: Denies jaundice Psychological: Mood is stable   Medications Current Outpatient Medications  Medication Sig Dispense Refill  . acetaminophen (TYLENOL) 500 MG tablet Take 1,000 mg by mouth every 6 (six) hours as needed for mild pain, moderate pain or headache.     Marland Kitchen amLODipine (NORVASC) 5 MG tablet Take 1 tablet (5 mg total) by mouth daily. 30 tablet 6  . aspirin 81 MG EC  tablet Take 1 tablet (81 mg total) by mouth daily. 30 tablet 11  . budesonide (PULMICORT) 0.5 MG/2ML nebulizer solution Take 2 mLs (0.5 mg total) by nebulization 2 (two) times daily. 120 mL 1  . carvedilol (COREG) 3.125 MG tablet Take 1 tablet (3.125 mg total) by mouth 2 (two) times daily. 60 tablet 6  . furosemide (LASIX) 20 MG tablet TAKE 1 TABLET BY MOUTH EVERY DAY 90 tablet 1  . ipratropium-albuterol (DUONEB) 0.5-2.5 (3) MG/3ML SOLN Inhale 3 mLs into the lungs every 6 (six) hours. (Patient taking differently: Inhale 3 mLs into the lungs every 6 (six) hours as needed (for wheezing or shortness of breath).) 360 mL 3  . isosorbide mononitrate (IMDUR) 30 MG 24 hr tablet Take 1 tablet (30 mg total) by mouth daily. 90 tablet 1  . lisinopril (ZESTRIL) 2.5 MG tablet Take 1 tablet (2.5 mg total) by mouth daily. 60 tablet 1  . nitroGLYCERIN (NITROSTAT) 0.4 MG SL tablet Place 1 tablet (0.4 mg total) under the tongue every 5 (five) minutes x 3 doses as needed for chest pain. 25 tablet 4  . pantoprazole (PROTONIX) 40 MG tablet TAKE 1 TABLET BY MOUTH TWICE DAILY (Patient taking differently: Take 40 mg by mouth 2 (two) times daily.) 60 tablet 3  . RANEXA 1000 MG SR tablet TAKE ONE TABLET BY MOUTH TWICE DAILY (Patient taking differently: Take 1,000 mg by mouth 2 (two) times daily with a meal.) 30 tablet 0  . rosuvastatin (CRESTOR) 40 MG tablet Take 1 tablet (40 mg total) by mouth at bedtime. 90 tablet 1  . warfarin (COUMADIN) 2 MG tablet Take 1/2 tablet  daily except 1 tablet on Mondays, Wednesdays and Fridays or as directed 45 tablet 3   No current facility-administered medications for this visit.    Allergies No Known Allergies  Histories Past Medical History:  Diagnosis Date  . Ampullary adenoma   . Arthritis    Reported by patient  . Cancer [facility])    Reported by patient  . Claustrophobia   . Closed fracture of right distal tibia   . COPD (chronic obstructive pulmonary disease) ([place])    Reported  by patient  . Coronary artery disease    a. 1999 Inf MI;  b. 2003 CABGx6;  c. 2008 NSTEMI, occluded SVG to marginal and diagonal system. 2 DES placed to marginal system;  c. NSTEMI - 02/2012 Xience Xpedition DES to oS-OM and POBA to mid lesion;  c. 10/2013 NSTEMI/PCI: VG->OM 80 ISR, native OM 80/90 small, LIMA->LAD ok w/ 90 dLAD, D1 80, RIMA->RPL ok w/ 95 in RPL (2.5x14 Resolute DES), nl EF.  Marland Kitchen Dyspnea    Reported by patient  . GERD (gastroesophageal reflux disease)   . Hyperlipidemia   . Hypertension   . Myocardial infarction ([place])    X 6  . Pneumonia   . Presence of permanent cardiac pacemaker   . S/P AAA repair   . Tobacco abuse   . Wears dentures   . Wears glasses    Past Surgical History:  Procedure Laterality Date  . ABDOMINAL AORTIC ANEURYSM REPAIR  2006  . APPENDECTOMY    . CORONARY ANGIOPLASTY WITH STENT PLACEMENT    . CORONARY ARTERY BYPASS GRAFT  2003  . ENDOSCOPIC RETROGRADE CHOLANGIOPANCREATOGRAPHY (ERCP) WITH PROPOFOL N/A 03/25/2019   Procedure: ENDOSCOPIC RETROGRADE CHOLANGIOPANCREATOGRAPHY (ERCP) WITH PROPOFOL;  Surgeon: Rush Landmark Telford Nab., MD;  Location: [facility];  Service: Gastroenterology;  Laterality: N/A;  . ESOPHAGEAL DILATION  07/06/2018   Procedure: ESOPHAGEAL DILATION;  Surgeon: Rogene Houston, MD;  [facility];  Service: Endoscopy;;  . ESOPHAGOGASTRODUODENOSCOPY N/A 07/06/2018   Procedure: ESOPHAGOGASTRODUODENOSCOPY (EGD);  Surgeon: Rogene Houston, MD;  [facility];  Service: Endoscopy;  Laterality: N/A;  . ESOPHAGOGASTRODUODENOSCOPY N/A 08/16/2018   Procedure: ESOPHAGOGASTRODUODENOSCOPY (EGD);  Surgeon: Milus Banister, MD;  Location[facility];  Service: Endoscopy;  Laterality: N/A;  . ESOPHAGOGASTRODUODENOSCOPY (EGD) WITH PROPOFOL N/A 03/25/2019   Procedure: ESOPHAGOGASTRODUODENOSCOPY (EGD) WITH PROPOFOL;  Surgeon: Rush Landmark Telford Nab., MD;  Location: [place];  Service: Gastroenterology;  Laterality: N/A;  . EUS  N/A 08/16/2018   Procedure: UPPER ENDOSCOPIC ULTRASOUND (EUS) RADIAL;  Surgeon: Milus Banister, MD;  Location: [facility];  Service: Endoscopy;  Laterality: N/A;  . EUS N/A 03/25/2019   Procedure: UPPER ENDOSCOPIC ULTRASOUND (EUS) RADIAL;  Surgeon: Rush Landmark Telford Nab., MD;  Location: [facility];  Service: Gastroenterology;  Laterality: N/A;  . FOREIGN BODY REMOVAL  07/06/2018   Procedure: FOREIGN BODY REMOVAL;  Surgeon: Rogene Houston, MD;  [facility];  Service: Endoscopy;;  . HERNIA REPAIR     umbicical  . LEFT HEART CATHETERIZATION WITH CORONARY ANGIOGRAM N/A 03/05/2012   Procedure: LEFT HEART CATHETERIZATION WITH CORONARY ANGIOGRAM;  Surgeon: Hillary Bow, MD;  Location: [facility] CATH LAB;  Service: Cardiovascular;  Laterality: N/A;  . LEFT HEART CATHETERIZATION WITH CORONARY ANGIOGRAM N/A 10/23/2013   Procedure: LEFT HEART CATHETERIZATION WITH CORONARY ANGIOGRAM;  Surgeon: Blane Ohara, MD;  Location: [facility] CATH LAB;  Service: Cardiovascular;  Laterality: N/A;  . LEFT HEART CATHETERIZATION WITH CORONARY/GRAFT ANGIOGRAM  10/22/2013   Procedure: LEFT HEART  CATHETERIZATION WITH Beatrix Fetters;  Surgeon: Leonie Man, MD;  Location: [facility] CATH LAB;  Service: Cardiovascular;;  . PANCREATIC STENT PLACEMENT  03/25/2019   Procedure: PANCREATIC STENT PLACEMENT;  Surgeon: Rush Landmark Telford Nab., MD;  Location: [place];  Service: Gastroenterology;;  . PERCUTANEOUS CORONARY STENT INTERVENTION (PCI-S) N/A 03/07/2012   Procedure: PERCUTANEOUS CORONARY STENT INTERVENTION (PCI-S);  Surgeon: Sherren Mocha, MD;  Location: [facility] CATH LAB;  Service: Cardiovascular;  Laterality: N/A;  . PERCUTANEOUS CORONARY STENT INTERVENTION (PCI-S) Right 10/23/2013   Procedure: PERCUTANEOUS CORONARY STENT INTERVENTION (PCI-S);  Surgeon: Blane Ohara, MD;  Location: [facility] CATH LAB;  Service: Cardiovascular;  Laterality: Right;  . POLYPECTOMY  07/06/2018   Procedure: POLYPECTOMY;  Surgeon: Rogene Houston, MD;  [facility];  Service: Endoscopy;;  polyp at ampulla  . POLYPECTOMY  03/25/2019   Procedure: AMPULLECTOMY;  Surgeon: Mansouraty, Telford Nab., MD;  Location: [place];  Service: Gastroenterology;;  . Right hand surgery    . RIGHT HEART CATH AND CORONARY/GRAFT ANGIOGRAPHY N/A 12/16/2019   Procedure: RIGHT HEART CATH AND CORONARY/GRAFT ANGIOGRAPHY;  Surgeon: Nelva Bush, MD;  Location: [facility] CV LAB;  Service: Cardiovascular;  Laterality: N/A;  . SPHINCTEROTOMY  03/25/2019   Procedure: SPHINCTEROTOMY;  Surgeon: Rush Landmark Telford Nab., MD;  Location: [place];  Service: Gastroenterology;;  . TIBIA IM NAIL INSERTION Right 04/07/2016   Procedure: FIXATION OF RIGHT TIBIA FRACTURE;  Surgeon: Garald Balding, MD;  Location: [place];  Service: Orthopedics;  Laterality: Right;   Social History   Socioeconomic History  . Marital status: Legally Separated    Spouse name: Not on file  . Number of children: Not on file  . Years of education: Not on file  . Highest education level: Not on file  Occupational History  . Not on file  Tobacco Use  . Smoking status: Current Some Day Smoker    Packs/day: 1.00    Years: 54.00    Pack years: 54.00    Types: Cigarettes    Start date: 10/04/1964    Last attempt to quit: 05/30/2019    Years since quitting: 1.4  . Smokeless tobacco: Never Used  . Tobacco comment: since November 2020-has smoked a total of 6 packs  Vaping Use  . Vaping Use: Never used  Substance and Sexual Activity  . Alcohol use: Yes    Comment: Maybe 2 beers a month and 2 shotsa month  . Drug use: No  . Sexual activity: Yes  Other Topics Concern  . Not on file  Social History Narrative   Disabled.  Former truck Software engineer. Divorced and remarried x 2         Social Determinants of Radio broadcast assistant Strain: Not on file  Food Insecurity: Not on file  Transportation Needs: Not on file  Physical Activity: Not on  file  Stress: Not on file  Social Connections: Not on file  Intimate Partner Violence: Not on file   Family History  Problem Relation Age of Onset  . Coronary artery disease Other   . Colon cancer Neg Hx   . Esophageal cancer Neg Hx   . Inflammatory bowel disease Neg Hx   . Liver disease Neg Hx   . Pancreatic cancer Neg Hx   . Rectal cancer Neg Hx   . Stomach cancer Neg Hx    I have reviewed his medical, social, and family history in detail and updated the electronic medical record as necessary.  PHYSICAL EXAMINATION  BP (!) 162/92   Pulse 60   Ht 6' (1.829 m)   Wt 239 lb (108.4 kg)   BMI 32.41 kg/m  GEN: NAD, appears stated age, doesn't appear chronically ill, accompanied by family PSYCH: Cooperative, without pressured speech EYE: Conjunctivae pink, sclerae anicteric ENT: Masked CV: Nontachycardic RESP: No audible wheezing GI: NABS, soft, NT/ND, without rebound or guarding, no HSM appreciated MSK/EXT: Lower extremity edema present SKIN: No jaundice NEURO:  Alert & Oriented x 3, no focal deficits   REVIEW OF DATA  I reviewed the following data at the time of this encounter:  GI Procedures and Studies  October 2020 ERCP - The major papilla had an adenoma. - An area successfully injected. - A pancreatic sphincterotomy was performed. - A biliary sphincterotomy was performed. - Snare papillectomy of the major papilla was performed. Resection and retrieval were complete. - One temporary plastic pancreatic stent was placed into the ventral pancreatic duct to decrease post-ampullectomy pancreatitis.  October 2020 EUS EGD Impression: - No gross mucosal lesions in esophagus. Medium-sized hiatal hernia. - Non-bleeding erosive gastropathy. Biopsied. - No gross lesions in the duodenal bulb, in the first portion of the duodenum and in the second portion of the duodenum. - A single duodenal polyp. EUS Impression: - A lesion was found in the ampulla. A tissue diagnosis was  obtained prior to this exam.  This is consistent with an adenoma. - There was no sign of significant pathology in the common bile duct. - Hyperechoic material consistent with sludge was visualized endosonographically in the gallbladder body. - There was no sign of significant pathology in the pancreatic head, genu of the pancreas, pancreatic body and pancreatic tail. - No malignant-appearing lymph nodes were visualized in the celiac region (level 20), peripancreatic region and porta hepatis region.  Pathology DIAGNOSIS:  A. STOMACH, BIOPSY:  - Mild reactive gastropathy with erosion.  - Warthin-Starry is negative for Helicobacter pylori.  - No intestinal metaplasia, dysplasia, or malignancy.  B. DUODENUM, BIOPSY:  - Adenoma.  - No high grade dysplasia identified.   Laboratory Studies  Reviewed in epic  Imaging Studies  May 2022 KUB IMPRESSION: Nonobstructive bowel gas pattern. No biliary stent visualized.   ASSESSMENT  Mr. Teuscher is a 70 y.o. male with a pmh significant for CAD status post CABG, hypertension, hyperlipidemia, status post AAA repair, tobacco use, GERD, Schatzki ring, ampullary adenoma of the duodenum.  The patient is seen today for evaluation and management of:  1. Ampullary adenoma   2. History of ERCP    The patient is clinically and hemodynamically stable.  He is overdue for ampullary evaluation post endoscopic resection in 2020.  Pancreatic stent looks to have fallen out based on KUB that he was finally able to have performed.  It is important for [place] to evaluate the region and see if any recurrence has occurred.  If it has then further therapies including avulsion and APC may need to be considered.  Preprocedure labs and liver biochemical tests will be obtained to see if there are any abnormalities.  We will obtain approval for his Coumadin to be held for 5 days prior to procedure from his cardiology service/team.  The risks of an ERCP were discussed at length,  including but not limited to the risk of perforation, bleeding, abdominal pain, post-ERCP pancreatitis (while usually mild can be severe and even life threatening). The risks and benefits of endoscopic evaluation were discussed with the patient; these include but are not  limited to the risk of perforation, infection, bleeding, missed lesions, lack of diagnosis, severe illness requiring hospitalization, as well as anesthesia and sedation related illnesses.  The patient is agreeable to proceed.  All patient questions were answered to the best of my ability, and the patient agrees to the aforementioned plan of action with follow-up as indicated.   PLAN  Laboratories as outlined below Proceed with follow-up ERCP to evaluate ampullectomy site for any recurrence in the coming months Obtain approval for Coumadin hold per cardiology service   Orders Placed This Encounter  Procedures  . Procedural/ Surgical Case Request: ENDOSCOPIC RETROGRADE CHOLANGIOPANCREATOGRAPHY (ERCP) WITH PROPOFOL  . CBC  . Comp Met (CMET)  . INR/PT  . Ambulatory referral to Gastroenterology    New Prescriptions   No medications on file   Modified Medications   No medications on file    Planned Follow Up: No follow-ups on file.   Total Time in Face-to-Face and in Coordination of Care for patient including independent/personal interpretation/review of prior testing, medical history, examination, medication adjustment, communicating results with the patient directly, and documentation with the EHR is 25 minutes.   Justice Britain, MD [facility]oscopy Office # [phone]

## 2020-11-18 NOTE — Patient Instructions (Signed)
If you are age 68 or older, your body mass index should be between 23-30. Your Body mass index is 32.41 kg/m. If this is out of the aforementioned range listed, please consider follow up with your Primary Care Provider.  If you are age 76 or younger, your body mass index should be between 19-25. Your Body mass index is 32.41 kg/m. If this is out of the aformentioned range listed, please consider follow up with your Primary Care Provider.   __________________________________________________________  The [facility] providers would like to encourage you to use [facility] to communicate with providers for non-urgent requests or questions.  Due to long hold times on the telephone, sending your provider a message by [facility] may be a faster and more efficient way to get a response.  Please allow 48 business hours for a response.  Please remember that this is for non-urgent requests.   Your provider has requested that you go to the basement level for lab work before leaving today. Press "B" on the elevator. The lab is located at the first door on the left as you exit the elevator.  Due to recent changes in healthcare laws, you may see the results of your imaging and laboratory studies on MyChart before your provider has had a chance to review them.  We understand that in some cases there may be results that are confusing or concerning to you. Not all laboratory results come back in the same time frame and the provider may be waiting for multiple results in order to interpret others.  Please give [place] 48 hours in order for your provider to thoroughly review all the results before contacting the office for clarification of your results.   You have been scheduled for an endoscopy. Please follow written instructions given to you at your visit today. If you use inhalers (even only as needed), please bring them with you on the day of your procedure.   Thank you for choosing me and [facility].  Dr.  Rush Landmark

## 2020-11-19 ENCOUNTER — Telehealth: Payer: Self-pay

## 2020-11-19 NOTE — Telephone Encounter (Signed)
Request for surgical clearance:     Endoscopy Procedure  What type of surgery is being performed? ERCP   When is this surgery scheduled?     03/08/21  What type of clearance is required ?   Pharmacy  Are there any medications that need to be held prior to surgery and how long? Coumadin   Practice name and name of physician performing surgery?      [facility]-Dr. Rush Landmark   What is your office phone and fax number?      Phone- [phone]  [phone]  Anesthesia type (None, local, MAC, general) ?       MAC

## 2020-11-19 NOTE — Telephone Encounter (Signed)
Patient with diagnosis of LV mural thrombus on warfarin for anticoagulation.    Procedure: ERCP Date of procedure: 03/08/21  At time of procedure patient will have been on warfarin >1 year. Echo from 08/11/20 showed no evidence of thrombus  I will ask Dr. Harl Bowie to weigh in on if he needs a bridge for procedure.

## 2020-11-19 NOTE — Telephone Encounter (Signed)
See previous note

## 2020-11-19 NOTE — Telephone Encounter (Signed)
Does not require bridging   Zandra Abts MD

## 2020-11-20 ENCOUNTER — Encounter: Payer: Self-pay | Admitting: Gastroenterology

## 2020-11-20 LAB — COMPREHENSIVE METABOLIC PANEL
ALT: 13 U/L (ref 0–53)
AST: 15 U/L (ref 0–37)
Albumin: 4.7 g/dL (ref 3.5–5.2)
Alkaline Phosphatase: 64 U/L (ref 39–117)
BUN: 24 mg/dL — ABNORMAL HIGH (ref 6–23)
CO2: 23 mEq/L (ref 19–32)
Calcium: 9.7 mg/dL (ref 8.4–10.5)
Chloride: 99 mEq/L (ref 96–112)
Creatinine, Ser: 1.01 mg/dL (ref 0.40–1.50)
GFR: 76.62 mL/min (ref >60.00)
Glucose, Bld: 118 mg/dL — ABNORMAL HIGH (ref 70–99)
Potassium: 4.8 mEq/L (ref 3.5–5.1)
Sodium: 142 mEq/L (ref 135–145)
Total Bilirubin: 0.5 mg/dL (ref 0.2–1.2)
Total Protein: 8.1 g/dL (ref 6.0–8.3)

## 2020-11-20 NOTE — Telephone Encounter (Signed)
NP Gilford Rile, Thank you for this information. Rovonda and Chong Sicilian this is the anticoagulation notation for patient for his upcoming ERCP. Thanks. GM

## 2020-11-20 NOTE — Telephone Encounter (Signed)
Patient may hold warfarin for 5 days prior to procedure.

## 2020-11-20 NOTE — Telephone Encounter (Signed)
    Clifford Jones DOB:  12/05/1950  MRN:  [id number]   Primary Cardiologist: Carlyle Dolly, MD  Chart reviewed as part of pre-operative protocol coverage.Pharmacy clearance only. [name] may hold warfarin 5 days prior to planned procedure. No need for bridging per primary cardiologist.   I will route this recommendation to the requesting party via [place] fax function and remove from pre-op pool.  Please call with questions.  Loel Dubonnet, NP 11/20/2020, 10:53 AM

## 2020-11-23 NOTE — Telephone Encounter (Signed)
Left message for patient to return call for warfarin hold instructions prior to ERCP.  Will continue efforts.

## 2020-11-23 NOTE — Telephone Encounter (Signed)
Patient advised that he has been given clearance to hold Coumadin 5 days prior to ERCP scheduled for 02-18-2021.  Patient advised to take last dose of Coumadin on 03-02-2021, and he will be advised when to restart Coumadin by Dr Rush Landmark after the procedure.  Patient agreed to plan and verbalized understanding.  No further questions.

## 2020-11-24 DIAGNOSIS — F419 Anxiety disorder, unspecified: Secondary | ICD-10-CM | POA: Diagnosis not present

## 2020-11-24 DIAGNOSIS — Z955 Presence of coronary angioplasty implant and graft: Secondary | ICD-10-CM | POA: Diagnosis not present

## 2020-11-24 DIAGNOSIS — Z0001 Encounter for general adult medical examination with abnormal findings: Secondary | ICD-10-CM | POA: Diagnosis not present

## 2020-11-24 DIAGNOSIS — J449 Chronic obstructive pulmonary disease, unspecified: Secondary | ICD-10-CM | POA: Diagnosis not present

## 2020-11-24 DIAGNOSIS — F17210 Nicotine dependence, cigarettes, uncomplicated: Secondary | ICD-10-CM | POA: Diagnosis not present

## 2020-11-24 DIAGNOSIS — I10 Essential (primary) hypertension: Secondary | ICD-10-CM | POA: Diagnosis not present

## 2020-11-24 DIAGNOSIS — Z6833 Body mass index (BMI) 33.0-33.9, adult: Secondary | ICD-10-CM | POA: Diagnosis not present

## 2020-11-24 DIAGNOSIS — M19019 Primary osteoarthritis, unspecified shoulder: Secondary | ICD-10-CM | POA: Diagnosis not present

## 2020-12-01 DIAGNOSIS — E7800 Pure hypercholesterolemia, unspecified: Secondary | ICD-10-CM | POA: Diagnosis not present

## 2020-12-01 DIAGNOSIS — F17210 Nicotine dependence, cigarettes, uncomplicated: Secondary | ICD-10-CM | POA: Diagnosis not present

## 2020-12-01 DIAGNOSIS — Z955 Presence of coronary angioplasty implant and graft: Secondary | ICD-10-CM | POA: Diagnosis not present

## 2020-12-01 DIAGNOSIS — I10 Essential (primary) hypertension: Secondary | ICD-10-CM | POA: Diagnosis not present

## 2020-12-01 DIAGNOSIS — F419 Anxiety disorder, unspecified: Secondary | ICD-10-CM | POA: Diagnosis not present

## 2020-12-01 DIAGNOSIS — Z6832 Body mass index (BMI) 32.0-32.9, adult: Secondary | ICD-10-CM | POA: Diagnosis not present

## 2020-12-01 DIAGNOSIS — J449 Chronic obstructive pulmonary disease, unspecified: Secondary | ICD-10-CM | POA: Diagnosis not present

## 2020-12-01 DIAGNOSIS — K2100 Gastro-esophageal reflux disease with esophagitis, without bleeding: Secondary | ICD-10-CM | POA: Diagnosis not present

## 2020-12-09 ENCOUNTER — Ambulatory Visit (INDEPENDENT_AMBULATORY_CARE_PROVIDER_SITE_OTHER): Payer: Medicare HMO | Admitting: *Deleted

## 2020-12-09 ENCOUNTER — Other Ambulatory Visit: Payer: Self-pay

## 2020-12-09 DIAGNOSIS — Z5181 Encounter for therapeutic drug level monitoring: Secondary | ICD-10-CM | POA: Diagnosis not present

## 2020-12-09 DIAGNOSIS — I513 Intracardiac thrombosis, not elsewhere classified: Secondary | ICD-10-CM

## 2020-12-09 LAB — POCT INR: INR: 2.2 (ref 2.0–3.0)

## 2020-12-09 MED ORDER — WARFARIN SODIUM 2 MG PO TABS: ORAL_TABLET | ORAL | 2 refills | Status: DC

## 2020-12-09 NOTE — Patient Instructions (Signed)
Continue warfarin 1 tablet daily except 1/2 tablet on Tuesdays, Thursdays and Saturdays Recheck in 6 wks 

## 2020-12-17 DIAGNOSIS — J441 Chronic obstructive pulmonary disease with (acute) exacerbation: Secondary | ICD-10-CM | POA: Diagnosis not present

## 2020-12-17 DIAGNOSIS — Z720 Tobacco use: Secondary | ICD-10-CM | POA: Diagnosis not present

## 2020-12-17 DIAGNOSIS — I10 Essential (primary) hypertension: Secondary | ICD-10-CM | POA: Diagnosis not present

## 2021-01-04 DIAGNOSIS — H353123 Nonexudative age-related macular degeneration, left eye, advanced atrophic without subfoveal involvement: Secondary | ICD-10-CM | POA: Diagnosis not present

## 2021-01-04 DIAGNOSIS — Z0100 Encounter for examination of eyes and vision without abnormal findings: Secondary | ICD-10-CM | POA: Diagnosis not present

## 2021-01-04 DIAGNOSIS — H353111 Nonexudative age-related macular degeneration, right eye, early dry stage: Secondary | ICD-10-CM | POA: Diagnosis not present

## 2021-01-04 DIAGNOSIS — H524 Presbyopia: Secondary | ICD-10-CM | POA: Diagnosis not present

## 2021-01-04 DIAGNOSIS — H2513 Age-related nuclear cataract, bilateral: Secondary | ICD-10-CM | POA: Diagnosis not present

## 2021-01-04 DIAGNOSIS — H52223 Regular astigmatism, bilateral: Secondary | ICD-10-CM | POA: Diagnosis not present

## 2021-01-07 DIAGNOSIS — Z6833 Body mass index (BMI) 33.0-33.9, adult: Secondary | ICD-10-CM | POA: Diagnosis not present

## 2021-01-07 DIAGNOSIS — Z955 Presence of coronary angioplasty implant and graft: Secondary | ICD-10-CM | POA: Diagnosis not present

## 2021-01-07 DIAGNOSIS — I10 Essential (primary) hypertension: Secondary | ICD-10-CM | POA: Diagnosis not present

## 2021-01-07 DIAGNOSIS — J449 Chronic obstructive pulmonary disease, unspecified: Secondary | ICD-10-CM | POA: Diagnosis not present

## 2021-01-17 DIAGNOSIS — Z720 Tobacco use: Secondary | ICD-10-CM | POA: Diagnosis not present

## 2021-01-17 DIAGNOSIS — J441 Chronic obstructive pulmonary disease with (acute) exacerbation: Secondary | ICD-10-CM | POA: Diagnosis not present

## 2021-01-17 DIAGNOSIS — I10 Essential (primary) hypertension: Secondary | ICD-10-CM | POA: Diagnosis not present

## 2021-01-20 ENCOUNTER — Other Ambulatory Visit: Payer: Self-pay

## 2021-01-20 ENCOUNTER — Emergency Department (HOSPITAL_COMMUNITY): Payer: Medicare HMO

## 2021-01-20 ENCOUNTER — Inpatient Hospital Stay (HOSPITAL_COMMUNITY)
Admission: EM | Admit: 2021-01-20 | Discharge: 2021-01-24 | DRG: 177 | Disposition: A | Discharge status: Home / Self Care | Payer: Medicare HMO | Attending: Internal Medicine | Admitting: Internal Medicine

## 2021-01-20 ENCOUNTER — Encounter (HOSPITAL_COMMUNITY): Payer: Self-pay

## 2021-01-20 DIAGNOSIS — I2510 Atherosclerotic heart disease of native coronary artery without angina pectoris: Secondary | ICD-10-CM | POA: Diagnosis present

## 2021-01-20 DIAGNOSIS — J9601 Acute respiratory failure with hypoxia: Secondary | ICD-10-CM | POA: Diagnosis present

## 2021-01-20 DIAGNOSIS — Z8679 Personal history of other diseases of the circulatory system: Secondary | ICD-10-CM | POA: Diagnosis not present

## 2021-01-20 DIAGNOSIS — Z955 Presence of coronary angioplasty implant and graft: Secondary | ICD-10-CM

## 2021-01-20 DIAGNOSIS — Z720 Tobacco use: Secondary | ICD-10-CM | POA: Diagnosis not present

## 2021-01-20 DIAGNOSIS — I714 Abdominal aortic aneurysm, without rupture: Secondary | ICD-10-CM | POA: Diagnosis present

## 2021-01-20 DIAGNOSIS — J441 Chronic obstructive pulmonary disease with (acute) exacerbation: Secondary | ICD-10-CM | POA: Diagnosis present

## 2021-01-20 DIAGNOSIS — Z79899 Other long term (current) drug therapy: Secondary | ICD-10-CM | POA: Diagnosis not present

## 2021-01-20 DIAGNOSIS — I252 Old myocardial infarction: Secondary | ICD-10-CM

## 2021-01-20 DIAGNOSIS — J440 Chronic obstructive pulmonary disease with acute lower respiratory infection: Secondary | ICD-10-CM | POA: Diagnosis present

## 2021-01-20 DIAGNOSIS — E782 Mixed hyperlipidemia: Secondary | ICD-10-CM | POA: Diagnosis not present

## 2021-01-20 DIAGNOSIS — E669 Obesity, unspecified: Secondary | ICD-10-CM | POA: Diagnosis present

## 2021-01-20 DIAGNOSIS — K219 Gastro-esophageal reflux disease without esophagitis: Secondary | ICD-10-CM | POA: Diagnosis present

## 2021-01-20 DIAGNOSIS — Z9981 Dependence on supplemental oxygen: Secondary | ICD-10-CM | POA: Diagnosis not present

## 2021-01-20 DIAGNOSIS — F17210 Nicotine dependence, cigarettes, uncomplicated: Secondary | ICD-10-CM | POA: Diagnosis present

## 2021-01-20 DIAGNOSIS — I25810 Atherosclerosis of coronary artery bypass graft(s) without angina pectoris: Secondary | ICD-10-CM | POA: Diagnosis present

## 2021-01-20 DIAGNOSIS — U071 COVID-19: Principal | ICD-10-CM | POA: Diagnosis present

## 2021-01-20 DIAGNOSIS — Z8249 Family history of ischemic heart disease and other diseases of the circulatory system: Secondary | ICD-10-CM | POA: Diagnosis not present

## 2021-01-20 DIAGNOSIS — Z8701 Personal history of pneumonia (recurrent): Secondary | ICD-10-CM

## 2021-01-20 DIAGNOSIS — E1151 Type 2 diabetes mellitus with diabetic peripheral angiopathy without gangrene: Secondary | ICD-10-CM | POA: Diagnosis present

## 2021-01-20 DIAGNOSIS — J1282 Pneumonia due to coronavirus disease 2019: Secondary | ICD-10-CM | POA: Diagnosis present

## 2021-01-20 DIAGNOSIS — E876 Hypokalemia: Secondary | ICD-10-CM | POA: Diagnosis present

## 2021-01-20 DIAGNOSIS — Z950 Presence of cardiac pacemaker: Secondary | ICD-10-CM

## 2021-01-20 DIAGNOSIS — R9431 Abnormal electrocardiogram [ECG] [EKG]: Secondary | ICD-10-CM | POA: Diagnosis present

## 2021-01-20 DIAGNOSIS — Z6831 Body mass index (BMI) 31.0-31.9, adult: Secondary | ICD-10-CM

## 2021-01-20 DIAGNOSIS — J9621 Acute and chronic respiratory failure with hypoxia: Secondary | ICD-10-CM | POA: Diagnosis present

## 2021-01-20 DIAGNOSIS — I739 Peripheral vascular disease, unspecified: Secondary | ICD-10-CM | POA: Diagnosis present

## 2021-01-20 DIAGNOSIS — E119 Type 2 diabetes mellitus without complications: Secondary | ICD-10-CM | POA: Diagnosis not present

## 2021-01-20 DIAGNOSIS — I10 Essential (primary) hypertension: Secondary | ICD-10-CM | POA: Diagnosis present

## 2021-01-20 DIAGNOSIS — E785 Hyperlipidemia, unspecified: Secondary | ICD-10-CM | POA: Diagnosis present

## 2021-01-20 DIAGNOSIS — M1990 Unspecified osteoarthritis, unspecified site: Secondary | ICD-10-CM | POA: Diagnosis present

## 2021-01-20 DIAGNOSIS — I517 Cardiomegaly: Secondary | ICD-10-CM | POA: Diagnosis not present

## 2021-01-20 DIAGNOSIS — I2583 Coronary atherosclerosis due to lipid rich plaque: Secondary | ICD-10-CM | POA: Diagnosis not present

## 2021-01-20 DIAGNOSIS — J9622 Acute and chronic respiratory failure with hypercapnia: Secondary | ICD-10-CM | POA: Diagnosis present

## 2021-01-20 DIAGNOSIS — R0602 Shortness of breath: Secondary | ICD-10-CM | POA: Diagnosis not present

## 2021-01-20 HISTORY — DX: Type 2 diabetes mellitus without complications: E11.9

## 2021-01-20 LAB — BLOOD GAS, VENOUS
Acid-Base Excess: 13.6 mmol/L — ABNORMAL HIGH (ref 0.0–2.0)
Bicarbonate: 33.4 mmol/L — ABNORMAL HIGH (ref 20.0–28.0)
FIO2: 28
O2 Saturation: 67.4 %
Patient temperature: 36.6
pCO2, Ven: 83.8 mmHg (ref 44.0–60.0)
pH, Ven: 7.303 (ref 7.250–7.430)
pO2, Ven: 40.8 mmHg (ref 32.0–45.0)

## 2021-01-20 LAB — CBC WITH DIFFERENTIAL/PLATELET
Abs Immature Granulocytes: 0.05 10*3/uL (ref 0.00–0.07)
Basophils Absolute: 0 10*3/uL (ref 0.0–0.1)
Basophils Relative: 0 %
Eosinophils Absolute: 0 10*3/uL (ref 0.0–0.5)
Eosinophils Relative: 1 %
HCT: 43 % (ref 39.0–52.0)
Hemoglobin: 13.4 g/dL (ref 13.0–17.0)
Immature Granulocytes: 1 %
Lymphocytes Relative: 25 %
Lymphs Abs: 1.8 10*3/uL (ref 0.7–4.0)
MCH: 30.7 pg (ref 26.0–34.0)
MCHC: 31.2 g/dL (ref 30.0–36.0)
MCV: 98.6 fL (ref 80.0–100.0)
Monocytes Absolute: 0.7 10*3/uL (ref 0.1–1.0)
Monocytes Relative: 10 %
Neutro Abs: 4.4 10*3/uL (ref 1.7–7.7)
Neutrophils Relative %: 63 %
Platelets: 230 10*3/uL (ref 150–400)
RBC: 4.36 MIL/uL (ref 4.22–5.81)
RDW: 12.4 % (ref 11.5–15.5)
WBC: 7 10*3/uL (ref 4.0–10.5)
nRBC: 0 % (ref 0.0–0.2)

## 2021-01-20 LAB — COMPREHENSIVE METABOLIC PANEL
ALT: 14 U/L (ref 0–44)
AST: 16 U/L (ref 15–41)
Albumin: 3 g/dL — ABNORMAL LOW (ref 3.5–5.0)
Alkaline Phosphatase: 55 U/L (ref 38–126)
Anion gap: 11 (ref 5–15)
BUN: 18 mg/dL (ref 8–23)
CO2: 36 mmol/L — ABNORMAL HIGH (ref 22–32)
Calcium: 8.5 mg/dL — ABNORMAL LOW (ref 8.9–10.3)
Chloride: 91 mmol/L — ABNORMAL LOW (ref 98–111)
Creatinine, Ser: 0.9 mg/dL (ref 0.61–1.24)
GFR, Estimated: >60 mL/min (ref >60)
Glucose, Bld: 123 mg/dL — ABNORMAL HIGH (ref 70–99)
Potassium: 3.4 mmol/L — ABNORMAL LOW (ref 3.5–5.1)
Sodium: 138 mmol/L (ref 135–145)
Total Bilirubin: 1 mg/dL (ref 0.3–1.2)
Total Protein: 6.8 g/dL (ref 6.5–8.1)

## 2021-01-20 LAB — PROCALCITONIN: Procalcitonin: <0.1 ng/mL

## 2021-01-20 LAB — PROTIME-INR
INR: 2.9 — ABNORMAL HIGH (ref 0.8–1.2)
Prothrombin Time: 29.9 seconds — ABNORMAL HIGH (ref 11.4–15.2)

## 2021-01-20 LAB — C-REACTIVE PROTEIN: CRP: 15.4 mg/dL — ABNORMAL HIGH (ref <1.0)

## 2021-01-20 LAB — RESP PANEL BY RT-PCR (FLU A&B, COVID) ARPGX2
Influenza A by PCR: NEGATIVE
Influenza B by PCR: NEGATIVE
SARS Coronavirus 2 by RT PCR: POSITIVE — AB

## 2021-01-20 LAB — FIBRINOGEN: Fibrinogen: 576 mg/dL — ABNORMAL HIGH (ref 210–475)

## 2021-01-20 LAB — D-DIMER, QUANTITATIVE: D-Dimer, Quant: 1.12 ug/mL-FEU — ABNORMAL HIGH (ref 0.00–0.50)

## 2021-01-20 LAB — PHOSPHORUS: Phosphorus: 3.7 mg/dL (ref 2.5–4.6)

## 2021-01-20 LAB — BRAIN NATRIURETIC PEPTIDE: B Natriuretic Peptide: 280 pg/mL — ABNORMAL HIGH (ref 0.0–100.0)

## 2021-01-20 LAB — TROPONIN I (HIGH SENSITIVITY)
Troponin I (High Sensitivity): 17 ng/L (ref <18)
Troponin I (High Sensitivity): 16 ng/L (ref <18)

## 2021-01-20 LAB — FERRITIN: Ferritin: 165 ng/mL (ref 24–336)

## 2021-01-20 LAB — MAGNESIUM: Magnesium: 1.8 mg/dL (ref 1.7–2.4)

## 2021-01-20 MED ORDER — IPRATROPIUM-ALBUTEROL 20-100 MCG/ACT IN AERS
2.0000 | INHALATION_SPRAY | Freq: Four times a day (QID) | RESPIRATORY_TRACT | Status: DC
  Administered 2021-01-21 – 2021-01-22 (×5): 2 via RESPIRATORY_TRACT

## 2021-01-20 MED ORDER — LISINOPRIL 5 MG PO TABS
2.5000 mg | ORAL_TABLET | Freq: Every day | ORAL | Status: DC
  Administered 2021-01-20 – 2021-01-24 (×5): 2.5 mg via ORAL
  Filled 2021-01-20 (×5): qty 1

## 2021-01-20 MED ORDER — GUAIFENESIN-DM 100-10 MG/5ML PO SYRP: 10.0000 mL | ORAL_SOLUTION | ORAL | Status: DC | PRN

## 2021-01-20 MED ORDER — ISOSORBIDE MONONITRATE ER 60 MG PO TB24
30.0000 mg | ORAL_TABLET | Freq: Every day | ORAL | Status: DC
  Administered 2021-01-20 – 2021-01-24 (×5): 30 mg via ORAL
  Filled 2021-01-20 (×4): qty 1

## 2021-01-20 MED ORDER — CARVEDILOL 3.125 MG PO TABS
3.1250 mg | ORAL_TABLET | Freq: Two times a day (BID) | ORAL | Status: DC
  Administered 2021-01-21 – 2021-01-24 (×7): 3.125 mg via ORAL
  Filled 2021-01-20 (×7): qty 1

## 2021-01-20 MED ORDER — ENOXAPARIN SODIUM 60 MG/0.6ML IJ SOSY: 60.0000 mg | PREFILLED_SYRINGE | INTRAMUSCULAR | Status: DC

## 2021-01-20 MED ORDER — VITAMIN B-12 1000 MCG PO TABS
1000.0000 ug | ORAL_TABLET | Freq: Every day | ORAL | Status: DC
  Administered 2021-01-20 – 2021-01-24 (×5): 1000 ug via ORAL
  Filled 2021-01-20 (×5): qty 1

## 2021-01-20 MED ORDER — ACETAMINOPHEN 325 MG PO TABS: 650.0000 mg | ORAL_TABLET | Freq: Four times a day (QID) | ORAL | Status: DC | PRN

## 2021-01-20 MED ORDER — RANOLAZINE ER 500 MG PO TB12
1000.0000 mg | ORAL_TABLET | Freq: Two times a day (BID) | ORAL | Status: DC
  Administered 2021-01-21 – 2021-01-24 (×7): 1000 mg via ORAL
  Filled 2021-01-20 (×7): qty 2

## 2021-01-20 MED ORDER — METHYLPREDNISOLONE SODIUM SUCC 125 MG IJ SOLR
125.0000 mg | Freq: Once | INTRAMUSCULAR | Status: AC
  Administered 2021-01-20: 125 mg via INTRAVENOUS
  Filled 2021-01-20: qty 2

## 2021-01-20 MED ORDER — ALBUTEROL SULFATE HFA 108 (90 BASE) MCG/ACT IN AERS: 2.0000 | INHALATION_SPRAY | RESPIRATORY_TRACT | Status: DC | PRN

## 2021-01-20 MED ORDER — AMLODIPINE BESYLATE 5 MG PO TABS
5.0000 mg | ORAL_TABLET | Freq: Every day | ORAL | Status: DC
  Administered 2021-01-21 – 2021-01-24 (×4): 5 mg via ORAL
  Filled 2021-01-20 (×4): qty 1

## 2021-01-20 MED ORDER — ACETAMINOPHEN 650 MG RE SUPP: 650.0000 mg | Freq: Four times a day (QID) | RECTAL | Status: DC | PRN

## 2021-01-20 MED ORDER — ASPIRIN EC 81 MG PO TBEC
81.0000 mg | DELAYED_RELEASE_TABLET | Freq: Every day | ORAL | Status: DC
  Administered 2021-01-21 – 2021-01-24 (×4): 81 mg via ORAL
  Filled 2021-01-20 (×5): qty 1

## 2021-01-20 MED ORDER — MAGNESIUM SULFATE 2 GM/50ML IV SOLN
2.0000 g | Freq: Once | INTRAVENOUS | Status: AC
  Administered 2021-01-20: 2 g via INTRAVENOUS
  Filled 2021-01-20: qty 50

## 2021-01-20 MED ORDER — ROSUVASTATIN CALCIUM 20 MG PO TABS
40.0000 mg | ORAL_TABLET | Freq: Every day | ORAL | Status: DC
  Administered 2021-01-20 – 2021-01-23 (×4): 40 mg via ORAL
  Filled 2021-01-20 (×4): qty 2

## 2021-01-20 MED ORDER — ALBUTEROL (5 MG/ML) CONTINUOUS INHALATION SOLN
5.0000 mg/h | INHALATION_SOLUTION | Freq: Once | RESPIRATORY_TRACT | Status: AC
  Administered 2021-01-20: 5 mg/h via RESPIRATORY_TRACT
  Filled 2021-01-20: qty 20

## 2021-01-20 MED ORDER — HYDROCOD POLST-CPM POLST ER 10-8 MG/5ML PO SUER: 5.0000 mL | Freq: Two times a day (BID) | ORAL | Status: DC | PRN

## 2021-01-20 MED ORDER — FUROSEMIDE 20 MG PO TABS
20.0000 mg | ORAL_TABLET | Freq: Every day | ORAL | Status: DC
  Administered 2021-01-20 – 2021-01-24 (×5): 20 mg via ORAL
  Filled 2021-01-20 (×5): qty 1

## 2021-01-20 MED ORDER — PANTOPRAZOLE SODIUM 40 MG PO TBEC
40.0000 mg | DELAYED_RELEASE_TABLET | Freq: Two times a day (BID) | ORAL | Status: DC
  Administered 2021-01-20 – 2021-01-24 (×8): 40 mg via ORAL
  Filled 2021-01-20 (×8): qty 1

## 2021-01-20 MED ORDER — SODIUM CHLORIDE 0.9 % IV SOLN: 100.0000 mg | Freq: Every day | INTRAVENOUS | Status: DC

## 2021-01-20 MED ORDER — NITROGLYCERIN 0.4 MG SL SUBL: 0.4000 mg | SUBLINGUAL_TABLET | SUBLINGUAL | Status: DC | PRN

## 2021-01-20 MED ORDER — DEXAMETHASONE SODIUM PHOSPHATE 10 MG/ML IJ SOLN
6.0000 mg | INTRAMUSCULAR | Status: DC
  Administered 2021-01-21: 6 mg via INTRAVENOUS
  Filled 2021-01-20: qty 1

## 2021-01-20 MED ORDER — REMDESIVIR 100 MG IV SOLR: 100.0000 mg | INTRAVENOUS | Status: DC

## 2021-01-20 MED ORDER — IPRATROPIUM-ALBUTEROL 20-100 MCG/ACT IN AERS
2.0000 | INHALATION_SPRAY | Freq: Four times a day (QID) | RESPIRATORY_TRACT | Status: DC
  Administered 2021-01-20 (×2): 2 via RESPIRATORY_TRACT
  Filled 2021-01-20: qty 4

## 2021-01-20 MED ORDER — POTASSIUM CHLORIDE CRYS ER 20 MEQ PO TBCR
40.0000 meq | EXTENDED_RELEASE_TABLET | ORAL | Status: AC
Start: 2021-01-20 — End: 2021-01-21
  Administered 2021-01-20 – 2021-01-21 (×2): 40 meq via ORAL
  Filled 2021-01-20 (×2): qty 2

## 2021-01-20 MED ORDER — CEPHALEXIN 500 MG PO CAPS
1000.0000 mg | ORAL_CAPSULE | Freq: Two times a day (BID) | ORAL | Status: DC
  Administered 2021-01-20: 1000 mg via ORAL
  Filled 2021-01-20 (×2): qty 2

## 2021-01-20 NOTE — Progress Notes (Signed)
ANTICOAGULATION CONSULT NOTE - Initial Consult  Pharmacy Consult for coumadin Indication:  mural thrombus  No Known Allergies  Patient Measurements: Height: 6' (182.9 cm) Weight: 111.1 kg (245 lb) IBW/kg (Calculated) : 77.6   Vital Signs: BP: 135/80 (08/03 2030) Pulse Rate: 63 (08/03 2030)  Labs: Recent Labs    01/20/21 1117 01/20/21 1312  HGB 13.4  --   HCT 43.0  --   PLT 230  --   CREATININE 0.90  --   TROPONINIHS 17 16    Estimated Creatinine Clearance: 101.1 mL/min (by C-G formula based on SCr of 0.9 mg/dL).   Medical History: Past Medical History:  Diagnosis Date   Ampullary adenoma    Arthritis    Reported by patient   Cancer [facility])    Reported by patient   Claustrophobia    Closed fracture of right distal tibia    COPD (chronic obstructive pulmonary disease) ([place])    Reported by patient   Coronary artery disease    a. 1999 Inf MI;  b. 2003 CABGx6;  c. 2008 NSTEMI, occluded SVG to marginal and diagonal system. 2 DES placed to marginal system;  c. NSTEMI - 02/2012 Xience Xpedition DES to oS-OM and POBA to mid lesion;  c. 10/2013 NSTEMI/PCI: VG->OM 80 ISR, native OM 80/90 small, LIMA->LAD ok w/ 90 dLAD, D1 80, RIMA->RPL ok w/ 95 in RPL (2.5x14 Resolute DES), nl EF.   Dyspnea    Reported by patient   GERD (gastroesophageal reflux disease)    Hyperlipidemia    Hypertension    Myocardial infarction ([facility])    X 6   Pneumonia    Presence of permanent cardiac pacemaker    S/P AAA repair    Tobacco abuse    Wears dentures    Wears glasses     Medications:  See medication history  Assessment: 70 yo man to continue coumadin from home.  INR pending.  He has taken his dose for today.  Hg 13.4 Goal of Therapy:  INR 2-3 Monitor platelets by anticoagulation protocol: Yes   Plan:  Daily PT/INR F/u INR tonight.  Excell Seltzer Poteet 01/20/2021,10:13 PM

## 2021-01-20 NOTE — ED Notes (Signed)
Pt ambulated in room on 2.5L via [place] O2. He dropped down to 87%. Recovered back to 92% on same after approx 5 min

## 2021-01-20 NOTE — ED Triage Notes (Signed)
Pt reports cough and congestion x 1 week.  Reports did a home covid test last week and was negative.  Denies fever.  Reports sob.  Pt on home o2 at 2.5 liters.

## 2021-01-20 NOTE — H&P (Signed)
History and Physical    CHAOS POKE [id number] DOB: [birth year] DOA: 01/20/2021  PCP: Lavella Lemons, PA  Patient coming from: Home.  I have personally briefly reviewed patient's old medical records in [place]  Chief Complaint: Shortness of breath.  HPI: Clifford Jones is a 70 y.o. male with medical history significant of ampullary adenoma, osteoarthritis, claustrophobia, closed fracture of distal fibula, COPD, CAD/CABG, history of MI x6, pacemaker placement, AAA repair, GERD, hyperlipidemia, hypertension, history of pneumonia, class I obesity who is coming to the emergency department with complaints of cough, congestion, fatigue, malaise and progressively worse dyspnea with wheezing since about a week ago.  He has been using his home oxygen at 2.5 L/min at home, which he stated he normally uses as needed once or twice a month.  He denies sick contacts or travel history.  He was vaccinated, but not posted.  No fever, chills, rhinorrhea or hemoptysis.  No chest pain, palpitations, diaphoresis, PND, orthopnea, but stated he occasionally gets pitting edema of the lower extremities.  Denied abdominal pain, nausea, vomiting, diarrhea, constipation, melena or hematochezia.  No dysuria, frequency or hematuria.  No polyuria, polydipsia, polyphagia or blurred vision.  ED Course: Initial vital signs were temperature 97.8 F, pulse 53, respiration 20, BP 139/79 mmHg and O2 sat93% on 2.5 L via nasal cannula.  The patient was given Solu-Medrol 125 mg IVP x1 and remdesivir per pharmacy consult was placed.  Lab work: CBC showed a white count of 7.0 with a normal differential, hemoglobin 13.4 g/dL platelets 230.   Fibrinogen and PT/INR pending.Troponin x2 normal.  BNP 280.0 pg/mL.  VBG showed a pH of 7.303 with a PCO2 of 83.8  and PO2 of 40 point a mmHg, bicarbonate was 33.4 and acid base excess 13.6 mmol/L.  His coronavirus 2 PCR was positive. Procalcitonin still pending. CRP was 15.4,phosphorus 3.7  and magnesium 1.9 mg/dL. Ferritin 165 ng/mL.  Imaging: 1 chest portable radiograph show hazy opacity at the bases which could be infection or atelectasis with small pleural effusion.  Please see image and full radiology report for further detail.  Review of Systems: As per HPI otherwise all other systems reviewed and are negative.  Past Medical History:  Diagnosis Date   Ampullary adenoma    Arthritis    Reported by patient   Cancer [facility])    Reported by patient   Claustrophobia    Closed fracture of right distal tibia    COPD (chronic obstructive pulmonary disease) ([place])    Reported by patient   Coronary artery disease    a. 1999 Inf MI;  b. 2003 CABGx6;  c. 2008 NSTEMI, occluded SVG to marginal and diagonal system. 2 DES placed to marginal system;  c. NSTEMI - 02/2012 Xience Xpedition DES to oS-OM and POBA to mid lesion;  c. 10/2013 NSTEMI/PCI: VG->OM 80 ISR, native OM 80/90 small, LIMA->LAD ok w/ 90 dLAD, D1 80, RIMA->RPL ok w/ 95 in RPL (2.5x14 Resolute DES), nl EF.   Dyspnea    Reported by patient   GERD (gastroesophageal reflux disease)    Hyperlipidemia    Hypertension    Myocardial infarction ([place])    X 6   Pneumonia    Presence of permanent cardiac pacemaker    S/P AAA repair    Tobacco abuse    Wears dentures    Wears glasses    Past Surgical History:  Procedure Laterality Date   ABDOMINAL AORTIC ANEURYSM REPAIR  2006   APPENDECTOMY  CORONARY ANGIOPLASTY WITH STENT PLACEMENT     CORONARY ARTERY BYPASS GRAFT  2003   ENDOSCOPIC RETROGRADE CHOLANGIOPANCREATOGRAPHY (ERCP) WITH PROPOFOL N/A 03/25/2019   Procedure: ENDOSCOPIC RETROGRADE CHOLANGIOPANCREATOGRAPHY (ERCP) WITH PROPOFOL;  Surgeon: Irving Copas., MD;  Location: [place];  Service: Gastroenterology;  Laterality: N/A;   ESOPHAGEAL DILATION  07/06/2018   Procedure: ESOPHAGEAL DILATION;  Surgeon: Rogene Houston, MD;  [facility];  Service: Endoscopy;;   ESOPHAGOGASTRODUODENOSCOPY N/A  07/06/2018   Procedure: ESOPHAGOGASTRODUODENOSCOPY (EGD);  Surgeon: Rogene Houston, MD;  [facility];  Service: Endoscopy;  Laterality: N/A;   ESOPHAGOGASTRODUODENOSCOPY N/A 08/16/2018   Procedure: ESOPHAGOGASTRODUODENOSCOPY (EGD);  Surgeon: Milus Banister, MD;  Location[facility];  Service: Endoscopy;  Laterality: N/A;   ESOPHAGOGASTRODUODENOSCOPY (EGD) WITH PROPOFOL N/A 03/25/2019   Procedure: ESOPHAGOGASTRODUODENOSCOPY (EGD) WITH PROPOFOL;  Surgeon: Rush Landmark Telford Nab., MD;  Location: [place];  Service: Gastroenterology;  Laterality: N/A;   EUS N/A 08/16/2018   Procedure: UPPER ENDOSCOPIC ULTRASOUND (EUS) RADIAL;  Surgeon: Milus Banister, MD;  [facility]: [facility];  Service: Endoscopy;  Laterality: N/A;   EUS N/A 03/25/2019   Procedure: UPPER ENDOSCOPIC ULTRASOUND (EUS) RADIAL;  Surgeon: Irving Copas., MD;  Location: [place];  Service: Gastroenterology;  Laterality: N/A;   FOREIGN BODY REMOVAL  07/06/2018   Procedure: FOREIGN BODY REMOVAL;  Surgeon: Rogene Houston, MD;  [facility];  Service: Endoscopy;;   HERNIA REPAIR     umbicical   LEFT HEART CATHETERIZATION WITH CORONARY ANGIOGRAM N/A 03/05/2012   Procedure: LEFT HEART CATHETERIZATION WITH CORONARY ANGIOGRAM;  Surgeon: Hillary Bow, MD;  [facility] C[facility] LAB;  Service: Cardiovascular;  Laterality: N/A;   LEFT HEART CATHETERIZATION WITH CORONARY ANGIOGRAM N/A 10/23/2013   Procedure: LEFT HEART CATHETERIZATION WITH CORONARY ANGIOGRAM;  Surgeon: Blane Ohara, MD;  Location: [facility] CATH LAB;  Service: Cardiovascular;  Laterality: N/A;   LEFT HEART CATHETERIZATION WITH CORONARY/GRAFT ANGIOGRAM  10/22/2013   Procedure: LEFT HEART CATHETERIZATION WITH Beatrix Fetters;  Surgeon: Leonie Man, MD;  Location: [facility] CATH LAB;  Service: Cardiovascular;;   PANCREATIC STENT PLACEMENT  03/25/2019   Procedure: PANCREATIC STENT PLACEMENT;  Surgeon: Rush Landmark Telford Nab., MD;  Location: [place];  Service: Gastroenterology;;   PERCUTANEOUS CORONARY STENT INTERVENTION (PCI-S) N/A 03/07/2012   Procedure: PERCUTANEOUS CORONARY STENT INTERVENTION (PCI-S);  Surgeon: Sherren Mocha, MD;  Location: [facility] CATH LAB;  Service: Cardiovascular;  Laterality: N/A;   PERCUTANEOUS CORONARY STENT INTERVENTION (PCI-S) Right 10/23/2013   Procedure: PERCUTANEOUS CORONARY STENT INTERVENTION (PCI-S);  Surgeon: Blane Ohara, MD;  Location: [facility] CATH LAB;  Service: Cardiovascular;  Laterality: Right;   POLYPECTOMY  07/06/2018   Procedure: POLYPECTOMY;  Surgeon: Rogene Houston, MD;  [facility];  Service: Endoscopy;;  polyp at ampulla   POLYPECTOMY  03/25/2019   Procedure: AMPULLECTOMY;  Surgeon: Rush Landmark Telford Nab., MD;  Location: [place];  Service: Gastroenterology;;   Right hand surgery     RIGHT HEART CATH AND CORONARY/GRAFT ANGIOGRAPHY N/A 12/16/2019   Procedure: RIGHT HEART CATH AND CORONARY/GRAFT ANGIOGRAPHY;  Surgeon: Nelva Bush, MD;  Location: [place] CV LAB;  Service: Cardiovascular;  Laterality: N/A;   SPHINCTEROTOMY  03/25/2019   Procedure: SPHINCTEROTOMY;  Surgeon: Mansouraty, Telford Nab., MD;  Location: [place];  Service: Gastroenterology;;   TIBIA IM NAIL INSERTION Right 04/07/2016   Procedure: FIXATION OF RIGHT TIBIA FRACTURE;  Surgeon: Garald Balding, MD;  Location: [place];  Service: Orthopedics;  Laterality: Right;  Social History  reports that he has been smoking cigarettes. He started smoking about 56 years ago. He has a 54.00 pack-year smoking history. He has never used smokeless tobacco. He reports current alcohol use. He reports that he does not use drugs.  No Known Allergies  Family History  Problem Relation Age of Onset   Coronary artery disease Other    Colon cancer Neg Hx    Esophageal cancer Neg Hx    Inflammatory bowel disease Neg Hx    Liver disease Neg Hx    Pancreatic cancer Neg Hx    Rectal cancer Neg Hx    Stomach cancer Neg  Hx    Prior to Admission medications   Medication Sig Start Date End Date Taking? Authorizing Provider  acetaminophen (TYLENOL) 500 MG tablet Take 1,000 mg by mouth every 6 (six) hours as needed for mild pain, moderate pain or headache.    Yes [provider]  albuterol (PROVENTIL) (2.5 MG/3ML) 0.083% nebulizer solution Take 2.5 mg by nebulization every 4 (four) hours. 10/12/20  Yes [provider]  amLODipine (NORVASC) 5 MG tablet Take 1 tablet (5 mg total) by mouth daily. 08/04/20  Yes Verta Ellen., NP  aspirin 81 MG EC tablet Take 1 tablet (81 mg total) by mouth daily. 07/09/18  Yes Rehman, Mechele Dawley, MD  carvedilol (COREG) 3.125 MG tablet Take 1 tablet (3.125 mg total) by mouth 2 (two) times daily. 08/04/20  Yes Verta Ellen., NP  cephALEXin (KEFLEX) 500 MG capsule Take 1,000 mg by mouth 2 (two) times daily. 01/12/21  Yes [provider]  furosemide (LASIX) 20 MG tablet TAKE 1 TABLET BY MOUTH EVERY DAY 06/03/20  Yes Almyra Deforest, PA  lisinopril (ZESTRIL) 2.5 MG tablet Take 1 tablet (2.5 mg total) by mouth daily. 12/18/19  Yes Almyra Deforest, PA  nitroGLYCERIN (NITROSTAT) 0.4 MG SL tablet Place 1 tablet (0.4 mg total) under the tongue every 5 (five) minutes x 3 doses as needed for chest pain. 03/03/15  Yes Isaiah Serge, NP  pantoprazole (PROTONIX) 40 MG tablet TAKE 1 TABLET BY MOUTH TWICE DAILY Patient taking differently: Take 40 mg by mouth 2 (two) times daily. 06/14/18  Yes Herminio Commons, MD  RANEXA 1000 MG SR tablet TAKE ONE TABLET BY MOUTH TWICE DAILY Patient taking differently: Take 1,000 mg by mouth 2 (two) times daily with a meal. 03/17/17  Yes Herminio Commons, MD  rosuvastatin (CRESTOR) 40 MG tablet Take 1 tablet (40 mg total) by mouth at bedtime. 12/17/19  Yes Almyra Deforest, PA  vitamin B-12 (CYANOCOBALAMIN) 1000 MCG tablet Take 1,000 mcg by mouth daily.   Yes [provider]  warfarin (COUMADIN) 2 MG tablet Take 1 tablet daily or as  directed Patient taking differently: Take 2 mg by mouth See admin instructions. Take '2mg'$  on Sun and Monday and 1/2 on Tuesday 2 mg on Wednesday 1/2 on Thursday 2 mg on Friday and 1/2 tab on Saturday 12/09/20  Yes Branch, Alphonse Guild, MD  budesonide (PULMICORT) 0.5 MG/2ML nebulizer solution Take 2 mLs (0.5 mg total) by nebulization 2 (two) times daily. 06/04/19 12/25/19  Shahmehdi, Valeria Batman, MD  ipratropium-albuterol (DUONEB) 0.5-2.5 (3) MG/3ML SOLN Inhale 3 mLs into the lungs every 6 (six) hours. Patient taking differently: Inhale 3 mLs into the lungs every 6 (six) hours as needed (for wheezing or shortness of breath). 06/04/19 12/25/19  ShahmehdiValeria Batman, MD  isosorbide mononitrate (IMDUR) 30 MG 24 hr tablet Take 1  tablet (30 mg total) by mouth daily. 12/18/19   Almyra Deforest, PA   Physical Exam: Vitals:   01/20/21 1915 01/20/21 1917 01/20/21 1918 01/20/21 2030  BP: 128/74 (!) 148/78  135/80  Pulse: (!) 57 65 63 63  Resp: '19 20 17 '$ (!) 22  Temp:      TempSrc:      SpO2: (!) 84% (!) 88% 92% 90%  Weight:      Height:       Constitutional: Looks acutely ill.  NAD, calm, comfortable Eyes: PERRL, lids and conjunctivae normal.  Injected conjunctiva. ENMT: Nasal cannula in place.  Mucous membranes are moist. Posterior pharynx clear of any exudate or lesions. Neck: normal, supple, no masses, no thyromegaly Respiratory: Normal respiratory effort.  Mildly decreased breath sounds with bilateral rhonchi, wheezing and scattered crackles.  No accessory muscle use.  Cardiovascular: Regular rate and rhythm, no murmurs / rubs / gallops. No extremity edema. 2+ pedal pulses. No carotid bruits.  Abdomen: Obese, no tenderness, no masses palpated. No hepatosplenomegaly. Bowel sounds positive.  Musculoskeletal: no clubbing / cyanosis. Good ROM, no contractures. Normal muscle tone.  Skin: no rashes, lesions, ulcers on generalized  Neurologic: CN 2-12 grossly intact. Sensation intact, DTR normal. Strength 5/5 in all 4.   Psychiatric: Normal judgment and insight. Alert and oriented x 3. Normal mood.   Labs on Admission: I have personally reviewed following labs and imaging studies  CBC: Recent Labs  Lab 01/20/21 1117  WBC 7.0  NEUTROABS 4.4  HGB 13.4  HCT 43.0  MCV 98.6  PLT 123456   Basic Metabolic Panel: Recent Labs  Lab 01/20/21 1117 01/20/21 2049  NA 138  --   K 3.4*  --   CL 91*  --   CO2 36*  --   GLUCOSE 123*  --   BUN 18  --   CREATININE 0.90  --   CALCIUM 8.5*  --   MG  --  1.8  PHOS  --  3.7   GFR: Estimated Creatinine Clearance: 101.1 mL/min (by C-G formula based on SCr of 0.9 mg/dL).  Liver Function Tests: Recent Labs  Lab 01/20/21 1117  AST 16  ALT 14  ALKPHOS 55  BILITOT 1.0  PROT 6.8  ALBUMIN 3.0*   Radiological Exams on Admission: DG Chest Portable 1 View  Result Date: 01/20/2021 CLINICAL DATA:  Shortness of breath EXAM: PORTABLE CHEST 1 VIEW COMPARISON:  05/31/2019 FINDINGS: Cardiomegaly and vascular pedicle widening. Prior median sternotomy. Hazy and streaky density at the bases. There may be small pleural effusions. No pulmonary edema or air leak. IMPRESSION: Hazy opacity at the bases which could be infection or atelectasis with small pleural effusion. Electronically Signed   By: Monte Fantasia M.D.   On: 01/20/2021 10:46    08/11/2020 echo complete W0 IEA IMPRESSIONS:   1. Left ventricular ejection fraction, by estimation, is 50 to 55%. The  left ventricle has low normal function. The left ventricle demonstrates  regional wall motion abnormalities (see scoring diagram/findings for  description). There is mild left  ventricular hypertrophy. Left ventricular diastolic parameters are  indeterminate. Although there is slow flow at the LV apex in region of  wall motion abnormality, there is no formed thrombus noted with Definity  contrast.   2. Right ventricular systolic function is normal. The right ventricular  size is normal. Tricuspid regurgitation signal  is inadequate for assessing  PA pressure.   3. The mitral valve is grossly normal. Trivial mitral valve  regurgitation.   4. The aortic valve is tricuspid. There is moderate calcification of the  aortic valve. Aortic valve regurgitation is trivial.   5. The inferior vena cava is dilated in size with >50% respiratory  variability, suggesting right atrial pressure of 8 mmHg.   EKG: Independently reviewed. Vent. rate 54 BPM PR interval 177 ms QRS duration 100 ms QT/QTcB 539/511 ms P-R-T axes 73 28 89 Vent. rate 54 BPM PR interval 177 ms QRS duration 100 ms QT/QTcB 539/511 ms P-R-T axes 73 28 89 Sinus rhythm Borderline T abnormalities, lateral leads Prolonged QT interval QT interval has prolonged since last tracing.  Assessment/Plan Principal Problem:   Acute respiratory failure with hypoxia (HCC) In the setting of   Pneumonia due to COVID-19 virus  Observation/telemetry. Continue supplemental oxygen. Continue bronchodilators as needed. Flutter valve incentive spirometry use. Dexamethasone 6 mg IVP every 24 hours. Remdesivir per pharmacy. Follow-up CBC, CMP and inflammatory markers.  Active Problems:   Hypokalemia KCl 40 mEq p.o every 4 hr x2. Magnesium sulfate 2 g IVPB.    Prolonged QT interval Avoid QT prolonging meds. Correct potassium/supplement magnesium.    Hyperlipidemia Continue Crestor 40 mg p.o. daily.    CAD (coronary artery disease) Continue antiplatelet, statin and beta-blocker.    Hypertension Continue carvedilol 3.125 mg p.o. twice daily Continue lisinopril 2.5 mg p.o. daily.    Peripheral vascular disease ([place]) On aspirin, ranolazine and rosuvastatin.    Tobacco abuse Nicotine replacement therapy as needed. Staff to provide tobacco cessation information.    GERD (gastroesophageal reflux disease) On pantoprazole 40 mEq p.o. twice daily.    DVT prophylaxis: On warfarin. Code Status:   Full code. Family Communication:   Disposition  Plan:   Patient is from:  Home.  Anticipated DC to:  Home.  Anticipated DC date:  01/22/2021.  Anticipated DC barriers: Clinical status. Consults called:   Admission status:  Observation/telemetry.   Severity of Illness: High severity after presenting with acute respiratory failure with hypoxia with new oxygen requirement in the setting of COPD exacerbation and COVID-19 disease.  The patient will need to remain in the hospital for at least 24 to 48 hours for further treatment.  Reubin Milan MD [facility]  How to contact the [facility] Attending or Consulting provider [place] or covering provider during after hours [place], for this patient?   Check the care team in [facility] and look for a) attending/consulting [facility] provider listed and b) the [facility] team listed Log into [email or web address] and use [place]'s universal password to access. If you do not have the password, please contact the hospital operator. Locate the [facility] provider you are looking for under [facility] and page to a number that you can be directly reached. If you still have difficulty reaching the provider, please page the [facility] (Director on Call) for the Hospitalists listed on amion for assistance.  01/20/2021, 10:09 PM   This document was prepared using Dragon voice recognition software and may contain some unintended transcription errors.

## 2021-01-20 NOTE — ED Provider Notes (Addendum)
[facility] EMERGENCY DEPARTMENT Provider Note   CSN: [id number] Arrival date & time: 01/20/21  0749     History Chief Complaint  Patient presents with   Cough    Clifford Jones is a 70 y.o. male.  HPI  Patient with significant medical history of CAD with CABG 2003, GERD, hypertension, AAA repair, COPD, chronic respiratory failure on 2.5 L via nasal cannula as needed presents to the emergency department with chief complaint of shortness of breath and a cough.  Patient states this started on Thursday, states it has gotten worse, he states he has a productive cough, and feeling more fatigued and short of breath.  He states generally he uses his at home oxygen only as needed but since Thursday he has felt that he has had to have it on all the time, he continues to only needed 2.5 L, he denies headaches, fevers, chills, nasal congestion, sore throat, chest pain, abdominal pain, nausea, vomit, diarrhea, he has no worsening pedal edema, no orthopnea.  He states he is vaccines COVID-19, denies recent sick contacts, denies any alleviating factors.  Past Medical History:  Diagnosis Date   Ampullary adenoma    Arthritis    Reported by patient   Cancer [facility])    Reported by patient   Claustrophobia    Closed fracture of right distal tibia    COPD (chronic obstructive pulmonary disease) ([place])    Reported by patient   Coronary artery disease    a. 1999 Inf MI;  b. 2003 CABGx6;  c. 2008 NSTEMI, occluded SVG to marginal and diagonal system. 2 DES placed to marginal system;  c. NSTEMI - 02/2012 Xience Xpedition DES to oS-OM and POBA to mid lesion;  c. 10/2013 NSTEMI/PCI: VG->OM 80 ISR, native OM 80/90 small, LIMA->LAD ok w/ 90 dLAD, D1 80, RIMA->RPL ok w/ 95 in RPL (2.5x14 Resolute DES), nl EF.   Dyspnea    Reported by patient   GERD (gastroesophageal reflux disease)    Hyperlipidemia    Hypertension    Myocardial infarction ([place])    X 6   Pneumonia    Presence of permanent cardiac pacemaker     S/P AAA repair    Tobacco abuse    Wears dentures    Wears glasses     Patient Active Problem List   Diagnosis Date Noted   Encounter for therapeutic drug monitoring 12/19/2019   LV (left ventricular) mural thrombus 12/17/2019   CAP (community acquired pneumonia) 06/01/2019   Acute hypoxemic respiratory failure ([place]) 06/01/2019   Duodenal adenoma 09/18/2018   Abnormal findings on esophagogastroduodenoscopy (EGD) 09/18/2018   Neoplasm of ampulla    Fracture of distal end of fibula 04/08/2016   Closed displaced comminuted fracture of shaft of right tibia 04/07/2016   Fracture of distal end of tibia 04/07/2016   Chest pain    Elevated troponin 03/02/2015   NSTEMI (non-ST elevated myocardial infarction) ([place]) 03/08/2012   Unstable angina ([place]) 03/04/2012   CAD (coronary artery disease)    Hypertension    Personal history of noncompliance with medical treatment    S/P AAA repair    Peripheral vascular disease ([place])    Tobacco abuse    Hyperlipidemia    CAD, bypass graft     Past Surgical History:  Procedure Laterality Date   ABDOMINAL AORTIC ANEURYSM REPAIR  2006   [place]     CORONARY ARTERY BYPASS GRAFT  2003  ENDOSCOPIC RETROGRADE CHOLANGIOPANCREATOGRAPHY (ERCP) WITH PROPOFOL N/A 03/25/2019   Procedure: ENDOSCOPIC RETROGRADE CHOLANGIOPANCREATOGRAPHY (ERCP) WITH PROPOFOL;  Surgeon: Rush Landmark Telford Nab., MD;  Location: [place];  Service: Gastroenterology;  Laterality: N/A;   ESOPHAGEAL DILATION  07/06/2018   Procedure: ESOPHAGEAL DILATION;  Surgeon: Rogene Houston, MD;  [facility];  Service: Endoscopy;;   ESOPHAGOGASTRODUODENOSCOPY N/A 07/06/2018   Procedure: ESOPHAGOGASTRODUODENOSCOPY (EGD);  Surgeon: Rogene Houston, MD;  [facility];  Service: Endoscopy;  Laterality: N/A;   ESOPHAGOGASTRODUODENOSCOPY N/A 08/16/2018   Procedure: ESOPHAGOGASTRODUODENOSCOPY (EGD);  Surgeon: Milus Banister,  MD;  Location: [facility];  Service: Endoscopy;  Laterality: N/A;   ESOPHAGOGASTRODUODENOSCOPY (EGD) WITH PROPOFOL N/A 03/25/2019   Procedure: ESOPHAGOGASTRODUODENOSCOPY (EGD) WITH PROPOFOL;  Surgeon: Rush Landmark Telford Nab., MD;  Location: [place];  Service: Gastroenterology;  Laterality: N/A;   EUS N/A 08/16/2018   Procedure: UPPER ENDOSCOPIC ULTRASOUND (EUS) RADIAL;  Surgeon: Milus Banister, MD;  [facility];  Service: Endoscopy;  Laterality: N/A;   EUS N/A 03/25/2019   Procedure: UPPER ENDOSCOPIC ULTRASOUND (EUS) RADIAL;  Surgeon: Irving Copas., MD;  Location: [place];  Service: Gastroenterology;  Laterality: N/A;   FOREIGN BODY REMOVAL  07/06/2018   Procedure: FOREIGN BODY REMOVAL;  Surgeon: Rogene Houston, MD;  [facility];  Service: Endoscopy;;   HERNIA REPAIR     umbicical   LEFT HEART CATHETERIZATION WITH CORONARY ANGIOGRAM N/A 03/05/2012   Procedure: LEFT HEART CATHETERIZATION WITH CORONARY ANGIOGRAM;  Surgeon: Hillary Bow, MD;  Location[facility] CATH LAB;  Service: Cardiovascular;  Laterality: N/A;   LEFT HEART CATHETERIZATION WITH CORONARY ANGIOGRAM N/A 10/23/2013   Procedure: LEFT HEART CATHETERIZATION WITH CORONARY ANGIOGRAM;  Surgeon: Blane Ohara, MD;  Location: [facility] CATH LAB;  Service: Cardiovascular;  Laterality: N/A;   LEFT HEART CATHETERIZATION WITH CORONARY/GRAFT ANGIOGRAM  10/22/2013   Procedure: LEFT HEART CATHETERIZATION WITH Beatrix Fetters;  Surgeon: Leonie Man, MD;  Location: [facility] CATH LAB;  Service: Cardiovascular;;   PANCREATIC STENT PLACEMENT  03/25/2019   Procedure: PANCREATIC STENT PLACEMENT;  Surgeon: Rush Landmark Telford Nab., MD;  Location: [place];  Service: Gastroenterology;;   PERCUTANEOUS CORONARY STENT INTERVENTION (PCI-S) N/A 03/07/2012   Procedure: PERCUTANEOUS CORONARY STENT INTERVENTION (PCI-S);  Surgeon: Sherren Mocha, MD;  Location: [facility] CATH LAB;  Service: Cardiovascular;  Laterality: N/A;    PERCUTANEOUS CORONARY STENT INTERVENTION (PCI-S) Right 10/23/2013   Procedure: PERCUTANEOUS CORONARY STENT INTERVENTION (PCI-S);  Surgeon: Blane Ohara, MD;  Location: [facility] CATH LAB;  Service: Cardiovascular;  Laterality: Right;   POLYPECTOMY  07/06/2018   Procedure: POLYPECTOMY;  Surgeon: Rogene Houston, MD;  [facility];  Service: Endoscopy;;  polyp at ampulla   POLYPECTOMY  03/25/2019   Procedure: AMPULLECTOMY;  Surgeon: Rush Landmark Telford Nab., MD;  Location: [place];  Service: Gastroenterology;;   Right hand surgery     RIGHT HEART CATH AND CORONARY/GRAFT ANGIOGRAPHY N/A 12/16/2019   Procedure: RIGHT HEART CATH AND CORONARY/GRAFT ANGIOGRAPHY;  Surgeon: Nelva Bush, MD;  Location: [place] CV LAB;  Service: Cardiovascular;  Laterality: N/A;   SPHINCTEROTOMY  03/25/2019   Procedure: SPHINCTEROTOMY;  Surgeon: Mansouraty, Telford Nab., MD;  Location: [place];  Service: Gastroenterology;;   TIBIA IM NAIL INSERTION Right 04/07/2016   Procedure: FIXATION OF RIGHT TIBIA FRACTURE;  Surgeon: Garald Balding, MD;  Location: [place];  Service: Orthopedics;  Laterality: Right;       Family History  Problem Relation Age of Onset   Coronary artery  disease Other    Colon cancer Neg Hx    Esophageal cancer Neg Hx    Inflammatory bowel disease Neg Hx    Liver disease Neg Hx    Pancreatic cancer Neg Hx    Rectal cancer Neg Hx    Stomach cancer Neg Hx     Social History   Tobacco Use   Smoking status: Some Days    Packs/day: 1.00    Years: 54.00    Pack years: 54.00    Types: Cigarettes    Start date: 10/04/1964    Last attempt to quit: 05/30/2019    Years since quitting: 1.6   Smokeless tobacco: Never   Tobacco comments:    since November 2020-has smoked a total of 6 packs  Vaping Use   Vaping Use: Never used  Substance Use Topics   Alcohol use: Yes    Comment: Maybe 2 beers a month and 2 shotsa month   Drug use: No    Home Medications Prior to  Admission medications   Medication Sig Start Date End Date Taking? Authorizing Provider  acetaminophen (TYLENOL) 500 MG tablet Take 1,000 mg by mouth every 6 (six) hours as needed for mild pain, moderate pain or headache.    Yes [provider]  albuterol (PROVENTIL) (2.5 MG/3ML) 0.083% nebulizer solution Take 2.5 mg by nebulization every 4 (four) hours. 10/12/20  Yes [provider]  amLODipine (NORVASC) 5 MG tablet Take 1 tablet (5 mg total) by mouth daily. 08/04/20  Yes Verta Ellen., NP  aspirin 81 MG EC tablet Take 1 tablet (81 mg total) by mouth daily. 07/09/18  Yes Rehman, Mechele Dawley, MD  carvedilol (COREG) 3.125 MG tablet Take 1 tablet (3.125 mg total) by mouth 2 (two) times daily. 08/04/20  Yes Verta Ellen., NP  cephALEXin (KEFLEX) 500 MG capsule Take 1,000 mg by mouth 2 (two) times daily. 01/12/21  Yes [provider]  furosemide (LASIX) 20 MG tablet TAKE 1 TABLET BY MOUTH EVERY DAY 06/03/20  Yes Almyra Deforest, PA  lisinopril (ZESTRIL) 2.5 MG tablet Take 1 tablet (2.5 mg total) by mouth daily. 12/18/19  Yes Almyra Deforest, PA  nitroGLYCERIN (NITROSTAT) 0.4 MG SL tablet Place 1 tablet (0.4 mg total) under the tongue every 5 (five) minutes x 3 doses as needed for chest pain. 03/03/15  Yes Isaiah Serge, NP  pantoprazole (PROTONIX) 40 MG tablet TAKE 1 TABLET BY MOUTH TWICE DAILY Patient taking differently: Take 40 mg by mouth 2 (two) times daily. 06/14/18  Yes Herminio Commons, MD  RANEXA 1000 MG SR tablet TAKE ONE TABLET BY MOUTH TWICE DAILY Patient taking differently: Take 1,000 mg by mouth 2 (two) times daily with a meal. 03/17/17  Yes Herminio Commons, MD  rosuvastatin (CRESTOR) 40 MG tablet Take 1 tablet (40 mg total) by mouth at bedtime. 12/17/19  Yes Almyra Deforest, PA  vitamin B-12 (CYANOCOBALAMIN) 1000 MCG tablet Take 1,000 mcg by mouth daily.   Yes [provider]  warfarin (COUMADIN) 2 MG tablet Take 1 tablet daily or as directed Patient taking  differently: Take 2 mg by mouth See admin instructions. Take '2mg'$  on Sun and Monday and 1/2 on Tuesday 2 mg on Wednesday 1/2 on Thursday 2 mg on Friday and 1/2 tab on Saturday 12/09/20  Yes Branch, Alphonse Guild, MD  budesonide (PULMICORT) 0.5 MG/2ML nebulizer solution Take 2 mLs (0.5 mg total) by nebulization 2 (two) times daily. 06/04/19 12/25/19  Shahmehdi, Valeria Batman,  MD  ipratropium-albuterol (DUONEB) 0.5-2.5 (3) MG/3ML SOLN Inhale 3 mLs into the lungs every 6 (six) hours. Patient taking differently: Inhale 3 mLs into the lungs every 6 (six) hours as needed (for wheezing or shortness of breath). 06/04/19 12/25/19  Deatra Apostolos, MD  isosorbide mononitrate (IMDUR) 30 MG 24 hr tablet Take 1 tablet (30 mg total) by mouth daily. 12/18/19   Almyra Deforest, [place]    Allergies    Patient has no known allergies.  Review of Systems   Review of Systems  Constitutional:  Negative for chills and fever.  HENT:  Negative for congestion and sore throat.   Respiratory:  Positive for cough and shortness of breath.   Cardiovascular:  Negative for chest pain and leg swelling.  Gastrointestinal:  Negative for abdominal pain, diarrhea, nausea and vomiting.  Genitourinary:  Negative for enuresis.  Musculoskeletal:  Negative for myalgias.  Skin:  Negative for rash.  Neurological:  Negative for headaches.  Hematological:  Does not bruise/bleed easily.   Physical Exam Updated Vital Signs BP (!) 148/78   Pulse 63   Temp 97.8 F (36.6 C) (Oral)   Resp 17   Ht 6' (1.829 m)   Wt 111.1 kg   SpO2 92%   BMI 33.23 kg/m   Physical Exam Vitals and nursing note reviewed.  Constitutional:      General: He is not in acute distress.    Appearance: He is not ill-appearing.     Comments: Deconditioned state with nasal cannula in place at 2.5 L.  HENT:     Head: Normocephalic and atraumatic.     Nose: No congestion.     Mouth/Throat:     Mouth: Mucous membranes are moist.     Pharynx: Oropharynx is clear. No oropharyngeal  exudate or posterior oropharyngeal erythema.  Eyes:     Conjunctiva/sclera: Conjunctivae normal.  Cardiovascular:     Rate and Rhythm: Normal rate and regular rhythm.     Pulses: Normal pulses.     Heart sounds: No murmur heard.   No friction rub. No gallop.  Pulmonary:     Effort: No respiratory distress.     Breath sounds: Wheezing present. No rhonchi or rales.     Comments: Patient was not in respiratory distress, satting at 98% room air while on 2.5 L nasal cannula, patient a tight sounding chest, with intermittent wheezing greater on the left versus the right. Chest:     Chest wall: No tenderness.  Abdominal:     Palpations: Abdomen is soft.     Tenderness: There is no abdominal tenderness. There is no right CVA tenderness or left CVA tenderness.  Musculoskeletal:     Right lower leg: No edema.     Left lower leg: No edema.  Skin:    General: Skin is warm and dry.  Neurological:     Mental Status: He is alert.  Psychiatric:        Mood and Affect: Mood normal.    ED Results / Procedures / Treatments   Labs (all labs ordered are listed, but only abnormal results are displayed) Labs Reviewed  RESP PANEL BY RT-PCR (FLU A&B, COVID) ARPGX2 - Abnormal; Notable for the following components:      Result Value   SARS Coronavirus 2 by RT PCR POSITIVE (*)    All other components within normal limits  COMPREHENSIVE METABOLIC PANEL - Abnormal; Notable for the following components:   Potassium 3.4 (*)    Chloride 91 (*)  CO2 36 (*)    Glucose, Bld 123 (*)    Calcium 8.5 (*)    Albumin 3.0 (*)    All other components within normal limits  BRAIN NATRIURETIC PEPTIDE - Abnormal; Notable for the following components:   B Natriuretic Peptide 280.0 (*)    All other components within normal limits  BLOOD GAS, VENOUS - Abnormal; Notable for the following components:   pCO2, Ven 83.8 (*)    Bicarbonate 33.4 (*)    Acid-Base Excess 13.6 (*)    All other components within normal  limits  CBC WITH DIFFERENTIAL/PLATELET  TROPONIN I (HIGH SENSITIVITY)  TROPONIN I (HIGH SENSITIVITY)    EKG EKG Interpretation  Date/Time:  Wednesday January 20 2021 10:18:31 EDT Ventricular Rate:  54 PR Interval:  177 QRS Duration: 100 QT Interval:  539 QTC Calculation: 511 R Axis:   28 Text Interpretation: Sinus rhythm Borderline T abnormalities, lateral leads Prolonged QT interval No significant change since last tracing Confirmed by Calvert Cantor [phone]) on 01/20/2021 10:21:23 AM  Radiology DG Chest Portable 1 View  Result Date: 01/20/2021 CLINICAL DATA:  Shortness of breath EXAM: PORTABLE CHEST 1 VIEW COMPARISON:  05/31/2019 FINDINGS: Cardiomegaly and vascular pedicle widening. Prior median sternotomy. Hazy and streaky density at the bases. There may be small pleural effusions. No pulmonary edema or air leak. IMPRESSION: Hazy opacity at the bases which could be infection or atelectasis with small pleural effusion. Electronically Signed   By: Monte Fantasia M.D.   On: 01/20/2021 10:46    Procedures Procedures   Medications Ordered in ED Medications  Ipratropium-Albuterol (COMBIVENT) respimat 2 puff (2 puffs Inhalation Given 01/20/21 1938)  methylPREDNISolone sodium succinate (SOLU-MEDROL) 125 mg/2 mL injection 125 mg (125 mg Intravenous Given 01/20/21 1217)  albuterol (PROVENTIL,VENTOLIN) solution continuous neb (5 mg/hr Nebulization Given 01/20/21 1408)    ED Course  I have reviewed the triage vital signs and the nursing notes.  Pertinent labs & imaging results that were available during my care of the patient were reviewed by me and considered in my medical decision making (see chart for details).    MDM Rules/Calculators/A&P                          Initial impression-patient presents with shortness of breath and fatigue he is alert, does not appear to be in acute stress, vital signs 04 bradycardia of 53.  I am concerned for possible COPD exacerbation, will obtain basic lab  work-up, provide patient with albuterol, steroids, and continue to evaluate.  Work-up-CBC unremarkable, CMP shows slight hypokalemia 3.4, elevated CO2 of 36, hyperglycemia 123, VBG shows hypercapnia CO2 of 83, pH of 7.3 BMP 280, negative delta troponin, chest x-ray reveals hazy opacities at the bases which could be infectious versus atelectasis with small pleural effusion.  EKG sinus bradycardia without signs of ischemia.  Reassessment-patient was given Combivent, continues to have a tight sounding chest with continuous wheezing.  Will start patient on a continuous neb and reassess.  Patient is reassessed chest sounds improved but continues to have wheezing and rhonchi heard throughout the lung fields has O2 sats in the low 90s.  Patient noted to have hypercapnia but with a pH of 7.3.  Patient is compensating at this time.  He is not lethargic, unclear if this is his baseline will defer CPAP at this time.Marland Kitchen  He is COVID-positive which I suspect is the cause of his increase respiratory distress.   Patient was ambulated  on his normal amount of O2 2.5 and decompensate down to 87%, due to his hypoxia recent new COVID diagnosis and CO2 retaining recommend admission.  Patient agreement this plan.  Will consult hospitalist team.  Consult-spoke with Dr. Olevia Bowens who will admit the patient.   Rule out- I have low suspicion for ACS as history is atypical, patient has no cardiac history, EKG was sinus bradycardia without signs of ischemia, patient has a delta troponin.  The suspicion for heart blocks as there is no abnormality seen on EKG, reviewing patient's chart he is chronically in the 50s this appears to be his baseline.  Low suspicion for PE as patient denies pleuritic chest pain, patient denies leg pain, no pedal edema noted on exam, presentation inconsistent etiology more consistent COPD exacerbation likely secondary due to [place].  Low suspicion for AAA or aortic dissection as history is atypical, patient  has low risk factors.  Low suspicion for systemic infection as patient is nontoxic-appearing, vital signs reassuring, no obvious source infection noted on exam.   Plan-admit to acute on chronic respiratory failure with hypercapnia likely secondary due to COVID.  Final Clinical Impression(s) / ED Diagnoses Final diagnoses:  Acute on chronic respiratory failure with hypercapnia ([place])  COVID    Rx / DC Orders ED Discharge Orders     None        Marcello Fennel, PA-C 01/20/21 1947    Marcello Fennel, PA-C 01/20/21 1954    Truddie Hidden, MD 01/22/21 2057

## 2021-01-20 NOTE — ED Notes (Signed)
Upon introducing myself to pt, pt o2 was 85% on 2.5 L. This RN increased oxygen to 3L and pt now has an O2 level of 92%. Will continue to monitor. Pt encouraged to take deep breaths.

## 2021-01-20 NOTE — Progress Notes (Signed)
Patient desat into upper 34s.  Raised flow to 4L.  Patient now at 91% and seems to be sustaining there.

## 2021-01-21 ENCOUNTER — Encounter (HOSPITAL_COMMUNITY): Payer: Self-pay | Admitting: Internal Medicine

## 2021-01-21 DIAGNOSIS — Z9981 Dependence on supplemental oxygen: Secondary | ICD-10-CM | POA: Diagnosis not present

## 2021-01-21 DIAGNOSIS — M1990 Unspecified osteoarthritis, unspecified site: Secondary | ICD-10-CM | POA: Diagnosis present

## 2021-01-21 DIAGNOSIS — Z8701 Personal history of pneumonia (recurrent): Secondary | ICD-10-CM | POA: Diagnosis not present

## 2021-01-21 DIAGNOSIS — U071 COVID-19: Principal | ICD-10-CM

## 2021-01-21 DIAGNOSIS — I2583 Coronary atherosclerosis due to lipid rich plaque: Secondary | ICD-10-CM

## 2021-01-21 DIAGNOSIS — E876 Hypokalemia: Secondary | ICD-10-CM

## 2021-01-21 DIAGNOSIS — I10 Essential (primary) hypertension: Secondary | ICD-10-CM | POA: Diagnosis not present

## 2021-01-21 DIAGNOSIS — J1282 Pneumonia due to coronavirus disease 2019: Secondary | ICD-10-CM | POA: Diagnosis present

## 2021-01-21 DIAGNOSIS — I25810 Atherosclerosis of coronary artery bypass graft(s) without angina pectoris: Secondary | ICD-10-CM | POA: Diagnosis present

## 2021-01-21 DIAGNOSIS — J9622 Acute and chronic respiratory failure with hypercapnia: Secondary | ICD-10-CM | POA: Diagnosis not present

## 2021-01-21 DIAGNOSIS — E119 Type 2 diabetes mellitus without complications: Secondary | ICD-10-CM | POA: Diagnosis not present

## 2021-01-21 DIAGNOSIS — Z79899 Other long term (current) drug therapy: Secondary | ICD-10-CM | POA: Diagnosis not present

## 2021-01-21 DIAGNOSIS — I2510 Atherosclerotic heart disease of native coronary artery without angina pectoris: Secondary | ICD-10-CM | POA: Diagnosis not present

## 2021-01-21 DIAGNOSIS — E1151 Type 2 diabetes mellitus with diabetic peripheral angiopathy without gangrene: Secondary | ICD-10-CM | POA: Diagnosis present

## 2021-01-21 DIAGNOSIS — J441 Chronic obstructive pulmonary disease with (acute) exacerbation: Secondary | ICD-10-CM | POA: Diagnosis present

## 2021-01-21 DIAGNOSIS — E782 Mixed hyperlipidemia: Secondary | ICD-10-CM | POA: Diagnosis not present

## 2021-01-21 DIAGNOSIS — J9601 Acute respiratory failure with hypoxia: Secondary | ICD-10-CM | POA: Diagnosis present

## 2021-01-21 DIAGNOSIS — E785 Hyperlipidemia, unspecified: Secondary | ICD-10-CM | POA: Diagnosis present

## 2021-01-21 DIAGNOSIS — I714 Abdominal aortic aneurysm, without rupture: Secondary | ICD-10-CM | POA: Diagnosis present

## 2021-01-21 DIAGNOSIS — F17210 Nicotine dependence, cigarettes, uncomplicated: Secondary | ICD-10-CM | POA: Diagnosis present

## 2021-01-21 DIAGNOSIS — K219 Gastro-esophageal reflux disease without esophagitis: Secondary | ICD-10-CM

## 2021-01-21 DIAGNOSIS — I252 Old myocardial infarction: Secondary | ICD-10-CM | POA: Diagnosis not present

## 2021-01-21 DIAGNOSIS — J9621 Acute and chronic respiratory failure with hypoxia: Secondary | ICD-10-CM | POA: Diagnosis present

## 2021-01-21 DIAGNOSIS — Z720 Tobacco use: Secondary | ICD-10-CM | POA: Diagnosis not present

## 2021-01-21 DIAGNOSIS — E669 Obesity, unspecified: Secondary | ICD-10-CM | POA: Diagnosis present

## 2021-01-21 DIAGNOSIS — Z6831 Body mass index (BMI) 31.0-31.9, adult: Secondary | ICD-10-CM | POA: Diagnosis not present

## 2021-01-21 DIAGNOSIS — J440 Chronic obstructive pulmonary disease with acute lower respiratory infection: Secondary | ICD-10-CM | POA: Diagnosis present

## 2021-01-21 DIAGNOSIS — Z8679 Personal history of other diseases of the circulatory system: Secondary | ICD-10-CM | POA: Diagnosis not present

## 2021-01-21 DIAGNOSIS — Z8249 Family history of ischemic heart disease and other diseases of the circulatory system: Secondary | ICD-10-CM | POA: Diagnosis not present

## 2021-01-21 LAB — HEMOGLOBIN A1C
Hgb A1c MFr Bld: 7.1 % — ABNORMAL HIGH (ref 4.8–5.6)
Mean Plasma Glucose: 157.07 mg/dL

## 2021-01-21 LAB — COMPREHENSIVE METABOLIC PANEL
ALT: 15 U/L (ref 0–44)
AST: 18 U/L (ref 15–41)
Albumin: 3.2 g/dL — ABNORMAL LOW (ref 3.5–5.0)
Alkaline Phosphatase: 61 U/L (ref 38–126)
Anion gap: 10 (ref 5–15)
BUN: 19 mg/dL (ref 8–23)
CO2: 35 mmol/L — ABNORMAL HIGH (ref 22–32)
Calcium: 8.8 mg/dL — ABNORMAL LOW (ref 8.9–10.3)
Chloride: 92 mmol/L — ABNORMAL LOW (ref 98–111)
Creatinine, Ser: 1.01 mg/dL (ref 0.61–1.24)
GFR, Estimated: >60 mL/min (ref >60)
Glucose, Bld: 304 mg/dL — ABNORMAL HIGH (ref 70–99)
Potassium: 3.6 mmol/L (ref 3.5–5.1)
Sodium: 137 mmol/L (ref 135–145)
Total Bilirubin: 0.8 mg/dL (ref 0.3–1.2)
Total Protein: 7.3 g/dL (ref 6.5–8.1)

## 2021-01-21 LAB — CBC WITH DIFFERENTIAL/PLATELET
Abs Immature Granulocytes: 0.04 10*3/uL (ref 0.00–0.07)
Basophils Absolute: 0 10*3/uL (ref 0.0–0.1)
Basophils Relative: 0 %
Eosinophils Absolute: 0 10*3/uL (ref 0.0–0.5)
Eosinophils Relative: 0 %
HCT: 44.3 % (ref 39.0–52.0)
Hemoglobin: 14.3 g/dL (ref 13.0–17.0)
Immature Granulocytes: 1 %
Lymphocytes Relative: 21 %
Lymphs Abs: 0.7 10*3/uL (ref 0.7–4.0)
MCH: 31.4 pg (ref 26.0–34.0)
MCHC: 32.3 g/dL (ref 30.0–36.0)
MCV: 97.1 fL (ref 80.0–100.0)
Monocytes Absolute: 0.3 10*3/uL (ref 0.1–1.0)
Monocytes Relative: 7 %
Neutro Abs: 2.5 10*3/uL (ref 1.7–7.7)
Neutrophils Relative %: 71 %
Platelets: 283 10*3/uL (ref 150–400)
RBC: 4.56 MIL/uL (ref 4.22–5.81)
RDW: 12.2 % (ref 11.5–15.5)
WBC: 3.6 10*3/uL — ABNORMAL LOW (ref 4.0–10.5)
nRBC: 0 % (ref 0.0–0.2)

## 2021-01-21 LAB — GLUCOSE, CAPILLARY
Glucose-Capillary: 260 mg/dL — ABNORMAL HIGH (ref 70–99)
Glucose-Capillary: 265 mg/dL — ABNORMAL HIGH (ref 70–99)

## 2021-01-21 LAB — PROTIME-INR
INR: 3.1 — ABNORMAL HIGH (ref 0.8–1.2)
Prothrombin Time: 31.9 seconds — ABNORMAL HIGH (ref 11.4–15.2)

## 2021-01-21 LAB — FERRITIN: Ferritin: 147 ng/mL (ref 24–336)

## 2021-01-21 LAB — C-REACTIVE PROTEIN: CRP: 10.6 mg/dL — ABNORMAL HIGH (ref <1.0)

## 2021-01-21 LAB — MAGNESIUM: Magnesium: 2.2 mg/dL (ref 1.7–2.4)

## 2021-01-21 LAB — PHOSPHORUS: Phosphorus: 2.2 mg/dL — ABNORMAL LOW (ref 2.5–4.6)

## 2021-01-21 LAB — D-DIMER, QUANTITATIVE: D-Dimer, Quant: 1.26 ug/mL-FEU — ABNORMAL HIGH (ref 0.00–0.50)

## 2021-01-21 LAB — HIV ANTIBODY (ROUTINE TESTING W REFLEX): HIV Screen 4th Generation wRfx: NONREACTIVE

## 2021-01-21 MED ORDER — INSULIN GLARGINE-YFGN 100 UNIT/ML ~~LOC~~ SOLN
7.0000 [IU] | Freq: Every day | SUBCUTANEOUS | Status: DC
  Administered 2021-01-22: 7 [IU] via SUBCUTANEOUS
  Filled 2021-01-21 (×4): qty 0.07

## 2021-01-21 MED ORDER — INSULIN ASPART 100 UNIT/ML IJ SOLN
0.0000 [IU] | Freq: Every day | INTRAMUSCULAR | Status: DC
  Administered 2021-01-21 – 2021-01-22 (×2): 3 [IU] via SUBCUTANEOUS
  Administered 2021-01-23: 4 [IU] via SUBCUTANEOUS

## 2021-01-21 MED ORDER — ZINC SULFATE 220 (50 ZN) MG PO CAPS
220.0000 mg | ORAL_CAPSULE | Freq: Every day | ORAL | Status: DC
  Administered 2021-01-21 – 2021-01-24 (×4): 220 mg via ORAL
  Filled 2021-01-21 (×4): qty 1

## 2021-01-21 MED ORDER — INSULIN ASPART 100 UNIT/ML IJ SOLN
0.0000 [IU] | Freq: Three times a day (TID) | INTRAMUSCULAR | Status: DC
  Administered 2021-01-22: 5 [IU] via SUBCUTANEOUS
  Administered 2021-01-22: 15 [IU] via SUBCUTANEOUS
  Administered 2021-01-22: 5 [IU] via SUBCUTANEOUS
  Administered 2021-01-23 (×2): 11 [IU] via SUBCUTANEOUS
  Administered 2021-01-23: 8 [IU] via SUBCUTANEOUS
  Administered 2021-01-24: 15 [IU] via SUBCUTANEOUS
  Administered 2021-01-24: 5 [IU] via SUBCUTANEOUS

## 2021-01-21 MED ORDER — METHYLPREDNISOLONE SODIUM SUCC 125 MG IJ SOLR
60.0000 mg | Freq: Two times a day (BID) | INTRAMUSCULAR | Status: DC
  Administered 2021-01-21 – 2021-01-24 (×7): 60 mg via INTRAVENOUS
  Filled 2021-01-21 (×7): qty 2

## 2021-01-21 MED ORDER — ASCORBIC ACID 500 MG PO TABS
500.0000 mg | ORAL_TABLET | Freq: Every day | ORAL | Status: DC
  Administered 2021-01-21 – 2021-01-24 (×4): 500 mg via ORAL
  Filled 2021-01-21 (×4): qty 1

## 2021-01-21 MED ORDER — SORBITOL 70 % SOLN
960.0000 mL | TOPICAL_OIL | Freq: Once | ORAL | Status: DC | PRN
  Filled 2021-01-21: qty 473

## 2021-01-21 MED ORDER — MAGNESIUM HYDROXIDE 400 MG/5ML PO SUSP
15.0000 mL | Freq: Every day | ORAL | Status: DC | PRN
  Administered 2021-01-21: 15 mL via ORAL
  Filled 2021-01-21: qty 30

## 2021-01-21 MED ORDER — FLUTICASONE FUROATE-VILANTEROL 200-25 MCG/INH IN AEPB
1.0000 | INHALATION_SPRAY | Freq: Every day | RESPIRATORY_TRACT | Status: DC
  Administered 2021-01-21 – 2021-01-24 (×4): 1 via RESPIRATORY_TRACT
  Filled 2021-01-21: qty 28

## 2021-01-21 NOTE — Progress Notes (Signed)
ANTICOAGULATION CONSULT NOTE -   Pharmacy Consult for coumadin Indication:  mural thrombus  No Known Allergies  Patient Measurements: Height: 6' (182.9 cm) Weight: 106.3 kg (234 lb 5.6 oz) IBW/kg (Calculated) : 77.6   Vital Signs: Temp: 97.8 F (36.6 C) (08/04 0607) Temp Source: Oral (08/04 0607) BP: 143/56 (08/04 0607) Pulse Rate: 59 (08/04 0607)  Labs: Recent Labs    01/20/21 1117 01/20/21 1312 01/20/21 2123 01/21/21 0452  HGB 13.4  --   --  14.3  HCT 43.0  --   --  44.3  PLT 230  --   --  283  LABPROT  --   --  29.9* 31.9*  INR  --   --  2.9* 3.1*  CREATININE 0.90  --   --  1.01  TROPONINIHS 17 16  --   --      Estimated Creatinine Clearance: 88.2 mL/min (by C-G formula based on SCr of 1.01 mg/dL).   Medical History: Past Medical History:  Diagnosis Date   Ampullary adenoma    Arthritis    Reported by patient   Cancer [facility])    Reported by patient   Claustrophobia    Closed fracture of right distal tibia    COPD (chronic obstructive pulmonary disease) ([place])    Reported by patient   Coronary artery disease    a. 1999 Inf MI;  b. 2003 CABGx6;  c. 2008 NSTEMI, occluded SVG to marginal and diagonal system. 2 DES placed to marginal system;  c. NSTEMI - 02/2012 Xience Xpedition DES to oS-OM and POBA to mid lesion;  c. 10/2013 NSTEMI/PCI: VG->OM 80 ISR, native OM 80/90 small, LIMA->LAD ok w/ 90 dLAD, D1 80, RIMA->RPL ok w/ 95 in RPL (2.5x14 Resolute DES), nl EF.   Dyspnea    Reported by patient   GERD (gastroesophageal reflux disease)    Hyperlipidemia    Hypertension    Myocardial infarction ([facility])    X 6   Pneumonia    Presence of permanent cardiac pacemaker    S/P AAA repair    Tobacco abuse    Wears dentures    Wears glasses     Medications:  See medication history  Assessment: 70 yo man to continue coumadin from home.  He has taken his dose for today.  Hg 13.4 INR 3.1, supratherapeutic  Goal of Therapy:  INR 2-3 Monitor platelets by  anticoagulation protocol: Yes   Plan:  No Coumadin today Daily PT/INR Monitor for S/S of bleeding  Isac Sarna, BS Vena [place], BCPS Clinical Pharmacist Pager [phone]  01/21/2021,11:19 AM

## 2021-01-21 NOTE — Progress Notes (Signed)
Patients CBG was 260, when this nurse attempted to give him his 8 units of insulin he refused stating he is not a diabetic and has never been one nor has he ever taken any insulin and stated he would not be starting now. I showed the patient his labs, explained his A1c levels, and attempted to explain that his diagnosis of Covid and the treatment of steroids can worsen blood sugar levels. Patient again stated that he has never checked his sugar or taken insulin and refused insulin. MD aware.

## 2021-01-21 NOTE — Progress Notes (Signed)
[facility] night shift.  The nursing staff reported earlier that the patient has been constipated and requested something to treat it.  Milk of Magnesia and MOM/sorbitol/mineral oil and glycerin enema ordered as needed if oral medications are not effective.  Tennis Must, MD.

## 2021-01-21 NOTE — Progress Notes (Signed)
Placed patient on [place] HFNC and is currently on 6L with a sat of 91%.  Will continue to monitor.

## 2021-01-21 NOTE — Progress Notes (Signed)
PROGRESS NOTE    Clifford Jones  [id number] DOB: 07/26/1950 DOA: 01/20/2021 PCP: Lavella Lemons, PA   Chief Complaint  Patient presents with   Cough    Brief admission narrative:  As per H&P written by Dr. Olevia Bowens on 01/20/2021 Clifford Jones is a 70 y.o. male with medical history significant of ampullary adenoma, osteoarthritis, claustrophobia, closed fracture of distal fibula, COPD, CAD/CABG, history of MI x6, pacemaker placement, AAA repair, GERD, hyperlipidemia, hypertension, history of pneumonia, class I obesity who is coming to the emergency department with complaints of cough, congestion, fatigue, malaise and progressively worse dyspnea with wheezing since about a week ago.  He has been using his home oxygen at 2.5 L/min at home, which he stated he normally uses as needed once or twice a month.  He denies sick contacts or travel history.  He was vaccinated, but not posted.  No fever, chills, rhinorrhea or hemoptysis.  No chest pain, palpitations, diaphoresis, PND, orthopnea, but stated he occasionally gets pitting edema of the lower extremities.  Denied abdominal pain, nausea, vomiting, diarrhea, constipation, melena or hematochezia.  No dysuria, frequency or hematuria.  No polyuria, polydipsia, polyphagia or blurred vision.   ED Course: Initial vital signs were temperature 97.8 F, pulse 53, respiration 20, BP 139/79 mmHg and O2 sat93% on 2.5 L via nasal cannula.  The patient was given Solu-Medrol 125 mg IVP x1 and remdesivir per pharmacy consult was placed.  Assessment & Plan: 1-Acute on chronic respiratory failure with hypoxia (HCC) -Patient using 2.5 L through nasal cannula at home; currently still requiring 6 L to maintain an oxygen saturation -Short of breath and increased winded sensation with activity reported -Complaining of intermittent coughing spells. -Continue treatment with steroids, remdesivir, bronchodilators, flutter valve, vitamin C, zinc and supportive care. -Follow  clinical response.  2-Hyperlipidemia -Continue statins.  3-history of hyperglycemia and based on A1c 10 2 diabetes -A1c 7.2 -CBGs elevated in the setting of a steroid usage -While inpatient we will treat with a sliding scale insulin and Levemir -Follow CBGs and adjust therapy as needed  4-CAD (coronary artery disease) -No chest pain -Continue beta-blocker, aspirin, Ranexa and statins.  5-Hypertension -Stable overall -Continue current antihypertensive regimen -Heart healthy diet has been advised.  6-Peripheral vascular disease ([place]). -Continue risk factor modification -Continue aspirin and statins.  7-Tobacco abuse -Cessation counseling provided -Continue as needed nicotine replacement therapy.  8-GERD (gastroesophageal reflux disease) -Continue PPI.  9-hypokalemia -Continue telemetry monitoring -Continue electrolyte repletion and follow trend.   DVT prophylaxis: Chronically on warfarin. Code Status: Full code Family Communication: No family at bedside. Disposition:   Status is: Inpatient  Remains inpatient appropriate because:IV treatments appropriate due to intensity of illness or inability to take PO  Dispo: The patient is from: Home              Anticipated d/c is to: Home              Patient currently is not medically stable to d/c.   Difficult to place patient No       Consultants:  None  Procedures:  See below for x-ray reports.  Antimicrobials/antiviral: Remdesivir day 2 out of 5   Subjective: Still experiencing intermittent coughing spells and shortness of breath with activity; 6 L nasal cannula supplementation needed to maintain adequate oxygen saturation.  Denies chest pain, no nausea or vomiting.  Objective: Vitals:   01/21/21 0700 01/21/21 1114 01/21/21 1306 01/21/21 1413  BP:   (!) 141/73  Pulse:   65   Resp:      Temp:   98.3 F (36.8 C)   TempSrc:   Oral   SpO2: 93% 94% 93% 94%  Weight:      Height:        Intake/Output  Summary (Last 24 hours) at 01/21/2021 1716 Last data filed at 01/21/2021 1300 Gross per 24 hour  Intake 290 ml  Output 850 ml  Net -560 ml   Filed Weights   01/20/21 0953 01/21/21 0057  Weight: 111.1 kg 106.3 kg    Examination:  General exam: Afebrile, no chest pain, reported intermittent coughing spells and having shortness of breath.  Patient is requiring 6 L nasal cannula supplementation at this time. Respiratory system: Positive tachypnea with activity; no using accessory muscle.  Bilateral rhonchi appreciated on exam. Cardiovascular system: S1 & S2 heard, RRR. No JVD, murmurs, rubs, gallops or clicks. No pedal edema. Gastrointestinal system: Abdomen is obese, nondistended, soft and nontender. No organomegaly or masses felt. Normal bowel sounds heard. Central nervous system: Alert and oriented. No focal neurological deficits. Extremities: No cyanosis or clubbing. Skin: No petechiae. Psychiatry: Judgement and insight appear normal. Mood & affect appropriate.     Data Reviewed: I have personally reviewed following labs and imaging studies  CBC: Recent Labs  Lab 01/20/21 1117 01/21/21 0452  WBC 7.0 3.6*  NEUTROABS 4.4 2.5  HGB 13.4 14.3  HCT 43.0 44.3  MCV 98.6 97.1  PLT 230 [id number]    Basic Metabolic Panel: Recent Labs  Lab 01/20/21 1117 01/20/21 2049 01/21/21 0452  NA 138  --  137  K 3.4*  --  3.6  CL 91*  --  92*  CO2 36*  --  35*  GLUCOSE 123*  --  304*  BUN 18  --  19  CREATININE 0.90  --  1.01  CALCIUM 8.5*  --  8.8*  MG  --  1.8 2.2  PHOS  --  3.7 2.2*    GFR: Estimated Creatinine Clearance: 88.2 mL/min (by C-G formula based on SCr of 1.01 mg/dL).  Liver Function Tests: Recent Labs  Lab 01/20/21 1117 01/21/21 0452  AST 16 18  ALT 14 15  ALKPHOS 55 61  BILITOT 1.0 0.8  PROT 6.8 7.3  ALBUMIN 3.0* 3.2*    CBG: Recent Labs  Lab 01/21/21 1600  GLUCAP 260*     Recent Results (from the past 240 hour(s))  Resp Panel by RT-PCR (Flu A&B, Covid)  Nasopharyngeal Swab     Status: Abnormal   Collection Time: 01/20/21  9:59 AM   Specimen: Nasopharyngeal Swab; Nasopharyngeal(NP) swabs in vial transport medium  Result Value Ref Range Status   SARS Coronavirus 2 by RT PCR POSITIVE (A) NEGATIVE Final    Comment: CRITICAL RESULT CALLED TO, READ BACK BY AND VERIFIED WITH: FIPEF,K AT 1433 ON 8.3.22 BY RUCINSKI,B (NOTE) SARS-CoV-2 target nucleic acids are DETECTED.  The SARS-CoV-2 RNA is generally detectable in upper respiratory specimens during the acute phase of infection. Positive results are indicative of the presence of the identified virus, but do not rule out bacterial infection or co-infection with other pathogens not detected by the test. Clinical correlation with patient history and other diagnostic information is necessary to determine patient infection status. The expected result is Negative.  Fact Sheet for Patients: EntrepreneurPulse.com.au  Fact Sheet for Healthcare Providers: IncredibleEmployment.be  This test is not yet approved or cleared by the [place] FDA and  has been authorized for detection  and/or diagnosis of SARS-CoV-2 by FDA under an Emergency Use Authorization (EUA).  This EUA will remain in effect (meaning t his test can be used) for the duration of  the COVID-19 declaration under Section 564(b)(1) of the Act, 21 U.S.C. section 360bbb-3(b)(1), unless the authorization is terminated or revoked sooner.     Influenza A by PCR NEGATIVE NEGATIVE Final   Influenza B by PCR NEGATIVE NEGATIVE Final    Comment: (NOTE) The Xpert Xpress SARS-CoV-2/FLU/RSV plus assay is intended as an aid in the diagnosis of influenza from Nasopharyngeal swab specimens and should not be used as a sole basis for treatment. Nasal washings and aspirates are unacceptable for Xpert Xpress SARS-CoV-2/FLU/RSV testing.  Fact Sheet for Patients: EntrepreneurPulse.com.au  Fact  Sheet for Healthcare Providers: IncredibleEmployment.be  This test is not yet approved or cleared by the [place] FDA and has been authorized for detection and/or diagnosis of SARS-CoV-2 by FDA under an Emergency Use Authorization (EUA). This EUA will remain in effect (meaning this test can be used) for the duration of the COVID-19 declaration under Section 564(b)(1) of the Act, 21 U.S.C. section 360bbb-3(b)(1), unless the authorization is terminated or revoked.  Performed at [facility], [address] [place], [place] [address]      Radiology Studies: DG Chest Portable 1 View  Result Date: 01/20/2021 CLINICAL DATA:  Shortness of breath EXAM: PORTABLE CHEST 1 VIEW COMPARISON:  05/31/2019 FINDINGS: Cardiomegaly and vascular pedicle widening. Prior median sternotomy. Hazy and streaky density at the bases. There may be small pleural effusions. No pulmonary edema or air leak. IMPRESSION: Hazy opacity at the bases which could be infection or atelectasis with small pleural effusion. Electronically Signed   By: Monte Fantasia M.D.   On: 01/20/2021 10:46     Scheduled Meds:  amLODipine  5 mg Oral Daily   vitamin C  500 mg Oral Daily   aspirin EC  81 mg Oral Daily   carvedilol  3.125 mg Oral BID   fluticasone furoate-vilanterol  1 puff Inhalation Daily   furosemide  20 mg Oral Daily   insulin aspart  0-15 Units Subcutaneous TID WC   insulin aspart  0-5 Units Subcutaneous QHS   insulin glargine-yfgn  7 Units Subcutaneous QHS   Ipratropium-Albuterol  2 puff Inhalation Q6H   isosorbide mononitrate  30 mg Oral Daily   lisinopril  2.5 mg Oral Daily   methylPREDNISolone (SOLU-MEDROL) injection  60 mg Intravenous Q12H   pantoprazole  40 mg Oral BID   ranolazine  1,000 mg Oral BID WC   rosuvastatin  40 mg Oral QHS   vitamin B-12  1,000 mcg Oral Daily   zinc sulfate  220 mg Oral Daily   Continuous Infusions:   LOS: 0 days    Time spent: 35 minutes.    Barton Dubois, MD [facility]   To contact the attending provider between 7A-7P or the covering provider during after hours 7P-7A, please log into the web site [email or web address] and access using universal [place] password for that web site. If you do not have the password, please call the hospital operator.  01/21/2021, 5:16 PM

## 2021-01-21 NOTE — Progress Notes (Signed)
Inpatient Diabetes Program Recommendations  AACE/ADA: New Consensus Statement on Inpatient Glycemic Control   Target Ranges:  Prepandial:   less than 140 mg/dL      Peak postprandial:   less than 180 mg/dL (1-2 hours)      Critically ill patients:  140 - 180 mg/dL  Results for WESTLEY, SIMONELLI (MRN [id number]) as of 01/21/2021 07:49  Ref. Range 01/20/2021 11:17 01/21/2021 04:52  Glucose Latest Ref Range: 70 - 99 mg/dL 123 (H) 304 (H)    Results for LOCHLEN, REGEHR (MRN [id number]) as of 01/21/2021 07:49  Ref. Range 03/04/2012 21:34 03/01/2015 18:30 12/14/2019 19:01  Hemoglobin A1C Latest Ref Range: 4.8 - 5.6 % 6.1 (H) 6.0 (H) 7.2 (H)   Review of Glycemic Control  Diabetes history: No Outpatient Diabetes medications: NA Current orders for Inpatient glycemic control: None; Decadron 6 mg Q24H  Inpatient Diabetes Program Recommendations:    Insulin: Please consider ordering CBGs AC&HS, Novolog 0-15 units TID with meals and Novolog 0-5 units QHS.   HbgA1C: Noted A1C of 7.2% on 12/14/19 but no documented dx of DM noted in chart.   Please consider ordering an A1C to evaluate glycemic control over the past 2-3 months.  Thanks, Barnie Alderman, RN, MSN, CDE Diabetes Coordinator Inpatient Diabetes Program [phone] (Team Pager from 8am to 5pm)

## 2021-01-21 NOTE — Progress Notes (Signed)
Patient was on 4L and sat went down to 86%.  Put patient on 5L sat is between 87 and 89% at this time.  Will probably place patient on [place] HFNC to add humidification and more flexibility.

## 2021-01-22 LAB — PROTIME-INR
INR: 3.1 — ABNORMAL HIGH (ref 0.8–1.2)
Prothrombin Time: 32 seconds — ABNORMAL HIGH (ref 11.4–15.2)

## 2021-01-22 LAB — CBC WITH DIFFERENTIAL/PLATELET
Abs Immature Granulocytes: 0.1 10*3/uL — ABNORMAL HIGH (ref 0.00–0.07)
Basophils Absolute: 0 10*3/uL (ref 0.0–0.1)
Basophils Relative: 0 %
Eosinophils Absolute: 0 10*3/uL (ref 0.0–0.5)
Eosinophils Relative: 0 %
HCT: 41.3 % (ref 39.0–52.0)
Hemoglobin: 13 g/dL (ref 13.0–17.0)
Immature Granulocytes: 1 %
Lymphocytes Relative: 11 %
Lymphs Abs: 1.2 10*3/uL (ref 0.7–4.0)
MCH: 30.9 pg (ref 26.0–34.0)
MCHC: 31.5 g/dL (ref 30.0–36.0)
MCV: 98.1 fL (ref 80.0–100.0)
Monocytes Absolute: 0.9 10*3/uL (ref 0.1–1.0)
Monocytes Relative: 9 %
Neutro Abs: 8.6 10*3/uL — ABNORMAL HIGH (ref 1.7–7.7)
Neutrophils Relative %: 79 %
Platelets: 308 10*3/uL (ref 150–400)
RBC: 4.21 MIL/uL — ABNORMAL LOW (ref 4.22–5.81)
RDW: 12.3 % (ref 11.5–15.5)
WBC: 10.9 10*3/uL — ABNORMAL HIGH (ref 4.0–10.5)
nRBC: 0 % (ref 0.0–0.2)

## 2021-01-22 LAB — COMPREHENSIVE METABOLIC PANEL
ALT: 15 U/L (ref 0–44)
AST: 16 U/L (ref 15–41)
Albumin: 3 g/dL — ABNORMAL LOW (ref 3.5–5.0)
Alkaline Phosphatase: 53 U/L (ref 38–126)
Anion gap: 8 (ref 5–15)
BUN: 21 mg/dL (ref 8–23)
CO2: 38 mmol/L — ABNORMAL HIGH (ref 22–32)
Calcium: 8.8 mg/dL — ABNORMAL LOW (ref 8.9–10.3)
Chloride: 92 mmol/L — ABNORMAL LOW (ref 98–111)
Creatinine, Ser: 0.97 mg/dL (ref 0.61–1.24)
GFR, Estimated: >60 mL/min (ref >60)
Glucose, Bld: 251 mg/dL — ABNORMAL HIGH (ref 70–99)
Potassium: 4.5 mmol/L (ref 3.5–5.1)
Sodium: 138 mmol/L (ref 135–145)
Total Bilirubin: 0.4 mg/dL (ref 0.3–1.2)
Total Protein: 6.5 g/dL (ref 6.5–8.1)

## 2021-01-22 LAB — FERRITIN: Ferritin: 104 ng/mL (ref 24–336)

## 2021-01-22 LAB — MAGNESIUM: Magnesium: 2.2 mg/dL (ref 1.7–2.4)

## 2021-01-22 LAB — PHOSPHORUS: Phosphorus: 2.8 mg/dL (ref 2.5–4.6)

## 2021-01-22 LAB — GLUCOSE, CAPILLARY
Glucose-Capillary: 228 mg/dL — ABNORMAL HIGH (ref 70–99)
Glucose-Capillary: 225 mg/dL — ABNORMAL HIGH (ref 70–99)
Glucose-Capillary: 357 mg/dL — ABNORMAL HIGH (ref 70–99)
Glucose-Capillary: 277 mg/dL — ABNORMAL HIGH (ref 70–99)

## 2021-01-22 LAB — D-DIMER, QUANTITATIVE: D-Dimer, Quant: 1.03 ug/mL-FEU — ABNORMAL HIGH (ref 0.00–0.50)

## 2021-01-22 LAB — C-REACTIVE PROTEIN: CRP: 4.5 mg/dL — ABNORMAL HIGH (ref <1.0)

## 2021-01-22 MED ORDER — SODIUM CHLORIDE 0.9 % IV SOLN
100.0000 mg | INTRAVENOUS | Status: AC
  Administered 2021-01-22 (×2): 100 mg via INTRAVENOUS
  Filled 2021-01-22 (×2): qty 20

## 2021-01-22 MED ORDER — SODIUM CHLORIDE 0.9 % IV SOLN
100.0000 mg | Freq: Every day | INTRAVENOUS | Status: DC
  Administered 2021-01-23 – 2021-01-24 (×2): 100 mg via INTRAVENOUS
  Filled 2021-01-22 (×2): qty 20

## 2021-01-22 MED ORDER — IPRATROPIUM-ALBUTEROL 20-100 MCG/ACT IN AERS
2.0000 | INHALATION_SPRAY | Freq: Two times a day (BID) | RESPIRATORY_TRACT | Status: DC
  Administered 2021-01-22 – 2021-01-24 (×4): 2 via RESPIRATORY_TRACT

## 2021-01-22 MED ORDER — LIVING WELL WITH DIABETES BOOK: Freq: Once | Status: AC

## 2021-01-22 NOTE — Progress Notes (Signed)
PROGRESS NOTE    Clifford Jones  [id number] DOB: 05-17-1951 DOA: 01/20/2021 PCP: Lavella Lemons, PA   Chief Complaint  Patient presents with   Cough    Brief admission narrative:  As per H&P written by Dr. Olevia Bowens on 01/20/2021 Clifford Jones is a 70 y.o. male with medical history significant of ampullary adenoma, osteoarthritis, claustrophobia, closed fracture of distal fibula, COPD, CAD/CABG, history of MI x6, pacemaker placement, AAA repair, GERD, hyperlipidemia, hypertension, history of pneumonia, class I obesity who is coming to the emergency department with complaints of cough, congestion, fatigue, malaise and progressively worse dyspnea with wheezing since about a week ago.  He has been using his home oxygen at 2.5 L/min at home, which he stated he normally uses as needed once or twice a month.  He denies sick contacts or travel history.  He was vaccinated, but not posted.  No fever, chills, rhinorrhea or hemoptysis.  No chest pain, palpitations, diaphoresis, PND, orthopnea, but stated he occasionally gets pitting edema of the lower extremities.  Denied abdominal pain, nausea, vomiting, diarrhea, constipation, melena or hematochezia.  No dysuria, frequency or hematuria.  No polyuria, polydipsia, polyphagia or blurred vision.   ED Course: Initial vital signs were temperature 97.8 F, pulse 53, respiration 20, BP 139/79 mmHg and O2 sat93% on 2.5 L via nasal cannula.  The patient was given Solu-Medrol 125 mg IVP x1 and remdesivir per pharmacy consult was placed.  Assessment & Plan: 1-Acute on chronic respiratory failure with hypoxia (HCC) -Patient using 2.5 L through nasal cannula at home; currently still requiring 5 L currently to maintain an oxygen saturation -Short of breath and increased winded sensation with activity reported -Complaining of intermittent coughing spells. -Continue treatment with steroids, remdesivir, bronchodilators, flutter valve, vitamin C, zinc and supportive  care. -Follow clinical response.  2-Hyperlipidemia -Continue statins.  3-history of hyperglycemia and based on A1c 10 2 diabetes -A1c 7.2 -CBGs elevated in the setting of steroids usage -While inpatient we will treat with sliding scale insulin and Levemir -Follow CBGs and adjust therapy as needed -Patient will be started on metformin at discharge.  4-CAD (coronary artery disease) -No chest pain -Continue beta-blocker, aspirin, Ranexa and statins.  5-Hypertension -Stable overall -Continue current antihypertensive regimen -Heart healthy diet has been advised.  6-Peripheral vascular disease ([place]). -Continue risk factor modification -Continue aspirin and statins.  7-Tobacco abuse -Cessation counseling provided -Continue as needed nicotine replacement therapy.  8-GERD (gastroesophageal reflux disease) -Continue PPI.  9-hypokalemia -Continue telemetry monitoring -Continue electrolyte repletion and follow trend.  10-class I obesity -Body mass index is 31.78 kg/m. -Low calorie diet, portion control and increase physical activity discussed with patient.  DVT prophylaxis: Chronically on warfarin. Code Status: Full code Family Communication: No family at bedside. Disposition:   Status is: Inpatient  Remains inpatient appropriate because:IV treatments appropriate due to intensity of illness or inability to take PO  Dispo: The patient is from: Home              Anticipated d/c is to: Home              Patient currently is not medically stable to d/c.   Difficult to place patient No       Consultants:  None  Procedures:  See below for x-ray reports.  Antimicrobials/antiviral: Remdesivir day 2 out of 5   Subjective: Experiencing intermittent coughing spells and short winded with minimal activity; requiring 5 L high flow nasal cannula supplementation.  No fever.  After  long discussion/education about the importance of treatment for new diagnosis diabetes patient  very receptive and in agreement to receive insulin therapy.  Objective: Vitals:   01/21/21 1958 01/21/21 2139 01/22/21 0439 01/22/21 0718  BP:  140/75 (!) 154/79   Pulse:  61 60   Resp:  19 18   Temp:  98 F (36.7 C) 98 F (36.7 C)   TempSrc:  Oral Oral   SpO2: 90% 91% 91% 92%  Weight:      Height:        Intake/Output Summary (Last 24 hours) at 01/22/2021 1200 Last data filed at 01/22/2021 1100 Gross per 24 hour  Intake 920 ml  Output 850 ml  Net 70 ml   Filed Weights   01/20/21 0953 01/21/21 0057  Weight: 111.1 kg 106.3 kg    Examination: General exam: Alert, awake, oriented x 3; denies chest pain, no nausea, no vomiting.  Still short winded, complaining of intermittent coughing spells and requiring 5 L nasal cannula supplementation. Respiratory system: Diffuse rhonchi bilaterally; no using accessory muscles.  No crackles. Cardiovascular system:RRR.  No rubs, no gallops, no JVD. Gastrointestinal system: Abdomen is obese, nondistended, soft and nontender. No organomegaly or masses felt. Normal bowel sounds heard. Central nervous system: Alert and oriented. No focal neurological deficits. Extremities: No cyanosis or clubbing. Skin: No petechiae. Psychiatry: Judgement and insight appear normal. Mood & affect appropriate.    Data Reviewed: I have personally reviewed following labs and imaging studies  CBC: Recent Labs  Lab 01/20/21 1117 01/21/21 0452 01/22/21 0441  WBC 7.0 3.6* 10.9*  NEUTROABS 4.4 2.5 8.6*  HGB 13.4 14.3 13.0  HCT 43.0 44.3 41.3  MCV 98.6 97.1 98.1  PLT 230 283 [id number]    Basic Metabolic Panel: Recent Labs  Lab 01/20/21 1117 01/20/21 2049 01/21/21 0452 01/22/21 0441  NA 138  --  137 138  K 3.4*  --  3.6 4.5  CL 91*  --  92* 92*  CO2 36*  --  35* 38*  GLUCOSE 123*  --  304* 251*  BUN 18  --  19 21  CREATININE 0.90  --  1.01 0.97  CALCIUM 8.5*  --  8.8* 8.8*  MG  --  1.8 2.2 2.2  PHOS  --  3.7 2.2* 2.8    GFR: Estimated Creatinine  Clearance: 91.9 mL/min (by C-G formula based on SCr of 0.97 mg/dL).  Liver Function Tests: Recent Labs  Lab 01/20/21 1117 01/21/21 0452 01/22/21 0441  AST '16 18 16  '$ ALT '14 15 15  '$ ALKPHOS 55 61 53  BILITOT 1.0 0.8 0.4  PROT 6.8 7.3 6.5  ALBUMIN 3.0* 3.2* 3.0*    CBG: Recent Labs  Lab 01/21/21 1600 01/21/21 2141 01/22/21 0743 01/22/21 1113  GLUCAP 260* 265* 228* 225*     Recent Results (from the past 240 hour(s))  Resp Panel by RT-PCR (Flu A&B, Covid) Nasopharyngeal Swab     Status: Abnormal   Collection Time: 01/20/21  9:59 AM   Specimen: Nasopharyngeal Swab; Nasopharyngeal(NP) swabs in vial transport medium  Result Value Ref Range Status   SARS Coronavirus 2 by RT PCR POSITIVE (A) NEGATIVE Final    Comment: CRITICAL RESULT CALLED TO, READ BACK BY AND VERIFIED WITH: FIPEF,K AT 1433 ON 8.3.22 BY RUCINSKI,B (NOTE) SARS-CoV-2 target nucleic acids are DETECTED.  The SARS-CoV-2 RNA is generally detectable in upper respiratory specimens during the acute phase of infection. Positive results are indicative of the presence of the identified virus, but  do not rule out bacterial infection or co-infection with other pathogens not detected by the test. Clinical correlation with patient history and other diagnostic information is necessary to determine patient infection status. The expected result is Negative.  Fact Sheet for Patients: EntrepreneurPulse.com.au  Fact Sheet for Healthcare Providers: IncredibleEmployment.be  This test is not yet approved or cleared by the [place] FDA and  has been authorized for detection and/or diagnosis of SARS-CoV-2 by FDA under an Emergency Use Authorization (EUA).  This EUA will remain in effect (meaning t his test can be used) for the duration of  the COVID-19 declaration under Section 564(b)(1) of the Act, 21 U.S.C. section 360bbb-3(b)(1), unless the authorization is terminated or revoked  sooner.     Influenza A by PCR NEGATIVE NEGATIVE Final   Influenza B by PCR NEGATIVE NEGATIVE Final    Comment: (NOTE) The Xpert Xpress SARS-CoV-2/FLU/RSV plus assay is intended as an aid in the diagnosis of influenza from Nasopharyngeal swab specimens and should not be used as a sole basis for treatment. Nasal washings and aspirates are unacceptable for Xpert Xpress SARS-CoV-2/FLU/RSV testing.  Fact Sheet for Patients: EntrepreneurPulse.com.au  Fact Sheet for Healthcare Providers: IncredibleEmployment.be  This test is not yet approved or cleared by the [place] FDA and has been authorized for detection and/or diagnosis of SARS-CoV-2 by FDA under an Emergency Use Authorization (EUA). This EUA will remain in effect (meaning this test can be used) for the duration of the COVID-19 declaration under Section 564(b)(1) of the Act, 21 U.S.C. section 360bbb-3(b)(1), unless the authorization is terminated or revoked.  Performed at [facility], [address], [place], [place] [address]      Radiology Studies: No results found.   Scheduled Meds:  amLODipine  5 mg Oral Daily   vitamin C  500 mg Oral Daily   aspirin EC  81 mg Oral Daily   carvedilol  3.125 mg Oral BID   fluticasone furoate-vilanterol  1 puff Inhalation Daily   furosemide  20 mg Oral Daily   insulin aspart  0-15 Units Subcutaneous TID WC   insulin aspart  0-5 Units Subcutaneous QHS   insulin glargine-yfgn  7 Units Subcutaneous QHS   Ipratropium-Albuterol  2 puff Inhalation BID   isosorbide mononitrate  30 mg Oral Daily   lisinopril  2.5 mg Oral Daily   methylPREDNISolone (SOLU-MEDROL) injection  60 mg Intravenous Q12H   pantoprazole  40 mg Oral BID   ranolazine  1,000 mg Oral BID WC   rosuvastatin  40 mg Oral QHS   vitamin B-12  1,000 mcg Oral Daily   zinc sulfate  220 mg Oral Daily   Continuous Infusions:  [START ON 01/23/2021] remdesivir 100 mg in NS 100 mL        LOS: 1 day    Time spent: 35 minutes.    Barton Dubois, MD [facility]   To contact the attending provider between 7A-7P or the covering provider during after hours 7P-7A, please log into the web site [email or web address] and access using universal [place] password for that web site. If you do not have the password, please call the hospital operator.  01/22/2021, 12:00 PM

## 2021-01-22 NOTE — Care Management Important Message (Signed)
Important Message  Patient Details  Name: Clifford Jones MRN: [id number] Date of Birth: 10-17-1950   Medicare Important Message Given:  Yes (asked Deanna, LPN to deliver due to contact precautions)     Tommy Medal 01/22/2021, 2:24 PM

## 2021-01-22 NOTE — Progress Notes (Signed)
Telemetry called at 0755 to inform this nurse of a HR 34 at 0653 AM, CCMD stated that night shift nurse was made aware at that time. Patients current HR is 60 bpm. MD aware, will continue to monitor.

## 2021-01-22 NOTE — Progress Notes (Signed)
ANTICOAGULATION CONSULT NOTE -   Pharmacy Consult for coumadin Indication:  mural thrombus  No Known Allergies  Patient Measurements: Height: 6' (182.9 cm) Weight: 106.3 kg (234 lb 5.6 oz) IBW/kg (Calculated) : 77.6   Vital Signs: Temp: 98 F (36.7 C) (08/05 0439) Temp Source: Oral (08/05 0439) BP: 154/79 (08/05 0439) Pulse Rate: 60 (08/05 0439)  Labs: Recent Labs    01/20/21 1117 01/20/21 1312 01/20/21 2123 01/21/21 0452 01/22/21 0441  HGB 13.4  --   --  14.3 13.0  HCT 43.0  --   --  44.3 41.3  PLT 230  --   --  283 308  LABPROT  --   --  29.9* 31.9* 32.0*  INR  --   --  2.9* 3.1* 3.1*  CREATININE 0.90  --   --  1.01 0.97  TROPONINIHS 17 16  --   --   --      Estimated Creatinine Clearance: 91.9 mL/min (by C-G formula based on SCr of 0.97 mg/dL).   Medical History: Past Medical History:  Diagnosis Date   Ampullary adenoma    Arthritis    Reported by patient   Cancer [facility])    Reported by patient   Claustrophobia    Closed fracture of right distal tibia    COPD (chronic obstructive pulmonary disease) ([place])    Reported by patient   Coronary artery disease    a. 1999 Inf MI;  b. 2003 CABGx6;  c. 2008 NSTEMI, occluded SVG to marginal and diagonal system. 2 DES placed to marginal system;  c. NSTEMI - 02/2012 Xience Xpedition DES to oS-OM and POBA to mid lesion;  c. 10/2013 NSTEMI/PCI: VG->OM 80 ISR, native OM 80/90 small, LIMA->LAD ok w/ 90 dLAD, D1 80, RIMA->RPL ok w/ 95 in RPL (2.5x14 Resolute DES), nl EF.   Dyspnea    Reported by patient   GERD (gastroesophageal reflux disease)    Hyperlipidemia    Hypertension    Myocardial infarction (HCC)    X 6   Pneumonia    Presence of permanent cardiac pacemaker    S/P AAA repair    Tobacco abuse    Wears dentures    Wears glasses     Medications:  See medication history  Assessment: 70 yo man to continue coumadin from home.  Home dose listed as 1 mg Tue,Thu,Sat; and 2 mg ROW.  INR 3.1,  supratherapeutic CBC WNL  Goal of Therapy:  INR 2-3 Monitor platelets by anticoagulation protocol: Yes   Plan:  No Coumadin today Daily PT/INR Monitor for S/S of bleeding  Margot Ables, PharmD Clinical Pharmacist 01/22/2021 9:00 AM

## 2021-01-22 NOTE — Progress Notes (Signed)
Inpatient Diabetes Program Recommendations  AACE/ADA: New Consensus Statement on Inpatient Glycemic Control   Target Ranges:  Prepandial:   less than 140 mg/dL      Peak postprandial:   less than 180 mg/dL (1-2 hours)      Critically ill patients:  140 - 180 mg/dL  Results for Clifford Jones, Clifford Jones (MRN [id number]) as of 01/22/2021 14:13  Ref. Range 01/21/2021 16:00 01/21/2021 21:41 01/22/2021 07:43 01/22/2021 11:13  Glucose-Capillary Latest Ref Range: 70 - 99 mg/dL 260 (H) 265 (H) 228 (H) 225 (H)   Results for Clifford Jones, Clifford Jones (MRN [id number]) as of 01/22/2021 14:13  Ref. Range 12/14/2019 19:01 01/21/2021 04:52  Hemoglobin A1C Latest Ref Range: 4.8 - 5.6 % 7.2 (H) 7.1 (H)   Review of Glycemic Control  Diabetes history: No Outpatient Diabetes medications: NA Current orders for Inpatient glycemic control: Semglee 7 units QHS, Novolog 0-15 units TID with meals, Novolog 0-5 units QHS; Solumedrol 60 mg Q12H   Inpatient Diabetes Program Recommendations:      HbgA1C:  A1C of 7.2% on 12/14/19 but no documented dx of DM noted in chart.   Current A1C 7.1% on 01/21/21 indicating an average glucose of 157 mg/dl over the past 2-3 months.    NOTE: Spoke with patient over the phone about new diabetes diagnosis.  Patient states that he has been told he had border line DM. Patient states that he goes to [facility] and sees S. Luciana Axe, [place]. Patient reports that he goes consistently every few months and that he has blood work 2-3 times every 6 months. Discussed A1C results (7.1% on 01/21/21) and explained what an A1C is and informed patient that his current A1C indicates an average glucose of 157 mg/dl over the past 2-3 months. Discussed basic pathophysiology of DM Type 2, basic home care, importance of checking CBGs and maintaining good CBG control to prevent long-term and short-term complications. Reviewed glucose and A1C goals. Reviewed signs and symptoms of hyperglycemia and hypoglycemia along with treatment for both. Discussed impact of  nutrition, exercise, stress, sickness, and medications on diabetes control. Informed patient that I would order Living Well with diabetes booklet and encouraged patient to read through entire book. Informed patient that he will be prescribed oral DM medication and MD notes to d/c on Metformin. Discussed Metformin and potential GI side effects.  Discussed glucose monitoring. Encouraged patient to follow up with PCP regarding DM.  Patient verbalized understanding of information discussed and he states that he has no further questions at this time related to diabetes.   RNs to provide ongoing basic DM education at bedside with this patient and engage patient to actively check blood glucose.  Thanks, Barnie Alderman, RN, MSN, CDE Diabetes Coordinator Inpatient Diabetes Program [phone] (Team Pager from 8am to 5pm)

## 2021-01-22 NOTE — Progress Notes (Signed)
  RD consulted for nutrition education regarding diabetes.   Lab Results  Component Value Date   HGBA1C 7.1 (H) 01/21/2021    RD provided "Plate Method for Diabetes" handout. Discussed different food groups and their effects on blood sugar, emphasizing carbohydrate-containing foods. Provided list of carbohydrates and recommended serving sizes of common foods.  Discussed importance of controlled and consistent carbohydrate intake throughout the day. Provided examples of ways to balance meals/snacks and encouraged intake of high-fiber, whole grain complex carbohydrates. Teach back method used.  Expect continued good compliance. Body mass index is 31.78 kg/m. Pt meets criteria for obese based on current BMI.  Current diet order is Heart Healthy, patient is consuming approximately 75-100% of meals at this time.   Labs and medications reviewed.  BMP Latest Ref Rng & Units 01/22/2021 01/21/2021 01/20/2021  Glucose 70 - 99 mg/dL 251(H) 304(H) 123(H)  BUN 8 - 23 mg/dL '21 19 18  '$ Creatinine 0.61 - 1.24 mg/dL 0.97 1.01 0.90  Sodium 135 - 145 mmol/L 138 137 138  Potassium 3.5 - 5.1 mmol/L 4.5 3.6 3.4(L)  Chloride 98 - 111 mmol/L 92(L) 92(L) 91(L)  CO2 22 - 32 mmol/L 38(H) 35(H) 36(H)  Calcium 8.9 - 10.3 mg/dL 8.8(L) 8.8(L) 8.5(L)    No further nutrition interventions warranted at this time. RD contact information provided. If additional nutrition issues arise, please re-consult RD.  Colman Cater MS,RD,CSG,LDN Contact: Shea Evans

## 2021-01-23 LAB — GLUCOSE, CAPILLARY
Glucose-Capillary: 284 mg/dL — ABNORMAL HIGH (ref 70–99)
Glucose-Capillary: 302 mg/dL — ABNORMAL HIGH (ref 70–99)
Glucose-Capillary: 349 mg/dL — ABNORMAL HIGH (ref 70–99)
Glucose-Capillary: 307 mg/dL — ABNORMAL HIGH (ref 70–99)

## 2021-01-23 LAB — COMPREHENSIVE METABOLIC PANEL
ALT: 14 U/L (ref 0–44)
AST: 15 U/L (ref 15–41)
Albumin: 3.2 g/dL — ABNORMAL LOW (ref 3.5–5.0)
Alkaline Phosphatase: 52 U/L (ref 38–126)
Anion gap: 7 (ref 5–15)
BUN: 24 mg/dL — ABNORMAL HIGH (ref 8–23)
CO2: 36 mmol/L — ABNORMAL HIGH (ref 22–32)
Calcium: 8.8 mg/dL — ABNORMAL LOW (ref 8.9–10.3)
Chloride: 95 mmol/L — ABNORMAL LOW (ref 98–111)
Creatinine, Ser: 0.91 mg/dL (ref 0.61–1.24)
GFR, Estimated: >60 mL/min (ref >60)
Glucose, Bld: 302 mg/dL — ABNORMAL HIGH (ref 70–99)
Potassium: 4.8 mmol/L (ref 3.5–5.1)
Sodium: 138 mmol/L (ref 135–145)
Total Bilirubin: 0.6 mg/dL (ref 0.3–1.2)
Total Protein: 6.9 g/dL (ref 6.5–8.1)

## 2021-01-23 LAB — FERRITIN: Ferritin: 86 ng/mL (ref 24–336)

## 2021-01-23 LAB — CBC WITH DIFFERENTIAL/PLATELET
Abs Immature Granulocytes: 0.11 10*3/uL — ABNORMAL HIGH (ref 0.00–0.07)
Basophils Absolute: 0 10*3/uL (ref 0.0–0.1)
Basophils Relative: 0 %
Eosinophils Absolute: 0 10*3/uL (ref 0.0–0.5)
Eosinophils Relative: 0 %
HCT: 42.6 % (ref 39.0–52.0)
Hemoglobin: 13.4 g/dL (ref 13.0–17.0)
Immature Granulocytes: 1 %
Lymphocytes Relative: 9 %
Lymphs Abs: 0.9 10*3/uL (ref 0.7–4.0)
MCH: 30.7 pg (ref 26.0–34.0)
MCHC: 31.5 g/dL (ref 30.0–36.0)
MCV: 97.7 fL (ref 80.0–100.0)
Monocytes Absolute: 0.5 10*3/uL (ref 0.1–1.0)
Monocytes Relative: 5 %
Neutro Abs: 8.7 10*3/uL — ABNORMAL HIGH (ref 1.7–7.7)
Neutrophils Relative %: 85 %
Platelets: 320 10*3/uL (ref 150–400)
RBC: 4.36 MIL/uL (ref 4.22–5.81)
RDW: 12.4 % (ref 11.5–15.5)
WBC: 10.3 10*3/uL (ref 4.0–10.5)
nRBC: 0 % (ref 0.0–0.2)

## 2021-01-23 LAB — D-DIMER, QUANTITATIVE: D-Dimer, Quant: 1.07 ug/mL-FEU — ABNORMAL HIGH (ref 0.00–0.50)

## 2021-01-23 LAB — PROTIME-INR
INR: 2.4 — ABNORMAL HIGH (ref 0.8–1.2)
Prothrombin Time: 25.7 seconds — ABNORMAL HIGH (ref 11.4–15.2)

## 2021-01-23 LAB — MAGNESIUM: Magnesium: 2.2 mg/dL (ref 1.7–2.4)

## 2021-01-23 LAB — C-REACTIVE PROTEIN: CRP: 2.4 mg/dL — ABNORMAL HIGH (ref <1.0)

## 2021-01-23 LAB — PHOSPHORUS: Phosphorus: 3 mg/dL (ref 2.5–4.6)

## 2021-01-23 MED ORDER — INSULIN GLARGINE-YFGN 100 UNIT/ML ~~LOC~~ SOLN
12.0000 [IU] | Freq: Every day | SUBCUTANEOUS | Status: DC
  Administered 2021-01-23: 12 [IU] via SUBCUTANEOUS
  Filled 2021-01-23 (×2): qty 0.12

## 2021-01-23 MED ORDER — WARFARIN - PHARMACIST DOSING INPATIENT: Freq: Every day | Status: DC

## 2021-01-23 MED ORDER — WARFARIN SODIUM 2 MG PO TABS
2.0000 mg | ORAL_TABLET | Freq: Once | ORAL | Status: AC
  Administered 2021-01-23: 2 mg via ORAL
  Filled 2021-01-23: qty 1

## 2021-01-23 NOTE — Progress Notes (Signed)
PROGRESS NOTE    PHILLIPS GRIM  [id number] DOB: March 07, 1951 DOA: 01/20/2021 PCP: Lavella Lemons, PA   Chief Complaint  Patient presents with   Cough    Brief admission narrative:  As per H&P written by Dr. Olevia Bowens on 01/20/2021 Clifford Jones is a 70 y.o. male with medical history significant of ampullary adenoma, osteoarthritis, claustrophobia, closed fracture of distal fibula, COPD, CAD/CABG, history of MI x6, pacemaker placement, AAA repair, GERD, hyperlipidemia, hypertension, history of pneumonia, class I obesity who is coming to the emergency department with complaints of cough, congestion, fatigue, malaise and progressively worse dyspnea with wheezing since about a week ago.  He has been using his home oxygen at 2.5 L/min at home, which he stated he normally uses as needed once or twice a month.  He denies sick contacts or travel history.  He was vaccinated, but not posted.  No fever, chills, rhinorrhea or hemoptysis.  No chest pain, palpitations, diaphoresis, PND, orthopnea, but stated he occasionally gets pitting edema of the lower extremities.  Denied abdominal pain, nausea, vomiting, diarrhea, constipation, melena or hematochezia.  No dysuria, frequency or hematuria.  No polyuria, polydipsia, polyphagia or blurred vision.   ED Course: Initial vital signs were temperature 97.8 F, pulse 53, respiration 20, BP 139/79 mmHg and O2 sat93% on 2.5 L via nasal cannula.  The patient was given Solu-Medrol 125 mg IVP x1 and remdesivir per pharmacy consult was placed.  Assessment & Plan: 1-Acute on chronic respiratory failure with hypoxia (HCC) -Patient using 2.5 L through nasal cannula at home; still requiring 5 L currently to maintain oxygen saturation above 90% -Short of breath and increased winded sensation with activity still appreciated; patient reporting intermittent coughing spells.  -Continue treatment with steroids, remdesivir, bronchodilators, flutter valve, vitamin C, zinc and  supportive care. -Follow clinical response.  2-Hyperlipidemia -Continue statins. -heart healthy diet encouraged.   3-history of hyperglycemia and based on A1c 10 2 diabetes -A1c 7.2 -CBGs elevated in the setting of steroids usage -While inpatient we will treat with sliding scale insulin and Levemir; last one increased to 12 units for better CBG control. -Follow CBGs and further adjust therapy as needed -Patient will be started on metformin at discharge.  4-CAD (coronary artery disease) -No chest pain -Continue beta-blocker, aspirin, Ranexa and statins.  5-Hypertension -Stable overall -Continue current antihypertensive regimen -Heart healthy diet has been advised.  6-Peripheral vascular disease ([place]). -Continue risk factor modification -Continue aspirin and statins.  7-Tobacco abuse -Cessation counseling provided -Continue as needed nicotine replacement therapy.  8-GERD (gastroesophageal reflux disease) -Continue PPI.  9-hypokalemia -Continue telemetry monitoring -Continue electrolyte repletion and follow trend.  10-class I obesity -Body mass index is 31.78 kg/m. -Low calorie diet, portion control and increase physical activity discussed with patient.  DVT prophylaxis: Chronically on warfarin. Code Status: Full code Family Communication: No family at bedside. Disposition:   Status is: Inpatient  Remains inpatient appropriate because:IV treatments appropriate due to intensity of illness or inability to take PO  Dispo: The patient is from: Home              Anticipated d/c is to: Home              Patient currently is not medically stable to d/c.   Difficult to place patient No       Consultants:  None  Procedures:  See below for x-ray reports.  Antimicrobials/antiviral: Remdesivir day 3 out of 5   Subjective: Still with intermittent coughing spells  and feeling short winded with exertion. Patient is requiring 5L [place] supplementation to maintain  oxygenation.  Objective: Vitals:   01/22/21 2103 01/23/21 0000 01/23/21 0400 01/23/21 0716  BP: 130/60 124/62 132/64   Pulse: 61 63 72   Resp: '18 16 14   '$ Temp: 98 F (36.7 C) 98.5 F (36.9 C) 97.9 F (36.6 C)   TempSrc: Oral Oral Oral   SpO2: 92% 96% 96% 92%  Weight:      Height:        Intake/Output Summary (Last 24 hours) at 01/23/2021 1149 Last data filed at 01/22/2021 1700 Gross per 24 hour  Intake 1560.68 ml  Output --  Net 1560.68 ml   Filed Weights   01/20/21 0953 01/21/21 0057  Weight: 111.1 kg 106.3 kg    Examination: General exam: Alert, awake, oriented x 3; reports breathing continue getting better, no CP, no nausea, no vomiting. Still with intermittent coughing spells and using 5L [place]. Respiratory system: positive rhonchi bilaterally, no wheezing, no using accessory muscles. Cardiovascular system:RRR. No murmurs, rubs, gallops. Gastrointestinal system: Abdomen is obese, nondistended, soft and nontender. No organomegaly or masses felt. Normal bowel sounds heard. Central nervous system: Alert and oriented. No focal neurological deficits. Extremities: No cyanosis, no clubbing. Skin: No petechiae. Psychiatry: Judgement and insight appear normal. Mood & affect appropriate.    Data Reviewed: I have personally reviewed following labs and imaging studies  CBC: Recent Labs  Lab 01/20/21 1117 01/21/21 0452 01/22/21 0441 01/23/21 0643  WBC 7.0 3.6* 10.9* 10.3  NEUTROABS 4.4 2.5 8.6* 8.7*  HGB 13.4 14.3 13.0 13.4  HCT 43.0 44.3 41.3 42.6  MCV 98.6 97.1 98.1 97.7  PLT 230 283 308 [id number]    Basic Metabolic Panel: Recent Labs  Lab 01/20/21 1117 01/20/21 2049 01/21/21 0452 01/22/21 0441 01/23/21 0643  NA 138  --  137 138 138  K 3.4*  --  3.6 4.5 4.8  CL 91*  --  92* 92* 95*  CO2 36*  --  35* 38* 36*  GLUCOSE 123*  --  304* 251* 302*  BUN 18  --  19 21 24*  CREATININE 0.90  --  1.01 0.97 0.91  CALCIUM 8.5*  --  8.8* 8.8* 8.8*  MG  --  1.8 2.2 2.2 2.2   PHOS  --  3.7 2.2* 2.8 3.0    GFR: Estimated Creatinine Clearance: 97.9 mL/min (by C-G formula based on SCr of 0.91 mg/dL).  Liver Function Tests: Recent Labs  Lab 01/20/21 1117 01/21/21 0452 01/22/21 0441 01/23/21 0643  AST '16 18 16 15  '$ ALT '14 15 15 14  '$ ALKPHOS 55 61 53 52  BILITOT 1.0 0.8 0.4 0.6  PROT 6.8 7.3 6.5 6.9  ALBUMIN 3.0* 3.2* 3.0* 3.2*    CBG: Recent Labs  Lab 01/22/21 1113 01/22/21 1629 01/22/21 2101 01/23/21 0740 01/23/21 1104  GLUCAP 225* 357* 277* 284* 302*     Recent Results (from the past 240 hour(s))  Resp Panel by RT-PCR (Flu A&B, Covid) Nasopharyngeal Swab     Status: Abnormal   Collection Time: 01/20/21  9:59 AM   Specimen: Nasopharyngeal Swab; Nasopharyngeal(NP) swabs in vial transport medium  Result Value Ref Range Status   SARS Coronavirus 2 by RT PCR POSITIVE (A) NEGATIVE Final    Comment: CRITICAL RESULT CALLED TO, READ BACK BY AND VERIFIED WITH: FIPEF,K AT 1433 ON 8.3.22 BY RUCINSKI,B (NOTE) SARS-CoV-2 target nucleic acids are DETECTED.  The SARS-CoV-2 RNA is generally detectable in upper respiratory  specimens during the acute phase of infection. Positive results are indicative of the presence of the identified virus, but do not rule out bacterial infection or co-infection with other pathogens not detected by the test. Clinical correlation with patient history and other diagnostic information is necessary to determine patient infection status. The expected result is Negative.  Fact Sheet for Patients: EntrepreneurPulse.com.au  Fact Sheet for Healthcare Providers: IncredibleEmployment.be  This test is not yet approved or cleared by the [place] FDA and  has been authorized for detection and/or diagnosis of SARS-CoV-2 by FDA under an Emergency Use Authorization (EUA).  This EUA will remain in effect (meaning t his test can be used) for the duration of  the COVID-19 declaration under  Section 564(b)(1) of the Act, 21 U.S.C. section 360bbb-3(b)(1), unless the authorization is terminated or revoked sooner.     Influenza A by PCR NEGATIVE NEGATIVE Final   Influenza B by PCR NEGATIVE NEGATIVE Final    Comment: (NOTE) The Xpert Xpress SARS-CoV-2/FLU/RSV plus assay is intended as an aid in the diagnosis of influenza from Nasopharyngeal swab specimens and should not be used as a sole basis for treatment. Nasal washings and aspirates are unacceptable for Xpert Xpress SARS-CoV-2/FLU/RSV testing.  Fact Sheet for Patients: EntrepreneurPulse.com.au  Fact Sheet for Healthcare Providers: IncredibleEmployment.be  This test is not yet approved or cleared by the [place] FDA and has been authorized for detection and/or diagnosis of SARS-CoV-2 by FDA under an Emergency Use Authorization (EUA). This EUA will remain in effect (meaning this test can be used) for the duration of the COVID-19 declaration under Section 564(b)(1) of the Act, 21 U.S.C. section 360bbb-3(b)(1), unless the authorization is terminated or revoked.  Performed at [facility], [address] [place], [place] [address]      Radiology Studies: No results found.   Scheduled Meds:  amLODipine  5 mg Oral Daily   vitamin C  500 mg Oral Daily   aspirin EC  81 mg Oral Daily   carvedilol  3.125 mg Oral BID   fluticasone furoate-vilanterol  1 puff Inhalation Daily   furosemide  20 mg Oral Daily   insulin aspart  0-15 Units Subcutaneous TID WC   insulin aspart  0-5 Units Subcutaneous QHS   insulin glargine-yfgn  12 Units Subcutaneous QHS   Ipratropium-Albuterol  2 puff Inhalation BID   isosorbide mononitrate  30 mg Oral Daily   lisinopril  2.5 mg Oral Daily   methylPREDNISolone (SOLU-MEDROL) injection  60 mg Intravenous Q12H   pantoprazole  40 mg Oral BID   ranolazine  1,000 mg Oral BID WC   rosuvastatin  40 mg Oral QHS   vitamin B-12  1,000 mcg Oral Daily    warfarin  2 mg Oral ONCE-1600   Warfarin - Pharmacist Dosing Inpatient   Does not apply q1600   zinc sulfate  220 mg Oral Daily   Continuous Infusions:  remdesivir 100 mg in NS 100 mL 100 mg (01/23/21 0912)     LOS: 2 days    Time spent: 35 minutes.    Barton Dubois, MD [facility]   To contact the attending provider between 7A-7P or the covering provider during after hours 7P-7A, please log into the web site [email or web address] and access using universal [place] password for that web site. If you do not have the password, please call the hospital operator.  01/23/2021, 11:49 AM

## 2021-01-23 NOTE — Progress Notes (Signed)
ANTICOAGULATION CONSULT NOTE -   Pharmacy Consult for coumadin Indication:  mural thrombus  No Known Allergies  Patient Measurements: Height: 6' (182.9 cm) Weight: 106.3 kg (234 lb 5.6 oz) IBW/kg (Calculated) : 77.6   Vital Signs: Temp: 97.9 F (36.6 C) (08/06 0400) Temp Source: Oral (08/06 0400) BP: 132/64 (08/06 0400) Pulse Rate: 72 (08/06 0400)  Labs: Recent Labs    01/20/21 1117 01/20/21 1312 01/20/21 2123 01/21/21 0452 01/22/21 0441 01/23/21 0643  HGB 13.4  --   --  14.3 13.0 13.4  HCT 43.0  --   --  44.3 41.3 42.6  PLT 230  --   --  283 308 320  LABPROT  --   --    < > 31.9* 32.0* 25.7*  INR  --   --    < > 3.1* 3.1* 2.4*  CREATININE 0.90  --   --  1.01 0.97 0.91  TROPONINIHS 17 16  --   --   --   --    < > = values in this interval not displayed.     Estimated Creatinine Clearance: 97.9 mL/min (by C-G formula based on SCr of 0.91 mg/dL).   Medical History: Past Medical History:  Diagnosis Date   Ampullary adenoma    Arthritis    Reported by patient   Cancer [facility])    Reported by patient   Claustrophobia    Closed fracture of right distal tibia    COPD (chronic obstructive pulmonary disease) ([place])    Reported by patient   Coronary artery disease    a. 1999 Inf MI;  b. 2003 CABGx6;  c. 2008 NSTEMI, occluded SVG to marginal and diagonal system. 2 DES placed to marginal system;  c. NSTEMI - 02/2012 Xience Xpedition DES to oS-OM and POBA to mid lesion;  c. 10/2013 NSTEMI/PCI: VG->OM 80 ISR, native OM 80/90 small, LIMA->LAD ok w/ 90 dLAD, D1 80, RIMA->RPL ok w/ 95 in RPL (2.5x14 Resolute DES), nl EF.   Dyspnea    Reported by patient   GERD (gastroesophageal reflux disease)    Hyperlipidemia    Hypertension    Myocardial infarction (HCC)    X 6   Pneumonia    Presence of permanent cardiac pacemaker    S/P AAA repair    Tobacco abuse    Wears dentures    Wears glasses     Medications:  See medication history  Assessment: 70 yo man to continue  coumadin from home.  Home dose listed as 1 mg Tue,Thu,Sat; and 2 mg ROW.  INR 3.1> 2.4 CBC WNL  Goal of Therapy:  INR 2-3 Monitor platelets by anticoagulation protocol: Yes   Plan:  Warfarin 2 mg x 1 dose. Daily PT/INR Monitor for S/S of bleeding  Margot Ables, PharmD Clinical Pharmacist 01/23/2021 8:58 AM

## 2021-01-24 ENCOUNTER — Encounter (HOSPITAL_COMMUNITY): Payer: Self-pay | Admitting: Internal Medicine

## 2021-01-24 DIAGNOSIS — E119 Type 2 diabetes mellitus without complications: Secondary | ICD-10-CM

## 2021-01-24 DIAGNOSIS — E782 Mixed hyperlipidemia: Secondary | ICD-10-CM

## 2021-01-24 DIAGNOSIS — J9621 Acute and chronic respiratory failure with hypoxia: Secondary | ICD-10-CM

## 2021-01-24 LAB — CBC WITH DIFFERENTIAL/PLATELET
Abs Immature Granulocytes: 0.1 10*3/uL — ABNORMAL HIGH (ref 0.00–0.07)
Basophils Absolute: 0 10*3/uL (ref 0.0–0.1)
Basophils Relative: 0 %
Eosinophils Absolute: 0 10*3/uL (ref 0.0–0.5)
Eosinophils Relative: 0 %
HCT: 43.7 % (ref 39.0–52.0)
Hemoglobin: 14.2 g/dL (ref 13.0–17.0)
Immature Granulocytes: 1 %
Lymphocytes Relative: 10 %
Lymphs Abs: 0.9 10*3/uL (ref 0.7–4.0)
MCH: 31.1 pg (ref 26.0–34.0)
MCHC: 32.5 g/dL (ref 30.0–36.0)
MCV: 95.8 fL (ref 80.0–100.0)
Monocytes Absolute: 0.6 10*3/uL (ref 0.1–1.0)
Monocytes Relative: 6 %
Neutro Abs: 7.8 10*3/uL — ABNORMAL HIGH (ref 1.7–7.7)
Neutrophils Relative %: 83 %
Platelets: 349 10*3/uL (ref 150–400)
RBC: 4.56 MIL/uL (ref 4.22–5.81)
RDW: 12.3 % (ref 11.5–15.5)
WBC: 9.5 10*3/uL (ref 4.0–10.5)
nRBC: 0 % (ref 0.0–0.2)

## 2021-01-24 LAB — COMPREHENSIVE METABOLIC PANEL
ALT: 16 U/L (ref 0–44)
AST: 13 U/L — ABNORMAL LOW (ref 15–41)
Albumin: 3.2 g/dL — ABNORMAL LOW (ref 3.5–5.0)
Alkaline Phosphatase: 55 U/L (ref 38–126)
Anion gap: 9 (ref 5–15)
BUN: 22 mg/dL (ref 8–23)
CO2: 32 mmol/L (ref 22–32)
Calcium: 8.9 mg/dL (ref 8.9–10.3)
Chloride: 94 mmol/L — ABNORMAL LOW (ref 98–111)
Creatinine, Ser: 0.77 mg/dL (ref 0.61–1.24)
GFR, Estimated: >60 mL/min (ref >60)
Glucose, Bld: 244 mg/dL — ABNORMAL HIGH (ref 70–99)
Potassium: 4.7 mmol/L (ref 3.5–5.1)
Sodium: 135 mmol/L (ref 135–145)
Total Bilirubin: 0.6 mg/dL (ref 0.3–1.2)
Total Protein: 7 g/dL (ref 6.5–8.1)

## 2021-01-24 LAB — D-DIMER, QUANTITATIVE: D-Dimer, Quant: 0.93 ug/mL-FEU — ABNORMAL HIGH (ref 0.00–0.50)

## 2021-01-24 LAB — PROTIME-INR
INR: 2.2 — ABNORMAL HIGH (ref 0.8–1.2)
Prothrombin Time: 24.1 seconds — ABNORMAL HIGH (ref 11.4–15.2)

## 2021-01-24 LAB — GLUCOSE, CAPILLARY
Glucose-Capillary: 241 mg/dL — ABNORMAL HIGH (ref 70–99)
Glucose-Capillary: 398 mg/dL — ABNORMAL HIGH (ref 70–99)

## 2021-01-24 LAB — C-REACTIVE PROTEIN: CRP: 1.3 mg/dL — ABNORMAL HIGH (ref <1.0)

## 2021-01-24 LAB — FERRITIN: Ferritin: 82 ng/mL (ref 24–336)

## 2021-01-24 LAB — MAGNESIUM: Magnesium: 2.1 mg/dL (ref 1.7–2.4)

## 2021-01-24 LAB — PHOSPHORUS: Phosphorus: 4 mg/dL (ref 2.5–4.6)

## 2021-01-24 MED ORDER — ZINC SULFATE 220 (50 ZN) MG PO CAPS: 220.0000 mg | ORAL_CAPSULE | Freq: Every day | ORAL | 1 refills | Status: DC

## 2021-01-24 MED ORDER — ASCORBIC ACID 500 MG PO TABS: 500.0000 mg | ORAL_TABLET | Freq: Every day | ORAL | 1 refills | Status: DC

## 2021-01-24 MED ORDER — WARFARIN SODIUM 2 MG PO TABS: 2.0000 mg | ORAL_TABLET | Freq: Once | ORAL | Status: DC

## 2021-01-24 MED ORDER — MAGNESIUM HYDROXIDE 400 MG/5ML PO SUSP: 15.0000 mL | Freq: Every day | ORAL | 0 refills | Status: AC | PRN

## 2021-01-24 MED ORDER — METFORMIN HCL ER 750 MG PO TB24: 750.0000 mg | ORAL_TABLET | Freq: Every day | ORAL | 11 refills | Status: DC

## 2021-01-24 MED ORDER — GUAIFENESIN-DM 100-10 MG/5ML PO SYRP: 10.0000 mL | ORAL_SOLUTION | ORAL | 0 refills | Status: DC | PRN

## 2021-01-24 MED ORDER — PREDNISONE 20 MG PO TABS: ORAL_TABLET | ORAL | 0 refills | Status: DC

## 2021-01-24 MED ORDER — WARFARIN SODIUM 2 MG PO TABS: 2.0000 mg | ORAL_TABLET | ORAL | Status: DC

## 2021-01-24 NOTE — Progress Notes (Signed)
ANTICOAGULATION CONSULT NOTE -   Pharmacy Consult for coumadin Indication:  mural thrombus  No Known Allergies  Patient Measurements: Height: 6' (182.9 cm) Weight: 106.3 kg (234 lb 5.6 oz) IBW/kg (Calculated) : 77.6   Vital Signs: Temp: 97.7 F (36.5 C) (08/07 0438) Temp Source: Oral (08/07 0438) BP: 162/81 (08/07 0438) Pulse Rate: 67 (08/07 0438)  Labs: Recent Labs    01/22/21 0441 01/23/21 0643 01/24/21 0700  HGB 13.0 13.4 14.2  HCT 41.3 42.6 43.7  PLT 308 320 349  LABPROT 32.0* 25.7* 24.1*  INR 3.1* 2.4* 2.2*  CREATININE 0.97 0.91 0.77     Estimated Creatinine Clearance: 111.4 mL/min (by C-G formula based on SCr of 0.77 mg/dL).   Medical History: Past Medical History:  Diagnosis Date   Ampullary adenoma    Arthritis    Reported by patient   Cancer [facility])    Reported by patient   Claustrophobia    Closed fracture of right distal tibia    COPD (chronic obstructive pulmonary disease) ([place])    Reported by patient   Coronary artery disease    a. 1999 Inf MI;  b. 2003 CABGx6;  c. 2008 NSTEMI, occluded SVG to marginal and diagonal system. 2 DES placed to marginal system;  c. NSTEMI - 02/2012 Xience Xpedition DES to oS-OM and POBA to mid lesion;  c. 10/2013 NSTEMI/PCI: VG->OM 80 ISR, native OM 80/90 small, LIMA->LAD ok w/ 90 dLAD, D1 80, RIMA->RPL ok w/ 95 in RPL (2.5x14 Resolute DES), nl EF.   Dyspnea    Reported by patient   GERD (gastroesophageal reflux disease)    Hyperlipidemia    Hypertension    Myocardial infarction (HCC)    X 6   Pneumonia    Presence of permanent cardiac pacemaker    S/P AAA repair    Tobacco abuse    Wears dentures    Wears glasses     Medications:  See medication history  Assessment: 70 yo man to continue coumadin from home.  Home dose listed as 1 mg Tue,Thu,Sat; and 2 mg ROW.  INR 2.2 CBC WNL  Goal of Therapy:  INR 2-3 Monitor platelets by anticoagulation protocol: Yes   Plan:  Warfarin 2 mg x 1 dose. Daily  PT/INR Monitor for S/S of bleeding  Margot Ables, PharmD Clinical Pharmacist 01/24/2021 8:57 AM

## 2021-01-24 NOTE — Discharge Summary (Signed)
Physician Discharge Summary  Clifford Jones [id number] DOB: 06-13-1951 DOA: 01/20/2021  PCP: Lavella Lemons, PA  Admit date: 01/20/2021 Discharge date: 01/24/2021  Time spent: 35 minutes  Recommendations for Outpatient Follow-up:  Repeat basic metabolic panel to evaluate lites renal function Close monitoring of patient's CBGs with further adjustment to hypoglycemic regimen as needed. Repeat chest x-ray in 6 weeks to assure complete resolution of infiltrates.   Discharge Diagnoses:  Principal Problem:   Acute on chronic respiratory failure with hypoxia (HCC) Active Problems:   Hyperlipidemia   CAD (coronary artery disease)   Hypertension   Peripheral vascular disease (HCC)   Tobacco abuse   GERD (gastroesophageal reflux disease)   Hypokalemia   Prolonged QT interval   Pneumonia due to COVID-19 virus   Type 2 diabetes mellitus without complication, without long-term current use of insulin ([place])   Discharge Condition: Stable and improved.  Discharged home with instruction to follow-up with PCP in 10 days.  Patient will also follow-up with cardiology service approval instructed.  CODE STATUS: Full code.  Diet recommendation: Heart healthy modified carbohydrate diet.  Filed Weights   01/20/21 0953 01/21/21 0057  Weight: 111.1 kg 106.3 kg    History of present illness:  As per H&P written by Dr. Olevia Bowens on 01/20/2021 Clifford Jones is a 70 y.o. male with medical history significant of ampullary adenoma, osteoarthritis, claustrophobia, closed fracture of distal fibula, COPD, CAD/CABG, history of MI x6, pacemaker placement, AAA repair, GERD, hyperlipidemia, hypertension, history of pneumonia, class I obesity who is coming to the emergency department with complaints of cough, congestion, fatigue, malaise and progressively worse dyspnea with wheezing since about a week ago.  He has been using his home oxygen at 2.5 L/min at home, which he stated he normally uses as needed once or twice a  month.  He denies sick contacts or travel history.  He was vaccinated, but not posted.  No fever, chills, rhinorrhea or hemoptysis.  No chest pain, palpitations, diaphoresis, PND, orthopnea, but stated he occasionally gets pitting edema of the lower extremities.  Denied abdominal pain, nausea, vomiting, diarrhea, constipation, melena or hematochezia.  No dysuria, frequency or hematuria.  No polyuria, polydipsia, polyphagia or blurred vision.   ED Course: Initial vital signs were temperature 97.8 F, pulse 53, respiration 20, BP 139/79 mmHg and O2 sat93% on 2.5 L via nasal cannula.  The patient was given Solu-Medrol 125 mg IVP x1 and remdesivir per pharmacy consult was placed.  Hospital Course:  1-Acute on chronic respiratory failure with hypoxia (HCC) -Patient using 2.5 L through nasal cannula at home; at discharge patient using 4L [place] supplementation. -Shortness of breath and increased winded sensation significantly improved at discharge -will continue steroids tapering, bronchodilators and mucolytics. -advise to maintain adequate hydration.  -use of flutter valve, Incentive spirometer and Vit C/Zinc encouraged.   2-Hyperlipidemia -Continue statins. -Heart healthy diet encouraged.    3-history of hyperglycemia and based on A1c 10 2 diabetes -A1c 7.2 -CBGs elevated in the setting of steroids usage -While inpatient patient received tx with sliding scale insulin and Levemir -Follow CBGs and further adjust therapy as needed -Patient started on metformin at discharge. -advised to follow modified carb diet.   4-CAD (coronary artery disease) -No chest pain -Continue beta-blocker, aspirin, Ranexa and statins. -continue outpatient follow up    5-Hypertension -Stable overall -Continue current antihypertensive regimen -Heart healthy diet has been advised.   6-Peripheral vascular disease ([place]). -Continue risk factor modification -Continue aspirin and statins for secondary  prevention.Marland Kitchen    7-Tobacco abuse -Cessation counseling provided -Continue as needed nicotine replacement therapy.   8-GERD (gastroesophageal reflux disease) -Continue PPI.   9-hypokalemia -Repleted and WNL at discharge. -Continue electrolyte repletion and follow trend.   10-class I obesity -Body mass index is 31.78 kg/m. -Low calorie diet, portion control and increase physical activity discussed with patient.  Procedures: See below for x-ray reports.  Consultations: None  Discharge Exam: Vitals:   01/24/21 0438 01/24/21 0713  BP: (!) 162/81   Pulse: 67   Resp: (!) 22   Temp: 97.7 F (36.5 C)   SpO2: 93% 93%    General: Afebrile, no chest pain, no nausea, no vomiting; reports improvement in his breathing and demonstrate good oxygen saturation on 4 L nasal cannula supplementation.  Patient feeling ready to go home. Cardiovascular: S1 and S2, no rubs, no gallops, no JVD. Respiratory: Good air movement bilaterally; no using accessory muscles.  Good saturation on 4 L.  Positive scattered rhonchi.  No wheezing Abdomen: Obese, soft, nontender, nondistended, positive bowel sounds Extremities: No cyanosis or clubbing.  Discharge Instructions   Discharge Instructions     Diet - low sodium heart healthy   Complete by: As directed    Diet Carb Modified   Complete by: As directed    Discharge instructions   Complete by: As directed    Maintain adequate hydration Follow heart healthy/modified carbohydrate diet Take medication prescribed Continue to practice 3 W's (wear a mask, wash hands frequently and wait 6 feet apart).      Allergies as of 01/24/2021   No Known Allergies      Medication List     STOP taking these medications    cephALEXin 500 MG capsule Commonly known as: KEFLEX       TAKE these medications    acetaminophen 500 MG tablet Commonly known as: TYLENOL Take 1,000 mg by mouth every 6 (six) hours as needed for mild pain, moderate pain or headache.    albuterol (2.5 MG/3ML) 0.083% nebulizer solution Commonly known as: PROVENTIL Take 2.5 mg by nebulization every 4 (four) hours.   amLODipine 5 MG tablet Commonly known as: NORVASC Take 1 tablet (5 mg total) by mouth daily.   ascorbic acid 500 MG tablet Commonly known as: VITAMIN C Take 1 tablet (500 mg total) by mouth daily. Start taking on: January 25, 2021   aspirin 81 MG EC tablet Take 1 tablet (81 mg total) by mouth daily.   budesonide 0.5 MG/2ML nebulizer solution Commonly known as: PULMICORT Take 2 mLs (0.5 mg total) by nebulization 2 (two) times daily.   carvedilol 3.125 MG tablet Commonly known as: COREG Take 1 tablet (3.125 mg total) by mouth 2 (two) times daily.   furosemide 20 MG tablet Commonly known as: LASIX TAKE 1 TABLET BY MOUTH EVERY DAY   guaiFENesin-dextromethorphan 100-10 MG/5ML syrup Commonly known as: ROBITUSSIN DM Take 10 mLs by mouth every 4 (four) hours as needed for cough.   ipratropium-albuterol 0.5-2.5 (3) MG/3ML Soln Commonly known as: DUONEB Inhale 3 mLs into the lungs every 6 (six) hours. What changed:  when to take this reasons to take this   isosorbide mononitrate 30 MG 24 hr tablet Commonly known as: IMDUR Take 1 tablet (30 mg total) by mouth daily.   lisinopril 2.5 MG tablet Commonly known as: ZESTRIL Take 1 tablet (2.5 mg total) by mouth daily.   magnesium hydroxide 400 MG/5ML suspension Commonly known as: MILK OF MAGNESIA Take 15 mLs by mouth  daily as needed for mild constipation.   metFORMIN 750 MG 24 hr tablet Commonly known as: Glucophage XR Take 1 tablet (750 mg total) by mouth daily with breakfast.   nitroGLYCERIN 0.4 MG SL tablet Commonly known as: NITROSTAT Place 1 tablet (0.4 mg total) under the tongue every 5 (five) minutes x 3 doses as needed for chest pain.   predniSONE 20 MG tablet Commonly known as: DELTASONE Take 3 tablets by mouth daily x1 day; then 2 tablets by mouth daily x2 days; then 1 tablet by mouth  daily x3 days; then half tablet by mouth daily x3 days and stop prednisone.   Ranexa 1000 MG SR tablet Generic drug: ranolazine TAKE ONE TABLET BY MOUTH TWICE DAILY What changed:  how much to take when to take this   rosuvastatin 40 MG tablet Commonly known as: CRESTOR Take 1 tablet (40 mg total) by mouth at bedtime.   vitamin B-12 1000 MCG tablet Commonly known as: CYANOCOBALAMIN Take 1,000 mcg by mouth daily.   warfarin 2 MG tablet Commonly known as: COUMADIN Take as directed. If you are unsure how to take this medication, talk to your nurse or doctor. Original instructions: Take 1 tablet (2 mg total) by mouth See admin instructions. Take '2mg'$  on Sun and Monday and 1/2 on Tuesday 2 mg on Wednesday 1/2 on Thursday 2 mg on Friday and 1/2 tab on Saturday   zinc sulfate 220 (50 Zn) MG capsule Take 1 capsule (220 mg total) by mouth daily. Start taking on: January 25, 2021       ASK your doctor about these medications    pantoprazole 40 MG tablet Commonly known as: PROTONIX TAKE 1 TABLET BY MOUTH TWICE DAILY       No Known Allergies  Follow-up Information     Lavella Lemons, PA. Schedule an appointment as soon as possible for a visit in 10 day(s).   Specialty: Physician Assistant Contact information: [place] [address] [phone]         Arnoldo Lenis, MD .   Specialty: Cardiology Contact information: [address] [place] [address] [phone]                 The results of significant diagnostics from this hospitalization (including imaging, microbiology, ancillary and laboratory) are listed below for reference.    Significant Diagnostic Studies: DG Chest Portable 1 View  Result Date: 01/20/2021 CLINICAL DATA:  Shortness of breath EXAM: PORTABLE CHEST 1 VIEW COMPARISON:  05/31/2019 FINDINGS: Cardiomegaly and vascular pedicle widening. Prior median sternotomy. Hazy and streaky density at the bases. There may be small  pleural effusions. No pulmonary edema or air leak. IMPRESSION: Hazy opacity at the bases which could be infection or atelectasis with small pleural effusion. Electronically Signed   By: Monte Fantasia M.D.   On: 01/20/2021 10:46    Microbiology: Recent Results (from the past 240 hour(s))  Resp Panel by RT-PCR (Flu A&B, Covid) Nasopharyngeal Swab     Status: Abnormal   Collection Time: 01/20/21  9:59 AM   Specimen: Nasopharyngeal Swab; Nasopharyngeal(NP) swabs in vial transport medium  Result Value Ref Range Status   SARS Coronavirus 2 by RT PCR POSITIVE (A) NEGATIVE Final    Comment: CRITICAL RESULT CALLED TO, READ BACK BY AND VERIFIED WITH: FIPEF,K AT 1433 ON 8.3.22 BY RUCINSKI,B (NOTE) SARS-CoV-2 target nucleic acids are DETECTED.  The SARS-CoV-2 RNA is generally detectable in upper respiratory specimens during the acute phase of  infection. Positive results are indicative of the presence of the identified virus, but do not rule out bacterial infection or co-infection with other pathogens not detected by the test. Clinical correlation with patient history and other diagnostic information is necessary to determine patient infection status. The expected result is Negative.  Fact Sheet for Patients: EntrepreneurPulse.com.au  Fact Sheet for Healthcare Providers: IncredibleEmployment.be  This test is not yet approved or cleared by the [place] FDA and  has been authorized for detection and/or diagnosis of SARS-CoV-2 by FDA under an Emergency Use Authorization (EUA).  This EUA will remain in effect (meaning t his test can be used) for the duration of  the COVID-19 declaration under Section 564(b)(1) of the Act, 21 U.S.C. section 360bbb-3(b)(1), unless the authorization is terminated or revoked sooner.     Influenza A by PCR NEGATIVE NEGATIVE Final   Influenza B by PCR NEGATIVE NEGATIVE Final    Comment: (NOTE) The Xpert Xpress  SARS-CoV-2/FLU/RSV plus assay is intended as an aid in the diagnosis of influenza from Nasopharyngeal swab specimens and should not be used as a sole basis for treatment. Nasal washings and aspirates are unacceptable for Xpert Xpress SARS-CoV-2/FLU/RSV testing.  Fact Sheet for Patients: EntrepreneurPulse.com.au  Fact Sheet for Healthcare Providers: IncredibleEmployment.be  This test is not yet approved or cleared by the [place] FDA and has been authorized for detection and/or diagnosis of SARS-CoV-2 by FDA under an Emergency Use Authorization (EUA). This EUA will remain in effect (meaning this test can be used) for the duration of the COVID-19 declaration under Section 564(b)(1) of the Act, 21 U.S.C. section 360bbb-3(b)(1), unless the authorization is terminated or revoked.  Performed at [facility], [address], [place], [place] [address]      Labs: Basic Metabolic Panel: Recent Labs  Lab 01/20/21 1117 01/20/21 2049 01/21/21 0452 01/22/21 0441 01/23/21 0643 01/24/21 0700  NA 138  --  137 138 138 135  K 3.4*  --  3.6 4.5 4.8 4.7  CL 91*  --  92* 92* 95* 94*  CO2 36*  --  35* 38* 36* 32  GLUCOSE 123*  --  304* 251* 302* 244*  BUN 18  --  19 21 24* 22  CREATININE 0.90  --  1.01 0.97 0.91 0.77  CALCIUM 8.5*  --  8.8* 8.8* 8.8* 8.9  MG  --  1.8 2.2 2.2 2.2 2.1  PHOS  --  3.7 2.2* 2.8 3.0 4.0   Liver Function Tests: Recent Labs  Lab 01/20/21 1117 01/21/21 0452 01/22/21 0441 01/23/21 0643 01/24/21 0700  AST '16 18 16 15 '$ 13*  ALT '14 15 15 14 16  '$ ALKPHOS 55 61 53 52 55  BILITOT 1.0 0.8 0.4 0.6 0.6  PROT 6.8 7.3 6.5 6.9 7.0  ALBUMIN 3.0* 3.2* 3.0* 3.2* 3.2*   CBC: Recent Labs  Lab 01/20/21 1117 01/21/21 0452 01/22/21 0441 01/23/21 0643 01/24/21 0700  WBC 7.0 3.6* 10.9* 10.3 9.5  NEUTROABS 4.4 2.5 8.6* 8.7* 7.8*  HGB 13.4 14.3 13.0 13.4 14.2  HCT 43.0 44.3 41.3 42.6 43.7  MCV 98.6 97.1 98.1 97.7 95.8  PLT 230  283 308 320 349   BNP (last 3 results) Recent Labs    01/20/21 1117  BNP 280.0*    CBG: Recent Labs  Lab 01/23/21 1104 01/23/21 1605 01/23/21 2112 01/24/21 0728 01/24/21 1112  GLUCAP 302* 349* 307* 241* 398*    Signed:  Barton Dubois MD.  [facility] 01/24/2021, 12:53 PM

## 2021-01-28 ENCOUNTER — Ambulatory Visit (INDEPENDENT_AMBULATORY_CARE_PROVIDER_SITE_OTHER): Payer: Medicare HMO | Admitting: *Deleted

## 2021-01-28 DIAGNOSIS — Z5181 Encounter for therapeutic drug level monitoring: Secondary | ICD-10-CM

## 2021-01-28 DIAGNOSIS — I513 Intracardiac thrombosis, not elsewhere classified: Secondary | ICD-10-CM

## 2021-01-28 LAB — POCT INR: INR: 3.4 — AB (ref 2.0–3.0)

## 2021-01-28 NOTE — Patient Instructions (Signed)
Hold warfarin tonight then 1 tablet daily except 1/2 tablet on Tuesdays, Thursdays and Saturdays Recheck before ERCP on 9/19 On Prednisone taper.  Started '20mg'$  x 3 today then '10mg'$  x 3

## 2021-02-02 ENCOUNTER — Encounter: Payer: Self-pay | Admitting: *Deleted

## 2021-02-02 ENCOUNTER — Encounter: Payer: Self-pay | Admitting: Cardiology

## 2021-02-02 ENCOUNTER — Ambulatory Visit: Payer: Medicare HMO | Admitting: Cardiology

## 2021-02-02 VITALS — BP 148/70 | HR 68 | Ht 72.0 in | Wt 226.6 lb

## 2021-02-02 DIAGNOSIS — I2510 Atherosclerotic heart disease of native coronary artery without angina pectoris: Secondary | ICD-10-CM | POA: Diagnosis not present

## 2021-02-02 DIAGNOSIS — I513 Intracardiac thrombosis, not elsewhere classified: Secondary | ICD-10-CM

## 2021-02-02 NOTE — Progress Notes (Signed)
Clinical Summary Clifford Jones is a 70 y.o.male seen today for follow up of the following medical problems.   1. CAD - history of CABG in 2003 -  h/o non-STEMI and PCI with DES to the native RPLA via the Flasher graft. He has residual VG->OM, native OM, distal LAD, and diagonal disease, which is being medically managed.     - no chest pain. Chronic SOB/DOE - sedentary lifestyle, highest level working in yard - can walk up to 2-3 blocks he thinks, though sedentary lifestyle.  - compliant with meds    -NSTEMI 11/2019 - Cardiac catheterization performed on 12/16/2019 showed 80% distal left main with heavy calcification involving the ostium of the left circumflex up to 90%, chronic total occlusion of mid and distal LAD, 80% D1 stenosis, chronic total occlusion of ramus intermedius, distal left circumflex and proximal RCA, widely patent LIMA to LAD and patent RIMA to distal RCA, chronically occluded sequential SVG to D1 and D2, chronically occluded SVG to OM.  SVG to ramus was unable to be engaged however likely occluded RCA was not seen on the ascending aortogram.  Postprocedure, medical therapy was recommended If patient has any refractory angiogram, only interventional target would be distal left main although this will be very challenging given the heavy calcification and severe stenosis in the ostial left circumflex artery.  PCI of D1 could also be considered at the time  -beta blocker dosing limited due to bradycardia  - no recent SOB/DOE,no recent edema - no recent chest pains.  - compliantw with meds    2. LE edema -no recent edema   3. HTN - compliant with meds  - he is compliant with meds - home bp's 130s/80s. Recently on prednisione.    4. Hyperlipidemia - compliant with statin     5. Ampullary adenoma - noted by recent EGD, being considered for ERCP/ ampullectomy with propofol    6.LV thrombus Echocardiogram performed on 12/15/2019 showed a small apical LV thrombus  measuring 6 x 6 mm, EF 50 to 55%, grade 2 DD, RVSP 28.6 mmHg.    07/2020 ehco: LVEF 50-55%, no thrombus though sluggish apical flow  - no bleeding on coumadin Past Medical History:  Diagnosis Date   Ampullary adenoma    Arthritis    Reported by patient   Cancer [facility])    Reported by patient   Claustrophobia    Closed fracture of right distal tibia    COPD (chronic obstructive pulmonary disease) ([place])    Reported by patient   Coronary artery disease    a. 1999 Inf MI;  b. 2003 CABGx6;  c. 2008 NSTEMI, occluded SVG to marginal and diagonal system. 2 DES placed to marginal system;  c. NSTEMI - 02/2012 Xience Xpedition DES to oS-OM and POBA to mid lesion;  c. 10/2013 NSTEMI/PCI: VG->OM 80 ISR, native OM 80/90 small, LIMA->LAD ok w/ 90 dLAD, D1 80, RIMA->RPL ok w/ 95 in RPL (2.5x14 Resolute DES), nl EF.   Dyspnea    Reported by patient   GERD (gastroesophageal reflux disease)    Hyperlipidemia    Hypertension    Myocardial infarction ([place])    X 6   Pneumonia    Presence of permanent cardiac pacemaker    S/P AAA repair    Tobacco abuse    Type 2 diabetes mellitus without complication, without long-term current use of insulin (HCC)    Wears dentures    Wears glasses      No  Known Allergies   Current Outpatient Medications  Medication Sig Dispense Refill   acetaminophen (TYLENOL) 500 MG tablet Take 1,000 mg by mouth every 6 (six) hours as needed for mild pain, moderate pain or headache.      albuterol (PROVENTIL) (2.5 MG/3ML) 0.083% nebulizer solution Take 2.5 mg by nebulization every 4 (four) hours.     amLODipine (NORVASC) 5 MG tablet Take 1 tablet (5 mg total) by mouth daily. 30 tablet 6   ascorbic acid (VITAMIN C) 500 MG tablet Take 1 tablet (500 mg total) by mouth daily. 30 tablet 1   aspirin 81 MG EC tablet Take 1 tablet (81 mg total) by mouth daily. 30 tablet 11   budesonide (PULMICORT) 0.5 MG/2ML nebulizer solution Take 2 mLs (0.5 mg total) by nebulization 2 (two) times  daily. 120 mL 1   carvedilol (COREG) 3.125 MG tablet Take 1 tablet (3.125 mg total) by mouth 2 (two) times daily. 60 tablet 6   furosemide (LASIX) 20 MG tablet TAKE 1 TABLET BY MOUTH EVERY DAY 90 tablet 1   guaiFENesin-dextromethorphan (ROBITUSSIN DM) 100-10 MG/5ML syrup Take 10 mLs by mouth every 4 (four) hours as needed for cough. 118 mL 0   ipratropium-albuterol (DUONEB) 0.5-2.5 (3) MG/3ML SOLN Inhale 3 mLs into the lungs every 6 (six) hours. (Patient taking differently: Inhale 3 mLs into the lungs every 6 (six) hours as needed (for wheezing or shortness of breath).) 360 mL 3   isosorbide mononitrate (IMDUR) 30 MG 24 hr tablet Take 1 tablet (30 mg total) by mouth daily. 90 tablet 1   lisinopril (ZESTRIL) 2.5 MG tablet Take 1 tablet (2.5 mg total) by mouth daily. 60 tablet 1   magnesium hydroxide (MILK OF MAGNESIA) 400 MG/5ML suspension Take 15 mLs by mouth daily as needed for mild constipation. 355 mL 0   metFORMIN (GLUCOPHAGE XR) 750 MG 24 hr tablet Take 1 tablet (750 mg total) by mouth daily with breakfast. 30 tablet 11   nitroGLYCERIN (NITROSTAT) 0.4 MG SL tablet Place 1 tablet (0.4 mg total) under the tongue every 5 (five) minutes x 3 doses as needed for chest pain. 25 tablet 4   pantoprazole (PROTONIX) 40 MG tablet TAKE 1 TABLET BY MOUTH TWICE DAILY (Patient taking differently: Take 40 mg by mouth 2 (two) times daily.) 60 tablet 3   predniSONE (DELTASONE) 20 MG tablet Take 3 tablets by mouth daily x1 day; then 2 tablets by mouth daily x2 days; then 1 tablet by mouth daily x3 days; then half tablet by mouth daily x3 days and stop prednisone. 15 tablet 0   RANEXA 1000 MG SR tablet TAKE ONE TABLET BY MOUTH TWICE DAILY 30 tablet 0   rosuvastatin (CRESTOR) 40 MG tablet Take 1 tablet (40 mg total) by mouth at bedtime. 90 tablet 1   vitamin B-12 (CYANOCOBALAMIN) 1000 MCG tablet Take 1,000 mcg by mouth daily.     warfarin (COUMADIN) 2 MG tablet Take 1 tablet (2 mg total) by mouth See admin  instructions. Take '2mg'$  on Sun and Monday and 1/2 on Tuesday 2 mg on Wednesday 1/2 on Thursday 2 mg on Friday and 1/2 tab on Saturday     zinc sulfate 220 (50 Zn) MG capsule Take 1 capsule (220 mg total) by mouth daily. 30 capsule 1   No current facility-administered medications for this visit.     Past Surgical History:  Procedure Laterality Date   ABDOMINAL AORTIC ANEURYSM REPAIR  2006   APPENDECTOMY     CORONARY  ANGIOPLASTY WITH STENT PLACEMENT     CORONARY ARTERY BYPASS GRAFT  2003   ENDOSCOPIC RETROGRADE CHOLANGIOPANCREATOGRAPHY (ERCP) WITH PROPOFOL N/A 03/25/2019   Procedure: ENDOSCOPIC RETROGRADE CHOLANGIOPANCREATOGRAPHY (ERCP) WITH PROPOFOL;  Surgeon: Rush Landmark Telford Nab., MD;  Location: [place];  Service: Gastroenterology;  Laterality: N/A;   ESOPHAGEAL DILATION  07/06/2018   Procedure: ESOPHAGEAL DILATION;  Surgeon: Rogene Houston, MD;  [facility];  Service: Endoscopy;;   ESOPHAGOGASTRODUODENOSCOPY N/A 07/06/2018   Procedure: ESOPHAGOGASTRODUODENOSCOPY (EGD);  Surgeon: Rogene Houston, MD;  [facility];  Service: Endoscopy;  Laterality: N/A;   ESOPHAGOGASTRODUODENOSCOPY N/A 08/16/2018   Procedure: ESOPHAGOGASTRODUODENOSCOPY (EGD);  Surgeon: Milus Banister, MD;  Location[facility];  Service: Endoscopy;  Laterality: N/A;   ESOPHAGOGASTRODUODENOSCOPY (EGD) WITH PROPOFOL N/A 03/25/2019   Procedure: ESOPHAGOGASTRODUODENOSCOPY (EGD) WITH PROPOFOL;  Surgeon: Rush Landmark Telford Nab., MD;  Location: [place];  Service: Gastroenterology;  Laterality: N/A;   EUS N/A 08/16/2018   Procedure: UPPER ENDOSCOPIC ULTRASOUND (EUS) RADIAL;  Surgeon: Milus Banister, MD;  Location: [facility];  Service: Endoscopy;  Laterality: N/A;   EUS N/A 03/25/2019   Procedure: UPPER ENDOSCOPIC ULTRASOUND (EUS) RADIAL;  Surgeon: Irving Copas., MD;  Location: [place];  Service: Gastroenterology;  Laterality: N/A;   FOREIGN BODY REMOVAL  07/06/2018    Procedure: FOREIGN BODY REMOVAL;  Surgeon: Rogene Houston, MD;  [facility];  Service: Endoscopy;;   HERNIA REPAIR     umbicical   LEFT HEART CATHETERIZATION WITH CORONARY ANGIOGRAM N/A 03/05/2012   Procedure: LEFT HEART CATHETERIZATION WITH CORONARY ANGIOGRAM;  Surgeon: Hillary Bow, MD;  Location: [facility] CATH LAB;  Service: Cardiovascular;  Laterality: N/A;   LEFT HEART CATHETERIZATION WITH CORONARY ANGIOGRAM N/A 10/23/2013   Procedure: LEFT HEART CATHETERIZATION WITH CORONARY ANGIOGRAM;  Surgeon: Blane Ohara, MD;  Location: [facility] CATH LAB;  Service: Cardiovascular;  Laterality: N/A;   LEFT HEART CATHETERIZATION WITH CORONARY/GRAFT ANGIOGRAM  10/22/2013   Procedure: LEFT HEART CATHETERIZATION WITH Beatrix Fetters;  Surgeon: Leonie Man, MD;  Location: [facility] CATH LAB;  Service: Cardiovascular;;   PANCREATIC STENT PLACEMENT  03/25/2019   Procedure: PANCREATIC STENT PLACEMENT;  Surgeon: Rush Landmark Telford Nab., MD;  Location: [place];  Service: Gastroenterology;;   PERCUTANEOUS CORONARY STENT INTERVENTION (PCI-S) N/A 03/07/2012   Procedure: PERCUTANEOUS CORONARY STENT INTERVENTION (PCI-S);  Surgeon: Sherren Mocha, MD;  Location: [facility] CATH LAB;  Service: Cardiovascular;  Laterality: N/A;   PERCUTANEOUS CORONARY STENT INTERVENTION (PCI-S) Right 10/23/2013   Procedure: PERCUTANEOUS CORONARY STENT INTERVENTION (PCI-S);  Surgeon: Blane Ohara, MD;  Location: [facility] [place] CATH LAB;  Service: Cardiovascular;  Laterality: Right;   POLYPECTOMY  07/06/2018   Procedure: POLYPECTOMY;  Surgeon: Rogene Houston, MD;  [facility];  Service: Endoscopy;;  polyp at ampulla   POLYPECTOMY  03/25/2019   Procedure: AMPULLECTOMY;  Surgeon: Rush Landmark Telford Nab., MD;  Location: [place];  Service: Gastroenterology;;   Right hand surgery     RIGHT HEART CATH AND CORONARY/GRAFT ANGIOGRAPHY N/A 12/16/2019   Procedure: RIGHT HEART CATH AND CORONARY/GRAFT ANGIOGRAPHY;  Surgeon: Nelva Bush, MD;  Location: [place] CV LAB;  Service: Cardiovascular;  Laterality: N/A;   SPHINCTEROTOMY  03/25/2019   Procedure: SPHINCTEROTOMY;  Surgeon: Mansouraty, Telford Nab., MD;  Location: [place];  Service: Gastroenterology;;   TIBIA IM NAIL INSERTION Right 04/07/2016   Procedure: FIXATION OF RIGHT TIBIA FRACTURE;  Surgeon: Garald Balding, MD;  Location: [place];  Service: Orthopedics;  Laterality: Right;  No Known Allergies    Family History  Problem Relation Age of Onset   Coronary artery disease Other    Colon cancer Neg Hx    Esophageal cancer Neg Hx    Inflammatory bowel disease Neg Hx    Liver disease Neg Hx    Pancreatic cancer Neg Hx    Rectal cancer Neg Hx    Stomach cancer Neg Hx      Social History Mr. Wilkin reports that he has been smoking cigarettes. He started smoking about 56 years ago. He has a 54.00 pack-year smoking history. He has never used smokeless tobacco. Mr. Walles reports current alcohol use.   Review of Systems CONSTITUTIONAL: No weight loss, fever, chills, weakness or fatigue.  HEENT: Eyes: No visual loss, blurred vision, double vision or yellow sclerae.No hearing loss, sneezing, congestion, runny nose or sore throat.  SKIN: No rash or itching.  CARDIOVASCULAR: per hpi RESPIRATORY: No shortness of breath, cough or sputum.  GASTROINTESTINAL: No anorexia, nausea, vomiting or diarrhea. No abdominal pain or blood.  GENITOURINARY: No burning on urination, no polyuria NEUROLOGICAL: No headache, dizziness, syncope, paralysis, ataxia, numbness or tingling in the extremities. No change in bowel or bladder control.  MUSCULOSKELETAL: No muscle, back pain, joint pain or stiffness.  LYMPHATICS: No enlarged nodes. No history of splenectomy.  PSYCHIATRIC: No history of depression or anxiety.  ENDOCRINOLOGIC: No reports of sweating, cold or heat intolerance. No polyuria or polydipsia.  Marland Kitchen   Physical Examination Today's Vitals   02/02/21  1045  BP: (!) 148/70  Pulse: 68  SpO2: 94%  Weight: 226 lb 9.6 oz (102.8 kg)  Height: 6' (1.829 m)   Body mass index is 30.73 kg/m.  Gen: resting comfortably, no acute distress HEENT: no scleral icterus, pupils equal round and reactive, no palptable cervical adenopathy,  CV: RRR, no m/r/g no jvd Resp: Clear to auscultation bilaterally GI: abdomen is soft, non-tender, non-distended, normal bowel sounds, no hepatosplenomegaly MSK: extremities are warm, no edema.  Skin: warm, no rash Neuro:  no focal deficits Psych: appropriate affect   Diagnostic Studies  02/2015 echo Study Conclusions   - Left ventricle: The cavity size was normal. There was mild   concentric hypertrophy. Systolic function was normal. The   estimated ejection fraction was in the range of 55% to 60%.   Possible hypokinesis of the basalinferior myocardium. Marked   bradycardia limits evaluation of LV diastolic function. - Aortic valve: There was trivial regurgitation. - Left atrium: The atrium was moderately dilated. - Right ventricle: Systolic function was mildly reduced.     02/2015 nuclear stress There was no ST segment deviation noted during stress. T wave inversion of 1 mm was noted during stress in the I, II, aVF, V5 and V6 leads. T wave inversion persisted. Defect 1: There is a medium defect of severe severity present in the apical anterior, apical inferior and apex location. Findings consistent with prior myocardial infarction. There is no evidence of ischemia. This is an intermediate risk study. Nuclear stress EF: 39%.   Assessment and Plan   1. CAD - no significant symptoms, continue current meds   2. LE edema - doing well, continue current meds   3.LV thrombus - 07/2020 echo show resolution, but apical sluggish flow, high risk of recurrence would continue coumadin      Arnoldo Lenis, M.D.

## 2021-02-02 NOTE — Patient Instructions (Signed)
Medication Instructions:  Continue all current medications.   Labwork: none  Testing/Procedures: none  Follow-Up: 6 months   Any Other Special Instructions Will Be Listed Below (If Applicable).   If you need a refill on your cardiac medications before your next appointment, please call your pharmacy.  

## 2021-02-17 DIAGNOSIS — J441 Chronic obstructive pulmonary disease with (acute) exacerbation: Secondary | ICD-10-CM | POA: Diagnosis not present

## 2021-02-17 DIAGNOSIS — I10 Essential (primary) hypertension: Secondary | ICD-10-CM | POA: Diagnosis not present

## 2021-02-17 DIAGNOSIS — Z720 Tobacco use: Secondary | ICD-10-CM | POA: Diagnosis not present

## 2021-02-18 ENCOUNTER — Ambulatory Visit (INDEPENDENT_AMBULATORY_CARE_PROVIDER_SITE_OTHER): Payer: Medicare HMO | Admitting: *Deleted

## 2021-02-18 ENCOUNTER — Other Ambulatory Visit: Payer: Self-pay

## 2021-02-18 DIAGNOSIS — I513 Intracardiac thrombosis, not elsewhere classified: Secondary | ICD-10-CM

## 2021-02-18 DIAGNOSIS — Z5181 Encounter for therapeutic drug level monitoring: Secondary | ICD-10-CM

## 2021-02-18 LAB — POCT INR: INR: 1.8 — AB (ref 2.0–3.0)

## 2021-02-18 NOTE — Patient Instructions (Signed)
Take warfarin 1 tablet tonight then resume 1 tablet daily except 1/2 tablet on Tuesdays, Thursdays and Saturdays Pending ERCP on 9/19  Told by MD to take last dose of Warfarin on 9/13.  Resume warfarin night of procedure.  Take extra 1/2 tablet x 2 days then continue above schedule. Recheck 7-10 days after procedure.

## 2021-02-23 ENCOUNTER — Telehealth: Payer: Self-pay | Admitting: *Deleted

## 2021-02-23 DIAGNOSIS — Z955 Presence of coronary angioplasty implant and graft: Secondary | ICD-10-CM | POA: Diagnosis not present

## 2021-02-23 DIAGNOSIS — I10 Essential (primary) hypertension: Secondary | ICD-10-CM | POA: Diagnosis not present

## 2021-02-23 DIAGNOSIS — J449 Chronic obstructive pulmonary disease, unspecified: Secondary | ICD-10-CM | POA: Diagnosis not present

## 2021-02-23 DIAGNOSIS — J9621 Acute and chronic respiratory failure with hypoxia: Secondary | ICD-10-CM | POA: Diagnosis not present

## 2021-02-23 DIAGNOSIS — U071 COVID-19: Secondary | ICD-10-CM | POA: Diagnosis not present

## 2021-02-23 NOTE — Telephone Encounter (Signed)
Please call Clifford Jones in regards to re-scheduling his upcoming appointment.

## 2021-02-23 NOTE — Telephone Encounter (Signed)
Pt is going to be out of town the week of scheduled appt.  Appt rescheduled.

## 2021-03-01 ENCOUNTER — Other Ambulatory Visit: Payer: Self-pay

## 2021-03-02 ENCOUNTER — Other Ambulatory Visit: Payer: Self-pay | Admitting: Family Medicine

## 2021-03-07 MED ORDER — SODIUM CHLORIDE 0.9 % IV SOLN
1.5000 g | Freq: Once | INTRAVENOUS | Status: AC
  Administered 2021-03-08: 1.5 g via INTRAVENOUS
  Filled 2021-03-07: qty 1.5

## 2021-03-07 NOTE — Anesthesia Preprocedure Evaluation (Addendum)
Anesthesia Evaluation  Patient identified by MRN, date of birth, ID band Patient awake    Reviewed: Allergy & Precautions, NPO status , Patient's Chart, lab work & pertinent test results, reviewed documented beta blocker date and time   Airway Mallampati: II  TM Distance: >3 FB Neck ROM: Full    Dental  (+) Dental Advisory Given, Edentulous Lower, Edentulous Upper   Pulmonary COPD,  COPD inhaler, Patient abstained from smoking., former smoker,    Pulmonary exam normal breath sounds clear to auscultation       Cardiovascular hypertension, Pt. on home beta blockers and Pt. on medications + CAD, + Past MI, + Cardiac Stents, + CABG and + Peripheral Vascular Disease (s/p AAA repair)  Normal cardiovascular exam+ pacemaker  Rhythm:Regular Rate:Normal     Neuro/Psych PSYCHIATRIC DISORDERS Anxiety negative neurological ROS     GI/Hepatic Neg liver ROS, GERD  Medicated,Ampullary Adenoma, HX of ERCP   Endo/Other  diabetes, Type 2, Oral Hypoglycemic Agents  Renal/GU negative Renal ROS     Musculoskeletal  (+) Arthritis ,   Abdominal   Peds  Hematology  (+) Blood dyscrasia (Warfarin), ,   Anesthesia Other Findings   Reproductive/Obstetrics                            Anesthesia Physical Anesthesia Plan  ASA: 3  Anesthesia Plan: General   Post-op Pain Management:    Induction: Intravenous  PONV Risk Score and Plan: 2 and Propofol infusion, Dexamethasone and Ondansetron  Airway Management Planned: Oral ETT  Additional Equipment:   Intra-op Plan:   Post-operative Plan: Extubation in OR  Informed Consent: I have reviewed the patients History and Physical, chart, labs and discussed the procedure including the risks, benefits and alternatives for the proposed anesthesia with the patient or authorized representative who has indicated his/her understanding and acceptance.     Dental advisory  given  Plan Discussed with: CRNA  Anesthesia Plan Comments:        Anesthesia Quick Evaluation

## 2021-03-08 ENCOUNTER — Ambulatory Visit (HOSPITAL_COMMUNITY)
Admission: RE | Admit: 2021-03-08 | Discharge: 2021-03-08 | Disposition: A | Discharge status: Home / Self Care | Payer: Medicare HMO | Attending: Gastroenterology | Admitting: Gastroenterology

## 2021-03-08 ENCOUNTER — Encounter (HOSPITAL_COMMUNITY): Payer: Self-pay | Admitting: Gastroenterology

## 2021-03-08 ENCOUNTER — Ambulatory Visit (HOSPITAL_COMMUNITY): Payer: Medicare HMO | Admitting: Anesthesiology

## 2021-03-08 ENCOUNTER — Other Ambulatory Visit: Payer: Self-pay

## 2021-03-08 ENCOUNTER — Ambulatory Visit (HOSPITAL_COMMUNITY): Payer: Medicare HMO

## 2021-03-08 ENCOUNTER — Encounter (HOSPITAL_COMMUNITY)
Admission: RE | Disposition: A | Discharge status: Home / Self Care | Payer: Self-pay | Source: Home / Self Care | Attending: Gastroenterology

## 2021-03-08 DIAGNOSIS — D135 Benign neoplasm of extrahepatic bile ducts: Secondary | ICD-10-CM | POA: Insufficient documentation

## 2021-03-08 DIAGNOSIS — Z7984 Long term (current) use of oral hypoglycemic drugs: Secondary | ICD-10-CM | POA: Diagnosis not present

## 2021-03-08 DIAGNOSIS — K449 Diaphragmatic hernia without obstruction or gangrene: Secondary | ICD-10-CM | POA: Insufficient documentation

## 2021-03-08 DIAGNOSIS — Z794 Long term (current) use of insulin: Secondary | ICD-10-CM | POA: Insufficient documentation

## 2021-03-08 DIAGNOSIS — Z950 Presence of cardiac pacemaker: Secondary | ICD-10-CM | POA: Diagnosis not present

## 2021-03-08 DIAGNOSIS — Z8249 Family history of ischemic heart disease and other diseases of the circulatory system: Secondary | ICD-10-CM | POA: Insufficient documentation

## 2021-03-08 DIAGNOSIS — Z9889 Other specified postprocedural states: Secondary | ICD-10-CM

## 2021-03-08 DIAGNOSIS — Z955 Presence of coronary angioplasty implant and graft: Secondary | ICD-10-CM | POA: Diagnosis not present

## 2021-03-08 DIAGNOSIS — Z87891 Personal history of nicotine dependence: Secondary | ICD-10-CM | POA: Insufficient documentation

## 2021-03-08 DIAGNOSIS — Z951 Presence of aortocoronary bypass graft: Secondary | ICD-10-CM | POA: Diagnosis not present

## 2021-03-08 DIAGNOSIS — E785 Hyperlipidemia, unspecified: Secondary | ICD-10-CM | POA: Diagnosis not present

## 2021-03-08 DIAGNOSIS — E1151 Type 2 diabetes mellitus with diabetic peripheral angiopathy without gangrene: Secondary | ICD-10-CM | POA: Insufficient documentation

## 2021-03-08 DIAGNOSIS — Z09 Encounter for follow-up examination after completed treatment for conditions other than malignant neoplasm: Secondary | ICD-10-CM | POA: Insufficient documentation

## 2021-03-08 DIAGNOSIS — K219 Gastro-esophageal reflux disease without esophagitis: Secondary | ICD-10-CM | POA: Diagnosis not present

## 2021-03-08 DIAGNOSIS — C241 Malignant neoplasm of ampulla of Vater: Secondary | ICD-10-CM | POA: Diagnosis not present

## 2021-03-08 HISTORY — PX: PANCREATIC STENT PLACEMENT: SHX5539

## 2021-03-08 HISTORY — PX: REMOVAL OF STONES: SHX5545

## 2021-03-08 HISTORY — PX: ENDOSCOPIC RETROGRADE CHOLANGIOPANCREATOGRAPHY (ERCP) WITH PROPOFOL: SHX5810

## 2021-03-08 HISTORY — PX: BIOPSY: SHX5522

## 2021-03-08 LAB — GLUCOSE, CAPILLARY: Glucose-Capillary: 133 mg/dL — ABNORMAL HIGH (ref 70–99)

## 2021-03-08 SURGERY — ENDOSCOPIC RETROGRADE CHOLANGIOPANCREATOGRAPHY (ERCP) WITH PROPOFOL
Anesthesia: General

## 2021-03-08 MED ORDER — HYDRALAZINE HCL 20 MG/ML IJ SOLN
INTRAMUSCULAR | Status: AC
  Filled 2021-03-08: qty 1

## 2021-03-08 MED ORDER — LACTATED RINGERS IV SOLN
INTRAVENOUS | Status: DC
  Administered 2021-03-08: 1000 mL via INTRAVENOUS

## 2021-03-08 MED ORDER — FENTANYL CITRATE (PF) 100 MCG/2ML IJ SOLN
INTRAMUSCULAR | Status: DC | PRN
  Administered 2021-03-08: 50 ug via INTRAVENOUS

## 2021-03-08 MED ORDER — FENTANYL CITRATE (PF) 100 MCG/2ML IJ SOLN
INTRAMUSCULAR | Status: AC
  Filled 2021-03-08: qty 2

## 2021-03-08 MED ORDER — DEXAMETHASONE SODIUM PHOSPHATE 10 MG/ML IJ SOLN
INTRAMUSCULAR | Status: DC | PRN
  Administered 2021-03-08: 10 mg via INTRAVENOUS

## 2021-03-08 MED ORDER — GLUCAGON HCL RDNA (DIAGNOSTIC) 1 MG IJ SOLR
INTRAMUSCULAR | Status: DC | PRN
  Administered 2021-03-08 (×2): .25 mg via INTRAVENOUS

## 2021-03-08 MED ORDER — LIDOCAINE 2% (20 MG/ML) 5 ML SYRINGE
INTRAMUSCULAR | Status: DC | PRN
  Administered 2021-03-08: 80 mg via INTRAVENOUS

## 2021-03-08 MED ORDER — FENTANYL CITRATE (PF) 100 MCG/2ML IJ SOLN: 25.0000 ug | INTRAMUSCULAR | Status: DC | PRN

## 2021-03-08 MED ORDER — MIDAZOLAM HCL 2 MG/2ML IJ SOLN
INTRAMUSCULAR | Status: AC
  Filled 2021-03-08: qty 2

## 2021-03-08 MED ORDER — PROPOFOL 10 MG/ML IV BOLUS
INTRAVENOUS | Status: AC
  Filled 2021-03-08: qty 20

## 2021-03-08 MED ORDER — GLUCAGON HCL RDNA (DIAGNOSTIC) 1 MG IJ SOLR
INTRAMUSCULAR | Status: AC
  Filled 2021-03-08: qty 1

## 2021-03-08 MED ORDER — ROCURONIUM BROMIDE 10 MG/ML (PF) SYRINGE
PREFILLED_SYRINGE | INTRAVENOUS | Status: DC | PRN
  Administered 2021-03-08: 50 mg via INTRAVENOUS

## 2021-03-08 MED ORDER — PROPOFOL 10 MG/ML IV BOLUS
INTRAVENOUS | Status: DC | PRN
  Administered 2021-03-08: 90 mg via INTRAVENOUS

## 2021-03-08 MED ORDER — INDOMETHACIN 50 MG RE SUPP
RECTAL | Status: AC
  Filled 2021-03-08: qty 2

## 2021-03-08 MED ORDER — METOPROLOL TARTRATE 5 MG/5ML IV SOLN
INTRAVENOUS | Status: DC | PRN
  Administered 2021-03-08 (×3): 1 mg via INTRAVENOUS

## 2021-03-08 MED ORDER — SUGAMMADEX SODIUM 200 MG/2ML IV SOLN
INTRAVENOUS | Status: DC | PRN
  Administered 2021-03-08: 200 mg via INTRAVENOUS

## 2021-03-08 MED ORDER — SODIUM CHLORIDE 0.9 % IV SOLN
INTRAVENOUS | Status: DC | PRN
  Administered 2021-03-08: 20 mL

## 2021-03-08 MED ORDER — INDOMETHACIN 50 MG RE SUPP
RECTAL | Status: DC | PRN
  Administered 2021-03-08: 100 mg via RECTAL

## 2021-03-08 MED ORDER — MIDAZOLAM HCL 5 MG/5ML IJ SOLN
INTRAMUSCULAR | Status: DC | PRN
  Administered 2021-03-08: 2 mg via INTRAVENOUS

## 2021-03-08 MED ORDER — ONDANSETRON HCL 4 MG/2ML IJ SOLN
INTRAMUSCULAR | Status: DC | PRN
  Administered 2021-03-08: 4 mg via INTRAVENOUS

## 2021-03-08 MED ORDER — SODIUM CHLORIDE 0.9 % IV SOLN: INTRAVENOUS | Status: DC

## 2021-03-08 MED ORDER — HYDRALAZINE HCL 20 MG/ML IJ SOLN
10.0000 mg | Freq: Once | INTRAMUSCULAR | Status: AC
  Administered 2021-03-08: 10 mg via INTRAVENOUS

## 2021-03-08 MED ORDER — PHENYLEPHRINE 40 MCG/ML (10ML) SYRINGE FOR IV PUSH (FOR BLOOD PRESSURE SUPPORT)
PREFILLED_SYRINGE | INTRAVENOUS | Status: DC | PRN
  Administered 2021-03-08: 80 ug via INTRAVENOUS
  Administered 2021-03-08: 120 ug via INTRAVENOUS
  Administered 2021-03-08: 80 ug via INTRAVENOUS

## 2021-03-08 NOTE — Transfer of Care (Signed)
Immediate Anesthesia Transfer of Care Note  Patient: Clifford Jones  Procedure(s) Performed: ENDOSCOPIC RETROGRADE CHOLANGIOPANCREATOGRAPHY (ERCP) WITH PROPOFOL BIOPSY PANCREATIC STENT PLACEMENT REMOVAL OF STONES  Patient Location: PACU  Anesthesia Type:General  Level of Consciousness: awake, alert  and oriented  Airway & Oxygen Therapy: Patient Spontanous Breathing and Patient connected to face mask oxygen  Post-op Assessment: Report given to RN and Post -op Vital signs reviewed and stable  Post vital signs: Reviewed and stable  Last Vitals:  Vitals Value Taken Time  BP 221/92 03/08/21 0850  Temp    Pulse 66 03/08/21 0851  Resp 18 03/08/21 0851  SpO2 100 % 03/08/21 0851  Vitals shown include unvalidated device data.  Last Pain:  Vitals:   03/08/21 0850  TempSrc:   PainSc: Asleep         Complications: No notable events documented.

## 2021-03-08 NOTE — H&P (Signed)
GASTROENTEROLOGY PROCEDURE H&P NOTE   Primary Care Physician: Lavella Lemons, PA  HPI: Clifford Jones is a 70 y.o. male who presents for ERCP for follow up of previous ampullectomy.  Past Medical History:  Diagnosis Date   Ampullary adenoma    Arthritis    Reported by patient   Cancer [facility])    Reported by patient   Claustrophobia    Closed fracture of right distal tibia    COPD (chronic obstructive pulmonary disease) ([place])    Reported by patient   Coronary artery disease    a. 1999 Inf MI;  b. 2003 CABGx6;  c. 2008 NSTEMI, occluded SVG to marginal and diagonal system. 2 DES placed to marginal system;  c. NSTEMI - 02/2012 Xience Xpedition DES to oS-OM and POBA to mid lesion;  c. 10/2013 NSTEMI/PCI: VG->OM 80 ISR, native OM 80/90 small, LIMA->LAD ok w/ 90 dLAD, D1 80, RIMA->RPL ok w/ 95 in RPL (2.5x14 Resolute DES), nl EF.   Dyspnea    Reported by patient   GERD (gastroesophageal reflux disease)    Hyperlipidemia    Hypertension    Myocardial infarction ([place])    X 6   Pneumonia    Presence of permanent cardiac pacemaker    S/P AAA repair    Tobacco abuse    Type 2 diabetes mellitus without complication, without long-term current use of insulin ([place])    Wears dentures    Wears glasses    Past Surgical History:  Procedure Laterality Date   ABDOMINAL AORTIC ANEURYSM REPAIR  2006   APPENDECTOMY     CORONARY ANGIOPLASTY WITH STENT PLACEMENT     CORONARY ARTERY BYPASS GRAFT  2003   ENDOSCOPIC RETROGRADE CHOLANGIOPANCREATOGRAPHY (ERCP) WITH PROPOFOL N/A 03/25/2019   Procedure: ENDOSCOPIC RETROGRADE CHOLANGIOPANCREATOGRAPHY (ERCP) WITH PROPOFOL;  Surgeon: Irving Copas., MD;  Location: [facility] ENDOSCOPY;  Service: Gastroenterology;  Laterality: N/A;   ESOPHAGEAL DILATION  07/06/2018   Procedure: ESOPHAGEAL DILATION;  Surgeon: Rogene Houston, MD;  [facility]O [facility];  Service: Endoscopy;;   ESOPHAGOGASTRODUODENOSCOPY N/A 07/06/2018   Procedure:  ESOPHAGOGASTRODUODENOSCOPY (EGD);  Surgeon: Rogene Houston, MD;  [facility];  Service: Endoscopy;  Laterality: N/A;   ESOPHAGOGASTRODUODENOSCOPY N/A 08/16/2018   Procedure: ESOPHAGOGASTRODUODENOSCOPY (EGD);  Surgeon: Milus Banister, MD;  Location: [facility] ENDOSCOPY;  Service: Endoscopy;  Laterality: N/A;   ESOPHAGOGASTRODUODENOSCOPY (EGD) WITH PROPOFOL N/A 03/25/2019   Procedure: ESOPHAGOGASTRODUODENOSCOPY (EGD) WITH PROPOFOL;  Surgeon: Rush Landmark Telford Nab., MD;  Location: [place];  Service: Gastroenterology;  Laterality: N/A;   EUS N/A 08/16/2018   Procedure: UPPER ENDOSCOPIC ULTRASOUND (EUS) RADIAL;  Surgeon: Milus Banister, MD;  Location: [facility] ENDOSCOPY;  Service: Endoscopy;  Laterality: N/A;   EUS N/A 03/25/2019   Procedure: UPPER ENDOSCOPIC ULTRASOUND (EUS) RADIAL;  Surgeon: Irving Copas., MD;  Location: [place];  Service: Gastroenterology;  Laterality: N/A;   FOREIGN BODY REMOVAL  07/06/2018   Procedure: FOREIGN BODY REMOVAL;  Surgeon: Rogene Houston, MD;  [facility];  Service: Endoscopy;;   HERNIA REPAIR     umbicical   LEFT HEART CATHETERIZATION WITH CORONARY ANGIOGRAM N/A 03/05/2012   Procedure: LEFT HEART CATHETERIZATION WITH CORONARY ANGIOGRAM;  Surgeon: Hillary Bow, MD;  Location: [facility] CATH LAB;  Service: Cardiovascular;  Laterality: N/A;   LEFT HEART CATHETERIZATION WITH CORONARY ANGIOGRAM N/A 10/23/2013   Procedure: LEFT HEART CATHETERIZATION WITH CORONARY ANGIOGRAM;  Surgeon: Blane Ohara, MD;  Location: [facility] CATH LAB;  Service: Cardiovascular;  Laterality: N/A;   LEFT HEART CATHETERIZATION WITH CORONARY/GRAFT ANGIOGRAM  10/22/2013   Procedure: LEFT HEART CATHETERIZATION WITH Beatrix Fetters;  Surgeon: Leonie Man, MD;  Location: [facility] CATH LAB;  Service: Cardiovascular;;   PANCREATIC STENT PLACEMENT  03/25/2019   Procedure: PANCREATIC STENT PLACEMENT;  Surgeon: Rush Landmark Telford Nab., MD;  Location: [place];  Service:  Gastroenterology;;   PERCUTANEOUS CORONARY STENT INTERVENTION (PCI-S) N/A 03/07/2012   Procedure: PERCUTANEOUS CORONARY STENT INTERVENTION (PCI-S);  Surgeon: Sherren Mocha, MD;  Location: [facility] CATH LAB;  Service: Cardiovascular;  Laterality: N/A;   PERCUTANEOUS CORONARY STENT INTERVENTION (PCI-S) Right 10/23/2013   Procedure: PERCUTANEOUS CORONARY STENT INTERVENTION (PCI-S);  Surgeon: Blane Ohara, MD;  Location: [facility] CATH LAB;  Service: Cardiovascular;  Laterality: Right;   POLYPECTOMY  07/06/2018   Procedure: POLYPECTOMY;  Surgeon: Rogene Houston, MD;  [facility];  Service: Endoscopy;;  polyp at ampulla   POLYPECTOMY  03/25/2019   Procedure: AMPULLECTOMY;  Surgeon: Rush Landmark Telford Nab., MD;  Location: [place];  Service: Gastroenterology;;   Right hand surgery     RIGHT HEART CATH AND CORONARY/GRAFT ANGIOGRAPHY N/A 12/16/2019   Procedure: RIGHT HEART CATH AND CORONARY/GRAFT ANGIOGRAPHY;  Surgeon: Nelva Bush, MD;  Location: [place] CV LAB;  Service: Cardiovascular;  Laterality: N/A;   SPHINCTEROTOMY  03/25/2019   Procedure: SPHINCTEROTOMY;  Surgeon: Mansouraty, Telford Nab., MD;  Location: [place];  Service: Gastroenterology;;   TIBIA IM NAIL INSERTION Right 04/07/2016   Procedure: FIXATION OF RIGHT TIBIA FRACTURE;  Surgeon: Garald Balding, MD;  Location: [place];  Service: Orthopedics;  Laterality: Right;   Current Facility-Administered Medications  Medication Dose Route Frequency Provider Last Rate Last Admin   0.9 %  sodium chloride infusion   Intravenous Continuous Mansouraty, Telford Nab., MD       ampicillin-sulbactam (UNASYN) 1.5 g in sodium chloride 0.9 % 100 mL IVPB  1.5 g Intravenous Once Dimple Nanas, RPH       lactated ringers infusion   Intravenous Continuous Mansouraty, Telford Nab., MD 10 mL/hr at 03/08/21 0712 1,000 mL at 03/08/21 0712    Current Facility-Administered Medications:    0.9 %  sodium chloride infusion, , Intravenous,  Continuous, Mansouraty, Telford Nab., MD   ampicillin-sulbactam (UNASYN) 1.5 g in sodium chloride 0.9 % 100 mL IVPB, 1.5 g, Intravenous, Once, Dimple Nanas, RPH   lactated ringers infusion, , Intravenous, Continuous, Mansouraty, Telford Nab., MD, Last Rate: 10 mL/hr at 03/08/21 0712, 1,000 mL at 03/08/21 0712 No Known Allergies Family History  Problem Relation Age of Onset   Coronary artery disease Other    Colon cancer Neg Hx    Esophageal cancer Neg Hx    Inflammatory bowel disease Neg Hx    Liver disease Neg Hx    Pancreatic cancer Neg Hx    Rectal cancer Neg Hx    Stomach cancer Neg Hx    Social History   Socioeconomic History   Marital status: Legally Separated    Spouse name: Not on file   Number of children: Not on file   Years of education: Not on file   Highest education level: Not on file  Occupational History   Not on file  Tobacco Use   Smoking status: Former    Packs/day: 1.00    Years: 54.00    Pack years: 54.00    Types: Cigarettes    Start date: 10/04/1964    Quit date: 10/2020    Years since quitting: 0.3   Smokeless  tobacco: Never   Tobacco comments:    since November 2020-has smoked a total of 6 packs  Vaping Use   Vaping Use: Never used  Substance and Sexual Activity   Alcohol use: Yes    Comment: Maybe 2 beers a month and 2 shotsa month   Drug use: No   Sexual activity: Yes  Other Topics Concern   Not on file  Social History Narrative   Disabled.  Former truck Software engineer. Divorced and remarried x 2         Social Determinants of Health   Financial Resource Strain: Not on file  Food Insecurity: Not on file  Transportation Needs: Not on file  Physical Activity: Not on file  Stress: Not on file  Social Connections: Not on file  Intimate Partner Violence: Not on file    Physical Exam: Vital signs in last 24 hours: Temp:  [98.2 F (36.8 C)] 98.2 F (36.8 C) (09/19 0639) Resp:  [19] 19 (09/19 0639) BP: (195)/(95)  195/95 (09/19 0639) SpO2:  [95 %] 95 % (09/19 0639) Weight:  [97.5 kg] 97.5 kg (09/19 0639)   GEN: NAD EYE: Sclerae anicteric ENT: MMM CV: Non-tachycardic GI: Soft, NT/ND NEURO:  Alert & Oriented x 3  Lab Results: No results for input(s): WBC, HGB, HCT, PLT in the last 72 hours. BMET No results for input(s): NA, K, CL, CO2, GLUCOSE, BUN, CREATININE, CALCIUM in the last 72 hours. LFT No results for input(s): PROT, ALBUMIN, AST, ALT, ALKPHOS, BILITOT, BILIDIR, IBILI in the last 72 hours. PT/INR No results for input(s): LABPROT, INR in the last 72 hours.   Impression / Plan: This is a 70 y.o.male who presents for ERCP for follow up of previous ampullectomy.  The risks and benefits of endoscopic evaluation/treatment were discussed with the patient and/or family; these include but are not limited to the risk of perforation, infection, bleeding, missed lesions, lack of diagnosis, severe illness requiring hospitalization, as well as anesthesia and sedation related illnesses.  The patient's history has been reviewed, patient examined, no change in status, and deemed stable for procedure.  The patient and/or family is agreeable to proceed.    Justice Britain, MD [facility] Office # [phone]

## 2021-03-08 NOTE — Anesthesia Postprocedure Evaluation (Signed)
Anesthesia Post Note  Patient: Clifford Jones  Procedure(s) Performed: ENDOSCOPIC RETROGRADE CHOLANGIOPANCREATOGRAPHY (ERCP) WITH PROPOFOL BIOPSY PANCREATIC STENT PLACEMENT REMOVAL OF STONES     Patient location during evaluation: Endoscopy Anesthesia Type: General Level of consciousness: awake and alert Pain management: pain level controlled Vital Signs Assessment: post-procedure vital signs reviewed and stable Respiratory status: spontaneous breathing, nonlabored ventilation, respiratory function stable and patient connected to nasal cannula oxygen Cardiovascular status: blood pressure returned to baseline and stable Postop Assessment: no apparent nausea or vomiting Anesthetic complications: no   No notable events documented.  Last Vitals:  Vitals:   03/08/21 0910 03/08/21 0920  BP: (!) 142/73 (!) 165/68  Pulse: 69 71  Resp: 14 13  Temp:    SpO2: 93% 92%    Last Pain:  Vitals:   03/08/21 0920  TempSrc:   PainSc: 0-No pain                 Catalina Gravel

## 2021-03-08 NOTE — Anesthesia Procedure Notes (Signed)
Procedure Name: Intubation Date/Time: 03/08/2021 7:54 AM Performed by: Vasti Yagi D, CRNA Pre-anesthesia Checklist: Patient identified, Emergency Drugs available, Suction available and Patient being monitored Patient Re-evaluated:Patient Re-evaluated prior to induction Oxygen Delivery Method: Circle system utilized Preoxygenation: Pre-oxygenation with 100% oxygen Induction Type: IV induction Ventilation: Mask ventilation without difficulty Laryngoscope Size: Mac and 4 Grade View: Grade I Tube type: Oral Tube size: 7.5 mm Number of attempts: 1 Airway Equipment and Method: Stylet Placement Confirmation: ETT inserted through vocal cords under direct vision, positive ETCO2 and breath sounds checked- equal and bilateral Secured at: 22 cm Tube secured with: Tape Dental Injury: Teeth and Oropharynx as per pre-operative assessment

## 2021-03-08 NOTE — Op Note (Signed)
[facility] Patient Name: Clifford Jones Procedure Date: 03/08/2021 MRN: [id number] Attending MD: Justice Britain , MD Date of Birth: 1951-05-28 CSN: [id number] Age: 70 Admit Type: Outpatient Procedure:                ERCP Indications:              Postop exam: Endoscopic papillectomy, Prior                            endoscopic papillectomy Providers:                Justice Britain, MD, Burtis Junes, RN, Laverda Sorenson, Technician, [facility], CRNA Referring MD:             Hildred Laser, MD, Livingston Diones. Luciana Axe Medicines:                General Anesthesia, Cipro 400 mg IV, Indomethacin                            100 mg PR (given after sedation), Glucagon 0.5 mg IV Complications:            No immediate complications. Estimated Blood Loss:     Estimated blood loss: none. Procedure:                Pre-Anesthesia Assessment:                           - Prior to the procedure, a History and Physical                            was performed, and patient medications and                            allergies were reviewed. The patient's tolerance of                            previous anesthesia was also reviewed. The risks                            and benefits of the procedure and the sedation                            options and risks were discussed with the patient.                            All questions were answered, and informed consent                            was obtained. Prior Anticoagulants: The patient has                            taken Coumadin (warfarin), last dose was 5 days  prior to procedure. ASA Grade Assessment: III - A                            patient with severe systemic disease. After                            reviewing the risks and benefits, the patient was                            deemed in satisfactory condition to undergo the                            procedure.                            After obtaining informed consent, the scope was                            passed under direct vision. Throughout the                            procedure, the patient's blood pressure, pulse, and                            oxygen saturations were monitored continuously. The                            [employer] single use                            duodenoscope was introduced through the mouth, and                            used to inject contrast into and used to inject                            contrast into the bile duct and ventral pancreatic                            duct. The ERCP was accomplished without difficulty.                            The patient tolerated the procedure. Scope In: Scope Out: Findings:      The scout film was normal.      The esophagus was successfully intubated under direct vision without       detailed examination of the pharynx, larynx, and associated structures.       The scope was passed under direct vision through the upper GI tract. The       Z-line was regular and was found 39 cm from the incisors. A small hiatal       hernia was present. No gross lesions were noted in the entire examined       stomach. No gross lesions were noted in the duodenal bulb, in the first  portion of the duodenum and in the second portion of the duodenum.       Inspection of the major papilla revealed that an endoscopic ampullectomy       (papillectomy) had been performed previously. Inspection of the major       papilla revealed that biliary and pancreatic sphincterotomies had been       performed previously. The biliary sphincterotomy appeared open. The       pancreatic sphincterotomy appeared open. There was some very small       polypoid appearance to the region inferior of the pancreatic orifice       that could suggest recurrence.      A short 0.035 inch Soft Jagwire was passed into the biliary tree. The       Hydratome  sphincterotome was passed over the guidewire and the bile duct       was then deeply cannulated. Contrast was injected. I personally       interpreted the bile duct images. Ductal flow of contrast was adequate.       Image quality was adequate. Contrast extended to the hepatic ducts.       Opacification of the entire biliary tree except for the gallbladder was       successful. The maximum diameter of the ducts was 5 mm. To discover       objects, the biliary tree was swept with a retrieval balloon. Nothing       was found.      A short 0.035 inch Soft Jagwire was passed into the pancreatic tree. The       ventral pancreatic duct was deeply cannulated with the Hydratome       sphincterotome. Contrast was injected. I personally interpreted the       pancreatic duct images. Ductal flow of contrast was adequate. Image       quality was adequate. Contrast extended to the pancreatic duct. The main       pancreatic duct was normal.      Using hot avulsion, for tissue destruction in the region of possible       recurrence at the inferior portion of the pancreatic orifice through the       ERCP scope was successful.      A short 0.035 inch Soft Jagwire was passed into the ventral pancreatic       duct. One 4 Fr by 7 cm temporary plastic pancreatic stent with a single       external pigtail was placed into the ventral pancreatic duct. The stent       was in good position.      The duodenoscope was withdrawn from the patient. Impression:               - Z-line regular, 39 cm from the incisors.                           - Small hiatal hernia.                           - No gross lesions in the stomach.                           - No gross lesions in the duodenal bulb, in the  first portion of the duodenum and in the second                            portion of the duodenum.                           - Prior endoscopic papillectomy. Prior biliary                             sphincterotomy appeared open. Prior pancreatic                            sphincterotomy appeared open. Small area inferior                            to the pancreatic orifice that had potential                            polypoid recurrence.                           - Tissue destruction via hot avulsion performed of                            the area concerning for possible recurrence.                           - The biliary tree was swept and nothing was found.                           - The pancreatic duct was normal.                           - One temporary plastic pancreatic stent was placed                            into the ventral pancreatic duct to decrease PEP. Moderate Sedation:      Not Applicable - Patient had care per Anesthesia. Recommendation:           - The patient will be observed post-procedure,                            until all discharge criteria are met.                           - Discharge patient to home.                           - Patient has a contact number available for                            emergencies. The signs and symptoms of potential                            delayed complications were  discussed with the                            patient. Return to normal activities tomorrow.                            Written discharge instructions were provided to the                            patient.                           - Low fat diet for 2 weeks.                           - Observe patient's clinical course.                           - Await path results.                           - Watch for pancreatitis, bleeding, perforation,                            and cholangitis.                           - OK to resume Warfarin as per Cardiology                            recommendations to minimize risk of prolonged hold.                           - Patient will need a KUB 2-view in 10-14 days to                            ensure pancreatic stent  has migrated successfully.                            If still present at that time will need to be                            scheduled for EGD with stent pull.                           - Repeat ERCP for surveillance will be based on the                            pathology. If adenomatous recurrence is found then                            would repeat EGD/ERCP in 1-year. If no adenomatous                            recurrence is found then would repeat  EGD/ERCP in                            2-years.                           - The findings and recommendations were discussed                            with the patient.                           - The findings and recommendations were discussed                            with the designated responsible adult. Procedure Code(s):        --- Professional ---                           [phone], Endoscopic retrograde                            cholangiopancreatography (ERCP); with placement of                            endoscopic stent into biliary or pancreatic duct,                            including pre- and post-dilation and guide wire                            passage, when performed, including sphincterotomy,                            when performed, each stent                           [id number], Endoscopic retrograde                            cholangiopancreatography (ERCP); with ablation of                            tumor(s), polyp(s), or other lesion(s), including                            pre- and post-dilation and guide wire passage, when                            performed Diagnosis Code(s):        --- Professional ---                           K44.9, Diaphragmatic hernia without obstruction or                            gangrene  Z09, Encounter for follow-up examination after                            completed treatment for conditions other than                            malignant neoplasm                            Z98.890, Other specified postprocedural states CPT copyright 2019 American Medical Association. All rights reserved. The codes documented in this report are preliminary and upon coder review may  be revised to meet current compliance requirements. Justice Britain, MD 03/08/2021 8:45:55 AM Number of Addenda: 0

## 2021-03-08 NOTE — Discharge Instructions (Signed)
YOU HAD AN ENDOSCOPIC PROCEDURE TODAY: Refer to the procedure report and other information in the discharge instructions given to you for any specific questions about what was found during the examination. If this information does not answer your questions, please call [place] office at [phone] to clarify.   YOU SHOULD EXPECT: Some feelings of bloating in the abdomen. Passage of more gas than usual. Walking can help get rid of the air that was put into your GI tract during the procedure and reduce the bloating. If you had a lower endoscopy (such as a colonoscopy or flexible sigmoidoscopy) you may notice spotting of blood in your stool or on the toilet paper. Some abdominal soreness may be present for a day or two, also.  DIET: Your first meal following the procedure should be a light meal and then it is ok to progress to your normal diet. A half-sandwich or bowl of soup is an example of a good first meal. Heavy or fried foods are harder to digest and may make you feel nauseous or bloated. Drink plenty of fluids but you should avoid alcoholic beverages for 24 hours. If you had a esophageal dilation, please see attached instructions for diet.    ACTIVITY: Your care partner should take you home directly after the procedure. You should plan to take it easy, moving slowly for the rest of the day. You can resume normal activity the day after the procedure however YOU SHOULD NOT DRIVE, use power tools, machinery or perform tasks that involve climbing or major physical exertion for 24 hours (because of the sedation medicines used during the test).   SYMPTOMS TO REPORT IMMEDIATELY: A gastroenterologist can be reached at any hour. Please call [phone]  for any of the following symptoms:   Following upper endoscopy (EGD, EUS, ERCP, esophageal dilation) Vomiting of blood or coffee ground material  New, significant abdominal pain  New, significant chest pain or pain under the shoulder blades  Painful or  persistently difficult swallowing  New shortness of breath  Black, tarry-looking or red, bloody stools  FOLLOW UP:  If any biopsies were taken you will be contacted by phone or by letter within the next 1-3 weeks. Call [phone]  if you have not heard about the biopsies in 3 weeks.  Please also call with any specific questions about appointments or follow up tests.  

## 2021-03-09 ENCOUNTER — Encounter (HOSPITAL_COMMUNITY): Payer: Self-pay | Admitting: Gastroenterology

## 2021-03-09 ENCOUNTER — Other Ambulatory Visit: Payer: Self-pay

## 2021-03-09 ENCOUNTER — Encounter: Payer: Self-pay | Admitting: Gastroenterology

## 2021-03-09 DIAGNOSIS — Z9689 Presence of other specified functional implants: Secondary | ICD-10-CM

## 2021-03-09 LAB — SURGICAL PATHOLOGY

## 2021-03-09 NOTE — Progress Notes (Signed)
Staff message sent to Koren Shiver, RN for 1 year ERCP follow-up

## 2021-03-22 ENCOUNTER — Ambulatory Visit (INDEPENDENT_AMBULATORY_CARE_PROVIDER_SITE_OTHER): Payer: Medicare HMO | Admitting: *Deleted

## 2021-03-22 ENCOUNTER — Other Ambulatory Visit (HOSPITAL_COMMUNITY)
Admission: RE | Admit: 2021-03-22 | Discharge: 2021-03-22 | Disposition: A | Discharge status: Home / Self Care | Payer: Medicare HMO | Source: Ambulatory Visit | Attending: Cardiology | Admitting: Cardiology

## 2021-03-22 ENCOUNTER — Other Ambulatory Visit: Payer: Self-pay

## 2021-03-22 DIAGNOSIS — I513 Intracardiac thrombosis, not elsewhere classified: Secondary | ICD-10-CM | POA: Insufficient documentation

## 2021-03-22 DIAGNOSIS — Z5181 Encounter for therapeutic drug level monitoring: Secondary | ICD-10-CM

## 2021-03-22 LAB — PROTIME-INR
INR: 4 — ABNORMAL HIGH (ref 0.8–1.2)
Prothrombin Time: 39 seconds — ABNORMAL HIGH (ref 11.4–15.2)

## 2021-03-22 LAB — POCT INR: INR: 6.9 — AB (ref 2.0–3.0)

## 2021-03-23 ENCOUNTER — Ambulatory Visit (INDEPENDENT_AMBULATORY_CARE_PROVIDER_SITE_OTHER)
Admission: RE | Admit: 2021-03-23 | Discharge: 2021-03-23 | Disposition: A | Discharge status: Home / Self Care | Payer: Medicare HMO | Source: Ambulatory Visit | Attending: Gastroenterology | Admitting: Gastroenterology

## 2021-03-23 DIAGNOSIS — Z9689 Presence of other specified functional implants: Secondary | ICD-10-CM | POA: Diagnosis not present

## 2021-03-23 NOTE — Patient Instructions (Signed)
POC INR 6.9  Sent to [facility] lab for STAT PT/INR  INR 4.0 Pt told to hold warfarin tonight and Tuesday night then resume 1 tablet daily except 1/2 tablet on Tuesdays, Thursdays and Saturdays. Pt denies any S/S of bleeding.  Bleeding and fall precautions discussed with pt and he verbalized understanding.  Confirmed phone numbers and will call pt when results are back.

## 2021-03-30 ENCOUNTER — Ambulatory Visit (INDEPENDENT_AMBULATORY_CARE_PROVIDER_SITE_OTHER): Payer: Medicare HMO | Admitting: *Deleted

## 2021-03-30 DIAGNOSIS — Z5181 Encounter for therapeutic drug level monitoring: Secondary | ICD-10-CM | POA: Diagnosis not present

## 2021-03-30 DIAGNOSIS — I513 Intracardiac thrombosis, not elsewhere classified: Secondary | ICD-10-CM | POA: Diagnosis not present

## 2021-03-30 LAB — POCT INR: INR: 4.2 — AB (ref 2.0–3.0)

## 2021-03-30 MED ORDER — WARFARIN SODIUM 2 MG PO TABS: ORAL_TABLET | ORAL | 3 refills | Status: DC

## 2021-03-30 NOTE — Patient Instructions (Signed)
Hold warfarin tonight and tomorrow night then decrease dose to 1/2 tablet daily except 1 tablet on Sundays and Thursdays Pt denies any S/S of bleeding.  Bleeding and fall precautions discussed with pt and he verbalized understanding.   [place] Recheck in 2 wks

## 2021-03-31 ENCOUNTER — Other Ambulatory Visit: Payer: Self-pay | Admitting: Physician Assistant

## 2021-04-01 ENCOUNTER — Other Ambulatory Visit: Payer: Self-pay | Admitting: *Deleted

## 2021-04-01 MED ORDER — WARFARIN SODIUM 2 MG PO TABS: ORAL_TABLET | ORAL | 3 refills | Status: DC

## 2021-04-13 ENCOUNTER — Ambulatory Visit (INDEPENDENT_AMBULATORY_CARE_PROVIDER_SITE_OTHER): Payer: Medicare HMO | Admitting: *Deleted

## 2021-04-13 DIAGNOSIS — I513 Intracardiac thrombosis, not elsewhere classified: Secondary | ICD-10-CM

## 2021-04-13 DIAGNOSIS — Z5181 Encounter for therapeutic drug level monitoring: Secondary | ICD-10-CM

## 2021-04-13 LAB — POCT INR: INR: 3.3 — AB (ref 2.0–3.0)

## 2021-04-13 NOTE — Patient Instructions (Signed)
Description   Hold warfarin today and then START taking warfarin 1/2 a tablet daily except for 1 tablet on Thursdays.  Recheck INR in 2 weeks.

## 2021-04-26 ENCOUNTER — Ambulatory Visit (INDEPENDENT_AMBULATORY_CARE_PROVIDER_SITE_OTHER): Payer: Medicare HMO | Admitting: *Deleted

## 2021-04-26 DIAGNOSIS — I513 Intracardiac thrombosis, not elsewhere classified: Secondary | ICD-10-CM

## 2021-04-26 DIAGNOSIS — Z5181 Encounter for therapeutic drug level monitoring: Secondary | ICD-10-CM

## 2021-04-26 LAB — POCT INR: INR: 2.4 (ref 2.0–3.0)

## 2021-04-26 NOTE — Patient Instructions (Signed)
Continue warfarin 1/2 tablet daily except for 1 tablet on Sundays and Thursdays.  This is what pt has been doing.  He did not increase dose last visit. Recheck INR in 4 weeks.

## 2021-05-24 ENCOUNTER — Ambulatory Visit (INDEPENDENT_AMBULATORY_CARE_PROVIDER_SITE_OTHER): Payer: Medicare HMO | Admitting: *Deleted

## 2021-05-24 ENCOUNTER — Other Ambulatory Visit: Payer: Self-pay

## 2021-05-24 DIAGNOSIS — I513 Intracardiac thrombosis, not elsewhere classified: Secondary | ICD-10-CM

## 2021-05-24 DIAGNOSIS — Z5181 Encounter for therapeutic drug level monitoring: Secondary | ICD-10-CM

## 2021-05-24 LAB — POCT INR: INR: 1.7 — AB (ref 2.0–3.0)

## 2021-05-24 NOTE — Patient Instructions (Signed)
Take warfarin 1 1/2 tablets tonight then resume 1/2 tablet daily except for 1 tablet on Sundays and Thursdays.  Recheck INR in 3 weeks.

## 2021-06-15 ENCOUNTER — Ambulatory Visit (INDEPENDENT_AMBULATORY_CARE_PROVIDER_SITE_OTHER): Payer: Medicare HMO | Admitting: *Deleted

## 2021-06-15 ENCOUNTER — Other Ambulatory Visit: Payer: Self-pay

## 2021-06-15 DIAGNOSIS — I513 Intracardiac thrombosis, not elsewhere classified: Secondary | ICD-10-CM | POA: Diagnosis not present

## 2021-06-15 DIAGNOSIS — Z5181 Encounter for therapeutic drug level monitoring: Secondary | ICD-10-CM

## 2021-06-15 LAB — POCT INR: INR: 1.6 — AB (ref 2.0–3.0)

## 2021-06-15 NOTE — Patient Instructions (Signed)
Increase warfarin to 1 tablet daily except 1/2 tablet on Mondays, Wednesdays and Fridays Recheck INR in 3 weeks.

## 2021-07-04 ENCOUNTER — Other Ambulatory Visit: Payer: Self-pay | Admitting: Cardiology

## 2021-07-06 ENCOUNTER — Ambulatory Visit (INDEPENDENT_AMBULATORY_CARE_PROVIDER_SITE_OTHER): Payer: Medicare HMO | Admitting: *Deleted

## 2021-07-06 DIAGNOSIS — I513 Intracardiac thrombosis, not elsewhere classified: Secondary | ICD-10-CM

## 2021-07-06 DIAGNOSIS — Z5181 Encounter for therapeutic drug level monitoring: Secondary | ICD-10-CM

## 2021-07-06 LAB — POCT INR: INR: 1.5 — AB (ref 2.0–3.0)

## 2021-07-06 NOTE — Patient Instructions (Signed)
Increase warfarin to 1 tablet daily except 1/2 tablet on Mondays Recheck INR in 2 weeks.

## 2021-07-12 DIAGNOSIS — H353123 Nonexudative age-related macular degeneration, left eye, advanced atrophic without subfoveal involvement: Secondary | ICD-10-CM | POA: Diagnosis not present

## 2021-07-12 DIAGNOSIS — H52223 Regular astigmatism, bilateral: Secondary | ICD-10-CM | POA: Diagnosis not present

## 2021-07-12 DIAGNOSIS — H524 Presbyopia: Secondary | ICD-10-CM | POA: Diagnosis not present

## 2021-07-12 DIAGNOSIS — H2513 Age-related nuclear cataract, bilateral: Secondary | ICD-10-CM | POA: Diagnosis not present

## 2021-07-12 DIAGNOSIS — H353111 Nonexudative age-related macular degeneration, right eye, early dry stage: Secondary | ICD-10-CM | POA: Diagnosis not present

## 2021-07-12 DIAGNOSIS — E119 Type 2 diabetes mellitus without complications: Secondary | ICD-10-CM | POA: Diagnosis not present

## 2021-07-13 ENCOUNTER — Telehealth: Payer: Self-pay | Admitting: Cardiology

## 2021-07-13 MED ORDER — WARFARIN SODIUM 2 MG PO TABS: ORAL_TABLET | ORAL | 5 refills | Status: DC

## 2021-07-13 NOTE — Telephone Encounter (Signed)
°*  STAT* If patient is at the pharmacy, call can be transferred to refill team.   1. Which medications need to be refilled? (please list name of each medication and dose if known) warfarin (COUMADIN) 2 MG tablet  2. Which pharmacy/location (including street and city if local pharmacy) is medication to be sent to? [place], [place] - [address]  3. Do they need a 30 day or 90 day supply?  90 day supply    Pharmacy called stating the patient only has two tablets left due to having new instructions on how to take medication. Insurance will not approve a refill without the updated instructions listed in the prescription. Please advise.

## 2021-07-20 ENCOUNTER — Ambulatory Visit (INDEPENDENT_AMBULATORY_CARE_PROVIDER_SITE_OTHER): Payer: Medicare HMO | Admitting: *Deleted

## 2021-07-20 DIAGNOSIS — Z5181 Encounter for therapeutic drug level monitoring: Secondary | ICD-10-CM | POA: Diagnosis not present

## 2021-07-20 DIAGNOSIS — I513 Intracardiac thrombosis, not elsewhere classified: Secondary | ICD-10-CM

## 2021-07-20 LAB — POCT INR: INR: 4.1 — AB (ref 2.0–3.0)

## 2021-07-20 NOTE — Patient Instructions (Signed)
Hold warfarin tonight then decrease dose to 1 tablet daily except 1/2 tablet on Mondays Recheck INR in 3 weeks.

## 2021-08-11 ENCOUNTER — Encounter: Payer: Self-pay | Admitting: Cardiology

## 2021-08-11 ENCOUNTER — Ambulatory Visit (INDEPENDENT_AMBULATORY_CARE_PROVIDER_SITE_OTHER): Payer: Medicare HMO | Admitting: *Deleted

## 2021-08-11 ENCOUNTER — Ambulatory Visit: Payer: Medicare HMO | Admitting: Cardiology

## 2021-08-11 VITALS — BP 134/70 | HR 68 | Ht 72.0 in | Wt 234.2 lb

## 2021-08-11 DIAGNOSIS — E782 Mixed hyperlipidemia: Secondary | ICD-10-CM

## 2021-08-11 DIAGNOSIS — I513 Intracardiac thrombosis, not elsewhere classified: Secondary | ICD-10-CM | POA: Diagnosis not present

## 2021-08-11 DIAGNOSIS — I10 Essential (primary) hypertension: Secondary | ICD-10-CM | POA: Diagnosis not present

## 2021-08-11 DIAGNOSIS — Z5181 Encounter for therapeutic drug level monitoring: Secondary | ICD-10-CM | POA: Diagnosis not present

## 2021-08-11 DIAGNOSIS — R600 Localized edema: Secondary | ICD-10-CM | POA: Diagnosis not present

## 2021-08-11 DIAGNOSIS — I2510 Atherosclerotic heart disease of native coronary artery without angina pectoris: Secondary | ICD-10-CM | POA: Diagnosis not present

## 2021-08-11 LAB — POCT INR: INR: 2.7 (ref 2.0–3.0)

## 2021-08-11 NOTE — Patient Instructions (Signed)

## 2021-08-11 NOTE — Patient Instructions (Signed)
Continue warfarin 1 tablet daily except 1/2 tablet on Mondays and Thursdays Recheck INR in 4 weeks.

## 2021-08-11 NOTE — Progress Notes (Signed)
Clinical Summary Clifford Jones is a 71 y.o.male seen today for follow up of the following medical problems.     1. CAD - history of CABG in 2003 -  h/o non-STEMI and PCI with DES to the native RPLA via the [place] graft. He has residual VG->OM, native OM, distal LAD, and diagonal disease, which is being medically managed.        -NSTEMI 11/2019 - Cardiac catheterization performed on 12/16/2019 showed 80% distal left main with heavy calcification involving the ostium of the left circumflex up to 90%, chronic total occlusion of mid and distal LAD, 80% D1 stenosis, chronic total occlusion of ramus intermedius, distal left circumflex and proximal RCA, widely patent LIMA to LAD and patent RIMA to distal RCA, chronically occluded sequential SVG to D1 and D2, chronically occluded SVG to OM.  SVG to ramus was unable to be engaged however likely occluded RCA was not seen on the ascending aortogram.  Postprocedure, medical therapy was recommended If patient has any refractory angiogram, only interventional target would be distal left main although this will be very challenging given the heavy calcification and severe stenosis in the ostial left circumflex artery.  PCI of D1 could also be considered at the time  07/2020 echo LVEF 50-55%, sluggish apical flow without thrombus   -beta blocker dosing limited due to bradycardia   - no chest pains. No SOB/DOE - compliant with meds. Lasix is prn, has not needed.      2. LE edema =- has prn lasix but has not needed   3. HTN -- he reports a component of white coat HTN - home bps 120s-130s/50s-70s - compliant with meds   4. Hyperlipidemia - compliant with statin - labs followed by pcp  11/2020 TC 162 TG 263 HDL 45 LDL 74     5. Ampullary adenoma - noted by recent EGD, s/p ERCP and ampullectomy.        6.LV thrombus Echocardiogram performed on 12/15/2019 showed a small apical LV thrombus measuring 6 x 6 mm, EF 50 to 55%, grade 2 DD, RVSP 28.6 mmHg.      07/2020 ehco: LVEF 50-55%, no thrombus though sluggish apical flow   - denies any bleeding on coumadin   Past Medical History:  Diagnosis Date   Ampullary adenoma    Arthritis    Reported by patient   Cancer [facility])    Reported by patient   Claustrophobia    Closed fracture of right distal tibia    COPD (chronic obstructive pulmonary disease) ([place])    Reported by patient   Coronary artery disease    a. 1999 Inf MI;  b. 2003 CABGx6;  c. 2008 NSTEMI, occluded SVG to marginal and diagonal system. 2 DES placed to marginal system;  c. NSTEMI - 02/2012 [employer]ience Xpedition DES to oS-OM and POBA to mid lesion;  c. 10/2013 NSTEMI/PCI: VG->OM 80 ISR, native OM 80/90 small, LIMA->LAD ok w/ 90 dLAD, D1 80, RIMA->RPL ok w/ 95 in RPL (2.5x14 Resolute DES), nl EF.   Dyspnea    Reported by patient   GERD (gastroesophageal reflux disease)    Hyperlipidemia    Hypertension    Myocardial infarction ([place])    X 6   Pneumonia    Presence of permanent cardiac pacemaker    S/P AAA repair    Tobacco abuse    Type 2 diabetes mellitus without complication, without long-term current use of insulin ([place])    Wears dentures  Wears glasses      No Known Allergies   Current Outpatient Medications  Medication Sig Dispense Refill   acetaminophen (TYLENOL) 500 MG tablet Take 1,000 mg by mouth every 6 (six) hours as needed for mild pain, moderate pain or headache.      albuterol (PROVENTIL) (2.5 MG/3ML) 0.083% nebulizer solution Take 2.5 mg by nebulization every 4 (four) hours as needed for wheezing or shortness of breath.     amLODipine (NORVASC) 5 MG tablet TAKE 1 TABLET BY MOUTH DAILY 30 tablet 3   ascorbic acid (VITAMIN C) 500 MG tablet Take 1 tablet (500 mg total) by mouth daily. 30 tablet 1   aspirin 81 MG EC tablet Take 1 tablet (81 mg total) by mouth daily. 30 tablet 11   carvedilol (COREG) 3.125 MG tablet TAKE 1 TABLET BY MOUTH TWICE DAILY 60 tablet 3   furosemide (LASIX) 20 MG tablet TAKE 1 TABLET  BY MOUTH EVERY DAY 90 tablet 1   guaiFENesin-dextromethorphan (ROBITUSSIN DM) 100-10 MG/5ML syrup Take 10 mLs by mouth every 4 (four) hours as needed for cough. 118 mL 0   ipratropium-albuterol (DUONEB) 0.5-2.5 (3) MG/3ML SOLN Inhale 3 mLs into the lungs every 6 (six) hours. (Patient taking differently: Inhale 3 mLs into the lungs every 6 (six) hours as needed (for wheezing or shortness of breath).) 360 mL 3   isosorbide mononitrate (IMDUR) 30 MG 24 hr tablet Take 1 tablet (30 mg total) by mouth daily. 90 tablet 1   lisinopril (ZESTRIL) 2.5 MG tablet Take 1 tablet (2.5 mg total) by mouth daily. 60 tablet 1   magnesium hydroxide (MILK OF MAGNESIA) 400 MG/5ML suspension Take 15 mLs by mouth daily as needed for mild constipation. 355 mL 0   metFORMIN (GLUCOPHAGE XR) 750 MG 24 hr tablet Take 1 tablet (750 mg total) by mouth daily with breakfast. 30 tablet 11   nitroGLYCERIN (NITROSTAT) 0.4 MG SL tablet Place 1 tablet (0.4 mg total) under the tongue every 5 (five) minutes x 3 doses as needed for chest pain. 25 tablet 4   pantoprazole (PROTONIX) 40 MG tablet TAKE 1 TABLET BY MOUTH TWICE DAILY (Patient taking differently: Take 40 mg by mouth 2 (two) times daily.) 60 tablet 3   RANEXA 1000 MG SR tablet TAKE ONE TABLET BY MOUTH TWICE DAILY 30 tablet 0   rosuvastatin (CRESTOR) 40 MG tablet Take 1 tablet (40 mg total) by mouth at bedtime. 90 tablet 1   vitamin B-12 (CYANOCOBALAMIN) 1000 MCG tablet Take 1,000 mcg by mouth daily.     warfarin (COUMADIN) 2 MG tablet Take 1 tablet daily except 1/2 tablets on Mondays or as directed 30 tablet 5   zinc gluconate 50 MG tablet Take 50 mg by mouth daily.     zinc sulfate 220 (50 Zn) MG capsule Take 1 capsule (220 mg total) by mouth daily. (Patient not taking: No sig reported) 30 capsule 1   No current facility-administered medications for this visit.     Past Surgical History:  Procedure Laterality Date   ABDOMINAL AORTIC ANEURYSM REPAIR  2006   APPENDECTOMY      BIOPSY  03/08/2021   Procedure: BIOPSY;  Surgeon: Rush Landmark Telford Nab., MD;  Location: [facility];  Service: Gastroenterology;;   CORONARY ANGIOPLASTY WITH STENT PLACEMENT     CORONARY ARTERY BYPASS GRAFT  2003   ENDOSCOPIC RETROGRADE CHOLANGIOPANCREATOGRAPHY (ERCP) WITH PROPOFOL N/A 03/25/2019   Procedure: ENDOSCOPIC RETROGRADE CHOLANGIOPANCREATOGRAPHY (ERCP) WITH PROPOFOL;  Surgeon: Irving Copas., MD;  Location:  [place] [facility];  Service: Gastroenterology;  Laterality: N/A;   ENDOSCOPIC RETROGRADE CHOLANGIOPANCREATOGRAPHY (ERCP) WITH PROPOFOL N/A 03/08/2021   Procedure: ENDOSCOPIC RETROGRADE CHOLANGIOPANCREATOGRAPHY (ERCP) WITH PROPOFOL;  Surgeon: Rush Landmark Telford Nab., MD;  Location: [facility];  Service: Gastroenterology;  Laterality: N/A;   ESOPHAGEAL DILATION  07/06/2018   Procedure: ESOPHAGEAL DILATION;  Surgeon: Rogene Houston, MD;  [facility];  Service: Endoscopy;;   ESOPHAGOGASTRODUODENOSCOPY N/A 07/06/2018   Procedure: ESOPHAGOGASTRODUODENOSCOPY (EGD);  Surgeon: Rogene Houston, MD;  [facility];  Service: Endoscopy;  Laterality: N/A;   ESOPHAGOGASTRODUODENOSCOPY N/A 08/16/2018   Procedure: ESOPHAGOGASTRODUODENOSCOPY (EGD);  Surgeon: Milus Banister, MD;  Location: [facility];  Service: Endoscopy;  Laterality: N/A;   ESOPHAGOGASTRODUODENOSCOPY (EGD) WITH PROPOFOL N/A 03/25/2019   Procedure: ESOPHAGOGASTRODUODENOSCOPY (EGD) WITH PROPOFOL;  Surgeon: Rush Landmark Telford Nab., MD;  Location: [place];  Service: Gastroenterology;  Laterality: N/A;   EUS N/A 08/16/2018   Procedure: UPPER ENDOSCOPIC ULTRASOUND (EUS) RADIAL;  Surgeon: Milus Banister, MD;  Location: [facility];  Service: Endoscopy;  Laterality: N/A;   EUS N/A 03/25/2019   Procedure: UPPER ENDOSCOPIC ULTRASOUND (EUS) RADIAL;  Surgeon: Irving Copas., MD;  Location: [place];  Service: Gastroenterology;  Laterality: N/A;   FOREIGN BODY REMOVAL  07/06/2018   Procedure:  FOREIGN BODY REMOVAL;  Surgeon: Rogene Houston, MD;  [facility];  Service: Endoscopy;;   HERNIA REPAIR     umbicical   LEFT HEART CATHETERIZATION WITH CORONARY ANGIOGRAM N/A 03/05/2012   Procedure: LEFT HEART CATHETERIZATION WITH CORONARY ANGIOGRAM;  Surgeon: Hillary Bow, MD;  Location: [facility] CATH LAB;  Service: Cardiovascular;  Laterality: N/A;   LEFT HEART CATHETERIZATION WITH CORONARY ANGIOGRAM N/A 10/23/2013   Procedure: LEFT HEART CATHETERIZATION WITH CORONARY ANGIOGRAM;  Surgeon: Blane Ohara, MD;  Location: [facility] CATH LAB;  Service: Cardiovascular;  Laterality: N/A;   LEFT HEART CATHETERIZATION WITH CORONARY/GRAFT ANGIOGRAM  10/22/2013   Procedure: LEFT HEART CATHETERIZATION WITH Beatrix Fetters;  Surgeon: Leonie Man, MD;  Location: [facility] CATH LAB;  Service: Cardiovascular;;   PANCREATIC STENT PLACEMENT  03/25/2019   Procedure: PANCREATIC STENT PLACEMENT;  Surgeon: Rush Landmark Telford Nab., MD;  Location: [place];  Service: Gastroenterology;;   PANCREATIC STENT PLACEMENT  03/08/2021   Procedure: PANCREATIC STENT PLACEMENT;  Surgeon: Irving Copas., MD;  Location: [facility];  Service: Gastroenterology;;   PERCUTANEOUS CORONARY STENT INTERVENTION (PCI-S) N/A 03/07/2012   Procedure: PERCUTANEOUS CORONARY STENT INTERVENTION (PCI-S);  Surgeon: Sherren Mocha, MD;  Location: [facility] CATH LAB;  Service: Cardiovascular;  Laterality: N/A;   PERCUTANEOUS CORONARY STENT INTERVENTION (PCI-S) Right 10/23/2013   Procedure: PERCUTANEOUS CORONARY STENT INTERVENTION (PCI-S);  Surgeon: Blane Ohara, MD;  Location: [facility] CATH LAB;  Service: Cardiovascular;  Laterality: Right;   POLYPECTOMY  07/06/2018   Procedure: POLYPECTOMY;  Surgeon: Rogene Houston, MD;  [facility];  Service: Endoscopy;;  polyp at ampulla   POLYPECTOMY  03/25/2019   Procedure: AMPULLECTOMY;  Surgeon: Mansouraty, Telford Nab., MD;  Location: [place];  Service: Gastroenterology;;   REMOVAL OF  STONES  03/08/2021   Procedure: REMOVAL OF STONES;  Surgeon: Irving Copas., MD;  Location: [facility];  Service: Gastroenterology;;   Right hand surgery     RIGHT HEART CATH AND CORONARY/GRAFT ANGIOGRAPHY N/A 12/16/2019   Procedure: RIGHT HEART CATH AND CORONARY/GRAFT ANGIOGRAPHY;  Surgeon: Nelva Bush, MD;  Location: [place] CV LAB;  Service: Cardiovascular;  Laterality: N/A;   SPHINCTEROTOMY  03/25/2019   Procedure: SPHINCTEROTOMY;  Surgeon: Rush Landmark Telford Nab., MD;  Location: [place];  Service: Gastroenterology;;   TIBIA IM NAIL INSERTION Right 04/07/2016   Procedure: FIXATION OF RIGHT TIBIA FRACTURE;  Surgeon: Garald Balding, MD;  Location: [place];  Service: Orthopedics;  Laterality: Right;     No Known Allergies    Family History  Problem Relation Age of Onset   Coronary artery disease Other    Colon cancer Neg Hx    Esophageal cancer Neg Hx    Inflammatory bowel disease Neg Hx    Liver disease Neg Hx    Pancreatic cancer Neg Hx    Rectal cancer Neg Hx    Stomach cancer Neg Hx      Social History Clifford Jones reports that he quit smoking about 9 months ago. His smoking use included cigarettes. He started smoking about 56 years ago. He has a 54.00 pack-year smoking history. He has never used smokeless tobacco. Clifford Jones reports current alcohol use.   Review of Systems CONSTITUTIONAL: No weight loss, fever, chills, weakness or fatigue.  HEENT: Eyes: No visual loss, blurred vision, double vision or yellow sclerae.No hearing loss, sneezing, congestion, runny nose or sore throat.  SKIN: No rash or itching.  CARDIOVASCULAR: per hpi RESPIRATORY: No shortness of breath, cough or sputum.  GASTROINTESTINAL: No anorexia, nausea, vomiting or diarrhea. No abdominal pain or blood.  GENITOURINARY: No burning on urination, no polyuria NEUROLOGICAL: No headache, dizziness, syncope, paralysis, ataxia, numbness or tingling in the extremities. No change in bowel  or bladder control.  MUSCULOSKELETAL: No muscle, back pain, joint pain or stiffness.  LYMPHATICS: No enlarged nodes. No history of splenectomy.  PSYCHIATRIC: No history of depression or anxiety.  ENDOCRINOLOGIC: No reports of sweating, cold or heat intolerance. No polyuria or polydipsia.  Marland Kitchen   Physical Examination Today's Vitals   08/11/21 1100 08/11/21 1133  BP: (!) 160/78 134/70  Pulse: 68   SpO2: 95%   Weight: 234 lb 3.2 oz (106.2 kg)   Height: 6' (1.829 m)    Body mass index is 31.76 kg/m.  Gen: resting comfortably, no acute distress HEENT: no scleral icterus, pupils equal round and reactive, no palptable cervical adenopathy,  CV: RRR, no m/rg no jvd Resp: Clear to auscultation bilaterally GI: abdomen is soft, non-tender, non-distended, normal bowel sounds, no hepatosplenomegaly MSK: extremities are warm, no edema.  Skin: warm, no rash Neuro:  no focal deficits Psych: appropriate affect   Diagnostic Studies   02/2015 echo Study Conclusions   - Left ventricle: The cavity size was normal. There was mild   concentric hypertrophy. Systolic function was normal. The   estimated ejection fraction was in the range of 55% to 60%.   Possible hypokinesis of the basalinferior myocardium. Marked   bradycardia limits evaluation of LV diastolic function. - Aortic valve: There was trivial regurgitation. - Left atrium: The atrium was moderately dilated. - Right ventricle: Systolic function was mildly reduced.     02/2015 nuclear stress There was no ST segment deviation noted during stress. T wave inversion of 1 mm was noted during stress in the I, II, aVF, V5 and V6 leads. T wave inversion persisted. Defect 1: There is a medium defect of severe severity present in the apical anterior, apical inferior and apex location. Findings consistent with prior myocardial infarction. There is no evidence of ischemia. This is an intermediate risk study. Nuclear stress EF: 39%.     Assessment and Plan  1. CAD - denies symptoms, continue current meds -  severe history of CAD/extensive disease, have continued ASA while on coumadin in this setting.    2. LE edema - no recent issues, continue prn lasix.    3.LV thrombus - 07/2020 echo show resolution, but apical sluggish flow, high risk of recurrence - continue coumadin  4. HTN - home bp's at goal, repeat manual bp 134/70 essentially at goal - continue current meds  5. Hyperlipidemia - request pcp labs, continue crestor    Arnoldo Lenis, M.D

## 2021-08-30 ENCOUNTER — Encounter: Payer: Self-pay | Admitting: Cardiology

## 2021-08-30 DIAGNOSIS — J449 Chronic obstructive pulmonary disease, unspecified: Secondary | ICD-10-CM | POA: Diagnosis not present

## 2021-08-30 DIAGNOSIS — F17210 Nicotine dependence, cigarettes, uncomplicated: Secondary | ICD-10-CM | POA: Diagnosis not present

## 2021-08-30 DIAGNOSIS — I10 Essential (primary) hypertension: Secondary | ICD-10-CM | POA: Diagnosis not present

## 2021-08-30 DIAGNOSIS — K2100 Gastro-esophageal reflux disease with esophagitis, without bleeding: Secondary | ICD-10-CM | POA: Diagnosis not present

## 2021-08-30 DIAGNOSIS — Z955 Presence of coronary angioplasty implant and graft: Secondary | ICD-10-CM | POA: Diagnosis not present

## 2021-08-30 DIAGNOSIS — F419 Anxiety disorder, unspecified: Secondary | ICD-10-CM | POA: Diagnosis not present

## 2021-08-30 DIAGNOSIS — Z6831 Body mass index (BMI) 31.0-31.9, adult: Secondary | ICD-10-CM | POA: Diagnosis not present

## 2021-08-30 DIAGNOSIS — R739 Hyperglycemia, unspecified: Secondary | ICD-10-CM | POA: Diagnosis not present

## 2021-09-07 ENCOUNTER — Ambulatory Visit (INDEPENDENT_AMBULATORY_CARE_PROVIDER_SITE_OTHER): Payer: Medicare HMO | Admitting: *Deleted

## 2021-09-07 DIAGNOSIS — I513 Intracardiac thrombosis, not elsewhere classified: Secondary | ICD-10-CM

## 2021-09-07 DIAGNOSIS — Z5181 Encounter for therapeutic drug level monitoring: Secondary | ICD-10-CM

## 2021-09-07 LAB — POCT INR: INR: 2.8 (ref 2.0–3.0)

## 2021-09-07 NOTE — Patient Instructions (Signed)
Continue warfarin 1 tablet daily except 1/2 tablet on Mondays and Thursdays ?Recheck INR in 4 weeks.  ?

## 2021-10-04 DIAGNOSIS — H353134 Nonexudative age-related macular degeneration, bilateral, advanced atrophic with subfoveal involvement: Secondary | ICD-10-CM | POA: Diagnosis not present

## 2021-10-04 DIAGNOSIS — H2513 Age-related nuclear cataract, bilateral: Secondary | ICD-10-CM | POA: Diagnosis not present

## 2021-10-04 DIAGNOSIS — H01001 Unspecified blepharitis right upper eyelid: Secondary | ICD-10-CM | POA: Diagnosis not present

## 2021-10-04 DIAGNOSIS — H01002 Unspecified blepharitis right lower eyelid: Secondary | ICD-10-CM | POA: Diagnosis not present

## 2021-10-05 ENCOUNTER — Ambulatory Visit (INDEPENDENT_AMBULATORY_CARE_PROVIDER_SITE_OTHER): Payer: Medicare HMO | Admitting: *Deleted

## 2021-10-05 DIAGNOSIS — I513 Intracardiac thrombosis, not elsewhere classified: Secondary | ICD-10-CM

## 2021-10-05 DIAGNOSIS — Z5181 Encounter for therapeutic drug level monitoring: Secondary | ICD-10-CM | POA: Diagnosis not present

## 2021-10-05 LAB — POCT INR: INR: 2.1 (ref 2.0–3.0)

## 2021-10-05 NOTE — Patient Instructions (Signed)
Continue warfarin 1 tablet daily except 1/2 tablet on Mondays and Thursdays Recheck INR in 6 weeks.  

## 2021-10-22 IMAGING — DX DG ABDOMEN 1V
2 series · 2 of 2 positions shown · non-contrast
Comparison: 05/08/2019

CLINICAL DATA: Biliary duct stent evaluation.

EXAM:
ABDOMEN - 1 VIEW

[abdomen kub (1 of 2)]
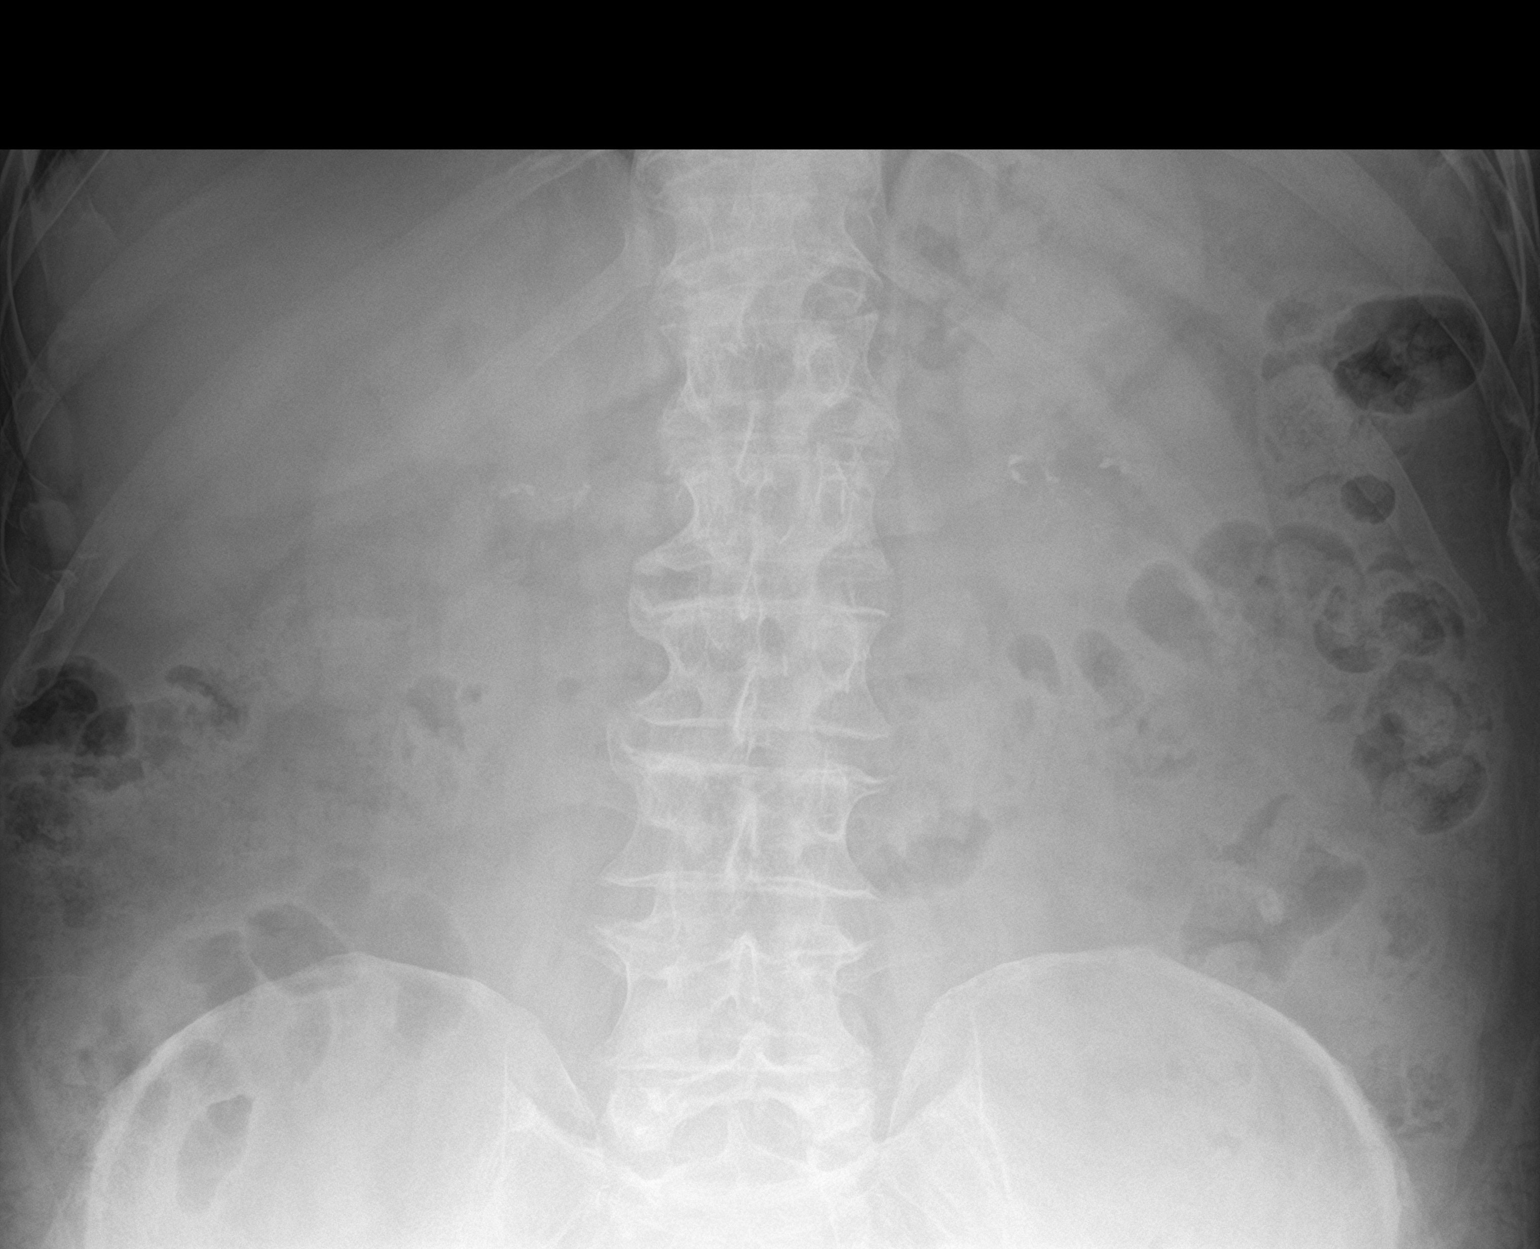

[abdomen kub (2 of 2)]
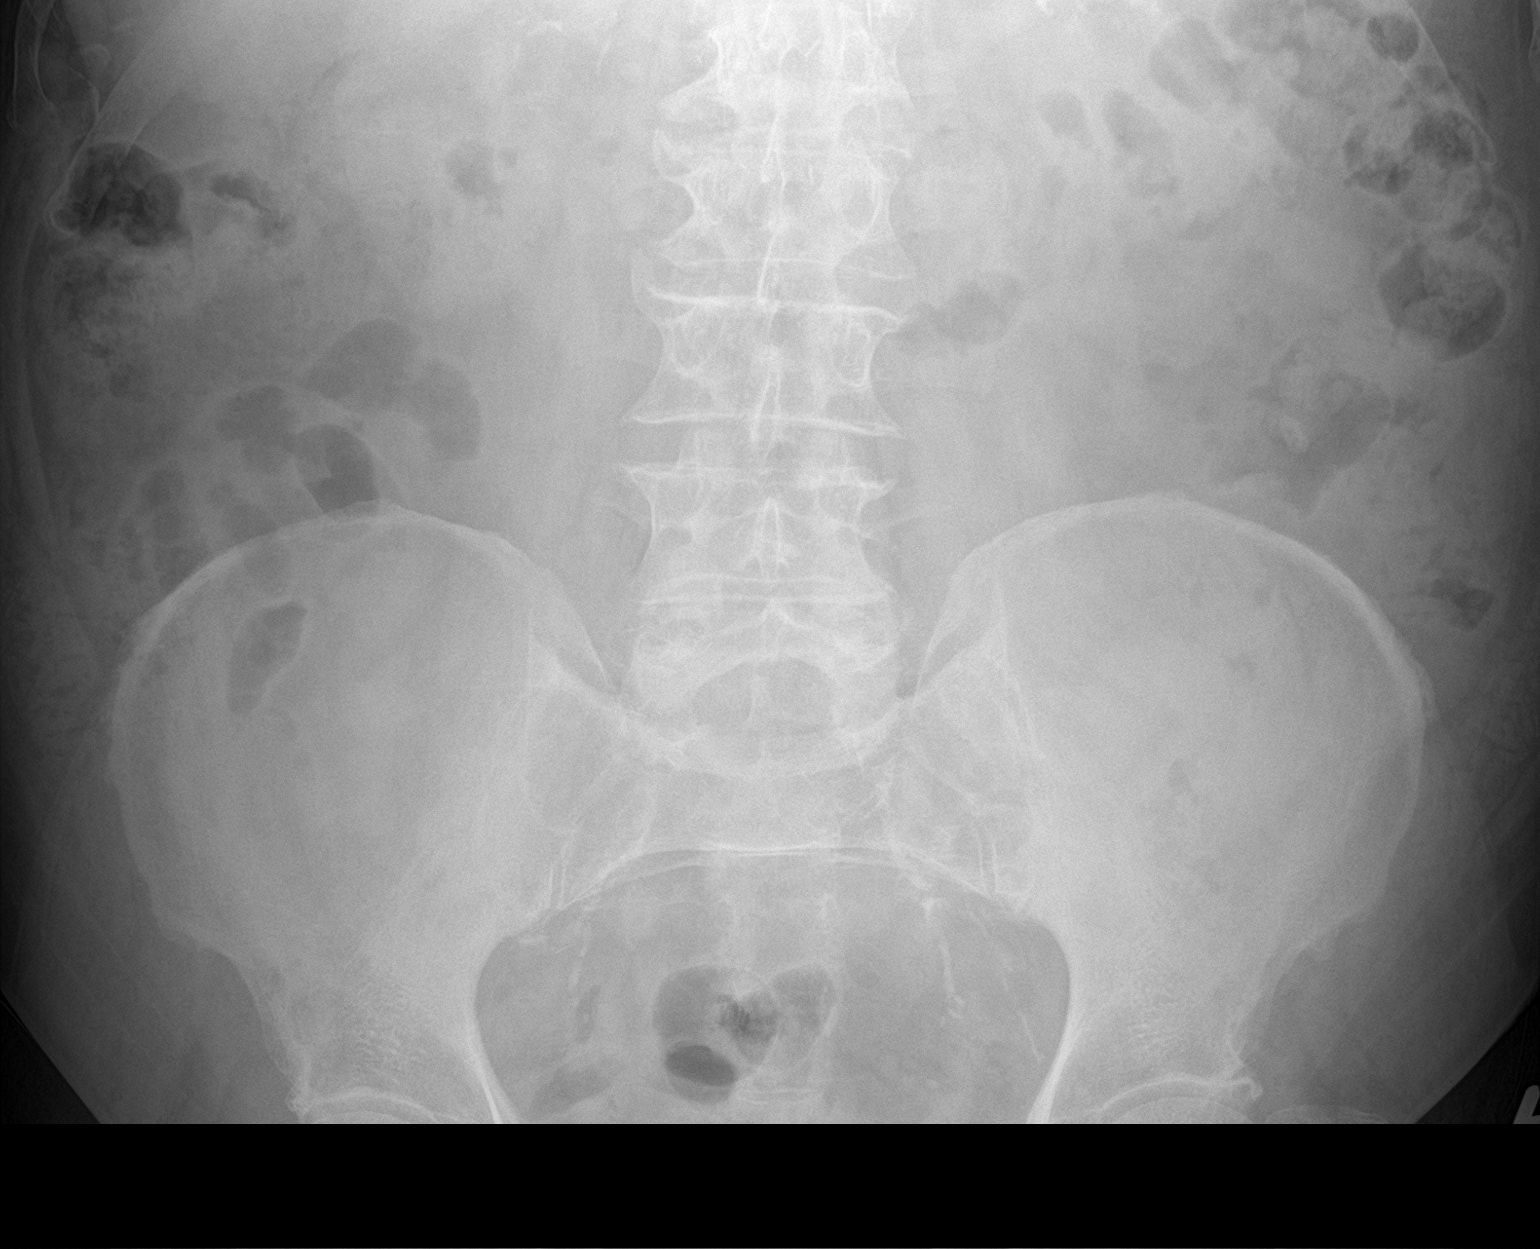

[2 of 2 positions shown; findings below may reference images not displayed]

FINDINGS: Bowel gas pattern is nonobstructive. No free peritoneal air. No
definite biliary stent visualized. Degenerative change of the spine
and hips.
IMPRESSION: Nonobstructive bowel gas pattern. No biliary stent visualized.

## 2021-10-28 DIAGNOSIS — H2511 Age-related nuclear cataract, right eye: Secondary | ICD-10-CM | POA: Diagnosis not present

## 2021-10-29 ENCOUNTER — Other Ambulatory Visit: Payer: Self-pay | Admitting: Cardiology

## 2021-10-29 ENCOUNTER — Encounter (HOSPITAL_COMMUNITY)
Admission: RE | Admit: 2021-10-29 | Discharge: 2021-10-29 | Disposition: A | Discharge status: Home / Self Care | Payer: Medicare HMO | Source: Ambulatory Visit | Attending: Ophthalmology | Admitting: Ophthalmology

## 2021-11-03 NOTE — H&P (Addendum)
Surgical History & Physical ? ?Patient Name: Clifford Diehl                  DOB: 31-Jan-1951 ? ?Surgery: Cataract extraction with intraocular lens implant phacoemulsification; Right Eye ? ?Surgeon: Baruch Goldmann MD ?Surgery Date:  11-05-21 ?Pre-Op Date:  10-28-21 ?HPI: ?A 32 Yr. old male patient 1. 1. The patient complains of difficulty when driving, which began many years ago. Both eyes are affected, OD is worse. Has had poor vision OS for a long time. The episode is constant and gradual. The condition's severity decreased since last visit. The condition is worse with daily activities. The complaint is associated with blurry vision. The patient has not driven in 6 months due to his vision OU, and states that he has enough difficulty getting around the house. This is negatively affecting the patient's quality of life and the patient is unable to function adequately in life with the current level of vision.The patient experiences no flashes, floater, shadow, curtain or veil. The patient also complains of itching, burning, and dryness OU. The patient does not use any eye drops. HPI was performed by Baruch Goldmann . ? ?Medical History: ?Macula Degeneration ?Amblyopia OS([place]) ?Diabetes ?GERD, Hx of Poiliomyelitis 1959, Hx of Covid 2022. ?Heart Problem ?High Blood Pressure ?LDL ?Lung Problems ? ?Review of Systems ?Negative Allergic/Immunologic ?Negative Cardiovascular ?Negative Constitutional ?Negative Ear, Nose, Mouth & Throat ?Negative Endocrine ?Negative Eyes ?Negative Gastrointestinal ?Negative Genitourinary ?Negative Hemotologic/Lymphatic ?Negative Integumentary ?Negative Musculoskeletal ?Negative Neurological ?Negative Psychiatry ?Negative Respiratory ? ?Social ?  Former smoker  ? ?Medication ?Budesonide, Ipratropium Bromide/Albuterol, Loperamide HCL, Aspirin, Carvedilol, Amlodipine, Furosemide, Lisinopril, Pantoprazole, Rosuvastatin, Warfarin, Enaxaparin, Nitroglycerin,  ? ?Sx/Procedures ? ?Coronary Bypass(X%),  Coronary Stent, Angioplasty, Hernia Sx, Tumor Removal- Pancreas,  ? ?Drug Allergies  ? NKDA ? ?History & Physical: ?Heent: Cataract, Right Eye ?NECK: supple without bruits ?LUNGS: lungs clear to auscultation ?CV: regular rate and rhythm ?Abdomen: soft and non-tender ? ?Impression & Plan: ?Assessment: ?1.  NUCLEAR SCLEROSIS AGE RELATED; Both Eyes (H25.13) ?2.  AGE-RELATED MACULAR DEGENERATION DRY; Both Eyes Adv atrophic w subfoveal Involvement (H35.3134) ?3.  BLEPHARITIS; Right Upper Lid, Right Lower Lid, Left Upper Lid, Left Lower Lid (H01.001, H01.002,H01.004,H01.005) ?4.  ASTIGMATISM, REGULAR; Both Eyes (H52.223) ?5.  blindness left eye category 4, blindness right eye category 1 (O67.67M0) ? ?Plan: 1.  Cataract accounts for the patient's decreased vision. This visual impairment is not correctable with a tolerable change in glasses or contact lenses. Cataract surgery with an implantation of a new lens should significantly improve the visual and functional status of the patient. Discussed all risks, benefits, alternatives, and potential complications. Discussed the procedures and recovery. Patient desires to have surgery. A-scan ordered and performed today for intra-ocular lens calculations. The surgery will be performed in order to improve vision for driving, reading, and for eye examinations. Recommend phacoemulsification with intra-ocular lens. Recommend Dextenza for post-operative pain and inflammation. ?Right Eye worse and only. ?Dilates poorly - shugarcaine by protocol. ?Malyugin Ring. ?Omidira. ?Toric Lens. ? ?2.  Could be candidate for new treatment. ?Will refer to [facility] for evaluation. ? ?3.  Recommend regular lid cleaning. ? ?4.  Toric IOL. ? ?5.  Monocular precautions discussed, including wearing shatterproof lenses. ? ?

## 2021-11-05 ENCOUNTER — Encounter (HOSPITAL_COMMUNITY)
Admission: RE | Disposition: A | Discharge status: Home / Self Care | Payer: Self-pay | Source: Home / Self Care | Attending: Ophthalmology

## 2021-11-05 ENCOUNTER — Ambulatory Visit (HOSPITAL_COMMUNITY): Payer: Medicare HMO | Admitting: Anesthesiology

## 2021-11-05 ENCOUNTER — Encounter (HOSPITAL_COMMUNITY): Payer: Self-pay | Admitting: Ophthalmology

## 2021-11-05 ENCOUNTER — Ambulatory Visit (HOSPITAL_COMMUNITY)
Admission: RE | Admit: 2021-11-05 | Discharge: 2021-11-05 | Disposition: A | Discharge status: Home / Self Care | Payer: Medicare HMO | Attending: Ophthalmology | Admitting: Ophthalmology

## 2021-11-05 ENCOUNTER — Ambulatory Visit (HOSPITAL_BASED_OUTPATIENT_CLINIC_OR_DEPARTMENT_OTHER): Payer: Medicare HMO | Admitting: Anesthesiology

## 2021-11-05 DIAGNOSIS — H353134 Nonexudative age-related macular degeneration, bilateral, advanced atrophic with subfoveal involvement: Secondary | ICD-10-CM | POA: Insufficient documentation

## 2021-11-05 DIAGNOSIS — Z87891 Personal history of nicotine dependence: Secondary | ICD-10-CM | POA: Diagnosis not present

## 2021-11-05 DIAGNOSIS — H2181 Floppy iris syndrome: Secondary | ICD-10-CM | POA: Diagnosis not present

## 2021-11-05 DIAGNOSIS — H5442A4 Blindness left eye category 4, normal vision right eye: Secondary | ICD-10-CM | POA: Insufficient documentation

## 2021-11-05 DIAGNOSIS — H52221 Regular astigmatism, right eye: Secondary | ICD-10-CM

## 2021-11-05 DIAGNOSIS — E1136 Type 2 diabetes mellitus with diabetic cataract: Secondary | ICD-10-CM | POA: Diagnosis not present

## 2021-11-05 DIAGNOSIS — E1151 Type 2 diabetes mellitus with diabetic peripheral angiopathy without gangrene: Secondary | ICD-10-CM | POA: Diagnosis not present

## 2021-11-05 DIAGNOSIS — K219 Gastro-esophageal reflux disease without esophagitis: Secondary | ICD-10-CM | POA: Insufficient documentation

## 2021-11-05 DIAGNOSIS — I25119 Atherosclerotic heart disease of native coronary artery with unspecified angina pectoris: Secondary | ICD-10-CM | POA: Diagnosis not present

## 2021-11-05 DIAGNOSIS — H0100A Unspecified blepharitis right eye, upper and lower eyelids: Secondary | ICD-10-CM | POA: Insufficient documentation

## 2021-11-05 DIAGNOSIS — I252 Old myocardial infarction: Secondary | ICD-10-CM | POA: Diagnosis not present

## 2021-11-05 DIAGNOSIS — J449 Chronic obstructive pulmonary disease, unspecified: Secondary | ICD-10-CM | POA: Insufficient documentation

## 2021-11-05 DIAGNOSIS — H0100B Unspecified blepharitis left eye, upper and lower eyelids: Secondary | ICD-10-CM | POA: Insufficient documentation

## 2021-11-05 DIAGNOSIS — H2511 Age-related nuclear cataract, right eye: Secondary | ICD-10-CM

## 2021-11-05 DIAGNOSIS — M1990 Unspecified osteoarthritis, unspecified site: Secondary | ICD-10-CM | POA: Insufficient documentation

## 2021-11-05 DIAGNOSIS — Z7984 Long term (current) use of oral hypoglycemic drugs: Secondary | ICD-10-CM | POA: Diagnosis not present

## 2021-11-05 DIAGNOSIS — I10 Essential (primary) hypertension: Secondary | ICD-10-CM | POA: Diagnosis not present

## 2021-11-05 HISTORY — PX: CATARACT EXTRACTION W/PHACO: SHX586

## 2021-11-05 SURGERY — PHACOEMULSIFICATION, CATARACT, WITH IOL INSERTION
Anesthesia: Monitor Anesthesia Care | Site: Eye | Laterality: Right

## 2021-11-05 MED ORDER — BSS IO SOLN
INTRAOCULAR | Status: DC | PRN
  Administered 2021-11-05: 15 mL via INTRAOCULAR

## 2021-11-05 MED ORDER — PHENYLEPHRINE-KETOROLAC 1-0.3 % IO SOLN
INTRAOCULAR | Status: DC | PRN
  Administered 2021-11-05: 500 mL via OPHTHALMIC

## 2021-11-05 MED ORDER — POVIDONE-IODINE 5 % OP SOLN
OPHTHALMIC | Status: DC | PRN
  Administered 2021-11-05: 1 via OPHTHALMIC

## 2021-11-05 MED ORDER — TETRACAINE HCL 0.5 % OP SOLN
1.0000 [drp] | OPHTHALMIC | Status: AC | PRN
  Administered 2021-11-05 (×3): 1 [drp] via OPHTHALMIC

## 2021-11-05 MED ORDER — LIDOCAINE HCL 3.5 % OP GEL: 1.0000 "application " | Freq: Once | OPHTHALMIC | Status: DC

## 2021-11-05 MED ORDER — NEOMYCIN-POLYMYXIN-DEXAMETH 3.5-10000-0.1 OP SUSP
OPHTHALMIC | Status: DC | PRN
  Administered 2021-11-05: 1 [drp] via OPHTHALMIC

## 2021-11-05 MED ORDER — TROPICAMIDE 1 % OP SOLN
1.0000 [drp] | OPHTHALMIC | Status: AC | PRN
  Administered 2021-11-05 (×3): 1 [drp] via OPHTHALMIC

## 2021-11-05 MED ORDER — PHENYLEPHRINE HCL 2.5 % OP SOLN
1.0000 [drp] | OPHTHALMIC | Status: AC | PRN
  Administered 2021-11-05 (×3): 1 [drp] via OPHTHALMIC

## 2021-11-05 MED ORDER — SODIUM HYALURONATE 10 MG/ML IO SOLUTION
PREFILLED_SYRINGE | INTRAOCULAR | Status: DC | PRN
  Administered 2021-11-05: 0.85 mL via INTRAOCULAR

## 2021-11-05 MED ORDER — STERILE WATER FOR IRRIGATION IR SOLN
Status: DC | PRN
  Administered 2021-11-05: 500 mL

## 2021-11-05 MED ORDER — SODIUM HYALURONATE 23MG/ML IO SOSY
PREFILLED_SYRINGE | INTRAOCULAR | Status: DC | PRN
  Administered 2021-11-05: 0.6 mL via INTRAOCULAR

## 2021-11-05 MED ORDER — PHENYLEPHRINE-KETOROLAC 1-0.3 % IO SOLN
INTRAOCULAR | Status: AC
  Filled 2021-11-05: qty 4

## 2021-11-05 MED ORDER — LIDOCAINE HCL (PF) 1 % IJ SOLN
INTRAOCULAR | Status: DC | PRN
  Administered 2021-11-05: 1 mL via OPHTHALMIC

## 2021-11-05 SURGICAL SUPPLY — 15 items
CATARACT SUITE SIGHTPATH (MISCELLANEOUS) ×2 IMPLANT
CLOTH BEACON ORANGE TIMEOUT ST (SAFETY) ×2 IMPLANT
EYE SHIELD UNIVERSAL CLEAR (GAUZE/BANDAGES/DRESSINGS) ×1 IMPLANT
FEE CATARACT SUITE SIGHTPATH (MISCELLANEOUS) ×1 IMPLANT
GLOVE BIOGEL PI IND STRL 7.0 (GLOVE) ×2 IMPLANT
GLOVE BIOGEL PI INDICATOR 7.0 (GLOVE) ×1
GLOVE SURG SS PI 7.0 STRL IVOR (GLOVE) ×1 IMPLANT
LENS IOL EYHANCE TRC 525 17.5 IMPLANT
LENS IOL TORIC DIU525 17.5 ×2 IMPLANT
PAD ARMBOARD 7.5X6 YLW CONV (MISCELLANEOUS) ×2 IMPLANT
RING MALYGIN 7.0 (MISCELLANEOUS) ×1 IMPLANT
SYR TB 1ML LL NO SAFETY (SYRINGE) ×2 IMPLANT
TAPE SURG TRANSPORE 1 IN (GAUZE/BANDAGES/DRESSINGS) IMPLANT
TAPE SURGICAL TRANSPORE 1 IN (GAUZE/BANDAGES/DRESSINGS) ×2
WATER STERILE IRR 250ML POUR (IV SOLUTION) ×2 IMPLANT

## 2021-11-05 NOTE — Interval H&P Note (Signed)
History and Physical Interval Note:  11/05/2021 10:25 AM  [name]  has presented today for surgery, with the diagnosis of nuclear sclerosis age related cataract; right.  The various methods of treatment have been discussed with the patient and family. After consideration of risks, benefits and other options for treatment, the patient has consented to  Procedure(s) with comments: CATARACT EXTRACTION PHACO AND INTRAOCULAR LENS PLACEMENT (IOC) (Right) - right as a surgical intervention.  The patient's history has been reviewed, patient examined, no change in status, stable for surgery.  I have reviewed the patient's chart and labs.  Questions were answered to the patient's satisfaction.     Clifford Jones

## 2021-11-05 NOTE — Transfer of Care (Signed)
Immediate Anesthesia Transfer of Care Note  Patient: Clifford Jones  Procedure(s) Performed: CATARACT EXTRACTION PHACO AND INTRAOCULAR LENS PLACEMENT (IOC) (Right: Eye)  Patient Location: Short Stay  Anesthesia Type:MAC  Level of Consciousness: awake  Airway & Oxygen Therapy: Patient Spontanous Breathing  Post-op Assessment: Report given to RN  Post vital signs: Reviewed and stable  Last Vitals:  Vitals Value Taken Time  BP 153/72 11/05/21 1052  Temp 36.1 C 11/05/21 1052  Pulse 67 11/05/21 1052  Resp 17 11/05/21 1052  SpO2 97 % 11/05/21 1052    Last Pain:  Vitals:   11/05/21 1052  TempSrc: Oral  PainSc:       Patients Stated Pain Goal: 5 (37/79/39 [id number])  Complications: No notable events documented.

## 2021-11-05 NOTE — Anesthesia Preprocedure Evaluation (Signed)
Anesthesia Evaluation  Patient identified by MRN, date of birth, ID band Patient awake    Reviewed: Allergy & Precautions, NPO status , Patient's Chart, lab work & pertinent test results, reviewed documented beta blocker date and time   Airway Mallampati: II  TM Distance: >3 FB Neck ROM: Full    Dental  (+) Dental Advisory Given, Upper Dentures   Pulmonary shortness of breath and with exertion, pneumonia, COPD, Current Smoker and Patient abstained from smoking.,    Pulmonary exam normal breath sounds clear to auscultation       Cardiovascular Exercise Tolerance: Good hypertension, Pt. on medications and Pt. on home beta blockers + angina + CAD, + Past MI and + Peripheral Vascular Disease  Normal cardiovascular exam+ pacemaker  Rhythm:Regular Rate:Normal     Neuro/Psych negative neurological ROS  negative psych ROS   GI/Hepatic Neg liver ROS, GERD  Medicated and Controlled,  Endo/Other  diabetes, Well Controlled, Type 2, Oral Hypoglycemic Agents  Renal/GU negative Renal ROS  negative genitourinary   Musculoskeletal  (+) Arthritis , Osteoarthritis,    Abdominal   Peds negative pediatric ROS (+)  Hematology negative hematology ROS (+)   Anesthesia Other Findings   Reproductive/Obstetrics negative OB ROS                             Anesthesia Physical Anesthesia Plan  ASA: 3  Anesthesia Plan: MAC   Post-op Pain Management: Minimal or no pain anticipated   Induction:   PONV Risk Score and Plan:   Airway Management Planned: Nasal Cannula and Natural Airway  Additional Equipment:   Intra-op Plan:   Post-operative Plan:   Informed Consent: I have reviewed the patients History and Physical, chart, labs and discussed the procedure including the risks, benefits and alternatives for the proposed anesthesia with the patient or authorized representative who has indicated his/her  understanding and acceptance.     Dental advisory given  Plan Discussed with: CRNA and Surgeon  Anesthesia Plan Comments:        Anesthesia Quick Evaluation

## 2021-11-05 NOTE — Discharge Instructions (Signed)
Please discharge patient when stable, will follow up today with Dr. Redith Drach at the [facility] office immediately following discharge.  Leave shield in place until visit.  All paperwork with discharge instructions will be given at the office.  [facility] Address:  [address]  [place],  [address]  

## 2021-11-05 NOTE — Anesthesia Postprocedure Evaluation (Signed)
Anesthesia Post Note  Patient: Clifford Jones  Procedure(s) Performed: CATARACT EXTRACTION PHACO AND INTRAOCULAR LENS PLACEMENT (IOC) (Right: Eye)  Patient location during evaluation: Short Stay Anesthesia Type: MAC Level of consciousness: awake and alert Pain management: pain level controlled Vital Signs Assessment: post-procedure vital signs reviewed and stable Respiratory status: spontaneous breathing Cardiovascular status: blood pressure returned to baseline Postop Assessment: no apparent nausea or vomiting Anesthetic complications: no   No notable events documented.   Last Vitals:  Vitals:   11/05/21 0914 11/05/21 1052  BP: (!) 141/74 (!) 153/72  Pulse: (!) 57 67  Resp: 17 17  Temp: 36.8 C (!) 36.1 C  SpO2: 95% 97%    Last Pain:  Vitals:   11/05/21 1052  TempSrc: Oral  PainSc:                  Tressie Stalker

## 2021-11-05 NOTE — Op Note (Signed)
Date of procedure: 11/05/21  Pre-operative diagnosis: Visually significant age-related nuclear cataract, Right Eye; Poor Dilation, Right Eye (H25.11), Regular astigmatism, right eye  Post-operative diagnosis: Visually significant age-related cataract, Right Eye; Intra-operative Floppy Iris Syndrome, Right Eye (H21.81), Regular astigmatism, right eye  Procedure: Removal of cataract via phacoemulsification and insertion of intra-ocular lens Wynetta Emery and Johnson [id number] +17.5D into the capsular bag of the Right Eye (CPT [phone])  Attending surgeon: Gerda Diss. Shaney Deckman, MD, MA  Anesthesia: MAC, Topical Akten  Complications: None  Estimated Blood Loss: <21m (minimal)  Specimens: None  Implants: As above  Indications:  Visually significant cataract, Right Eye  Procedure:  The patient was seen and identified in the pre-operative area. The operative eye was identified and dilated.  The operative eye was marked at 180 and 270.  Topical anesthesia was administered to the operative eye.     The patient was then to the operative suite and placed in the supine position.  A timeout was performed confirming the patient, procedure to be performed, and all other relevant information.   The patient's face was prepped and draped in the usual fashion for intra-ocular surgery.  A lid speculum was placed into the operative eye and the surgical microscope moved into place and focused.  Poor dilation of the iris was confirmed.  A superotemporal paracentesis was created using a 20 gauge paracentesis blade.  Shugarcaine was injected into the anterior chamber.  Viscoelastic was injected into the anterior chamber.  A temporal clear-corneal main wound incision was created using a 2.466mmicrokeratome.  A Malyugin ring was placed.  A continuous curvilinear capsulorrhexis was initiated using an irrigating cystitome and completed using capsulorrhexis forceps.  Hydrodissection and hydrodeliniation were performed.  Viscoelastic was  injected into the anterior chamber.  A phacoemulsification handpiece and a chopper as a second instrument were used to remove the nucleus and epinucleus. The irrigation/aspiration handpiece was used to remove any remaining cortical material.   The capsular bag was reinflated with viscoelastic, checked, and found to be intact.  The intraocular lens was inserted into the capsular bag and dialed into place using a Malyugin Ring Manipulator at the correct meridian (1 degree).  The Malyugin ring was removed.  The irrigation/aspiration handpiece was used to remove any remaining viscoelastic.  The clear corneal wound and paracentesis wounds were then hydrated and checked with Weck-Cels to be watertight.  The lid-speculum and drape was removed, and the patient's face was cleaned with a wet and dry 4x4.  Maxitrol was instilled in the eye. A clear shield was taped over the eye. The patient was taken to the post-operative care unit in good condition, having tolerated the procedure well.  Post-Op Instructions: [facility] and will receive all other orders and instructions.

## 2021-11-08 ENCOUNTER — Encounter (HOSPITAL_COMMUNITY): Payer: Self-pay | Admitting: Ophthalmology

## 2021-11-08 NOTE — Progress Notes (Signed)
11/08/21 1255 pt called and stated that he had received a phone call from here today. Returned call. Stated that he was doing well. No problems.

## 2021-11-16 ENCOUNTER — Ambulatory Visit (INDEPENDENT_AMBULATORY_CARE_PROVIDER_SITE_OTHER): Payer: Medicare HMO | Admitting: *Deleted

## 2021-11-16 DIAGNOSIS — Z5181 Encounter for therapeutic drug level monitoring: Secondary | ICD-10-CM

## 2021-11-16 DIAGNOSIS — I513 Intracardiac thrombosis, not elsewhere classified: Secondary | ICD-10-CM | POA: Diagnosis not present

## 2021-11-16 LAB — POCT INR: INR: 2.2 (ref 2.0–3.0)

## 2021-11-16 NOTE — Patient Instructions (Signed)
Continue warfarin 1 tablet daily except 1/2 tablet on Mondays and Thursdays Recheck INR in 6 weeks.  

## 2021-12-02 DIAGNOSIS — H353113 Nonexudative age-related macular degeneration, right eye, advanced atrophic without subfoveal involvement: Secondary | ICD-10-CM | POA: Diagnosis not present

## 2021-12-02 DIAGNOSIS — H353124 Nonexudative age-related macular degeneration, left eye, advanced atrophic with subfoveal involvement: Secondary | ICD-10-CM | POA: Diagnosis not present

## 2021-12-02 DIAGNOSIS — H43813 Vitreous degeneration, bilateral: Secondary | ICD-10-CM | POA: Diagnosis not present

## 2021-12-02 DIAGNOSIS — H2512 Age-related nuclear cataract, left eye: Secondary | ICD-10-CM | POA: Diagnosis not present

## 2021-12-07 DIAGNOSIS — Z0100 Encounter for examination of eyes and vision without abnormal findings: Secondary | ICD-10-CM | POA: Diagnosis not present

## 2021-12-09 DIAGNOSIS — H43813 Vitreous degeneration, bilateral: Secondary | ICD-10-CM | POA: Diagnosis not present

## 2021-12-09 DIAGNOSIS — H2512 Age-related nuclear cataract, left eye: Secondary | ICD-10-CM | POA: Diagnosis not present

## 2021-12-09 DIAGNOSIS — H353113 Nonexudative age-related macular degeneration, right eye, advanced atrophic without subfoveal involvement: Secondary | ICD-10-CM | POA: Diagnosis not present

## 2021-12-09 DIAGNOSIS — H353124 Nonexudative age-related macular degeneration, left eye, advanced atrophic with subfoveal involvement: Secondary | ICD-10-CM | POA: Diagnosis not present

## 2021-12-28 ENCOUNTER — Ambulatory Visit (INDEPENDENT_AMBULATORY_CARE_PROVIDER_SITE_OTHER): Payer: Medicare HMO | Admitting: *Deleted

## 2021-12-28 DIAGNOSIS — I513 Intracardiac thrombosis, not elsewhere classified: Secondary | ICD-10-CM

## 2021-12-28 DIAGNOSIS — Z5181 Encounter for therapeutic drug level monitoring: Secondary | ICD-10-CM

## 2021-12-28 LAB — POCT INR: INR: 2.8 (ref 2.0–3.0)

## 2021-12-28 NOTE — Patient Instructions (Signed)
Continue warfarin 1 tablet daily except 1/2 tablet on Mondays and Thursdays Recheck INR in 6 weeks.

## 2022-01-06 ENCOUNTER — Other Ambulatory Visit: Payer: Self-pay | Admitting: Cardiology

## 2022-01-06 NOTE — Telephone Encounter (Signed)
Received refill request for warfarin:  Last INR was 2.8 on 12/28/21 Next INR is due on 02/08/22 LOV was 11/16/21  Myles Gip MD  Refill approved.

## 2022-02-03 DIAGNOSIS — H353124 Nonexudative age-related macular degeneration, left eye, advanced atrophic with subfoveal involvement: Secondary | ICD-10-CM | POA: Diagnosis not present

## 2022-02-03 DIAGNOSIS — H2512 Age-related nuclear cataract, left eye: Secondary | ICD-10-CM | POA: Diagnosis not present

## 2022-02-03 DIAGNOSIS — H353113 Nonexudative age-related macular degeneration, right eye, advanced atrophic without subfoveal involvement: Secondary | ICD-10-CM | POA: Diagnosis not present

## 2022-02-03 DIAGNOSIS — H43813 Vitreous degeneration, bilateral: Secondary | ICD-10-CM | POA: Diagnosis not present

## 2022-02-08 ENCOUNTER — Ambulatory Visit (INDEPENDENT_AMBULATORY_CARE_PROVIDER_SITE_OTHER): Payer: Medicare HMO | Admitting: *Deleted

## 2022-02-08 DIAGNOSIS — Z5181 Encounter for therapeutic drug level monitoring: Secondary | ICD-10-CM | POA: Diagnosis not present

## 2022-02-08 DIAGNOSIS — I513 Intracardiac thrombosis, not elsewhere classified: Secondary | ICD-10-CM | POA: Diagnosis not present

## 2022-02-08 LAB — POCT INR: INR: 3.4 — AB (ref 2.0–3.0)

## 2022-02-08 NOTE — Patient Instructions (Signed)
Hold warfarin tonight then resume 1 tablet daily except 1/2 tablet on Mondays and Thursdays Recheck INR in 6 weeks.

## 2022-02-09 DIAGNOSIS — H5213 Myopia, bilateral: Secondary | ICD-10-CM | POA: Diagnosis not present

## 2022-02-14 ENCOUNTER — Encounter: Payer: Self-pay | Admitting: Cardiology

## 2022-02-14 ENCOUNTER — Ambulatory Visit: Payer: Medicare HMO | Attending: Cardiology | Admitting: Cardiology

## 2022-02-14 VITALS — BP 132/80 | HR 56 | Ht 72.0 in | Wt 241.6 lb

## 2022-02-14 DIAGNOSIS — I513 Intracardiac thrombosis, not elsewhere classified: Secondary | ICD-10-CM

## 2022-02-14 DIAGNOSIS — I2510 Atherosclerotic heart disease of native coronary artery without angina pectoris: Secondary | ICD-10-CM | POA: Diagnosis not present

## 2022-02-14 DIAGNOSIS — R600 Localized edema: Secondary | ICD-10-CM

## 2022-02-14 DIAGNOSIS — I10 Essential (primary) hypertension: Secondary | ICD-10-CM | POA: Diagnosis not present

## 2022-02-14 NOTE — Patient Instructions (Signed)
Medication Instructions:  Continue all current medications.   Labwork: none  Testing/Procedures: none  Follow-Up: 6 months   Any Other Special Instructions Will Be Listed Below (If Applicable).   If you need a refill on your cardiac medications before your next appointment, please call your pharmacy.  

## 2022-02-14 NOTE — Progress Notes (Signed)
Clinical Summary Mr. Mihelich is a 71 y.o.male seen today for follow up of the following medical problems.     1. CAD - history of CABG in 2003 -  h/o non-STEMI and PCI with DES to the native RPLA via the [place] graft. He has residual VG->OM, native OM, distal LAD, and diagonal disease, which is being medically managed.         -NSTEMI 11/2019 - Cardiac catheterization performed on 12/16/2019 showed 80% distal left main with heavy calcification involving the ostium of the left circumflex up to 90%, chronic total occlusion of mid and distal LAD, 80% D1 stenosis, chronic total occlusion of ramus intermedius, distal left circumflex and proximal RCA, widely patent LIMA to LAD and patent RIMA to distal RCA, chronically occluded sequential SVG to D1 and D2, chronically occluded SVG to OM.  SVG to ramus was unable to be engaged however likely occluded RCA was not seen on the ascending aortogram.  Postprocedure, medical therapy was recommended If patient has any refractory angiogram, only interventional target would be distal left main although this will be very challenging given the heavy calcification and severe stenosis in the ostial left circumflex artery.  PCI of D1 could also be considered at the time   07/2020 echo LVEF 50-55%, sluggish apical flow without thrombus   -beta blocker dosing limited due to bradycardia   - no chest pains, no SOB/DOE - compliant with meds     2. LE edema - no recent issues - has lasix prn, does not often.    3. HTN -- he reports a component of white coat HTN - home bp's 130s/80s   4. Hyperlipidemia - compliant with statin - labs followed by pcp   11/2020 TC 162 TG 263 HDL 45 LDL 74 - 08/2021 TC 149 TG 357 HDL 47 LDL 49     5. Ampullary adenoma - noted by recent EGD, s/p ERCP and ampullectomy.        6.LV thrombus Echocardiogram performed on 12/15/2019 showed a small apical LV thrombus measuring 6 x 6 mm, EF 50 to 55%, grade 2 DD, RVSP 28.6 mmHg.      07/2020 ehco: LVEF 50-55%, no thrombus though sluggish apical flow - no bleeding on coumadin.   7. AAA repair - around 2006 Past Medical History:  Diagnosis Date   Ampullary adenoma    Arthritis    Reported by patient   Cancer [facility])    Reported by patient   Claustrophobia    Closed fracture of right distal tibia    COPD (chronic obstructive pulmonary disease) ([place])    Reported by patient   Coronary artery disease    a. 1999 Inf MI;  b. 2003 CABGx6;  c. 2008 NSTEMI, occluded SVG to marginal and diagonal system. 2 DES placed to marginal system;  c. NSTEMI - 02/2012 Xience Xpedition DES to oS-OM and POBA to mid lesion;  c. 10/2013 NSTEMI/PCI: VG->OM 80 ISR, native OM 80/90 small, LIMA->LAD ok w/ 90 dLAD, D1 80, RIMA->RPL ok w/ 95 in RPL (2.5x14 Resolute DES), nl EF.   Dyspnea    Reported by patient   GERD (gastroesophageal reflux disease)    Hyperlipidemia    Hypertension    Myocardial infarction ([place])    X 6   Pneumonia    Presence of permanent cardiac pacemaker    S/P AAA repair    Tobacco abuse    Type 2 diabetes mellitus without complication, without long-term current use  of insulin ([place])    Wears dentures    Wears glasses      No Known Allergies   Current Outpatient Medications  Medication Sig Dispense Refill   acetaminophen (TYLENOL) 500 MG tablet Take 1,000 mg by mouth every 6 (six) hours as needed for mild pain, moderate pain or headache.      albuterol (PROVENTIL) (2.5 MG/3ML) 0.083% nebulizer solution Take 2.5 mg by nebulization every 4 (four) hours as needed for wheezing or shortness of breath.     amLODipine (NORVASC) 5 MG tablet TAKE 1 TABLET BY MOUTH DAILY 30 tablet 3   ascorbic acid (VITAMIN C) 500 MG tablet Take 1 tablet (500 mg total) by mouth daily. 30 tablet 1   aspirin 81 MG EC tablet Take 1 tablet (81 mg total) by mouth daily. 30 tablet 11   carvedilol (COREG) 3.125 MG tablet TAKE 1 TABLET BY MOUTH TWICE DAILY 60 tablet 3   furosemide (LASIX) 20 MG  tablet TAKE 1 TABLET BY MOUTH EVERY DAY 90 tablet 1   guaiFENesin-dextromethorphan (ROBITUSSIN DM) 100-10 MG/5ML syrup Take 10 mLs by mouth every 4 (four) hours as needed for cough. 118 mL 0   ipratropium-albuterol (DUONEB) 0.5-2.5 (3) MG/3ML SOLN Inhale 3 mLs into the lungs every 6 (six) hours. (Patient taking differently: Inhale 3 mLs into the lungs every 6 (six) hours as needed (for wheezing or shortness of breath).) 360 mL 3   isosorbide mononitrate (IMDUR) 30 MG 24 hr tablet Take 1 tablet (30 mg total) by mouth daily. 90 tablet 1   lisinopril (ZESTRIL) 2.5 MG tablet Take 1 tablet (2.5 mg total) by mouth daily. 60 tablet 1   magnesium hydroxide (MILK OF MAGNESIA) 400 MG/5ML suspension Take 15 mLs by mouth daily as needed for mild constipation. 355 mL 0   metFORMIN (GLUCOPHAGE XR) 750 MG 24 hr tablet Take 1 tablet (750 mg total) by mouth daily with breakfast. (Patient not taking: Reported on 08/11/2021) 30 tablet 11   nitroGLYCERIN (NITROSTAT) 0.4 MG SL tablet Place 1 tablet (0.4 mg total) under the tongue every 5 (five) minutes x 3 doses as needed for chest pain. 25 tablet 4   pantoprazole (PROTONIX) 40 MG tablet TAKE 1 TABLET BY MOUTH TWICE DAILY (Patient taking differently: Take 40 mg by mouth 2 (two) times daily.) 60 tablet 3   RANEXA 1000 MG SR tablet TAKE ONE TABLET BY MOUTH TWICE DAILY 30 tablet 0   rosuvastatin (CRESTOR) 40 MG tablet Take 1 tablet (40 mg total) by mouth at bedtime. 90 tablet 1   vitamin B-12 (CYANOCOBALAMIN) 1000 MCG tablet Take 1,000 mcg by mouth daily.     warfarin (COUMADIN) 2 MG tablet TAKE 1 TABLET BY MOUTH DAILY EXCEPT 1/2 TABLET ON MONDAYS AND THURSDAYS OR AS DIRECTED 30 tablet 5   zinc gluconate 50 MG tablet Take 50 mg by mouth daily.     No current facility-administered medications for this visit.     Past Surgical History:  Procedure Laterality Date   ABDOMINAL AORTIC ANEURYSM REPAIR  2006   APPENDECTOMY     BIOPSY  03/08/2021   Procedure: BIOPSY;  Surgeon:  Irving Copas., MD;  Location[facility];  Service: Gastroenterology;;   CATARACT EXTRACTION W/PHACO Right 11/05/2021   Procedure: CATARACT EXTRACTION PHACO AND INTRAOCULAR LENS PLACEMENT ([place]);  Surgeon: Baruch Goldmann, MD;  Location: [facility];  Service: Ophthalmology;  Laterality: Right;  CDE: 11.33   CORONARY ANGIOPLASTY WITH STENT PLACEMENT     CORONARY ARTERY BYPASS  GRAFT  2003   ENDOSCOPIC RETROGRADE CHOLANGIOPANCREATOGRAPHY (ERCP) WITH PROPOFOL N/A 03/25/2019   Procedure: ENDOSCOPIC RETROGRADE CHOLANGIOPANCREATOGRAPHY (ERCP) WITH PROPOFOL;  Surgeon: Rush Landmark Telford Nab., MD;  Location: [place];  Service: Gastroenterology;  Laterality: N/A;   ENDOSCOPIC RETROGRADE CHOLANGIOPANCREATOGRAPHY (ERCP) WITH PROPOFOL N/A 03/08/2021   Procedure: ENDOSCOPIC RETROGRADE CHOLANGIOPANCREATOGRAPHY (ERCP) WITH PROPOFOL;  Surgeon: Rush Landmark Telford Nab., MD;  Location: [facility];  Service: Gastroenterology;  Laterality: N/A;   ESOPHAGEAL DILATION  07/06/2018   Procedure: ESOPHAGEAL DILATION;  Surgeon: Rogene Houston, MD;  [facility];  Service: Endoscopy;;   ESOPHAGOGASTRODUODENOSCOPY N/A 07/06/2018   Procedure: ESOPHAGOGASTRODUODENOSCOPY (EGD);  Surgeon: Rogene Houston, MD;  [facility];  Service: Endoscopy;  Laterality: N/A;   ESOPHAGOGASTRODUODENOSCOPY N/A 08/16/2018   Procedure: ESOPHAGOGASTRODUODENOSCOPY (EGD);  Surgeon: Milus Banister, MD;  [facility];  Service: Endoscopy;  Laterality: N/A;   ESOPHAGOGASTRODUODENOSCOPY (EGD) WITH PROPOFOL N/A 03/25/2019   Procedure: ESOPHAGOGASTRODUODENOSCOPY (EGD) WITH PROPOFOL;  Surgeon: Rush Landmark Telford Nab., MD;  Location: [place];  Service: Gastroenterology;  Laterality: N/A;   EUS N/A 08/16/2018   Procedure: UPPER ENDOSCOPIC ULTRASOUND (EUS) RADIAL;  Surgeon: Milus Banister, MD;  Location: [facility];  Service: Endoscopy;  Laterality: N/A;   EUS N/A 03/25/2019   Procedure: UPPER ENDOSCOPIC  ULTRASOUND (EUS) RADIAL;  Surgeon: Irving Copas., MD;  Location: [place];  Service: Gastroenterology;  Laterality: N/A;   FOREIGN BODY REMOVAL  07/06/2018   Procedure: FOREIGN BODY REMOVAL;  Surgeon: Rogene Houston, MD;  [facility];  Service: Endoscopy;;   HERNIA REPAIR     umbicical   LEFT HEART CATHETERIZATION WITH CORONARY ANGIOGRAM N/A 03/05/2012   Procedure: LEFT HEART CATHETERIZATION WITH CORONARY ANGIOGRAM;  Surgeon: Hillary Bow, MD;  Location: [facility] C[facility] LAB;  Service: Cardiovascular;  Laterality: N/A;   LEFT HEART CATHETERIZATION WITH CORONARY ANGIOGRAM N/A 10/23/2013   Procedure: LEFT HEART CATHETERIZATION WITH CORONARY ANGIOGRAM;  Surgeon: Blane Ohara, MD;  Location: [facility] CATH LAB;  Service: Cardiovascular;  Laterality: N/A;   LEFT HEART CATHETERIZATION WITH CORONARY/GRAFT ANGIOGRAM  10/22/2013   Procedure: LEFT HEART CATHETERIZATION WITH Beatrix Fetters;  Surgeon: Leonie Man, MD;  Location: [facility] CATH LAB;  Service: Cardiovascular;;   PANCREATIC STENT PLACEMENT  03/25/2019   Procedure: PANCREATIC STENT PLACEMENT;  Surgeon: Rush Landmark Telford Nab., MD;  Location: [place];  Service: Gastroenterology;;   PANCREATIC STENT PLACEMENT  03/08/2021   Procedure: PANCREATIC STENT PLACEMENT;  Surgeon: Irving Copas., MD;  Location: [facility];  Service: Gastroenterology;;   PERCUTANEOUS CORONARY STENT INTERVENTION (PCI-S) N/A 03/07/2012   Procedure: PERCUTANEOUS CORONARY STENT INTERVENTION (PCI-S);  Surgeon: Sherren Mocha, MD;  Location[facility] CATH LAB;  Service: Cardiovascular;  Laterality: N/A;   PERCUTANEOUS CORONARY STENT INTERVENTION (PCI-S) Right 10/23/2013   Procedure: PERCUTANEOUS CORONARY STENT INTERVENTION (PCI-S);  Surgeon: Blane Ohara, MD;  Location: [facility] CATH LAB;  Service: Cardiovascular;  Laterality: Right;   POLYPECTOMY  07/06/2018   Procedure: POLYPECTOMY;  Surgeon: Rogene Houston, MD;  [facility];  Service:  Endoscopy;;  polyp at ampulla   POLYPECTOMY  03/25/2019   Procedure: AMPULLECTOMY;  Surgeon: Mansouraty, Telford Nab., MD;  Location: [place];  Service: Gastroenterology;;   REMOVAL OF STONES  03/08/2021   Procedure: REMOVAL OF STONES;  Surgeon: Irving Copas., MD;  Location[facility];  Service: Gastroenterology;;   Right hand surgery     RIGHT HEART CATH AND CORONARY/GRAFT ANGIOGRAPHY N/A 12/16/2019   Procedure: RIGHT HEART CATH  AND CORONARY/GRAFT ANGIOGRAPHY;  Surgeon: Nelva Bush, MD;  Location: [place] CV LAB;  Service: Cardiovascular;  Laterality: N/A;   SPHINCTEROTOMY  03/25/2019   Procedure: SPHINCTEROTOMY;  Surgeon: Mansouraty, Telford Nab., MD;  Location: [place];  Service: Gastroenterology;;   TIBIA IM NAIL INSERTION Right 04/07/2016   Procedure: FIXATION OF RIGHT TIBIA FRACTURE;  Surgeon: Garald Balding, MD;  Location: [place];  Service: Orthopedics;  Laterality: Right;     No Known Allergies    Family History  Problem Relation Age of Onset   Coronary artery disease Other    Colon cancer Neg Hx    Esophageal cancer Neg Hx    Inflammatory bowel disease Neg Hx    Liver disease Neg Hx    Pancreatic cancer Neg Hx    Rectal cancer Neg Hx    Stomach cancer Neg Hx      Social History Mr. Eick reports that he has been smoking cigarettes. He started smoking about 57 years ago. He has a 54.00 pack-year smoking history. He has never used smokeless tobacco. Mr. Polgar reports current alcohol use.   Review of Systems CONSTITUTIONAL: No weight loss, fever, chills, weakness or fatigue.  HEENT: Eyes: No visual loss, blurred vision, double vision or yellow sclerae.No hearing loss, sneezing, congestion, runny nose or sore throat.  SKIN: No rash or itching.  CARDIOVASCULAR: per hpi RESPIRATORY: No shortness of breath, cough or sputum.  GASTROINTESTINAL: No anorexia, nausea, vomiting or diarrhea. No abdominal pain or blood.  GENITOURINARY: No burning on  urination, no polyuria NEUROLOGICAL: No headache, dizziness, syncope, paralysis, ataxia, numbness or tingling in the extremities. No change in bowel or bladder control.  MUSCULOSKELETAL: No muscle, back pain, joint pain or stiffness.  LYMPHATICS: No enlarged nodes. No history of splenectomy.  PSYCHIATRIC: No history of depression or anxiety.  ENDOCRINOLOGIC: No reports of sweating, cold or heat intolerance. No polyuria or polydipsia.  Marland Kitchen   Physical Examination Today's Vitals   02/14/22 1016  BP: 132/80  Pulse: (!) 56  SpO2: 91%  Weight: 241 lb 9.6 oz (109.6 kg)  Height: 6' (1.829 m)   Body mass index is 32.77 kg/m.  Gen: resting comfortably, no acute distress HEENT: no scleral icterus, pupils equal round and reactive, no palptable cervical adenopathy,  CV: RRR, no m/r/g no jvd Resp: Clear to auscultation bilaterally GI: abdomen is soft, non-tender, non-distended, normal bowel sounds, no hepatosplenomegaly MSK: extremities are warm, no edema.  Skin: warm, no rash Neuro:  no focal deficits Psych: appropriate affect   Diagnostic Studies 02/2015 echo Study Conclusions   - Left ventricle: The cavity size was normal. There was mild   concentric hypertrophy. Systolic function was normal. The   estimated ejection fraction was in the range of 55% to 60%.   Possible hypokinesis of the basalinferior myocardium. Marked   bradycardia limits evaluation of LV diastolic function. - Aortic valve: There was trivial regurgitation. - Left atrium: The atrium was moderately dilated. - Right ventricle: Systolic function was mildly reduced.     02/2015 nuclear stress There was no ST segment deviation noted during stress. T wave inversion of 1 mm was noted during stress in the I, II, aVF, V5 and V6 leads. T wave inversion persisted. Defect 1: There is a medium defect of severe severity present in the apical anterior, apical inferior and apex location. Findings consistent with prior myocardial  infarction. There is no evidence of ischemia. This is an intermediate risk study. Nuclear stress EF: 39%.  Assessment and Plan  1. CAD - no symptoms, continue current meds - severe history of CAD/extensive disease, have continued ASA while on coumadin in this setting.    2. LE edema - well controlled, continue prn lasix   3.LV thrombus - 07/2020 echo show resolution, but apical sluggish flow, high risk of recurrence - we will continue coumadin   4. HTN - at goal, continue current meds   5. Hyperlipidemia - LDL at goal, discussed dietary changes to improve TGs.       Arnoldo Lenis, M.D.

## 2022-02-16 ENCOUNTER — Telehealth: Payer: Self-pay

## 2022-02-16 NOTE — Telephone Encounter (Signed)
Pt is due in September for ERCP on coumadin per recall for ampullary adenoma  Left message on machine to call back

## 2022-02-24 NOTE — Telephone Encounter (Signed)
Left message on machine to call back  

## 2022-02-25 ENCOUNTER — Other Ambulatory Visit: Payer: Self-pay

## 2022-02-25 ENCOUNTER — Telehealth: Payer: Self-pay

## 2022-02-25 DIAGNOSIS — D135 Benign neoplasm of extrahepatic bile ducts: Secondary | ICD-10-CM

## 2022-02-25 NOTE — Telephone Encounter (Signed)
[facility] Pre-operative Risk Assessment     Request for surgical clearance:     Endoscopy Procedure  What type of surgery is being performed?     ERCP   When is this surgery scheduled?     04/14/22  What type of clearance is required ?   Pharmacy  Are there any medications that need to be held prior to surgery and how long? Coumadin  Practice name and name of physician performing surgery?      [facility]  What is your office phone and fax number?      Phone- [phone]  [phone]  Anesthesia type (None, local, MAC, general) ?       MAC

## 2022-02-25 NOTE — Telephone Encounter (Signed)
ERCP scheduled, pt instructed and medications reviewed.  Patient instructions mailed to home.  Patient to call with any questions or concerns. Anti coag letter sent for coumadin hold

## 2022-02-27 ENCOUNTER — Other Ambulatory Visit: Payer: Self-pay | Admitting: Cardiology

## 2022-03-01 NOTE — Telephone Encounter (Signed)
Dr. Harl Bowie, you recently saw this patient on 02/14/2022 and he is now pending ERCP scheduled for 04/14/2022.  Do you agree that he may proceed without further cardiac testing?  Please route your response to p cv div preop.  Thank you, Emmaline Life, NP-C  03/01/2022, 9:18 AM [address] Office [phone] Fax [phone]

## 2022-03-02 NOTE — Telephone Encounter (Signed)
Patient with diagnosis of left ventricular mural thrombus on warfarin for anticoagulation.    Procedure: ERCP Date of procedure: 04/14/22  Echo 6/21 indicates possible small apical LV thrombus, was not noted in repeat echo 2/23  CrCl 133 Platelet count 235  Patient should be fine to hold warfarin x 5 days without bridge for ECRP, but will confirm with MD   **This guidance is not considered finalized until pre-operative APP has relayed final recommendations.**

## 2022-03-02 NOTE — Telephone Encounter (Signed)
Ok not to bridge  Zandra Abts MD

## 2022-03-03 NOTE — Telephone Encounter (Signed)
Covering preop today. Dr. Harl Bowie already weighed in below and GI has relayed recs to the patient. Will remove from preop box.

## 2022-03-03 NOTE — Telephone Encounter (Signed)
The pt has been advised to stop warfarin 5 days prior to the procedure.

## 2022-03-14 DIAGNOSIS — I5032 Chronic diastolic (congestive) heart failure: Secondary | ICD-10-CM | POA: Diagnosis not present

## 2022-03-14 DIAGNOSIS — R739 Hyperglycemia, unspecified: Secondary | ICD-10-CM | POA: Diagnosis not present

## 2022-03-14 DIAGNOSIS — Z955 Presence of coronary angioplasty implant and graft: Secondary | ICD-10-CM | POA: Diagnosis not present

## 2022-03-14 DIAGNOSIS — Z23 Encounter for immunization: Secondary | ICD-10-CM | POA: Diagnosis not present

## 2022-03-14 DIAGNOSIS — E7849 Other hyperlipidemia: Secondary | ICD-10-CM | POA: Diagnosis not present

## 2022-03-14 DIAGNOSIS — J449 Chronic obstructive pulmonary disease, unspecified: Secondary | ICD-10-CM | POA: Diagnosis not present

## 2022-03-14 DIAGNOSIS — I110 Hypertensive heart disease with heart failure: Secondary | ICD-10-CM | POA: Diagnosis not present

## 2022-03-14 DIAGNOSIS — F419 Anxiety disorder, unspecified: Secondary | ICD-10-CM | POA: Diagnosis not present

## 2022-03-14 DIAGNOSIS — I10 Essential (primary) hypertension: Secondary | ICD-10-CM | POA: Diagnosis not present

## 2022-03-14 DIAGNOSIS — Z6833 Body mass index (BMI) 33.0-33.9, adult: Secondary | ICD-10-CM | POA: Diagnosis not present

## 2022-03-14 DIAGNOSIS — K2100 Gastro-esophageal reflux disease with esophagitis, without bleeding: Secondary | ICD-10-CM | POA: Diagnosis not present

## 2022-03-22 ENCOUNTER — Ambulatory Visit: Payer: Medicare HMO | Attending: Family Medicine | Admitting: *Deleted

## 2022-03-22 DIAGNOSIS — Z5181 Encounter for therapeutic drug level monitoring: Secondary | ICD-10-CM | POA: Diagnosis not present

## 2022-03-22 DIAGNOSIS — I513 Intracardiac thrombosis, not elsewhere classified: Secondary | ICD-10-CM

## 2022-03-22 LAB — POCT INR: INR: 2.5 (ref 2.0–3.0)

## 2022-03-22 NOTE — Patient Instructions (Signed)
Continue warfarin 1 tablet daily except 1/2 tablet on Mondays and Thursdays Pending ERCP on 10/26.  Will hold warfarin 5 days before procedure and resume night of procedure if OK with MD Recheck INR in 7 - 10 days after procedure.

## 2022-03-31 DIAGNOSIS — H2512 Age-related nuclear cataract, left eye: Secondary | ICD-10-CM | POA: Diagnosis not present

## 2022-03-31 DIAGNOSIS — H353113 Nonexudative age-related macular degeneration, right eye, advanced atrophic without subfoveal involvement: Secondary | ICD-10-CM | POA: Diagnosis not present

## 2022-03-31 DIAGNOSIS — H43813 Vitreous degeneration, bilateral: Secondary | ICD-10-CM | POA: Diagnosis not present

## 2022-03-31 DIAGNOSIS — H353124 Nonexudative age-related macular degeneration, left eye, advanced atrophic with subfoveal involvement: Secondary | ICD-10-CM | POA: Diagnosis not present

## 2022-04-06 ENCOUNTER — Encounter (HOSPITAL_COMMUNITY): Payer: Self-pay | Admitting: Gastroenterology

## 2022-04-07 ENCOUNTER — Encounter (HOSPITAL_COMMUNITY): Payer: Self-pay | Admitting: Gastroenterology

## 2022-04-14 ENCOUNTER — Ambulatory Visit (HOSPITAL_COMMUNITY): Payer: Medicare HMO

## 2022-04-14 ENCOUNTER — Telehealth: Payer: Self-pay

## 2022-04-14 ENCOUNTER — Other Ambulatory Visit: Payer: Self-pay

## 2022-04-14 ENCOUNTER — Ambulatory Visit (HOSPITAL_COMMUNITY)
Admission: RE | Admit: 2022-04-14 | Discharge: 2022-04-14 | Disposition: A | Discharge status: Home / Self Care | Payer: Medicare HMO | Attending: Gastroenterology | Admitting: Gastroenterology

## 2022-04-14 ENCOUNTER — Ambulatory Visit (HOSPITAL_BASED_OUTPATIENT_CLINIC_OR_DEPARTMENT_OTHER): Payer: Medicare HMO | Admitting: Certified Registered Nurse Anesthetist

## 2022-04-14 ENCOUNTER — Encounter (HOSPITAL_COMMUNITY)
Admission: RE | Disposition: A | Discharge status: Home / Self Care | Payer: Self-pay | Source: Home / Self Care | Attending: Gastroenterology

## 2022-04-14 ENCOUNTER — Ambulatory Visit (HOSPITAL_COMMUNITY): Payer: Medicare HMO | Admitting: Certified Registered Nurse Anesthetist

## 2022-04-14 ENCOUNTER — Encounter (HOSPITAL_COMMUNITY): Payer: Self-pay | Admitting: Gastroenterology

## 2022-04-14 DIAGNOSIS — Z9889 Other specified postprocedural states: Secondary | ICD-10-CM | POA: Insufficient documentation

## 2022-04-14 DIAGNOSIS — Z7984 Long term (current) use of oral hypoglycemic drugs: Secondary | ICD-10-CM | POA: Insufficient documentation

## 2022-04-14 DIAGNOSIS — I252 Old myocardial infarction: Secondary | ICD-10-CM | POA: Diagnosis not present

## 2022-04-14 DIAGNOSIS — I10 Essential (primary) hypertension: Secondary | ICD-10-CM | POA: Diagnosis not present

## 2022-04-14 DIAGNOSIS — D135 Benign neoplasm of extrahepatic bile ducts: Secondary | ICD-10-CM

## 2022-04-14 DIAGNOSIS — Z7901 Long term (current) use of anticoagulants: Secondary | ICD-10-CM | POA: Diagnosis not present

## 2022-04-14 DIAGNOSIS — Z4659 Encounter for fitting and adjustment of other gastrointestinal appliance and device: Secondary | ICD-10-CM

## 2022-04-14 DIAGNOSIS — F17210 Nicotine dependence, cigarettes, uncomplicated: Secondary | ICD-10-CM | POA: Insufficient documentation

## 2022-04-14 DIAGNOSIS — K219 Gastro-esophageal reflux disease without esophagitis: Secondary | ICD-10-CM | POA: Diagnosis not present

## 2022-04-14 DIAGNOSIS — F419 Anxiety disorder, unspecified: Secondary | ICD-10-CM | POA: Diagnosis not present

## 2022-04-14 DIAGNOSIS — Z951 Presence of aortocoronary bypass graft: Secondary | ICD-10-CM | POA: Diagnosis not present

## 2022-04-14 DIAGNOSIS — E1151 Type 2 diabetes mellitus with diabetic peripheral angiopathy without gangrene: Secondary | ICD-10-CM | POA: Diagnosis not present

## 2022-04-14 DIAGNOSIS — Z87891 Personal history of nicotine dependence: Secondary | ICD-10-CM | POA: Diagnosis not present

## 2022-04-14 DIAGNOSIS — I2510 Atherosclerotic heart disease of native coronary artery without angina pectoris: Secondary | ICD-10-CM | POA: Diagnosis not present

## 2022-04-14 DIAGNOSIS — J449 Chronic obstructive pulmonary disease, unspecified: Secondary | ICD-10-CM | POA: Diagnosis not present

## 2022-04-14 DIAGNOSIS — Z6831 Body mass index (BMI) 31.0-31.9, adult: Secondary | ICD-10-CM | POA: Insufficient documentation

## 2022-04-14 DIAGNOSIS — E669 Obesity, unspecified: Secondary | ICD-10-CM | POA: Diagnosis not present

## 2022-04-14 DIAGNOSIS — Z9689 Presence of other specified functional implants: Secondary | ICD-10-CM

## 2022-04-14 DIAGNOSIS — D132 Benign neoplasm of duodenum: Secondary | ICD-10-CM | POA: Diagnosis not present

## 2022-04-14 HISTORY — PX: ENDOSCOPIC RETROGRADE CHOLANGIOPANCREATOGRAPHY (ERCP) WITH PROPOFOL: SHX5810

## 2022-04-14 HISTORY — PX: PANCREATIC STENT PLACEMENT: SHX5539

## 2022-04-14 SURGERY — ENDOSCOPIC RETROGRADE CHOLANGIOPANCREATOGRAPHY (ERCP) WITH PROPOFOL
Anesthesia: General

## 2022-04-14 MED ORDER — DEXAMETHASONE SODIUM PHOSPHATE 10 MG/ML IJ SOLN
INTRAMUSCULAR | Status: DC | PRN
  Administered 2022-04-14: 10 mg via INTRAVENOUS

## 2022-04-14 MED ORDER — CIPROFLOXACIN IN D5W 400 MG/200ML IV SOLN
INTRAVENOUS | Status: AC
  Filled 2022-04-14: qty 200

## 2022-04-14 MED ORDER — SODIUM CHLORIDE 0.9 % IV SOLN: INTRAVENOUS | Status: DC

## 2022-04-14 MED ORDER — ROCURONIUM BROMIDE 10 MG/ML (PF) SYRINGE
PREFILLED_SYRINGE | INTRAVENOUS | Status: DC | PRN
  Administered 2022-04-14: 50 mg via INTRAVENOUS

## 2022-04-14 MED ORDER — ONDANSETRON HCL 4 MG/2ML IJ SOLN
INTRAMUSCULAR | Status: DC | PRN
  Administered 2022-04-14: 4 mg via INTRAVENOUS

## 2022-04-14 MED ORDER — LIDOCAINE 2% (20 MG/ML) 5 ML SYRINGE
INTRAMUSCULAR | Status: DC | PRN
  Administered 2022-04-14: 100 mg via INTRAVENOUS

## 2022-04-14 MED ORDER — DICLOFENAC SUPPOSITORY 100 MG
RECTAL | Status: DC | PRN
  Administered 2022-04-14: 100 mg via RECTAL

## 2022-04-14 MED ORDER — SUCCINYLCHOLINE CHLORIDE 200 MG/10ML IV SOSY
PREFILLED_SYRINGE | INTRAVENOUS | Status: DC | PRN
  Administered 2022-04-14: 120 mg via INTRAVENOUS

## 2022-04-14 MED ORDER — GLUCAGON HCL RDNA (DIAGNOSTIC) 1 MG IJ SOLR
INTRAMUSCULAR | Status: AC
  Filled 2022-04-14: qty 1

## 2022-04-14 MED ORDER — LACTATED RINGERS IV SOLN: INTRAVENOUS | Status: DC | PRN

## 2022-04-14 MED ORDER — FENTANYL CITRATE (PF) 100 MCG/2ML IJ SOLN
INTRAMUSCULAR | Status: DC | PRN
  Administered 2022-04-14: 100 ug via INTRAVENOUS

## 2022-04-14 MED ORDER — SUGAMMADEX SODIUM 200 MG/2ML IV SOLN
INTRAVENOUS | Status: DC | PRN
  Administered 2022-04-14: 300 mg via INTRAVENOUS
  Administered 2022-04-14: 200 mg via INTRAVENOUS

## 2022-04-14 MED ORDER — EPHEDRINE SULFATE-NACL 50-0.9 MG/10ML-% IV SOSY
PREFILLED_SYRINGE | INTRAVENOUS | Status: DC | PRN
  Administered 2022-04-14: 10 mg via INTRAVENOUS
  Administered 2022-04-14: 15 mg via INTRAVENOUS
  Administered 2022-04-14: 10 mg via INTRAVENOUS

## 2022-04-14 MED ORDER — PROPOFOL 10 MG/ML IV BOLUS
INTRAVENOUS | Status: DC | PRN
  Administered 2022-04-14: 150 mg via INTRAVENOUS

## 2022-04-14 MED ORDER — FENTANYL CITRATE (PF) 100 MCG/2ML IJ SOLN
INTRAMUSCULAR | Status: AC
  Filled 2022-04-14: qty 2

## 2022-04-14 MED ORDER — CIPROFLOXACIN IN D5W 400 MG/200ML IV SOLN
INTRAVENOUS | Status: DC | PRN
  Administered 2022-04-14: 400 mg via INTRAVENOUS

## 2022-04-14 MED ORDER — DICLOFENAC SUPPOSITORY 100 MG
RECTAL | Status: AC
  Filled 2022-04-14: qty 1

## 2022-04-14 MED ORDER — MIDAZOLAM HCL 5 MG/5ML IJ SOLN
INTRAMUSCULAR | Status: DC | PRN
  Administered 2022-04-14: 1 mg via INTRAVENOUS

## 2022-04-14 MED ORDER — MIDAZOLAM HCL 2 MG/2ML IJ SOLN
INTRAMUSCULAR | Status: AC
  Filled 2022-04-14: qty 2

## 2022-04-14 MED ORDER — SODIUM CHLORIDE 0.9 % IV SOLN
INTRAVENOUS | Status: DC | PRN
  Administered 2022-04-14: 20 mL

## 2022-04-14 NOTE — Telephone Encounter (Signed)
KUB has been entered  Recall entered for colon vs cologuard

## 2022-04-14 NOTE — Anesthesia Postprocedure Evaluation (Signed)
Anesthesia Post Note  Patient: Clifford Jones  Procedure(s) Performed: ENDOSCOPIC RETROGRADE CHOLANGIOPANCREATOGRAPHY (ERCP) WITH PROPOFOL PANCREATIC STENT PLACEMENT BALLOON SWEEP OF BILIARY DUCT     Patient location during evaluation: PACU Anesthesia Type: General Level of consciousness: awake and alert Pain management: pain level controlled Vital Signs Assessment: post-procedure vital signs reviewed and stable Respiratory status: spontaneous breathing, nonlabored ventilation and respiratory function stable Cardiovascular status: blood pressure returned to baseline and stable Postop Assessment: no apparent nausea or vomiting Anesthetic complications: no   No notable events documented.  Last Vitals:  Vitals:   04/14/22 1019 04/14/22 1042  BP: 117/88   Pulse: 60 61  Resp: 12   Temp:    SpO2: 95% 94%    Last Pain:  Vitals:   04/14/22 1019  TempSrc:   PainSc: 0-No pain                 Lynda Rainwater

## 2022-04-14 NOTE — Telephone Encounter (Signed)
-----   Message from Irving Copas., MD sent at 04/14/2022  9:37 AM EDT ----- Regarding: Follow-up Clifford Jones, This patient will need a 2-week follow-up KUB can be done at [facility] or at [place]. Also go ahead and put a colonoscopy/Cologuard recall for January 2024.  He is going to think about what he wants to have done but at least will have a recall in the system. Thanks. GM

## 2022-04-14 NOTE — Op Note (Signed)
[facility] Patient Name: Clifford Jones Procedure Date: 04/14/2022 MRN: [id number] Attending MD: Justice Britain , MD, [id number] Date of Birth: 08/20/1950 CSN: [id number] Age: 71 Admit Type: Outpatient Procedure:                ERCP Indications:              Prior ampullary/ papillary polypectomy, Prior                            Endoscopic Retrograde Cholangiopancreatography Providers:                Justice Britain, MD, Doristine Johns, RN, Cletis Athens, Technician, Brien Mates, RNFA Referring MD:             Livingston Diones. Luciana Axe Medicines:                General Anesthesia, Cipro 400 mg IV, Diclofenac 100                            mg rectal Complications:            No immediate complications. Estimated Blood Loss:     Estimated blood loss: none. Procedure:                Pre-Anesthesia Assessment:                           - Prior to the procedure, a History and Physical                            was performed, and patient medications and                            allergies were reviewed. The patient's tolerance of                            previous anesthesia was also reviewed. The risks                            and benefits of the procedure and the sedation                            options and risks were discussed with the patient.                            All questions were answered, and informed consent                            was obtained. Prior Anticoagulants: The patient                            last took Coumadin (warfarin) 5 days prior to the  procedure and has taken no anticoagulant or                            antiplatelet agents except for aspirin. ASA Grade                            Assessment: III - A patient with severe systemic                            disease. After reviewing the risks and benefits,                            the patient was deemed in satisfactory  condition to                            undergo the procedure.                           After obtaining informed consent, the scope was                            passed under direct vision. Throughout the                            procedure, the patient's blood pressure, pulse, and                            oxygen saturations were monitored continuously. The                            [employer] single use                            duodenoscope was introduced through the mouth, and                            used to inject contrast into and used to inject                            contrast into the bile duct and ventral pancreatic                            duct. The ERCP was accomplished without difficulty.                            The patient tolerated the procedure. Scope In: Scope Out: Findings:      The scout film was normal.      The esophagus was successfully intubated under direct vision without       detailed examination of the pharynx, larynx, and associated structures,       and upper GI tract. Inspection of the major papilla revealed that an       ampullectomy (papillectomy) had been performed previously. Inspection       revealed that the biliary and  pancreatic orifices were both open.      A short 0.035 inch Soft Jagwire was passed into the biliary tree. The       Hydratome sphincterotome was passed over the guidewire and the bile duct       was then deeply cannulated. Contrast was injected. I personally       interpreted the bile duct images. Ductal flow of contrast was adequate.       Image quality was adequate. Contrast extended to the entire biliary       tree. Opacification of the entire biliary tree was successful. The       maximum diameter of the ducts was 5 mm. To discover objects, the biliary       tree was swept with a retrieval balloon. Nothing was found. An occlusion       cholangiogram was performed that showed no further  significant biliary       pathology and the gallbladder filled.      A short 0.035 inch Soft Jagwire was then passed into the ventral       pancreatic duct. The ventral pancreatic duct was then deeply cannulated       with the Hydratome sphincterotome. Contrast was injected. I personally       interpreted the pancreatic duct images. Ductal flow of contrast was       adequate. Image quality was adequate. Contrast extended to the       pancreatic duct. The entire opacified area was normal with a PD between       1-2 mm in size. One 4 Fr by 7 cm temporary plastic pancreatic stent with       a single external pigtail was placed into the ventral pancreatic duct.       The stent was in good position. This should help decrease the risk of       post ERCP pancreatitis.      The duodenoscope was withdrawn from the patient. Impression:               - Prior endoscopic papillectomy with open biliary                            and pancreatic orifices.                           - No evidence of any recurrence at ampullary site.                           - The biliary tree was swept and nothing was found.                           - The pancreatic tree is normal in appearance.                           - Pancreatic prophylactic stent placed. Moderate Sedation:      Not Applicable - Patient had care per Anesthesia. Recommendation:           - The patient will be observed post-procedure,                            until all discharge criteria are met.                           -  Discharge patient to home.                           - Patient has a contact number available for                            emergencies. The signs and symptoms of potential                            delayed complications were discussed with the                            patient. Return to normal activities tomorrow.                            Written discharge instructions were provided to the                             patient.                           - Watch for pancreatitis, bleeding, perforation,                            and cholangitis.                           - Patient will need a KUB 2-view in 10-14 days to                            ensure pancreatic stent has migrated successfully.                            If still present at that time will need to be                            scheduled for EGD with stent pull.                           - Observe patient's clinical course.                           - Upper endoscopy with side-viewing endoscope in 2                            years for surveillance (2025).                           - Recommend patient consider Cologuard versus                            colonoscopy for colon cancer screening. I will have                            my team reach out to patient in the  coming weeks to                            see what he decides, versus if he decides to forego                            colon cancer screening.                           - The findings and recommendations were discussed                            with the patient. Procedure Code(s):        --- Professional ---                           [phone], Endoscopic retrograde                            cholangiopancreatography (ERCP); diagnostic,                            including collection of specimen(s) by brushing or                            washing, when performed (separate procedure) Diagnosis Code(s):        --- Professional ---                           V79.444, Other specified postprocedural states CPT copyright 2022 [employer]. All rights reserved. The codes documented in this report are preliminary and upon coder review may  be revised to meet current compliance requirements. Justice Britain, MD 04/14/2022 9:36:34 AM Number of Addenda: 0

## 2022-04-14 NOTE — Transfer of Care (Signed)
Immediate Anesthesia Transfer of Care Note  Patient: Clifford Jones  Procedure(s) Performed: ENDOSCOPIC RETROGRADE CHOLANGIOPANCREATOGRAPHY (ERCP) WITH PROPOFOL PANCREATIC STENT PLACEMENT BALLOON SWEEP OF BILIARY DUCT  Patient Location: PACU and Endoscopy Unit  Anesthesia Type:General  Level of Consciousness: drowsy and patient cooperative  Airway & Oxygen Therapy: Patient Spontanous Breathing and Patient connected to face mask oxygen  Post-op Assessment: Report given to RN and Post -op Vital signs reviewed and stable  Post vital signs: Reviewed and stable  Last Vitals:  Vitals Value Taken Time  BP 194/87 04/14/22 0940  Temp 36.4 C 04/14/22 0939  Pulse 71 04/14/22 0943  Resp 11 04/14/22 0943  SpO2 98 % 04/14/22 0943  Vitals shown include unvalidated device data.  Last Pain:  Vitals:   04/14/22 0939  TempSrc: Tympanic  PainSc: Asleep         Complications: No notable events documented.

## 2022-04-14 NOTE — Discharge Instructions (Signed)
YOU HAD AN ENDOSCOPIC PROCEDURE TODAY: Refer to the procedure report and other information in the discharge instructions given to you for any specific questions about what was found during the examination. If this information does not answer your questions, please call [place] office at [phone] to clarify.  ° °YOU SHOULD EXPECT: Some feelings of bloating in the abdomen. Passage of more gas than usual. Walking can help get rid of the air that was put into your GI tract during the procedure and reduce the bloating. If you had a lower endoscopy (such as a colonoscopy or flexible sigmoidoscopy) you may notice spotting of blood in your stool or on the toilet paper. Some abdominal soreness may be present for a day or two, also. ° °DIET: Your first meal following the procedure should be a light meal and then it is ok to progress to your normal diet. A half-sandwich or bowl of soup is an example of a good first meal. Heavy or fried foods are harder to digest and may make you feel nauseous or bloated. Drink plenty of fluids but you should avoid alcoholic beverages for 24 hours. If you had a esophageal dilation, please see attached instructions for diet.   ° °ACTIVITY: Your care partner should take you home directly after the procedure. You should plan to take it easy, moving slowly for the rest of the day. You can resume normal activity the day after the procedure however YOU SHOULD NOT DRIVE, use power tools, machinery or perform tasks that involve climbing or major physical exertion for 24 hours (because of the sedation medicines used during the test).  ° °SYMPTOMS TO REPORT IMMEDIATELY: °A gastroenterologist can be reached at any hour. Please call [phone]  for any of the following symptoms:  °Following lower endoscopy (colonoscopy, flexible sigmoidoscopy) °Excessive amounts of blood in the stool  °Significant tenderness, worsening of abdominal pains  °Swelling of the abdomen that is new, acute  °Fever of 100° or  higher  °Following upper endoscopy (EGD, EUS, ERCP, esophageal dilation) °Vomiting of blood or coffee ground material  °New, significant abdominal pain  °New, significant chest pain or pain under the shoulder blades  °Painful or persistently difficult swallowing  °New shortness of breath  °Black, tarry-looking or red, bloody stools ° °FOLLOW UP:  °If any biopsies were taken you will be contacted by phone or by letter within the next 1-3 weeks. Call [phone]  if you have not heard about the biopsies in 3 weeks.  °Please also call with any specific questions about appointments or follow up tests. ° °

## 2022-04-14 NOTE — Anesthesia Procedure Notes (Signed)
Procedure Name: Intubation Date/Time: 04/14/2022 8:42 AM  Performed by: West Pugh, CRNAPre-anesthesia Checklist: Patient identified, Emergency Drugs available, Suction available, Patient being monitored and Timeout performed Patient Re-evaluated:Patient Re-evaluated prior to induction Oxygen Delivery Method: Circle system utilized Preoxygenation: Pre-oxygenation with 100% oxygen Induction Type: IV induction Ventilation: Mask ventilation without difficulty Laryngoscope Size: Mac and 4 Grade View: Grade I Tube type: Oral Tube size: 7.5 mm Number of attempts: 1 Airway Equipment and Method: Stylet Placement Confirmation: ETT inserted through vocal cords under direct vision, positive ETCO2, CO2 detector and breath sounds checked- equal and bilateral Secured at: 22 cm Tube secured with: Tape Dental Injury: Teeth and Oropharynx as per pre-operative assessment

## 2022-04-14 NOTE — Anesthesia Preprocedure Evaluation (Signed)
Anesthesia Evaluation  Patient identified by MRN, date of birth, ID band Patient awake    Reviewed: Allergy & Precautions, NPO status , Patient's Chart, lab work & pertinent test results, reviewed documented beta blocker date and time   Airway Mallampati: II  TM Distance: >3 FB Neck ROM: Full    Dental  (+) Dental Advisory Given, Edentulous Lower, Edentulous Upper   Pulmonary COPD,  COPD inhaler, Current Smoker and Patient abstained from smoking., former smoker,    Pulmonary exam normal breath sounds clear to auscultation       Cardiovascular hypertension, Pt. on home beta blockers and Pt. on medications + CAD, + Past MI, + Cardiac Stents, + CABG and + Peripheral Vascular Disease (s/p AAA repair)  Normal cardiovascular exam+ pacemaker  Rhythm:Regular Rate:Normal     Neuro/Psych PSYCHIATRIC DISORDERS Anxiety negative neurological ROS     GI/Hepatic Neg liver ROS, GERD  Medicated,Ampullary Adenoma, HX of ERCP   Endo/Other  diabetes, Type 2, Oral Hypoglycemic Agents  Renal/GU negative Renal ROS     Musculoskeletal  (+) Arthritis ,   Abdominal (+) + obese,   Peds  Hematology  (+) Blood dyscrasia (Warfarin), ,   Anesthesia Other Findings   Reproductive/Obstetrics                             Anesthesia Physical  Anesthesia Plan  ASA: 3  Anesthesia Plan: General   Post-op Pain Management:    Induction: Intravenous  PONV Risk Score and Plan: 2 and Ondansetron, Midazolam and Treatment may vary due to age or medical condition  Airway Management Planned: Oral ETT  Additional Equipment:   Intra-op Plan:   Post-operative Plan: Extubation in OR  Informed Consent: I have reviewed the patients History and Physical, chart, labs and discussed the procedure including the risks, benefits and alternatives for the proposed anesthesia with the patient or authorized representative who has indicated  his/her understanding and acceptance.     Dental advisory given  Plan Discussed with: CRNA  Anesthesia Plan Comments:         Anesthesia Quick Evaluation

## 2022-04-14 NOTE — H&P (Signed)
GASTROENTEROLOGY PROCEDURE H&P NOTE   Primary Care Physician: Lavella Lemons, PA  HPI: Clifford Jones is a 71 y.o. male who presents for ERCP to follow-up previous ampullectomy with slight recurrence in 2022.  Past Medical History:  Diagnosis Date   Ampullary adenoma    Arthritis    Reported by patient   Cancer [facility])    Reported by patient   Claustrophobia    Closed fracture of right distal tibia    COPD (chronic obstructive pulmonary disease) ([place])    Reported by patient   Coronary artery disease    a. 1999 Inf MI;  b. 2003 CABGx6;  c. 2008 NSTEMI, occluded SVG to marginal and diagonal system. 2 DES placed to marginal system;  c. NSTEMI - 02/2012 Xience Xpedition DES to oS-OM and POBA to mid lesion;  c. 10/2013 NSTEMI/PCI: VG->OM 80 ISR, native OM 80/90 small, LIMA->LAD ok w/ 90 dLAD, D1 80, RIMA->RPL ok w/ 95 in RPL (2.5x14 Resolute DES), nl EF.   Dyspnea    Reported by patient   GERD (gastroesophageal reflux disease)    Hyperlipidemia    Hypertension    Myocardial infarction ([place])    X 6   Pneumonia    S/P AAA repair    Tobacco abuse    Type 2 diabetes mellitus without complication, without long-term current use of insulin ([place])    Wears dentures    Wears glasses    Past Surgical History:  Procedure Laterality Date   ABDOMINAL AORTIC ANEURYSM REPAIR  2006   APPENDECTOMY     BIOPSY  03/08/2021   Procedure: BIOPSY;  Surgeon: Irving Copas., MD;  Location: [facility];  Service: Gastroenterology;;   CATARACT EXTRACTION W/PHACO Right 11/05/2021   Procedure: CATARACT EXTRACTION PHACO AND INTRAOCULAR LENS PLACEMENT ([place]);  Surgeon: Baruch Goldmann, MD;  Location: [facility];  Service: Ophthalmology;  Laterality: Right;  CDE: 11.33   CORONARY ANGIOPLASTY WITH STENT PLACEMENT     CORONARY ARTERY BYPASS GRAFT  2003   ENDOSCOPIC RETROGRADE CHOLANGIOPANCREATOGRAPHY (ERCP) WITH PROPOFOL N/A 03/25/2019   Procedure: ENDOSCOPIC RETROGRADE CHOLANGIOPANCREATOGRAPHY (ERCP) WITH  PROPOFOL;  Surgeon: Irving Copas., MD;  Location: [place];  Service: Gastroenterology;  Laterality: N/A;   ENDOSCOPIC RETROGRADE CHOLANGIOPANCREATOGRAPHY (ERCP) WITH PROPOFOL N/A 03/08/2021   Procedure: ENDOSCOPIC RETROGRADE CHOLANGIOPANCREATOGRAPHY (ERCP) WITH PROPOFOL;  Surgeon: Rush Landmark Telford Nab., MD;  Location: [facility];  Service: Gastroenterology;  Laterality: N/A;   ESOPHAGEAL DILATION  07/06/2018   Procedure: ESOPHAGEAL DILATION;  Surgeon: Rogene Houston, MD;  [facility];  Service: Endoscopy;;   ESOPHAGOGASTRODUODENOSCOPY N/A 07/06/2018   Procedure: ESOPHAGOGASTRODUODENOSCOPY (EGD);  Surgeon: Rogene Houston, MD;  [facility];  Service: Endoscopy;  Laterality: N/A;   ESOPHAGOGASTRODUODENOSCOPY N/A 08/16/2018   Procedure: ESOPHAGOGASTRODUODENOSCOPY (EGD);  Surgeon: Milus Banister, MD;  Location: [facility];  Service: Endoscopy;  Laterality: N/A;   ESOPHAGOGASTRODUODENOSCOPY (EGD) WITH PROPOFOL N/A 03/25/2019   Procedure: ESOPHAGOGASTRODUODENOSCOPY (EGD) WITH PROPOFOL;  Surgeon: Rush Landmark Telford Nab., MD;  Location: [place];  Service: Gastroenterology;  Laterality: N/A;   EUS N/A 08/16/2018   Procedure: UPPER ENDOSCOPIC ULTRASOUND (EUS) RADIAL;  Surgeon: Milus Banister, MD;  Location: [facility];  Service: Endoscopy;  Laterality: N/A;   EUS N/A 03/25/2019   Procedure: UPPER ENDOSCOPIC ULTRASOUND (EUS) RADIAL;  Surgeon: Irving Copas., MD;  Location: [place];  Service: Gastroenterology;  Laterality: N/A;   FOREIGN BODY REMOVAL  07/06/2018   Procedure: FOREIGN BODY REMOVAL;  Surgeon: Rogene Houston,  MD;  [facility];  Service: Endoscopy;;   HERNIA REPAIR     umbicical   LEFT HEART CATHETERIZATION WITH CORONARY ANGIOGRAM N/A 03/05/2012   Procedure: LEFT HEART CATHETERIZATION WITH CORONARY ANGIOGRAM;  Surgeon: Hillary Bow, MD;  Location: [facility] CATH LAB;  Service: Cardiovascular;  Laterality: N/A;   LEFT  HEART CATHETERIZATION WITH CORONARY ANGIOGRAM N/A 10/23/2013   Procedure: LEFT HEART CATHETERIZATION WITH CORONARY ANGIOGRAM;  Surgeon: Blane Ohara, MD;  Location: [facility] CATH LAB;  Service: Cardiovascular;  Laterality: N/A;   LEFT HEART CATHETERIZATION WITH CORONARY/GRAFT ANGIOGRAM  10/22/2013   Procedure: LEFT HEART CATHETERIZATION WITH Beatrix Fetters;  Surgeon: Leonie Man, MD;  Location: [facility] CATH LAB;  Service: Cardiovascular;;   PANCREATIC STENT PLACEMENT  03/25/2019   Procedure: PANCREATIC STENT PLACEMENT;  Surgeon: Rush Landmark Telford Nab., MD;  Location: [place];  Service: Gastroenterology;;   PANCREATIC STENT PLACEMENT  03/08/2021   Procedure: PANCREATIC STENT PLACEMENT;  Surgeon: Irving Copas., MD;  Location: [facility];  Service: Gastroenterology;;   PERCUTANEOUS CORONARY STENT INTERVENTION (PCI-S) N/A 03/07/2012   Procedure: PERCUTANEOUS CORONARY STENT INTERVENTION (PCI-S);  Surgeon: Sherren Mocha, MD;  Location: [facility] C[facility] LAB;  Service: Cardiovascular;  Laterality: N/A;   PERCUTANEOUS CORONARY STENT INTERVENTION (PCI-S) Right 10/23/2013   Procedure: PERCUTANEOUS CORONARY STENT INTERVENTION (PCI-S);  Surgeon: Blane Ohara, MD;  Location: [facility] CATH LAB;  Service: Cardiovascular;  Laterality: Right;   POLYPECTOMY  07/06/2018   Procedure: POLYPECTOMY;  Surgeon: Rogene Houston, MD;  [facility];  Service: Endoscopy;;  polyp at ampulla   POLYPECTOMY  03/25/2019   Procedure: AMPULLECTOMY;  Surgeon: Mansouraty, Telford Nab., MD;  Location: [place];  Service: Gastroenterology;;   REMOVAL OF STONES  03/08/2021   Procedure: REMOVAL OF STONES;  Surgeon: Irving Copas., MD;  Location: [facility];  Service: Gastroenterology;;   Right hand surgery     RIGHT HEART CATH AND CORONARY/GRAFT ANGIOGRAPHY N/A 12/16/2019   Procedure: RIGHT HEART CATH AND CORONARY/GRAFT ANGIOGRAPHY;  Surgeon: Nelva Bush, MD;  Location: [place] [facility];  Service:  Cardiovascular;  Laterality: N/A;   SPHINCTEROTOMY  03/25/2019   Procedure: SPHINCTEROTOMY;  Surgeon: Mansouraty, Telford Nab., MD;  Location: [place];  Service: Gastroenterology;;   TIBIA IM NAIL INSERTION Right 04/07/2016   Procedure: FIXATION OF RIGHT TIBIA FRACTURE;  Surgeon: Garald Balding, MD;  Location: [place];  Service: Orthopedics;  Laterality: Right;   No current facility-administered medications for this encounter.   No current facility-administered medications for this encounter. No Known Allergies Family History  Problem Relation Age of Onset   Coronary artery disease Other    Colon cancer Neg Hx    Esophageal cancer Neg Hx    Inflammatory bowel disease Neg Hx    Liver disease Neg Hx    Pancreatic cancer Neg Hx    Rectal cancer Neg Hx    Stomach cancer Neg Hx    Social History   Socioeconomic History   Marital status: Legally Separated    Spouse name: Not on file   Number of children: Not on file   Years of education: Not on file   Highest education level: Not on file  Occupational History   Not on file  Tobacco Use   Smoking status: Some Days    Packs/day: 1.00    Years: 54.00    Total pack years: 54.00    Types: Cigarettes    Start date: 10/04/1964    Last attempt to quit:  10/2020    Years since quitting: 1.4   Smokeless tobacco: Never   Tobacco comments:    since November 2020-has smoked a total of 6 packs    08/11/2021 Per patient Smoked 3 Packs of cigarettes in the past 8 months  Vaping Use   Vaping Use: Never used  Substance and Sexual Activity   Alcohol use: Yes    Comment: Maybe 2 beers a month and 2 shotsa month   Drug use: No   Sexual activity: Yes  Other Topics Concern   Not on file  Social History Narrative   Disabled.  Former truck Software engineer. Divorced and remarried x 2         Social Determinants of Radio broadcast assistant Strain: Not on file  Food Insecurity: Not on file  Transportation Needs: Not on  file  Physical Activity: Not on file  Stress: Not on file  Social Connections: Not on file  Intimate Partner Violence: Not on file    Physical Exam: Today's Vitals   04/07/22 1149  Weight: 104.3 kg   Body mass index is 31.19 kg/m. GEN: NAD EYE: Sclerae anicteric ENT: MMM CV: Non-tachycardic GI: Soft, NT/ND NEURO:  Alert & Oriented x 3  Lab Results: No results for input(s): "WBC", "HGB", "HCT", "PLT" in the last 72 hours. BMET No results for input(s): "NA", "K", "CL", "CO2", "GLUCOSE", "BUN", "CREATININE", "CALCIUM" in the last 72 hours. LFT No results for input(s): "PROT", "ALBUMIN", "AST", "ALT", "ALKPHOS", "BILITOT", "BILIDIR", "IBILI" in the last 72 hours. PT/INR No results for input(s): "LABPROT", "INR" in the last 72 hours.   Impression / Plan: This is a 71 y.o.male who presents for ERCP to follow-up previous ampullectomy with slight recurrence in 2022.  The risks of an ERCP were discussed at length, including but not limited to the risk of perforation, bleeding, abdominal pain, post-ERCP pancreatitis (while usually mild can be severe and even life threatening).  The risks and benefits of endoscopic evaluation/treatment were discussed with the patient and/or family; these include but are not limited to the risk of perforation, infection, bleeding, missed lesions, lack of diagnosis, severe illness requiring hospitalization, as well as anesthesia and sedation related illnesses.  The patient's history has been reviewed, patient examined, no change in status, and deemed stable for procedure.  The patient and/or family is agreeable to proceed.    Justice Britain, MD [facility] Office # [phone]

## 2022-04-17 ENCOUNTER — Encounter (HOSPITAL_COMMUNITY): Payer: Self-pay | Admitting: Gastroenterology

## 2022-04-21 DIAGNOSIS — Z20828 Contact with and (suspected) exposure to other viral communicable diseases: Secondary | ICD-10-CM | POA: Diagnosis not present

## 2022-04-21 DIAGNOSIS — F17210 Nicotine dependence, cigarettes, uncomplicated: Secondary | ICD-10-CM | POA: Diagnosis not present

## 2022-04-21 DIAGNOSIS — R030 Elevated blood-pressure reading, without diagnosis of hypertension: Secondary | ICD-10-CM | POA: Diagnosis not present

## 2022-04-21 DIAGNOSIS — R059 Cough, unspecified: Secondary | ICD-10-CM | POA: Diagnosis not present

## 2022-04-21 DIAGNOSIS — J441 Chronic obstructive pulmonary disease with (acute) exacerbation: Secondary | ICD-10-CM | POA: Diagnosis not present

## 2022-04-21 DIAGNOSIS — Z6833 Body mass index (BMI) 33.0-33.9, adult: Secondary | ICD-10-CM | POA: Diagnosis not present

## 2022-04-27 ENCOUNTER — Ambulatory Visit: Payer: Medicare HMO | Attending: Family Medicine | Admitting: *Deleted

## 2022-04-27 DIAGNOSIS — Z5181 Encounter for therapeutic drug level monitoring: Secondary | ICD-10-CM | POA: Diagnosis not present

## 2022-04-27 DIAGNOSIS — I513 Intracardiac thrombosis, not elsewhere classified: Secondary | ICD-10-CM

## 2022-04-27 LAB — POCT INR: INR: 2.5 (ref 2.0–3.0)

## 2022-04-27 NOTE — Patient Instructions (Signed)
Continue warfarin 1 tablet daily except 1/2 tablet on Mondays and Thursdays Recheck INR in 4 wks

## 2022-05-25 ENCOUNTER — Ambulatory Visit: Payer: Medicare HMO | Attending: Family Medicine | Admitting: *Deleted

## 2022-05-25 DIAGNOSIS — I513 Intracardiac thrombosis, not elsewhere classified: Secondary | ICD-10-CM | POA: Diagnosis not present

## 2022-05-25 DIAGNOSIS — Z5181 Encounter for therapeutic drug level monitoring: Secondary | ICD-10-CM | POA: Diagnosis not present

## 2022-05-25 LAB — POCT INR: INR: 2.9 (ref 2.0–3.0)

## 2022-05-25 NOTE — Patient Instructions (Signed)
Continue warfarin 1 tablet daily except 1/2 tablet on Mondays and Thursdays Recheck INR in 6 wks 

## 2022-05-26 DIAGNOSIS — H353114 Nonexudative age-related macular degeneration, right eye, advanced atrophic with subfoveal involvement: Secondary | ICD-10-CM | POA: Diagnosis not present

## 2022-05-30 DIAGNOSIS — Z6832 Body mass index (BMI) 32.0-32.9, adult: Secondary | ICD-10-CM | POA: Diagnosis not present

## 2022-05-30 DIAGNOSIS — J441 Chronic obstructive pulmonary disease with (acute) exacerbation: Secondary | ICD-10-CM | POA: Diagnosis not present

## 2022-05-30 DIAGNOSIS — J0101 Acute recurrent maxillary sinusitis: Secondary | ICD-10-CM | POA: Diagnosis not present

## 2022-05-30 DIAGNOSIS — R059 Cough, unspecified: Secondary | ICD-10-CM | POA: Diagnosis not present

## 2022-05-30 DIAGNOSIS — R030 Elevated blood-pressure reading, without diagnosis of hypertension: Secondary | ICD-10-CM | POA: Diagnosis not present

## 2022-05-30 DIAGNOSIS — F17210 Nicotine dependence, cigarettes, uncomplicated: Secondary | ICD-10-CM | POA: Diagnosis not present

## 2022-06-03 NOTE — Telephone Encounter (Signed)
The pt states he will come in next week for Xray

## 2022-06-08 ENCOUNTER — Ambulatory Visit (INDEPENDENT_AMBULATORY_CARE_PROVIDER_SITE_OTHER)
Admission: RE | Admit: 2022-06-08 | Discharge: 2022-06-08 | Disposition: A | Discharge status: Home / Self Care | Payer: Medicare HMO | Source: Ambulatory Visit | Attending: Gastroenterology | Admitting: Gastroenterology

## 2022-06-08 DIAGNOSIS — K5900 Constipation, unspecified: Secondary | ICD-10-CM | POA: Diagnosis not present

## 2022-06-08 DIAGNOSIS — Z9689 Presence of other specified functional implants: Secondary | ICD-10-CM

## 2022-06-29 ENCOUNTER — Other Ambulatory Visit: Payer: Self-pay | Admitting: Cardiology

## 2022-07-06 ENCOUNTER — Ambulatory Visit: Payer: Medicare HMO | Attending: Cardiology | Admitting: *Deleted

## 2022-07-06 DIAGNOSIS — I513 Intracardiac thrombosis, not elsewhere classified: Secondary | ICD-10-CM | POA: Diagnosis not present

## 2022-07-06 DIAGNOSIS — Z5181 Encounter for therapeutic drug level monitoring: Secondary | ICD-10-CM | POA: Diagnosis not present

## 2022-07-06 LAB — POCT INR: INR: 3 (ref 2.0–3.0)

## 2022-07-06 MED ORDER — WARFARIN SODIUM 2 MG PO TABS: ORAL_TABLET | ORAL | 5 refills | Status: DC

## 2022-07-06 NOTE — Patient Instructions (Signed)
Continue warfarin 1 tablet daily except 1/2 tablet on Mondays and Thursdays Recheck INR in 6 wks 

## 2022-07-21 DIAGNOSIS — H43812 Vitreous degeneration, left eye: Secondary | ICD-10-CM | POA: Diagnosis not present

## 2022-07-21 DIAGNOSIS — H353124 Nonexudative age-related macular degeneration, left eye, advanced atrophic with subfoveal involvement: Secondary | ICD-10-CM | POA: Diagnosis not present

## 2022-07-21 DIAGNOSIS — H353114 Nonexudative age-related macular degeneration, right eye, advanced atrophic with subfoveal involvement: Secondary | ICD-10-CM | POA: Diagnosis not present

## 2022-08-16 DIAGNOSIS — J441 Chronic obstructive pulmonary disease with (acute) exacerbation: Secondary | ICD-10-CM | POA: Diagnosis not present

## 2022-08-16 DIAGNOSIS — Z6833 Body mass index (BMI) 33.0-33.9, adult: Secondary | ICD-10-CM | POA: Diagnosis not present

## 2022-08-16 DIAGNOSIS — F17210 Nicotine dependence, cigarettes, uncomplicated: Secondary | ICD-10-CM | POA: Diagnosis not present

## 2022-08-16 DIAGNOSIS — R030 Elevated blood-pressure reading, without diagnosis of hypertension: Secondary | ICD-10-CM | POA: Diagnosis not present

## 2022-08-17 ENCOUNTER — Ambulatory Visit: Payer: Medicare HMO | Attending: Cardiology | Admitting: *Deleted

## 2022-08-17 DIAGNOSIS — I513 Intracardiac thrombosis, not elsewhere classified: Secondary | ICD-10-CM | POA: Diagnosis not present

## 2022-08-17 DIAGNOSIS — Z5181 Encounter for therapeutic drug level monitoring: Secondary | ICD-10-CM

## 2022-08-17 LAB — POCT INR: INR: 3 (ref 2.0–3.0)

## 2022-08-17 NOTE — Patient Instructions (Signed)
Continue warfarin 1 tablet daily except 1/2 tablet on Mondays and Thursdays Recheck INR in 6 wks

## 2022-08-29 ENCOUNTER — Encounter: Payer: Self-pay | Admitting: *Deleted

## 2022-08-29 ENCOUNTER — Encounter: Payer: Self-pay | Admitting: Cardiology

## 2022-08-29 ENCOUNTER — Ambulatory Visit: Payer: Medicare HMO | Attending: Cardiology | Admitting: Cardiology

## 2022-08-29 VITALS — BP 138/78 | HR 66 | Ht 72.0 in | Wt 218.6 lb

## 2022-08-29 DIAGNOSIS — I10 Essential (primary) hypertension: Secondary | ICD-10-CM | POA: Diagnosis not present

## 2022-08-29 DIAGNOSIS — E782 Mixed hyperlipidemia: Secondary | ICD-10-CM

## 2022-08-29 DIAGNOSIS — I2583 Coronary atherosclerosis due to lipid rich plaque: Secondary | ICD-10-CM

## 2022-08-29 DIAGNOSIS — I513 Intracardiac thrombosis, not elsewhere classified: Secondary | ICD-10-CM

## 2022-08-29 DIAGNOSIS — I2510 Atherosclerotic heart disease of native coronary artery without angina pectoris: Secondary | ICD-10-CM

## 2022-08-29 DIAGNOSIS — R600 Localized edema: Secondary | ICD-10-CM | POA: Diagnosis not present

## 2022-08-29 NOTE — Progress Notes (Signed)
Clinical Summary Mr. Montford is a 72 y.o.male seen today for follow up of the following medical problems.     1. CAD - history of CABG in 2003 -  h/o non-STEMI and PCI with DES to the native RPLA via the [place] graft. He has residual VG->OM, native OM, distal LAD, and diagonal disease      -NSTEMI 11/2019 - Cardiac catheterization performed on 12/16/2019 showed 80% distal left main with heavy calcification involving the ostium of the left circumflex up to 90%, chronic total occlusion of mid and distal LAD, 80% D1 stenosis, chronic total occlusion of ramus intermedius, distal left circumflex and proximal RCA, widely patent LIMA to LAD and patent RIMA to distal RCA, chronically occluded sequential SVG to D1 and D2, chronically occluded SVG to OM.  SVG to ramus was unable to be engaged however likely occluded RCA was not seen on the ascending aortogram.  Postprocedure, medical therapy was recommended If patient has any refractory angiogram, only interventional target would be distal left main although this will be very challenging given the heavy calcification and severe stenosis in the ostial left circumflex artery.  PCI of D1 could also be considered at the time   07/2020 echo LVEF 50-55%, sluggish apical flow without thrombus   -beta blocker dosing limited due to bradycardia   - no chest pains, no SOB/DOE - compliant with meds     2. LE edema - no recent edema - has prn lasix, takes about once every 2 weeks.    3. HTN -- he reports a component of white coat HTN - home bp's 130s/70s    4. Hyperlipidemia - compliant with meds - 08/2021 TC 149 TG 357 HDL 47 LDL 49     5. Ampullary adenoma - noted by prior EGD, s/p ERCP and ampullectomy.        6.LV thrombus Echocardiogram performed on 12/15/2019 showed a small apical LV thrombus measuring 6 x 6 mm, EF 50 to 55%, grade 2 DD, RVSP 28.6 mmHg.     07/2020 ehco: LVEF 50-55%, no thrombus though sluggish apical flow - no bleeding on  coumadin   7. AAA repair - around 2006 Past Medical History:  Diagnosis Date   Ampullary adenoma    Arthritis    Reported by patient   Cancer [facility])    Reported by patient   Claustrophobia    Closed fracture of right distal tibia    COPD (chronic obstructive pulmonary disease) ([place])    Reported by patient   Coronary artery disease    a. 1999 Inf MI;  b. 2003 CABGx6;  c. 2008 NSTEMI, occluded SVG to marginal and diagonal system. 2 DES placed to marginal system;  c. NSTEMI - 02/2012 [employer]ience Xpedition DES to oS-OM and POBA to mid lesion;  c. 10/2013 NSTEMI/PCI: VG->OM 80 ISR, native OM 80/90 small, LIMA->LAD ok w/ 90 dLAD, D1 80, RIMA->RPL ok w/ 95 in RPL (2.5x14 Resolute DES), nl EF.   Dyspnea    Reported by patient   GERD (gastroesophageal reflux disease)    Hyperlipidemia    Hypertension    Myocardial infarction ([place])    X 6   Pneumonia    S/P AAA repair    Tobacco abuse    Type 2 diabetes mellitus without complication, without long-term current use of insulin ([place])    Wears dentures    Wears glasses      No Known Allergies   Current Outpatient Medications  Medication  Sig Dispense Refill   acetaminophen (TYLENOL) 500 MG tablet Take 1,000 mg by mouth every 6 (six) hours as needed for mild pain, moderate pain or headache.      albuterol (PROVENTIL) (2.5 MG/3ML) 0.083% nebulizer solution Take 2.5 mg by nebulization every 4 (four) hours as needed for wheezing or shortness of breath.     amLODipine (NORVASC) 5 MG tablet TAKE 1 TABLET BY MOUTH DAILY 30 tablet 6   ascorbic acid (VITAMIN C) 500 MG tablet Take 1 tablet (500 mg total) by mouth daily. 30 tablet 1   aspirin 81 MG EC tablet Take 1 tablet (81 mg total) by mouth daily. 30 tablet 11   carvedilol (COREG) 3.125 MG tablet TAKE 1 TABLET BY MOUTH TWICE DAILY 60 tablet 6   furosemide (LASIX) 20 MG tablet Take 20 mg by mouth daily as needed for edema.     ipratropium-albuterol (DUONEB) 0.5-2.5 (3) MG/3ML SOLN Take 3 mLs by  nebulization every 6 (six) hours as needed (Wheezing/SOB).     lisinopril (ZESTRIL) 2.5 MG tablet Take 1 tablet (2.5 mg total) by mouth daily. 60 tablet 1   magnesium hydroxide (MILK OF MAGNESIA) 400 MG/5ML suspension Take 15 mLs by mouth daily as needed for mild constipation. 355 mL 0   nitroGLYCERIN (NITROSTAT) 0.4 MG SL tablet Place 1 tablet (0.4 mg total) under the tongue every 5 (five) minutes x 3 doses as needed for chest pain. 25 tablet 4   pantoprazole (PROTONIX) 40 MG tablet Take 40 mg by mouth 2 (two) times daily.     RANEXA 1000 MG SR tablet TAKE ONE TABLET BY MOUTH TWICE DAILY 30 tablet 0   rosuvastatin (CRESTOR) 40 MG tablet Take 1 tablet (40 mg total) by mouth at bedtime. 90 tablet 1   vitamin B-12 (CYANOCOBALAMIN) 1000 MCG tablet Take 1,000 mcg by mouth daily.     warfarin (COUMADIN) 2 MG tablet TAKE 1 TABLET BY MOUTH DAILY EXCEPT 1/2 TABLET ON MONDAYS AND THURSDAYS OR AS DIRECTED 30 tablet 5   zinc gluconate 50 MG tablet Take 50 mg by mouth daily.     cycloSPORINE (RESTASIS) 0.05 % ophthalmic emulsion Place 1 drop into both eyes 2 (two) times daily. (Patient not taking: Reported on 08/29/2022)     isosorbide mononitrate (IMDUR) 30 MG 24 hr tablet Take 1 tablet (30 mg total) by mouth daily. (Patient not taking: Reported on 08/29/2022) 90 tablet 1   No current facility-administered medications for this visit.     Past Surgical History:  Procedure Laterality Date   ABDOMINAL AORTIC ANEURYSM REPAIR  2006   APPENDECTOMY     BIOPSY  03/08/2021   Procedure: BIOPSY;  Surgeon: Irving Copas., MD;  Location: [facility];  Service: Gastroenterology;;   CATARACT EXTRACTION W/PHACO Right 11/05/2021   Procedure: CATARACT EXTRACTION PHACO AND INTRAOCULAR LENS PLACEMENT ([place]);  Surgeon: Baruch Goldmann, MD;  Location: [facility];  Service: Ophthalmology;  Laterality: Right;  CDE: 11.33   CORONARY ANGIOPLASTY WITH STENT PLACEMENT     CORONARY ARTERY BYPASS GRAFT  2003   ENDOSCOPIC  RETROGRADE CHOLANGIOPANCREATOGRAPHY (ERCP) WITH PROPOFOL N/A 03/25/2019   Procedure: ENDOSCOPIC RETROGRADE CHOLANGIOPANCREATOGRAPHY (ERCP) WITH PROPOFOL;  Surgeon: Irving Copas., MD;  Location: [place];  Service: Gastroenterology;  Laterality: N/A;   ENDOSCOPIC RETROGRADE CHOLANGIOPANCREATOGRAPHY (ERCP) WITH PROPOFOL N/A 03/08/2021   Procedure: ENDOSCOPIC RETROGRADE CHOLANGIOPANCREATOGRAPHY (ERCP) WITH PROPOFOL;  Surgeon: Rush Landmark Telford Nab., MD;  Location: [facility];  Service: Gastroenterology;  Laterality: N/A;   ENDOSCOPIC RETROGRADE CHOLANGIOPANCREATOGRAPHY (ERCP)  WITH PROPOFOL N/A 04/14/2022   Procedure: ENDOSCOPIC RETROGRADE CHOLANGIOPANCREATOGRAPHY (ERCP) WITH PROPOFOL;  Surgeon: Rush Landmark Telford Nab., MD;  Location: [facility];  Service: Gastroenterology;  Laterality: N/A;   ESOPHAGEAL DILATION  07/06/2018   Procedure: ESOPHAGEAL DILATION;  Surgeon: Rogene Houston, MD;  [facility];  Service: Endoscopy;;   ESOPHAGOGASTRODUODENOSCOPY N/A 07/06/2018   Procedure: ESOPHAGOGASTRODUODENOSCOPY (EGD);  Surgeon: Rogene Houston, MD;  [facility];  Service: Endoscopy;  Laterality: N/A;   ESOPHAGOGASTRODUODENOSCOPY N/A 08/16/2018   Procedure: ESOPHAGOGASTRODUODENOSCOPY (EGD);  Surgeon: Milus Banister, MD;  Location: [facility];  Service: Endoscopy;  Laterality: N/A;   ESOPHAGOGASTRODUODENOSCOPY (EGD) WITH PROPOFOL N/A 03/25/2019   Procedure: ESOPHAGOGASTRODUODENOSCOPY (EGD) WITH PROPOFOL;  Surgeon: Rush Landmark Telford Nab., MD;  Location: [place];  Service: Gastroenterology;  Laterality: N/A;   EUS N/A 08/16/2018   Procedure: UPPER ENDOSCOPIC ULTRASOUND (EUS) RADIAL;  Surgeon: Milus Banister, MD;  Location: [facility];  Service: Endoscopy;  Laterality: N/A;   EUS N/A 03/25/2019   Procedure: UPPER ENDOSCOPIC ULTRASOUND (EUS) RADIAL;  Surgeon: Irving Copas., MD;  Location: [place];  Service: Gastroenterology;  Laterality: N/A;    FOREIGN BODY REMOVAL  07/06/2018   Procedure: FOREIGN BODY REMOVAL;  Surgeon: Rogene Houston, MD;  [facility];  Service: Endoscopy;;   HERNIA REPAIR     umbicical   LEFT HEART CATHETERIZATION WITH CORONARY ANGIOGRAM N/A 03/05/2012   Procedure: LEFT HEART CATHETERIZATION WITH CORONARY ANGIOGRAM;  Surgeon: Hillary Bow, MD;  Location: [facility] CATH LAB;  Service: Cardiovascular;  Laterality: N/A;   LEFT HEART CATHETERIZATION WITH CORONARY ANGIOGRAM N/A 10/23/2013   Procedure: LEFT HEART CATHETERIZATION WITH CORONARY ANGIOGRAM;  Surgeon: Blane Ohara, MD;  Location: [facility] CATH LAB;  Service: Cardiovascular;  Laterality: N/A;   LEFT HEART CATHETERIZATION WITH CORONARY/GRAFT ANGIOGRAM  10/22/2013   Procedure: LEFT HEART CATHETERIZATION WITH Beatrix Fetters;  Surgeon: Leonie Man, MD;  Location: [facility] CATH LAB;  Service: Cardiovascular;;   PANCREATIC STENT PLACEMENT  03/25/2019   Procedure: PANCREATIC STENT PLACEMENT;  Surgeon: Rush Landmark Telford Nab., MD;  Location: [place];  Service: Gastroenterology;;   PANCREATIC STENT PLACEMENT  03/08/2021   Procedure: PANCREATIC STENT PLACEMENT;  Surgeon: Irving Copas., MD;  Location: [facility];  Service: Gastroenterology;;   PANCREATIC STENT PLACEMENT  04/14/2022   Procedure: PANCREATIC STENT PLACEMENT;  Surgeon: Irving Copas., MD;  Location: [facility];  Service: Gastroenterology;;   PERCUTANEOUS CORONARY STENT INTERVENTION (PCI-S) N/A 03/07/2012   Procedure: PERCUTANEOUS CORONARY STENT INTERVENTION (PCI-S);  Surgeon: Sherren Mocha, MD;  Location: [facility] CATH LAB;  Service: Cardiovascular;  Laterality: N/A;   PERCUTANEOUS CORONARY STENT INTERVENTION (PCI-S) Right 10/23/2013   Procedure: PERCUTANEOUS CORONARY STENT INTERVENTION (PCI-S);  Surgeon: Blane Ohara, MD;  Location: [facility] CATH LAB;  Service: Cardiovascular;  Laterality: Right;   POLYPECTOMY  07/06/2018   Procedure: POLYPECTOMY;  Surgeon: Rogene Houston, MD;   [facility];  Service: Endoscopy;;  polyp at ampulla   POLYPECTOMY  03/25/2019   Procedure: AMPULLECTOMY;  Surgeon: Mansouraty, Telford Nab., MD;  Location: [place];  Service: Gastroenterology;;   REMOVAL OF STONES  03/08/2021   Procedure: REMOVAL OF STONES;  Surgeon: Irving Copas., MD;  Location: [facility];  Service: Gastroenterology;;   Right hand surgery     RIGHT HEART CATH AND CORONARY/GRAFT ANGIOGRAPHY N/A 12/16/2019   Procedure: RIGHT HEART CATH AND CORONARY/GRAFT ANGIOGRAPHY;  Surgeon: Nelva Bush, MD;  Location: [place] CV LAB;  Service: Cardiovascular;  Laterality: N/A;   SPHINCTEROTOMY  03/25/2019   Procedure: SPHINCTEROTOMY;  Surgeon: Mansouraty, Telford Nab., MD;  Location: [place];  Service: Gastroenterology;;   TIBIA IM NAIL INSERTION Right 04/07/2016   Procedure: FIXATION OF RIGHT TIBIA FRACTURE;  Surgeon: Garald Balding, MD;  Location: [place];  Service: Orthopedics;  Laterality: Right;     No Known Allergies    Family History  Problem Relation Age of Onset   Coronary artery disease Other    Colon cancer Neg Hx    Esophageal cancer Neg Hx    Inflammatory bowel disease Neg Hx    Liver disease Neg Hx    Pancreatic cancer Neg Hx    Rectal cancer Neg Hx    Stomach cancer Neg Hx      Social History Mr. Tippie reports that he has been smoking cigarettes. He started smoking about 57 years ago. He has a 54.00 pack-year smoking history. He has never used smokeless tobacco. Mr. Coutts reports current alcohol use.   Review of Systems CONSTITUTIONAL: No weight loss, fever, chills, weakness or fatigue.  HEENT: Eyes: No visual loss, blurred vision, double vision or yellow sclerae.No hearing loss, sneezing, congestion, runny nose or sore throat.  SKIN: No rash or itching.  CARDIOVASCULAR: per hpi RESPIRATORY: No shortness of breath, cough or sputum.  GASTROINTESTINAL: No anorexia, nausea, vomiting or diarrhea. No abdominal pain or  blood.  GENITOURINARY: No burning on urination, no polyuria NEUROLOGICAL: No headache, dizziness, syncope, paralysis, ataxia, numbness or tingling in the extremities. No change in bowel or bladder control.  MUSCULOSKELETAL: No muscle, back pain, joint pain or stiffness.  LYMPHATICS: No enlarged nodes. No history of splenectomy.  PSYCHIATRIC: No history of depression or anxiety.  ENDOCRINOLOGIC: No reports of sweating, cold or heat intolerance. No polyuria or polydipsia.  Marland Kitchen   Physical Examination Today's Vitals   08/29/22 1152 08/29/22 1215  BP: (!) 144/86 138/78  Pulse: 66   SpO2: 94%   Weight: 218 lb 9.6 oz (99.2 kg)   Height: 6' (1.829 m)    Body mass index is 29.65 kg/m.  Gen: resting comfortably, no acute distress HEENT: no scleral icterus, pupils equal round and reactive, no palptable cervical adenopathy,  CV: RRR, no m/r/g, no jvd Resp: Clear to auscultation bilaterally GI: abdomen is soft, non-tender, non-distended, normal bowel sounds, no hepatosplenomegaly MSK: extremities are warm, no edema.  Skin: warm, no rash Neuro:  no focal deficits Psych: appropriate affect   Diagnostic Studies 02/2015 echo Study Conclusions   - Left ventricle: The cavity size was normal. There was mild   concentric hypertrophy. Systolic function was normal. The   estimated ejection fraction was in the range of 55% to 60%.   Possible hypokinesis of the basalinferior myocardium. Marked   bradycardia limits evaluation of LV diastolic function. - Aortic valve: There was trivial regurgitation. - Left atrium: The atrium was moderately dilated. - Right ventricle: Systolic function was mildly reduced.     02/2015 nuclear stress There was no ST segment deviation noted during stress. T wave inversion of 1 mm was noted during stress in the I, II, aVF, V5 and V6 leads. T wave inversion persisted. Defect 1: There is a medium defect of severe severity present in the apical anterior, apical inferior  and apex location. Findings consistent with prior myocardial infarction. There is no evidence of ischemia. This is an intermediate risk study. Nuclear stress EF: 39%.    Assessment and Plan   1. CAD - severe history  of CAD/extensive disease, have continued ASA while on coumadin in this setting.  - no symptoms, continue current meds   2. LE edema - controlled, continue prn lasix   3.LV thrombus - 07/2020 echo show resolution, but apical sluggish flow, high risk of recurrence and thus have continued coumadin indefinitely - continue current meds   4. HTN - home numbers at goal, tends to run a little higher in clinic - continue current meds   5. Hyperlipidemia - continue current meds, request pcp labs.  - LDL has been at goal     Arnoldo Lenis, M.D.

## 2022-08-29 NOTE — Patient Instructions (Signed)
Medication Instructions:  Continue all current medications.   Labwork: none  Testing/Procedures: none  Follow-Up: 6 months   Any Other Special Instructions Will Be Listed Below (If Applicable).   If you need a refill on your cardiac medications before your next appointment, please call your pharmacy.  

## 2022-09-22 DIAGNOSIS — H353114 Nonexudative age-related macular degeneration, right eye, advanced atrophic with subfoveal involvement: Secondary | ICD-10-CM | POA: Diagnosis not present

## 2022-09-26 ENCOUNTER — Telehealth: Payer: Self-pay | Admitting: Gastroenterology

## 2022-09-26 DIAGNOSIS — D135 Benign neoplasm of extrahepatic bile ducts: Secondary | ICD-10-CM

## 2022-09-26 DIAGNOSIS — Z9889 Other specified postprocedural states: Secondary | ICD-10-CM

## 2022-09-26 DIAGNOSIS — Z9689 Presence of other specified functional implants: Secondary | ICD-10-CM

## 2022-09-26 NOTE — Telephone Encounter (Signed)
PT is calling to schedule ERCP at hospital. Please advise

## 2022-09-27 NOTE — Telephone Encounter (Signed)
Returned call to patient. Had to leave message.  

## 2022-09-28 ENCOUNTER — Ambulatory Visit: Payer: Medicare HMO | Attending: Cardiology | Admitting: *Deleted

## 2022-09-28 DIAGNOSIS — I513 Intracardiac thrombosis, not elsewhere classified: Secondary | ICD-10-CM

## 2022-09-28 DIAGNOSIS — Z5181 Encounter for therapeutic drug level monitoring: Secondary | ICD-10-CM | POA: Diagnosis not present

## 2022-09-28 LAB — POCT INR: INR: 3 (ref 2.0–3.0)

## 2022-09-28 NOTE — Patient Instructions (Signed)
Continue warfarin 1 tablet daily except 1/2 tablet on Mondays and Thursdays Recheck INR in 6 wks 

## 2022-10-06 ENCOUNTER — Other Ambulatory Visit: Payer: Self-pay

## 2022-10-06 DIAGNOSIS — D135 Benign neoplasm of extrahepatic bile ducts: Secondary | ICD-10-CM

## 2022-10-06 DIAGNOSIS — Z9889 Other specified postprocedural states: Secondary | ICD-10-CM

## 2022-10-06 NOTE — Telephone Encounter (Signed)
Returned call to patient. He has been scheduled for office visit 11/09/22 at 3:50 pm. His ERCP has been scheduled for 11/28/22 @ [facility] 10:30 am , [facility] 9:30 am.  Patient will be given instructions at his visit. I will send cardiac clearance.

## 2022-10-17 DIAGNOSIS — R5383 Other fatigue: Secondary | ICD-10-CM | POA: Diagnosis not present

## 2022-10-17 DIAGNOSIS — S30860A Insect bite (nonvenomous) of lower back and pelvis, initial encounter: Secondary | ICD-10-CM | POA: Diagnosis not present

## 2022-10-17 DIAGNOSIS — Z6830 Body mass index (BMI) 30.0-30.9, adult: Secondary | ICD-10-CM | POA: Diagnosis not present

## 2022-10-17 DIAGNOSIS — R030 Elevated blood-pressure reading, without diagnosis of hypertension: Secondary | ICD-10-CM | POA: Diagnosis not present

## 2022-11-09 ENCOUNTER — Telehealth: Payer: Self-pay

## 2022-11-09 ENCOUNTER — Encounter: Payer: Self-pay | Admitting: Gastroenterology

## 2022-11-09 ENCOUNTER — Ambulatory Visit: Payer: Medicare HMO | Admitting: Gastroenterology

## 2022-11-09 VITALS — BP 160/90 | HR 70 | Ht 72.0 in | Wt 219.0 lb

## 2022-11-09 DIAGNOSIS — Z1211 Encounter for screening for malignant neoplasm of colon: Secondary | ICD-10-CM

## 2022-11-09 DIAGNOSIS — D135 Benign neoplasm of extrahepatic bile ducts: Secondary | ICD-10-CM | POA: Diagnosis not present

## 2022-11-09 MED ORDER — NA SULFATE-K SULFATE-MG SULF 17.5-3.13-1.6 GM/177ML PO SOLN: 1.0000 | ORAL | 0 refills | Status: DC

## 2022-11-09 NOTE — H&P (View-Only) (Signed)
GASTROENTEROLOGY OUTPATIENT CLINIC VISIT   Primary Care Provider Lovey Newcomer, PA [address] [place] [address] [phone]  Patient Profile: Clifford Jones is a 72 y.o. male with a pmh significant for CAD status post CABG, hypertension, hyperlipidemia, status post AAA repair, tobacco use, GERD, Schatzki ring, ampullary adenoma (s/p endoscopic papillectomy).  The patient presents to the [facility] for an evaluation and management of problem(s) noted below:  Problem List 1. Colon cancer screening   2. Ampullary adenoma     History of Present Illness: Please see prior notes for full details of HPI.  Interval History The patient returns for followup. He is doing well.  He has not had any abdominal pain or discomfort.  No issues from a pancreatitis perspective.  He is wondering, if he does need followup of his upper GI tract this year or as had been plans previously, does ne need it in 2 years.  He is open to colon cancer screening at this time and would like to proceed with colonoscopy.  No blood in his stools has occurred and he has bowel movements regularly.  GI Review of Systems Positive as above Negative for odynophagia, dysphagia, nausea, vomiting, weight loss, change in bowel habits, melena, hematochezia  Review of Systems General: Denies fevers/chills/unintentional weight loss Cardiovascular: Denies chest pain Pulmonary: Denies significant changes in shortness of breath Gastroenterological: See HPI Genitourinary: Denies darkened urine Hematological: Denies easy bruising/bleeding Dermatological: Denies jaundice Psychological: Mood is stable   Medications Current Outpatient Medications  Medication Sig Dispense Refill   acetaminophen (TYLENOL) 500 MG tablet Take 1,000 mg by mouth every 6 (six) hours as needed for mild pain, moderate pain or headache.      albuterol (PROVENTIL) (2.5 MG/3ML) 0.083% nebulizer solution Take 2.5 mg by  nebulization every 4 (four) hours as needed for wheezing or shortness of breath.     amLODipine (NORVASC) 5 MG tablet TAKE 1 TABLET BY MOUTH DAILY 30 tablet 6   ascorbic acid (VITAMIN C) 500 MG tablet Take 1 tablet (500 mg total) by mouth daily. 30 tablet 1   aspirin 81 MG EC tablet Take 1 tablet (81 mg total) by mouth daily. 30 tablet 11   carvedilol (COREG) 3.125 MG tablet TAKE 1 TABLET BY MOUTH TWICE DAILY 60 tablet 6   cycloSPORINE (RESTASIS) 0.05 % ophthalmic emulsion Place 1 drop into both eyes 2 (two) times daily.     furosemide (LASIX) 20 MG tablet Take 20 mg by mouth daily as needed for edema.     ipratropium-albuterol (DUONEB) 0.5-2.5 (3) MG/3ML SOLN Take 3 mLs by nebulization every 6 (six) hours as needed (Wheezing/SOB).     isosorbide mononitrate (IMDUR) 30 MG 24 hr tablet Take 1 tablet (30 mg total) by mouth daily. 90 tablet 1   lisinopril (ZESTRIL) 2.5 MG tablet Take 1 tablet (2.5 mg total) by mouth daily. 60 tablet 1   magnesium hydroxide (MILK OF MAGNESIA) 400 MG/5ML suspension Take 15 mLs by mouth daily as needed for mild constipation. 355 mL 0   Na Sulfate-K Sulfate-Mg Sulf (SUPREP BOWEL PREP KIT) 17.5-3.13-1.6 GM/177ML SOLN Take 1 kit by mouth as directed. For colonoscopy prep 354 mL 0   nitroGLYCERIN (NITROSTAT) 0.4 MG SL tablet Place 1 tablet (0.4 mg total) under the tongue every 5 (five) minutes x 3 doses as needed for chest pain. 25 tablet 4   pantoprazole (PROTONIX) 40 MG tablet Take 40 mg by mouth 2 (two) times daily.  RANEXA 1000 MG SR tablet TAKE ONE TABLET BY MOUTH TWICE DAILY 30 tablet 0   rosuvastatin (CRESTOR) 40 MG tablet Take 1 tablet (40 mg total) by mouth at bedtime. 90 tablet 1   vitamin B-12 (CYANOCOBALAMIN) 1000 MCG tablet Take 1,000 mcg by mouth daily.     warfarin (COUMADIN) 2 MG tablet TAKE 1 TABLET BY MOUTH DAILY EXCEPT 1/2 TABLET ON MONDAYS AND THURSDAYS OR AS DIRECTED 30 tablet 5   zinc gluconate 50 MG tablet Take 50 mg by mouth daily.     No current  facility-administered medications for this visit.    Allergies No Known Allergies  Histories Past Medical History:  Diagnosis Date   Ampullary adenoma    Arthritis    Reported by patient   Cancer [facility])    Reported by patient   Claustrophobia    Closed fracture of right distal tibia    COPD (chronic obstructive pulmonary disease) (HCC)    Reported by patient   Coronary artery disease    a. 1999 Inf MI;  b. 2003 CABGx6;  c. 2008 NSTEMI, occluded SVG to marginal and diagonal system. 2 DES placed to marginal system;  c. NSTEMI - 02/2012 Xience Xpedition DES to oS-OM and POBA to mid lesion;  c. 10/2013 NSTEMI/PCI: VG->OM 80 ISR, native OM 80/90 small, LIMA->LAD ok w/ 90 dLAD, D1 80, RIMA->RPL ok w/ 95 in RPL (2.5x14 Resolute DES), nl EF.   Dyspnea    Reported by patient   GERD (gastroesophageal reflux disease)    Hyperlipidemia    Hypertension    Myocardial infarction (HCC)    X 6   Pneumonia    S/P AAA repair    Tobacco abuse    Type 2 diabetes mellitus without complication, without long-term current use of insulin (HCC)    Wears dentures    Wears glasses    Past Surgical History:  Procedure Laterality Date   ABDOMINAL AORTIC ANEURYSM REPAIR  2006   APPENDECTOMY     BIOPSY  03/08/2021   Procedure: BIOPSY;  Surgeon: Lemar Lofty., MD;  Location: [facility]COPY;  Service: Gastroenterology;;   CATARACT EXTRACTION W/PHACO Right 11/05/2021   Procedure: CATARACT EXTRACTION PHACO AND INTRAOCULAR LENS PLACEMENT (IOC);  Surgeon: Fabio Pierce, MD;  Location: [facility] OR[facility];  Service: Ophthalmology;  Laterality: Right;  CDE: 11.33   CORONARY ANGIOPLASTY WITH STENT PLACEMENT     CORONARY ARTERY BYPASS GRAFT  2003   ENDOSCOPIC RETROGRADE CHOLANGIOPANCREATOGRAPHY (ERCP) WITH PROPOFOL N/A 03/25/2019   Procedure: ENDOSCOPIC RETROGRADE CHOLANGIOPANCREATOGRAPHY (ERCP) WITH PROPOFOL;  Surgeon: Lemar Lofty., MD;  Location[facility] ENDOSCOPY;  Service: Gastroenterology;  Laterality: N/A;    ENDOSCOPIC RETROGRADE CHOLANGIOPANCREATOGRAPHY (ERCP) WITH PROPOFOL N/A 03/08/2021   Procedure: ENDOSCOPIC RETROGRADE CHOLANGIOPANCREATOGRAPHY (ERCP) WITH PROPOFOL;  Surgeon: Meridee Score Netty Starring., MD;  Location: [facility] ENDOSCOPY;  Service: Gastroenterology;  Laterality: N/A;   ENDOSCOPIC RETROGRADE CHOLANGIOPANCREATOGRAPHY (ERCP) WITH PROPOFOL N/A 04/14/2022   Procedure: ENDOSCOPIC RETROGRADE CHOLANGIOPANCREATOGRAPHY (ERCP) WITH PROPOFOL;  Surgeon: Meridee Score Netty Starring., MD;  Location: [facility] ENDOSCOPY;  Service: Gastroenterology;  Laterality: N/A;   ESOPHAGEAL DILATION  07/06/2018   Procedure: ESOPHAGEAL DILATION;  Surgeon: Malissa Hippo, MD;  Location: [facility] SU[facility];  Service: Endoscopy;;   ESOPHAGOGASTRODUODENOSCOPY N/A 07/06/2018   Procedure: ESOPHAGOGASTRODUODENOSCOPY (EGD);  Surgeon: Malissa Hippo, MD;  [facility]: [facility];  Service: Endoscopy;  Laterality: N/A;   ESOPHAGOGASTRODUODENOSCOPY N/A 08/16/2018   Procedure: ESOPHAGOGASTRODUODENOSCOPY (EGD);  Surgeon: Rachael Fee, MD;  Location: [facility] END[facility];  Service: Endoscopy;  Laterality:  N/A;   ESOPHAGOGASTRODUODENOSCOPY (EGD) WITH PROPOFOL N/A 03/25/2019   Procedure: ESOPHAGOGASTRODUODENOSCOPY (EGD) WITH PROPOFOL;  Surgeon: Meridee Score Netty Starring., MD;  Location: [facility] ENDOSCOPY;  Service: Gastroenterology;  Laterality: N/A;   EUS N/A 08/16/2018   Procedure: UPPER ENDOSCOPIC ULTRASOUND (EUS) RADIAL;  Surgeon: Rachael Fee, MD;  Location: [facility] ENDOSCOPY;  Service: Endoscopy;  Laterality: N/A;   EUS N/A 03/25/2019   Procedure: UPPER ENDOSCOPIC ULTRASOUND (EUS) RADIAL;  Surgeon: Lemar Lofty., MD;  Location: [facility] ENDOSCOPY;  Service: Gastroenterology;  Laterality: N/A;   FOREIGN BODY REMOVAL  07/06/2018   Procedure: FOREIGN BODY REMOVAL;  Surgeon: Malissa Hippo, MD;  Location: [facility];  Service: Endoscopy;;   HERNIA REPAIR     umbicical   LEFT HEART CATHETERIZATION WITH CORONARY ANGIOGRAM N/A 03/05/2012   Procedure: LEFT  HEART CATHETERIZATION WITH CORONARY ANGIOGRAM;  Surgeon: Herby Abraham, MD;  Location: [facility] CATH LAB;  Service: Cardiovascular;  Laterality: N/A;   LEFT HEART CATHETERIZATION WITH CORONARY ANGIOGRAM N/A 10/23/2013   Procedure: LEFT HEART CATHETERIZATION WITH CORONARY ANGIOGRAM;  Surgeon: Micheline Chapman, MD;  Location: [facility] CATH LAB;  Service: Cardiovascular;  Laterality: N/A;   LEFT HEART CATHETERIZATION WITH CORONARY/GRAFT ANGIOGRAM  10/22/2013   Procedure: LEFT HEART CATHETERIZATION WITH Isabel Caprice;  Surgeon: Marykay Lex, MD;  Location: [facility] CATH LAB;  Service: Cardiovascular;;   PANCREATIC STENT PLACEMENT  03/25/2019   Procedure: PANCREATIC STENT PLACEMENT;  Surgeon: Meridee Score Netty Starring., MD;  Location: [facility] ENDOSCOPY;  Service: Gastroenterology;;   PANCREATIC STENT PLACEMENT  03/08/2021   Procedure: PANCREATIC STENT PLACEMENT;  Surgeon: Lemar Lofty., MD;  Location: [facility] ENDOSCOPY;  Service: Gastroenterology;;   PANCREATIC STENT PLACEMENT  04/14/2022   Procedure: PANCREATIC STENT PLACEMENT;  Surgeon: Lemar Lofty., MD;  Location: [facility] ENDOSCOPY;  Service: Gastroenterology;;   PERCUTANEOUS CORONARY STENT INTERVENTION (PCI-S) N/A 03/07/2012   Procedure: PERCUTANEOUS CORONARY STENT INTERVENTION (PCI-S);  Surgeon: Tonny Bollman, MD;  Location: [facility] CATH LAB;  Service: Cardiovascular;  Laterality: N/A;   PERCUTANEOUS CORONARY STENT INTERVENTION (PCI-S) Right 10/23/2013   Procedure: PERCUTANEOUS CORONARY STENT INTERVENTION (PCI-S);  Surgeon: Micheline Chapman, MD;  Location: [facility] CATH LAB;  Service: Cardiovascular;  Laterality: Right;   POLYPECTOMY  07/06/2018   Procedure: POLYPECTOMY;  Surgeon: Malissa Hippo, MD;  Location: [facility];  Service: Endoscopy;;  polyp at ampulla   POLYPECTOMY  03/25/2019   Procedure: AMPULLECTOMY;  Surgeon: Mansouraty, Netty Starring., MD;  Location: [facility] ENDOSCOPY;  Service: Gastroenterology;;   REMOVAL OF STONES  03/08/2021   Procedure: REMOVAL OF  STONES;  Surgeon: Lemar Lofty., MD;  Location: [facility] ENDOSCOPY;  Service: Gastroenterology;;   Right hand surgery     RIGHT HEART CATH AND CORONARY/GRAFT ANGIOGRAPHY N/A 12/16/2019   Procedure: RIGHT HEART CATH AND CORONARY/GRAFT ANGIOGRAPHY;  Surgeon: Yvonne Kendall, MD;  Location: [facility] INVASIVE CV LAB;  Service: Cardiovascular;  Laterality: N/A;   SPHINCTEROTOMY  03/25/2019   Procedure: SPHINCTEROTOMY;  Surgeon: Mansouraty, Netty Starring., MD;  Location: [facility] ENDOSCOPY;  Service: Gastroenterology;;   TIBIA IM NAIL INSERTION Right 04/07/2016   Procedure: FIXATION OF RIGHT TIBIA FRACTURE;  Surgeon: Valeria Batman, MD;  Location: [facility] OR;  Service: Orthopedics;  Laterality: Right;   Social History   Socioeconomic History   Marital status: Legally Separated    Spouse name: Not on file   Number of children: Not on file   Years of education: Not on file   Highest education level: Not on file  Occupational History  Not on file  Tobacco Use   Smoking status: Some Days    Packs/day: 1.00    Years: 54.00    Additional pack years: 0.00    Total pack years: 54.00    Types: Cigarettes    Start date: 10/04/1964    Last attempt to quit: 10/2020    Years since quitting: 2.0   Smokeless tobacco: Never   Tobacco comments:    since November 2020-has smoked a total of 6 packs    08/11/2021 Per patient Smoked 3 Packs of cigarettes in the past 8 months  Vaping Use   Vaping Use: Never used  Substance and Sexual Activity   Alcohol use: Yes    Comment: Maybe 2 beers a month and 2 shotsa month   Drug use: No   Sexual activity: Yes  Other Topics Concern   Not on file  Social History Narrative   Disabled.  Former truck Software engineer. Divorced and remarried x 2         Social Determinants of Corporate investment banker Strain: Not on file  Food Insecurity: Not on file  Transportation Needs: Not on file  Physical Activity: Not on file  Stress: Not on file  Social Connections:  Not on file  Intimate Partner Violence: Not on file   Family History  Problem Relation Age of Onset   Coronary artery disease Other    Colon cancer Neg Hx    Esophageal cancer Neg Hx    Inflammatory bowel disease Neg Hx    Liver disease Neg Hx    Pancreatic cancer Neg Hx    Rectal cancer Neg Hx    Stomach cancer Neg Hx    I have reviewed his medical, social, and family history in detail and updated the electronic medical record as necessary.    PHYSICAL EXAMINATION  BP (!) 160/90   Pulse 70   Ht 6' (1.829 m)   Wt 219 lb (99.3 kg)   BMI 29.70 kg/m  GEN: NAD, appears stated age, doesn't appear chronically ill, accompanied by family PSYCH: Cooperative, without pressured speech EYE: Conjunctivae pink, sclerae anicteric ENT: MMM CV: Nontachycardic RESP: No audible wheezing GI: NABS, soft, NT/ND, without rebound MSK/EXT: Lower extremity edema present SKIN: No jaundice NEURO:  Alert & Oriented x 3, no focal deficits   REVIEW OF DATA  I reviewed the following data at the time of this encounter:  GI Procedures and Studies  10/22 ERCP - Prior endoscopic papillectomy with open biliary and pancreatic orifices. - No evidence of any recurrence at ampullary site. - The biliary tree was swept and nothing was found. - The pancreatic tree is normal in appearance. - Pancreatic prophylactic stent placed.  Laboratory Studies  Reviewed in epic  Imaging Studies  12/23 KUB IMPRESSION: 1. Constipation. 2. No free air or obstruction. 3. No stent is seen.   ASSESSMENT  Mr. Gambler is a 72 y.o. male with a pmh significant for CAD status post CABG, hypertension, hyperlipidemia, status post AAA repair, tobacco use, GERD, Schatzki ring, ampullary adenoma (s/p endoscopic papillectomy).  The patient is seen today for evaluation and management of:  1. Colon cancer screening   2. Ampullary adenoma    The patient is hemodynamically and clinically stable.  He will be due for ampullary adenoma  EGD-SV examination in 2025.  He is interested in colon cancer screening and would like to pursue colonoscopy.  The risks and benefits of endoscopic evaluation were discussed with the  patient; these include but are not limited to the risk of perforation, infection, bleeding, missed lesions, lack of diagnosis, severe illness requiring hospitalization, as well as anesthesia and sedation related illnesses.  The patient and/or family is agreeable to proceed.  We will obtain approval for his Coumadin to be held for 5 days prior to procedure from his cardiology service/team.  All patient questions were answered to the best of my ability, and the patient agrees to the aforementioned plan of action with follow-up as indicated.   PLAN  Obtain approval for Coumadin hold per cardiology service Colonoscopy for screening purposes this year EGD-SV examination in 2025 for surveillance of previous ampullary adenoma   Orders Placed This Encounter  Procedures   Ambulatory referral to Gastroenterology    New Prescriptions   NA SULFATE-K SULFATE-MG SULF (SUPREP BOWEL PREP KIT) 17.5-3.13-1.6 GM/177ML SOLN    Take 1 kit by mouth as directed. For colonoscopy prep   Modified Medications   No medications on file    Planned Follow Up: No follow-ups on file.   Total Time in Face-to-Face and in Coordination of Care for patient including independent/personal interpretation/review of prior testing, medical history, examination, medication adjustment, communicating results with the patient directly, and documentation with the EHR is 25 minutes.   Corliss Parish, MD [facility] Office # [phone]

## 2022-11-09 NOTE — Patient Instructions (Addendum)
You have been scheduled for a colonoscopy. Please follow written instructions given to you at your visit today.  Please pick up your prep supplies at the pharmacy within the next 1-3 days. If you use inhalers (even only as needed), please bring them with you on the day of your procedure.  We have sent the following medications to your pharmacy for you to pick up at your convenience: Suprep   _______________________________________________________  If your blood pressure at your visit was 140/90 or greater, please contact your primary care physician to follow up on this.  _______________________________________________________  If you are age 70 or older, your body mass index should be between 23-30. Your Body mass index is 29.7 kg/m. If this is out of the aforementioned range listed, please consider follow up with your Primary Care Provider.  If you are age 18 or younger, your body mass index should be between 19-25. Your Body mass index is 29.7 kg/m. If this is out of the aformentioned range listed, please consider follow up with your Primary Care Provider.   ________________________________________________________  The [facility] providers would like to encourage you to use [facility] to communicate with providers for non-urgent requests or questions.  Due to long hold times on the telephone, sending your provider a message by [facility] may be a faster and more efficient way to get a response.  Please allow 48 business hours for a response.  Please remember that this is for non-urgent requests.  _______________________________________________________  Bonita Quin will be contaced by our office prior to your procedure for directions on holding your Coumadin/Warfarin.  If you do not hear from our office 1 week prior to your scheduled procedure, please call [phone] to discuss.  Thank you for choosing me and [facility].  Dr. Meridee Score

## 2022-11-09 NOTE — Telephone Encounter (Signed)
Request for surgical clearance:     Endoscopy Procedure  What type of surgery is being performed?    Colonoscopy   When is this surgery scheduled?     11/28/22  What type of clearance is required ?   Pharmacy  Are there any medications that need to be held prior to surgery and how long? Coumadin x 5 days prior to procedure   Practice name and name of physician performing surgery?       Gastroenterology-Dr Mansouraty   What is your office phone and fax number?      Phone- [phone]  Fax- [phone]  Anesthesia type (None, local, MAC, general) ?       MAC

## 2022-11-09 NOTE — Progress Notes (Unsigned)
GASTROENTEROLOGY OUTPATIENT CLINIC VISIT   Primary Care Provider Lovey Newcomer, PA [address] [place] [address] [phone]  Patient Profile: Clifford Jones is a 72 y.o. male with a pmh significant for CAD status post CABG, hypertension, hyperlipidemia, status post AAA repair, tobacco use, GERD, Schatzki ring, ampullary adenoma (s/p endoscopic papillectomy).  The patient presents to the [facility] for an evaluation and management of problem(s) noted below:  Problem List 1. Colon cancer screening   2. Ampullary adenoma     History of Present Illness: Please see prior notes for full details of HPI.  Interval History The patient returns for followup. He is doing well.  He has not had any abdominal pain or discomfort.  No issues from a pancreatitis perspective.  He is wondering, if he does need followup of his upper GI tract this year or as had been plans previously, does ne need it in 2 years.  He is open to colon cancer screening at this time and would like to proceed with colonoscopy.  No blood in his stools has occurred and he has bowel movements regularly.  GI Review of Systems Positive as above Negative for odynophagia, dysphagia, nausea, vomiting, weight loss, change in bowel habits, melena, hematochezia  Review of Systems General: Denies fevers/chills/unintentional weight loss Cardiovascular: Denies chest pain Pulmonary: Denies significant changes in shortness of breath Gastroenterological: See HPI Genitourinary: Denies darkened urine Hematological: Denies easy bruising/bleeding Dermatological: Denies jaundice Psychological: Mood is stable   Medications Current Outpatient Medications  Medication Sig Dispense Refill   acetaminophen (TYLENOL) 500 MG tablet Take 1,000 mg by mouth every 6 (six) hours as needed for mild pain, moderate pain or headache.      albuterol (PROVENTIL) (2.5 MG/3ML) 0.083% nebulizer solution Take 2.5 mg by  nebulization every 4 (four) hours as needed for wheezing or shortness of breath.     amLODipine (NORVASC) 5 MG tablet TAKE 1 TABLET BY MOUTH DAILY 30 tablet 6   ascorbic acid (VITAMIN C) 500 MG tablet Take 1 tablet (500 mg total) by mouth daily. 30 tablet 1   aspirin 81 MG EC tablet Take 1 tablet (81 mg total) by mouth daily. 30 tablet 11   carvedilol (COREG) 3.125 MG tablet TAKE 1 TABLET BY MOUTH TWICE DAILY 60 tablet 6   cycloSPORINE (RESTASIS) 0.05 % ophthalmic emulsion Place 1 drop into both eyes 2 (two) times daily.     furosemide (LASIX) 20 MG tablet Take 20 mg by mouth daily as needed for edema.     ipratropium-albuterol (DUONEB) 0.5-2.5 (3) MG/3ML SOLN Take 3 mLs by nebulization every 6 (six) hours as needed (Wheezing/SOB).     isosorbide mononitrate (IMDUR) 30 MG 24 hr tablet Take 1 tablet (30 mg total) by mouth daily. 90 tablet 1   lisinopril (ZESTRIL) 2.5 MG tablet Take 1 tablet (2.5 mg total) by mouth daily. 60 tablet 1   magnesium hydroxide (MILK OF MAGNESIA) 400 MG/5ML suspension Take 15 mLs by mouth daily as needed for mild constipation. 355 mL 0   Na Sulfate-K Sulfate-Mg Sulf (SUPREP BOWEL PREP KIT) 17.5-3.13-1.6 GM/177ML SOLN Take 1 kit by mouth as directed. For colonoscopy prep 354 mL 0   nitroGLYCERIN (NITROSTAT) 0.4 MG SL tablet Place 1 tablet (0.4 mg total) under the tongue every 5 (five) minutes x 3 doses as needed for chest pain. 25 tablet 4   pantoprazole (PROTONIX) 40 MG tablet Take 40 mg by mouth 2 (two) times daily.  RANEXA 1000 MG SR tablet TAKE ONE TABLET BY MOUTH TWICE DAILY 30 tablet 0   rosuvastatin (CRESTOR) 40 MG tablet Take 1 tablet (40 mg total) by mouth at bedtime. 90 tablet 1   vitamin B-12 (CYANOCOBALAMIN) 1000 MCG tablet Take 1,000 mcg by mouth daily.     warfarin (COUMADIN) 2 MG tablet TAKE 1 TABLET BY MOUTH DAILY EXCEPT 1/2 TABLET ON MONDAYS AND THURSDAYS OR AS DIRECTED 30 tablet 5   zinc gluconate 50 MG tablet Take 50 mg by mouth daily.     No current  facility-administered medications for this visit.    Allergies No Known Allergies  Histories Past Medical History:  Diagnosis Date   Ampullary adenoma    Arthritis    Reported by patient   Cancer [facility])    Reported by patient   Claustrophobia    Closed fracture of right distal tibia    COPD (chronic obstructive pulmonary disease) (HCC)    Reported by patient   Coronary artery disease    a. 1999 Inf MI;  b. 2003 CABGx6;  c. 2008 NSTEMI, occluded SVG to marginal and diagonal system. 2 DES placed to marginal system;  c. NSTEMI - 02/2012 Xience Xpedition DES to oS-OM and POBA to mid lesion;  c. 10/2013 NSTEMI/PCI: VG->OM 80 ISR, native OM 80/90 small, LIMA->LAD ok w/ 90 dLAD, D1 80, RIMA->RPL ok w/ 95 in RPL (2.5x14 Resolute DES), nl EF.   Dyspnea    Reported by patient   GERD (gastroesophageal reflux disease)    Hyperlipidemia    Hypertension    Myocardial infarction (HCC)    X 6   Pneumonia    S/P AAA repair    Tobacco abuse    Type 2 diabetes mellitus without complication, without long-term current use of insulin (HCC)    Wears dentures    Wears glasses    Past Surgical History:  Procedure Laterality Date   ABDOMINAL AORTIC ANEURYSM REPAIR  2006   APPENDECTOMY     BIOPSY  03/08/2021   Procedure: BIOPSY;  Surgeon: Lemar Lofty., MD;  Location: [facility]COPY;  Service: Gastroenterology;;   CATARACT EXTRACTION W/PHACO Right 11/05/2021   Procedure: CATARACT EXTRACTION PHACO AND INTRAOCULAR LENS PLACEMENT (IOC);  Surgeon: Fabio Pierce, MD;  Location: [facility];  Service: Ophthalmology;  Laterality: Right;  CDE: 11.33   CORONARY ANGIOPLASTY WITH STENT PLACEMENT     CORONARY ARTERY BYPASS GRAFT  2003   ENDOSCOPIC RETROGRADE CHOLANGIOPANCREATOGRAPHY (ERCP) WITH PROPOFOL N/A 03/25/2019   Procedure: ENDOSCOPIC RETROGRADE CHOLANGIOPANCREATOGRAPHY (ERCP) WITH PROPOFOL;  Surgeon: Lemar Lofty., MD;  Location[facility] ENDOSCOPY;  Service: Gastroenterology;  Laterality: N/A;    ENDOSCOPIC RETROGRADE CHOLANGIOPANCREATOGRAPHY (ERCP) WITH PROPOFOL N/A 03/08/2021   Procedure: ENDOSCOPIC RETROGRADE CHOLANGIOPANCREATOGRAPHY (ERCP) WITH PROPOFOL;  Surgeon: Meridee Score Netty Starring., MD;  Location: [facility] ENDOSCOPY;  Service: Gastroenterology;  Laterality: N/A;   ENDOSCOPIC RETROGRADE CHOLANGIOPANCREATOGRAPHY (ERCP) WITH PROPOFOL N/A 04/14/2022   Procedure: ENDOSCOPIC RETROGRADE CHOLANGIOPANCREATOGRAPHY (ERCP) WITH PROPOFOL;  Surgeon: Meridee Score Netty Starring., MD;  Location: [facility] ENDOSCOPY;  Service: Gastroenterology;  Laterality: N/A;   ESOPHAGEAL DILATION  07/06/2018   Procedure: ESOPHAGEAL DILATION;  Surgeon: Malissa Hippo, MD;  Location: [facility];  Service: Endoscopy;;   ESOPHAGOGASTRODUODENOSCOPY N/A 07/06/2018   Procedure: ESOPHAGOGASTRODUODENOSCOPY (EGD);  Surgeon: Malissa Hippo, MD;  Location: [facility];  Service: Endoscopy;  Laterality: N/A;   ESOPHAGOGASTRODUODENOSCOPY N/A 08/16/2018   Procedure: ESOPHAGOGASTRODUODENOSCOPY (EGD);  Surgeon: Rachael Fee, MD;  Location: [facility];  Service: Endoscopy;  Laterality:  N/A;   ESOPHAGOGASTRODUODENOSCOPY (EGD) WITH PROPOFOL N/A 03/25/2019   Procedure: ESOPHAGOGASTRODUODENOSCOPY (EGD) WITH PROPOFOL;  Surgeon: Meridee Score Netty Starring., MD;  Location: [facility] ENDOSCOPY;  Service: Gastroenterology;  Laterality: N/A;   EUS N/A 08/16/2018   Procedure: UPPER ENDOSCOPIC ULTRASOUND (EUS) RADIAL;  Surgeon: Rachael Fee, MD;  Location: [facility] ENDOSCOPY;  Service: Endoscopy;  Laterality: N/A;   EUS N/A 03/25/2019   Procedure: UPPER ENDOSCOPIC ULTRASOUND (EUS) RADIAL;  Surgeon: Lemar Lofty., MD;  Location: [facility] ENDOSCOPY;  Service: Gastroenterology;  Laterality: N/A;   FOREIGN BODY REMOVAL  07/06/2018   Procedure: FOREIGN BODY REMOVAL;  Surgeon: Malissa Hippo, MD;  Location: [facility];  Service: Endoscopy;;   HERNIA REPAIR     umbicical   LEFT HEART CATHETERIZATION WITH CORONARY ANGIOGRAM N/A 03/05/2012   Procedure: LEFT  HEART CATHETERIZATION WITH CORONARY ANGIOGRAM;  Surgeon: Herby Abraham, MD;  Location: [facility] CATH LAB;  Service: Cardiovascular;  Laterality: N/A;   LEFT HEART CATHETERIZATION WITH CORONARY ANGIOGRAM N/A 10/23/2013   Procedure: LEFT HEART CATHETERIZATION WITH CORONARY ANGIOGRAM;  Surgeon: Micheline Chapman, MD;  Location: [facility] CATH LAB;  Service: Cardiovascular;  Laterality: N/A;   LEFT HEART CATHETERIZATION WITH CORONARY/GRAFT ANGIOGRAM  10/22/2013   Procedure: LEFT HEART CATHETERIZATION WITH Isabel Caprice;  Surgeon: Marykay Lex, MD;  Location: [facility] CATH LAB;  Service: Cardiovascular;;   PANCREATIC STENT PLACEMENT  03/25/2019   Procedure: PANCREATIC STENT PLACEMENT;  Surgeon: Meridee Score Netty Starring., MD;  Location: [facility] ENDOSCOPY;  Service: Gastroenterology;;   PANCREATIC STENT PLACEMENT  03/08/2021   Procedure: PANCREATIC STENT PLACEMENT;  Surgeon: Lemar Lofty., MD;  Location: [facility] ENDOSCOPY;  Service: Gastroenterology;;   PANCREATIC STENT PLACEMENT  04/14/2022   Procedure: PANCREATIC STENT PLACEMENT;  Surgeon: Lemar Lofty., MD;  Location: [facility] ENDOSCOPY;  Service: Gastroenterology;;   PERCUTANEOUS CORONARY STENT INTERVENTION (PCI-S) N/A 03/07/2012   Procedure: PERCUTANEOUS CORONARY STENT INTERVENTION (PCI-S);  Surgeon: Tonny Bollman, MD;  Location: [facility] CATH LAB;  Service: Cardiovascular;  Laterality: N/A;   PERCUTANEOUS CORONARY STENT INTERVENTION (PCI-S) Right 10/23/2013   Procedure: PERCUTANEOUS CORONARY STENT INTERVENTION (PCI-S);  Surgeon: Micheline Chapman, MD;  Location: [facility] CATH LAB;  Service: Cardiovascular;  Laterality: Right;   POLYPECTOMY  07/06/2018   Procedure: POLYPECTOMY;  Surgeon: Malissa Hippo, MD;  Location: [facility] END[facility];  Service: Endoscopy;;  polyp at ampulla   POLYPECTOMY  03/25/2019   Procedure: AMPULLECTOMY;  Surgeon: Mansouraty, Netty Starring., MD;  Location: [facility] ENDOSCOPY;  Service: Gastroenterology;;   REMOVAL OF STONES  03/08/2021   Procedure: REMOVAL OF  STONES;  Surgeon: Lemar Lofty., MD;  Location: [facility] ENDOSCOPY;  Service: Gastroenterology;;   Right hand surgery     RIGHT HEART CATH AND CORONARY/GRAFT ANGIOGRAPHY N/A 12/16/2019   Procedure: RIGHT HEART CATH AND CORONARY/GRAFT ANGIOGRAPHY;  Surgeon: Yvonne Kendall, MD;  Location: [facility] INVASIVE CV LAB;  Service: Cardiovascular;  Laterality: N/A;   SPHINCTEROTOMY  03/25/2019   Procedure: SPHINCTEROTOMY;  Surgeon: Mansouraty, Netty Starring., MD;  Location: [facility] ENDOSCOPY;  Service: Gastroenterology;;   TIBIA IM NAIL INSERTION Right 04/07/2016   Procedure: FIXATION OF RIGHT TIBIA FRACTURE;  Surgeon: Valeria Batman, MD;  Location: [facility] OR;  Service: Orthopedics;  Laterality: Right;   Social History   Socioeconomic History   Marital status: Legally Separated    Spouse name: Not on file   Number of children: Not on file   Years of education: Not on file   Highest education level: Not on file  Occupational History  Not on file  Tobacco Use   Smoking status: Some Days    Packs/day: 1.00    Years: 54.00    Additional pack years: 0.00    Total pack years: 54.00    Types: Cigarettes    Start date: 10/04/1964    Last attempt to quit: 10/2020    Years since quitting: 2.0   Smokeless tobacco: Never   Tobacco comments:    since November 2020-has smoked a total of 6 packs    08/11/2021 Per patient Smoked 3 Packs of cigarettes in the past 8 months  Vaping Use   Vaping Use: Never used  Substance and Sexual Activity   Alcohol use: Yes    Comment: Maybe 2 beers a month and 2 shotsa month   Drug use: No   Sexual activity: Yes  Other Topics Concern   Not on file  Social History Narrative   Disabled.  Former truck Software engineer. Divorced and remarried x 2         Social Determinants of Corporate investment banker Strain: Not on file  Food Insecurity: Not on file  Transportation Needs: Not on file  Physical Activity: Not on file  Stress: Not on file  Social Connections:  Not on file  Intimate Partner Violence: Not on file   Family History  Problem Relation Age of Onset   Coronary artery disease Other    Colon cancer Neg Hx    Esophageal cancer Neg Hx    Inflammatory bowel disease Neg Hx    Liver disease Neg Hx    Pancreatic cancer Neg Hx    Rectal cancer Neg Hx    Stomach cancer Neg Hx    I have reviewed his medical, social, and family history in detail and updated the electronic medical record as necessary.    PHYSICAL EXAMINATION  BP (!) 160/90   Pulse 70   Ht 6' (1.829 m)   Wt 219 lb (99.3 kg)   BMI 29.70 kg/m  GEN: NAD, appears stated age, doesn't appear chronically ill, accompanied by family PSYCH: Cooperative, without pressured speech EYE: Conjunctivae pink, sclerae anicteric ENT: MMM CV: Nontachycardic RESP: No audible wheezing GI: NABS, soft, NT/ND, without rebound MSK/EXT: Lower extremity edema present SKIN: No jaundice NEURO:  Alert & Oriented x 3, no focal deficits   REVIEW OF DATA  I reviewed the following data at the time of this encounter:  GI Procedures and Studies  10/22 ERCP - Prior endoscopic papillectomy with open biliary and pancreatic orifices. - No evidence of any recurrence at ampullary site. - The biliary tree was swept and nothing was found. - The pancreatic tree is normal in appearance. - Pancreatic prophylactic stent placed.  Laboratory Studies  Reviewed in epic  Imaging Studies  12/23 KUB IMPRESSION: 1. Constipation. 2. No free air or obstruction. 3. No stent is seen.   ASSESSMENT  Mr. Gambler is a 72 y.o. male with a pmh significant for CAD status post CABG, hypertension, hyperlipidemia, status post AAA repair, tobacco use, GERD, Schatzki ring, ampullary adenoma (s/p endoscopic papillectomy).  The patient is seen today for evaluation and management of:  1. Colon cancer screening   2. Ampullary adenoma    The patient is hemodynamically and clinically stable.  He will be due for ampullary adenoma  EGD-SV examination in 2025.  He is interested in colon cancer screening and would like to pursue colonoscopy.  The risks and benefits of endoscopic evaluation were discussed with the  patient; these include but are not limited to the risk of perforation, infection, bleeding, missed lesions, lack of diagnosis, severe illness requiring hospitalization, as well as anesthesia and sedation related illnesses.  The patient and/or family is agreeable to proceed.  We will obtain approval for his Coumadin to be held for 5 days prior to procedure from his cardiology service/team.  All patient questions were answered to the best of my ability, and the patient agrees to the aforementioned plan of action with follow-up as indicated.   PLAN  Obtain approval for Coumadin hold per cardiology service Colonoscopy for screening purposes this year EGD-SV examination in 2025 for surveillance of previous ampullary adenoma   Orders Placed This Encounter  Procedures   Ambulatory referral to Gastroenterology    New Prescriptions   NA SULFATE-K SULFATE-MG SULF (SUPREP BOWEL PREP KIT) 17.5-3.13-1.6 GM/177ML SOLN    Take 1 kit by mouth as directed. For colonoscopy prep   Modified Medications   No medications on file    Planned Follow Up: No follow-ups on file.   Total Time in Face-to-Face and in Coordination of Care for patient including independent/personal interpretation/review of prior testing, medical history, examination, medication adjustment, communicating results with the patient directly, and documentation with the EHR is 25 minutes.   Corliss Parish, MD [facility] Office # [phone]

## 2022-11-10 ENCOUNTER — Ambulatory Visit: Payer: Medicare HMO | Attending: Cardiology | Admitting: *Deleted

## 2022-11-10 ENCOUNTER — Encounter: Payer: Self-pay | Admitting: Gastroenterology

## 2022-11-10 DIAGNOSIS — Z1211 Encounter for screening for malignant neoplasm of colon: Secondary | ICD-10-CM | POA: Insufficient documentation

## 2022-11-10 DIAGNOSIS — D135 Benign neoplasm of extrahepatic bile ducts: Secondary | ICD-10-CM | POA: Insufficient documentation

## 2022-11-10 DIAGNOSIS — Z5181 Encounter for therapeutic drug level monitoring: Secondary | ICD-10-CM | POA: Diagnosis not present

## 2022-11-10 DIAGNOSIS — I513 Intracardiac thrombosis, not elsewhere classified: Secondary | ICD-10-CM | POA: Diagnosis not present

## 2022-11-10 LAB — POCT INR: INR: 4.2 — AB (ref 2.0–3.0)

## 2022-11-10 NOTE — Telephone Encounter (Signed)
Patient with diagnosis of LV thrombus with high risk of recurrence on warfarin for anticoagulation.    Procedure: colonoscopy Date of procedure: 11/28/22   CrCl 98 ml/min Platelet count 216K   Per office protocol, patient can hold warfarin for 5 days prior to procedure.    Patient will not need bridging with Lovenox (enoxaparin) around procedure.  **This guidance is not considered finalized until pre-operative APP has relayed final recommendations.**

## 2022-11-10 NOTE — Telephone Encounter (Signed)
   Patient Name: Clifford Jones  DOB: [birth year] MRN: [id number]  Primary Cardiologist: Dina Rich, MD  Clinical pharmacists have reviewed the patient's past medical history, labs, and current medications as part of preoperative protocol coverage. The following recommendations have been made:  Per office protocol, patient can hold warfarin for 5 days prior to procedure.     Patient will not need bridging with Lovenox (enoxaparin) around procedure.   I will route this recommendation to the requesting party via Epic fax function and remove from pre-op pool.  Please call with questions.  Napoleon Form, Leodis Rains, NP 11/10/2022, 10:52 AM

## 2022-11-11 ENCOUNTER — Encounter: Payer: Self-pay | Admitting: Gastroenterology

## 2022-11-11 DIAGNOSIS — E119 Type 2 diabetes mellitus without complications: Secondary | ICD-10-CM | POA: Diagnosis not present

## 2022-11-11 DIAGNOSIS — H353123 Nonexudative age-related macular degeneration, left eye, advanced atrophic without subfoveal involvement: Secondary | ICD-10-CM | POA: Diagnosis not present

## 2022-11-11 DIAGNOSIS — H353114 Nonexudative age-related macular degeneration, right eye, advanced atrophic with subfoveal involvement: Secondary | ICD-10-CM | POA: Diagnosis not present

## 2022-11-11 DIAGNOSIS — H2512 Age-related nuclear cataract, left eye: Secondary | ICD-10-CM | POA: Diagnosis not present

## 2022-11-11 DIAGNOSIS — H524 Presbyopia: Secondary | ICD-10-CM | POA: Diagnosis not present

## 2022-11-11 DIAGNOSIS — Z961 Presence of intraocular lens: Secondary | ICD-10-CM | POA: Diagnosis not present

## 2022-11-15 LAB — POCT INR: INR: 4.2 — AB (ref 2.0–3.0)

## 2022-11-15 NOTE — Patient Instructions (Signed)
Hold warfarin tonight, take 1/2 tablet tomorrow night then resume 1 tablet daily except 1/2 tablet on Mondays and Thursdays Recheck INR in 3 wks Been on Doxy for tick bite.  Finished now.

## 2022-11-17 DIAGNOSIS — H43812 Vitreous degeneration, left eye: Secondary | ICD-10-CM | POA: Diagnosis not present

## 2022-11-17 DIAGNOSIS — H353114 Nonexudative age-related macular degeneration, right eye, advanced atrophic with subfoveal involvement: Secondary | ICD-10-CM | POA: Diagnosis not present

## 2022-11-17 DIAGNOSIS — H353124 Nonexudative age-related macular degeneration, left eye, advanced atrophic with subfoveal involvement: Secondary | ICD-10-CM | POA: Diagnosis not present

## 2022-11-21 ENCOUNTER — Encounter (HOSPITAL_COMMUNITY): Payer: Self-pay | Admitting: Gastroenterology

## 2022-11-28 ENCOUNTER — Ambulatory Visit (HOSPITAL_BASED_OUTPATIENT_CLINIC_OR_DEPARTMENT_OTHER): Payer: Medicare HMO | Admitting: Anesthesiology

## 2022-11-28 ENCOUNTER — Encounter (HOSPITAL_COMMUNITY)
Admission: RE | Disposition: A | Discharge status: Home / Self Care | Payer: Self-pay | Source: Home / Self Care | Attending: Gastroenterology

## 2022-11-28 ENCOUNTER — Encounter (HOSPITAL_COMMUNITY): Payer: Self-pay | Admitting: Gastroenterology

## 2022-11-28 ENCOUNTER — Other Ambulatory Visit: Payer: Self-pay

## 2022-11-28 ENCOUNTER — Ambulatory Visit (HOSPITAL_COMMUNITY): Payer: Medicare HMO | Admitting: Anesthesiology

## 2022-11-28 ENCOUNTER — Ambulatory Visit (HOSPITAL_COMMUNITY)
Admission: RE | Admit: 2022-11-28 | Discharge: 2022-11-28 | Disposition: A | Discharge status: Home / Self Care | Payer: Medicare HMO | Attending: Gastroenterology | Admitting: Gastroenterology

## 2022-11-28 DIAGNOSIS — D125 Benign neoplasm of sigmoid colon: Secondary | ICD-10-CM | POA: Diagnosis not present

## 2022-11-28 DIAGNOSIS — K641 Second degree hemorrhoids: Secondary | ICD-10-CM | POA: Diagnosis not present

## 2022-11-28 DIAGNOSIS — D123 Benign neoplasm of transverse colon: Secondary | ICD-10-CM | POA: Diagnosis not present

## 2022-11-28 DIAGNOSIS — Z7901 Long term (current) use of anticoagulants: Secondary | ICD-10-CM | POA: Insufficient documentation

## 2022-11-28 DIAGNOSIS — Z1211 Encounter for screening for malignant neoplasm of colon: Secondary | ICD-10-CM | POA: Diagnosis not present

## 2022-11-28 DIAGNOSIS — K644 Residual hemorrhoidal skin tags: Secondary | ICD-10-CM | POA: Diagnosis not present

## 2022-11-28 DIAGNOSIS — K219 Gastro-esophageal reflux disease without esophagitis: Secondary | ICD-10-CM | POA: Diagnosis not present

## 2022-11-28 DIAGNOSIS — D135 Benign neoplasm of extrahepatic bile ducts: Secondary | ICD-10-CM

## 2022-11-28 DIAGNOSIS — Z951 Presence of aortocoronary bypass graft: Secondary | ICD-10-CM | POA: Diagnosis not present

## 2022-11-28 DIAGNOSIS — E785 Hyperlipidemia, unspecified: Secondary | ICD-10-CM | POA: Insufficient documentation

## 2022-11-28 DIAGNOSIS — D120 Benign neoplasm of cecum: Secondary | ICD-10-CM | POA: Diagnosis not present

## 2022-11-28 DIAGNOSIS — I2510 Atherosclerotic heart disease of native coronary artery without angina pectoris: Secondary | ICD-10-CM | POA: Diagnosis not present

## 2022-11-28 DIAGNOSIS — D122 Benign neoplasm of ascending colon: Secondary | ICD-10-CM

## 2022-11-28 DIAGNOSIS — K5730 Diverticulosis of large intestine without perforation or abscess without bleeding: Secondary | ICD-10-CM | POA: Insufficient documentation

## 2022-11-28 DIAGNOSIS — I10 Essential (primary) hypertension: Secondary | ICD-10-CM | POA: Insufficient documentation

## 2022-11-28 DIAGNOSIS — D126 Benign neoplasm of colon, unspecified: Secondary | ICD-10-CM | POA: Diagnosis not present

## 2022-11-28 DIAGNOSIS — D127 Benign neoplasm of rectosigmoid junction: Secondary | ICD-10-CM | POA: Insufficient documentation

## 2022-11-28 DIAGNOSIS — D124 Benign neoplasm of descending colon: Secondary | ICD-10-CM | POA: Diagnosis not present

## 2022-11-28 DIAGNOSIS — Z9889 Other specified postprocedural states: Secondary | ICD-10-CM

## 2022-11-28 DIAGNOSIS — K222 Esophageal obstruction: Secondary | ICD-10-CM | POA: Insufficient documentation

## 2022-11-28 HISTORY — PX: HEMOSTASIS CLIP PLACEMENT: SHX6857

## 2022-11-28 HISTORY — PX: ENDOSCOPIC MUCOSAL RESECTION: SHX6839

## 2022-11-28 HISTORY — PX: COLONOSCOPY WITH PROPOFOL: SHX5780

## 2022-11-28 HISTORY — PX: SUBMUCOSAL TATTOO INJECTION: SHX6856

## 2022-11-28 HISTORY — PX: POLYPECTOMY: SHX5525

## 2022-11-28 HISTORY — PX: SUBMUCOSAL LIFTING INJECTION: SHX6855

## 2022-11-28 SURGERY — COLONOSCOPY WITH PROPOFOL
Anesthesia: Monitor Anesthesia Care

## 2022-11-28 MED ORDER — WARFARIN SODIUM 2 MG PO TABS: ORAL_TABLET | ORAL | 5 refills | Status: DC

## 2022-11-28 MED ORDER — SODIUM CHLORIDE 0.9 % IV SOLN: INTRAVENOUS | Status: DC

## 2022-11-28 MED ORDER — PROPOFOL 1000 MG/100ML IV EMUL
INTRAVENOUS | Status: AC
  Filled 2022-11-28: qty 100

## 2022-11-28 MED ORDER — SPOT INK MARKER SYRINGE KIT
PACK | SUBMUCOSAL | Status: DC | PRN
  Administered 2022-11-28: 2 mL via SUBMUCOSAL

## 2022-11-28 MED ORDER — LACTATED RINGERS IV SOLN: INTRAVENOUS | Status: DC

## 2022-11-28 MED ORDER — PROPOFOL 10 MG/ML IV BOLUS
INTRAVENOUS | Status: DC | PRN
  Administered 2022-11-28: 30 mg via INTRAVENOUS

## 2022-11-28 MED ORDER — PROPOFOL 500 MG/50ML IV EMUL
INTRAVENOUS | Status: DC | PRN
  Administered 2022-11-28: 125 ug/kg/min via INTRAVENOUS

## 2022-11-28 MED ORDER — SODIUM CHLORIDE 0.9 % IV SOLN
1.5000 g | Freq: Once | INTRAVENOUS | Status: DC
  Filled 2022-11-28: qty 4

## 2022-11-28 MED ORDER — PROPOFOL 500 MG/50ML IV EMUL
INTRAVENOUS | Status: AC
  Filled 2022-11-28: qty 50

## 2022-11-28 MED ORDER — SPOT INK MARKER SYRINGE KIT
PACK | SUBMUCOSAL | Status: AC
  Filled 2022-11-28: qty 5

## 2022-11-28 SURGICAL SUPPLY — 22 items

## 2022-11-28 NOTE — Anesthesia Procedure Notes (Signed)
Procedure Name: MAC Date/Time: 11/28/2022 11:00 AM  Performed by: Nelle Don, CRNAPre-anesthesia Checklist: Patient identified, Emergency Drugs available, Suction available and Patient being monitored Oxygen Delivery Method: Simple face mask

## 2022-11-28 NOTE — Discharge Instructions (Signed)
YOU HAD AN ENDOSCOPIC PROCEDURE TODAY: Refer to the procedure report and other information in the discharge instructions given to you for any specific questions about what was found during the examination. If this information does not answer your questions, please call [place] office at [phone] to clarify.  ° °YOU SHOULD EXPECT: Some feelings of bloating in the abdomen. Passage of more gas than usual. Walking can help get rid of the air that was put into your GI tract during the procedure and reduce the bloating. If you had a lower endoscopy (such as a colonoscopy or flexible sigmoidoscopy) you may notice spotting of blood in your stool or on the toilet paper. Some abdominal soreness may be present for a day or two, also. ° °DIET: Your first meal following the procedure should be a light meal and then it is ok to progress to your normal diet. A half-sandwich or bowl of soup is an example of a good first meal. Heavy or fried foods are harder to digest and may make you feel nauseous or bloated. Drink plenty of fluids but you should avoid alcoholic beverages for 24 hours. If you had a esophageal dilation, please see attached instructions for diet.   ° °ACTIVITY: Your care partner should take you home directly after the procedure. You should plan to take it easy, moving slowly for the rest of the day. You can resume normal activity the day after the procedure however YOU SHOULD NOT DRIVE, use power tools, machinery or perform tasks that involve climbing or major physical exertion for 24 hours (because of the sedation medicines used during the test).  ° °SYMPTOMS TO REPORT IMMEDIATELY: °A gastroenterologist can be reached at any hour. Please call [phone]  for any of the following symptoms:  °Following lower endoscopy (colonoscopy, flexible sigmoidoscopy) °Excessive amounts of blood in the stool  °Significant tenderness, worsening of abdominal pains  °Swelling of the abdomen that is new, acute  °Fever of 100° or  higher  °Following upper endoscopy (EGD, EUS, ERCP, esophageal dilation) °Vomiting of blood or coffee ground material  °New, significant abdominal pain  °New, significant chest pain or pain under the shoulder blades  °Painful or persistently difficult swallowing  °New shortness of breath  °Black, tarry-looking or red, bloody stools ° °FOLLOW UP:  °If any biopsies were taken you will be contacted by phone or by letter within the next 1-3 weeks. Call [phone]  if you have not heard about the biopsies in 3 weeks.  °Please also call with any specific questions about appointments or follow up tests. ° °

## 2022-11-28 NOTE — Anesthesia Preprocedure Evaluation (Addendum)
Anesthesia Evaluation  Patient identified by MRN, date of birth, ID band Patient awake    Reviewed: Allergy & Precautions, NPO status , Patient's Chart, lab work & pertinent test results  History of Anesthesia Complications Negative for: history of anesthetic complications  Airway Mallampati: I  TM Distance: >3 FB Neck ROM: Full    Dental no notable dental hx. (+) Edentulous Lower, Edentulous Upper   Pulmonary COPD, Current Smoker and Patient abstained from smoking.   Pulmonary exam normal breath sounds clear to auscultation       Cardiovascular hypertension, Pt. on medications + CAD, + Past MI, + Cardiac Stents, + CABG and + Peripheral Vascular Disease  Normal cardiovascular exam Rhythm:Regular Rate:Normal  TTE 2022: EF 50-55%, RWMAs present, mild LVH   Neuro/Psych   Anxiety     negative neurological ROS     GI/Hepatic Neg liver ROS,GERD  Medicated,,  Endo/Other  diabetes, Type 2    Renal/GU negative Renal ROS     Musculoskeletal  (+) Arthritis ,    Abdominal   Peds  Hematology negative hematology ROS (+)   Anesthesia Other Findings Day of surgery medications reviewed with patient.  Reproductive/Obstetrics                             Anesthesia Physical Anesthesia Plan  ASA: 3  Anesthesia Plan: MAC   Post-op Pain Management: Minimal or no pain anticipated   Induction:   PONV Risk Score and Plan: 1 and Treatment may vary due to age or medical condition and Propofol infusion  Airway Management Planned: Natural Airway and Nasal Cannula  Additional Equipment: None  Intra-op Plan:   Post-operative Plan:   Informed Consent: I have reviewed the patients History and Physical, chart, labs and discussed the procedure including the risks, benefits and alternatives for the proposed anesthesia with the patient or authorized representative who has indicated his/her understanding and  acceptance.     Dental advisory given  Plan Discussed with:   Anesthesia Plan Comments:        Anesthesia Quick Evaluation

## 2022-11-28 NOTE — Op Note (Signed)
[facility] Patient Name: Clifford Jones Procedure Date: 11/28/2022 MRN: [id number] Attending MD: Corliss Parish , MD, [id number] Date of Birth: Mar 13, 1951 CSN: [id number] Age: 72 Admit Type: Outpatient Procedure:                Colonoscopy Indications:              Screening for colorectal malignant neoplasm, This                            is the patient's first colonoscopy Providers:                Corliss Parish, MD, Margaree Mackintosh, RN,                            Kandice Robinsons, Technician, Melany Guernsey,                            Technician Referring MD:             Vilinda Blanks. Leavy Cella PA, PA Medicines:                Monitored Anesthesia Care Complications:            No immediate complications. Estimated Blood Loss:     Estimated blood loss was minimal. Procedure:                Pre-Anesthesia Assessment:                           - Prior to the procedure, a History and Physical                            was performed, and patient medications and                            allergies were reviewed. The patient's tolerance of                            previous anesthesia was also reviewed. The risks                            and benefits of the procedure and the sedation                            options and risks were discussed with the patient.                            All questions were answered, and informed consent                            was obtained. Prior Anticoagulants: The patient                            last took Coumadin (warfarin) 5 days prior to the  procedure and has taken no anticoagulant or                            antiplatelet agents except for aspirin. ASA Grade                            Assessment: III - A patient with severe systemic                            disease. After reviewing the risks and benefits,                            the patient was deemed in satisfactory condition to                             undergo the procedure.                           After obtaining informed consent, the colonoscope                            was passed under direct vision. Throughout the                            procedure, the patient's blood pressure, pulse, and                            oxygen saturations were monitored continuously. The                            CF-HQ190L ([id number]) Olympus colonoscope was                            introduced through the anus and advanced to the the                            cecum, identified by appendiceal orifice and                            ileocecal valve. The colonoscopy was performed                            without difficulty. The patient tolerated the                            procedure. The quality of the bowel preparation was                            adequate. The ileocecal valve, appendiceal orifice,                            and rectum were photographed. Scope In: 11:11:49 AM Scope Out: 12:22:29 PM Scope Withdrawal Time: 1 hour 6 minutes 34 seconds  Total Procedure Duration: 1 hour 10 minutes  40 seconds  Findings:      The digital rectal exam findings include hemorrhoids. Pertinent       negatives include no palpable rectal lesions.      The colon (entire examined portion) revealed significantly excessive       looping.      28, sessile polyps were found in the recto-sigmoid colon (1), sigmoid       colon (6), descending colon (9), transverse colon (2), hepatic flexure       (5), ascending colon (2) and cecum (3). The polyps were 2 to 14 mm in       size. These polyps were removed with a cold snare. Resection and       retrieval were complete.      A 35 mm polyp was found in the cecum. The polyp was sessile.       Preparations were made for mucosal resection. Demarcation of the lesion       was performed with high-definition white light and narrow band imaging       to clearly identify the boundaries of the lesion.  EverLift was injected       to raise the lesion. Piecemeal mucosal resection using a snare was       performed. Resection and retrieval were complete. Resected tissue       margins were examined and clear of polyp tissue. Fulguration to ablate       the lesion margin by snare tip soft coagulation was successful. To       prevent bleeding after mucosal resection, four hemostatic clips were       successfully placed (MR conditional). Clip manufacturer: [employer]. There was no bleeding at the end of the procedure.      A 20 mm polyp was found in the hepatic flexure. The polyp was       semi-sessile. Preparations were made for mucosal resection. Demarcation       of the lesion was performed with high-definition white light and narrow       band imaging to clearly identify the boundaries of the lesion. EverLift       was injected to raise the lesion. Piecemeal mucosal resection using a       snare was performed. Resection and retrieval were complete. Resected       tissue margins were examined and clear of polyp tissue. Fulguration to       ablate the lesion margin by snare tip soft coagulation was successful.       To prevent bleeding after mucosal resection, one hemostatic clip was       successfully placed (MR conditional). Clip manufacturer: [employer]. There was no bleeding at the end of the procedure.      A 28 mm polyp was found in the descending colon. The polyp was       semi-sessile. Preparations were made for mucosal resection. Demarcation       of the lesion was performed with high-definition white light and narrow       band imaging to clearly identify the boundaries of the lesion. EverLift       was injected to raise the lesion. Piecemeal mucosal resection using a       snare was performed. Resection and retrieval were complete. Resected       tissue margins were examined and clear of polyp tissue. Fulguration to  ablate the lesion margin by snare  tip soft coagulation was successful.       To prevent bleeding after mucosal resection, four hemostatic clips were       successfully placed (MR conditional). Clip manufacturer: [employer]. There was no bleeding at the end of the procedure.      A 40 mm polyp was found in the recto-sigmoid colon. The polyp was       semi-sessile. Area was tattooed with an injection of Spot (carbon black)       on contralateral wall for demarcation purposes.      Multiple medium-mouthed and small-mouthed diverticula were found in the       entire colon.      Non-bleeding non-thrombosed external and internal hemorrhoids were found       during retroflexion, during perianal exam and during digital exam. The       hemorrhoids were Grade II (internal hemorrhoids that prolapse but reduce       spontaneously). Impression:               - Hemorrhoids found on digital rectal exam.                           - There was significant looping of the colon.                           - 28, 2 to 14 mm polyps at the recto-sigmoid colon,                            in the sigmoid colon, in the descending colon, in                            the transverse colon, at the hepatic flexure, in                            the ascending colon and in the cecum, removed with                            a cold snare. Resected and retrieved.                           - One 35 mm polyp in the cecum, removed with                            mucosal resection. Resected and retrieved. Treated                            with a hot snare. Clips (MR conditional) were                            placed. Clip manufacturer: [employer].                           - One 20 mm polyp at the hepatic flexure, removed  with mucosal resection. Resected and retrieved.                            Treated with a hot snare. Clip (MR conditional) was                            placed. Clip manufacturer: [employer].                           - One 28 mm polyp in the descending colon, removed                            with mucosal resection. Resected and retrieved.                            Treated with a hot snare. Clips (MR conditional)                            were placed. Clip manufacturer: [employer].                           - One 40 mm polyp at the recto-sigmoid colon.                            Tattooed contralateral wall. This will need to be                            removed when we have more time.                           - Diverticulosis in the entire examined colon.                           - Non-bleeding non-thrombosed external and internal                            hemorrhoids. Moderate Sedation:      Not Applicable - Patient had care per Anesthesia. Recommendation:           - The patient will be observed post-procedure,                            until all discharge criteria are met.                           - Discharge patient to home.                           - Patient has a contact number available for                            emergencies. The signs and symptoms of potential  delayed complications were discussed with the                            patient. Return to normal activities tomorrow.                            Written discharge instructions were provided to the                            patient.                           - High fiber diet.                           - Use FiberCon 1-2 tablets PO daily.                           - May restart Coumadin on 6/12 to decrease post                            interventional bleeding risk.                           - Okay to continue aspirin.                           - Continue present medications.                           - Repeat colonoscopy in next 3 months for further                            resection of large rectosigmoid colon polyp and                             further clearance of the colon based on the total                            number of polyps that were removed today (31 total                            with still the larger one remaining).                           - The findings and recommendations were discussed                            with the patient.                           - The findings and recommendations were discussed                            with the designated responsible adult. Procedure Code(s):        ---  Professional ---                           [phone], Colonoscopy, flexible; with endoscopic                            mucosal resection                           [phone], 59, Colonoscopy, flexible; with removal of                            tumor(s), polyp(s), or other lesion(s) by snare                            technique                           [id number], 59, Colonoscopy, flexible; with directed                            submucosal injection(s), any substance Diagnosis Code(s):        --- Professional ---                           Z12.11, Encounter for screening for malignant                            neoplasm of colon                           K64.1, Second degree hemorrhoids                           D12.7, Benign neoplasm of rectosigmoid junction                           D12.5, Benign neoplasm of sigmoid colon                           D12.4, Benign neoplasm of descending colon                           D12.3, Benign neoplasm of transverse colon (hepatic                            flexure or splenic flexure)                           D12.2, Benign neoplasm of ascending colon                           D12.0, Benign neoplasm of cecum                           K57.30, Diverticulosis of large intestine without  perforation or abscess without bleeding CPT copyright 2022 American [employer] Association. All rights reserved. The codes documented in this report are preliminary and upon coder  review may  be revised to meet current compliance requirements. Corliss Parish, MD 11/28/2022 12:43:34 PM Number of Addenda: 0

## 2022-11-28 NOTE — Transfer of Care (Signed)
Immediate Anesthesia Transfer of Care Note  Patient: Clifford Jones  Procedure(s) Performed: COLONOSCOPY WITH PROPOFOL POLYPECTOMY ENDOSCOPIC MUCOSAL RESECTION SUBMUCOSAL LIFTING INJECTION HOT HEMOSTASIS (ARGON PLASMA COAGULATION/BICAP) HEMOSTASIS CLIP PLACEMENT SUBMUCOSAL TATTOO INJECTION  Patient Location: PACU  Anesthesia Type:MAC  Level of Consciousness: drowsy  Airway & Oxygen Therapy: Patient Spontanous Breathing and Patient connected to face mask oxygen  Post-op Assessment: Report given to RN, Post -op Vital signs reviewed and stable, and Patient moving all extremities X 4  Post vital signs: Reviewed and stable  Last Vitals:  Vitals Value Taken Time  BP 125/70   Temp    Pulse 74   Resp 12   SpO2 100     Last Pain:  Vitals:   11/28/22 0940  TempSrc: Temporal  PainSc: 0-No pain         Complications: No notable events documented.

## 2022-11-28 NOTE — Interval H&P Note (Signed)
History and Physical Interval Note:  11/28/2022 9:55 AM  Clifford Jones  has presented today for surgery, with the diagnosis of SCREENING COLONSCOPY.  The various methods of treatment have been discussed with the patient and family. After consideration of risks, benefits and other options for treatment, the patient has consented to  Procedure(s): COLONOSCOPY WITH PROPOFOL (N/A) as a surgical intervention.  The patient's history has been reviewed, patient examined, no change in status, stable for surgery.  I have reviewed the patient's chart and labs.  Questions were answered to the patient's satisfaction.     [employer]

## 2022-11-28 NOTE — Anesthesia Postprocedure Evaluation (Signed)
Anesthesia Post Note  Patient: Clifford Jones  Procedure(s) Performed: COLONOSCOPY WITH PROPOFOL POLYPECTOMY ENDOSCOPIC MUCOSAL RESECTION SUBMUCOSAL LIFTING INJECTION HOT HEMOSTASIS (ARGON PLASMA COAGULATION/BICAP) HEMOSTASIS CLIP PLACEMENT SUBMUCOSAL TATTOO INJECTION     Patient location during evaluation: Endoscopy Anesthesia Type: MAC Level of consciousness: awake and alert Pain management: pain level controlled Vital Signs Assessment: post-procedure vital signs reviewed and stable Respiratory status: spontaneous breathing, nonlabored ventilation, respiratory function stable and patient connected to nasal cannula oxygen Cardiovascular status: blood pressure returned to baseline and stable Postop Assessment: no apparent nausea or vomiting Anesthetic complications: no   No notable events documented.  Last Vitals:  Vitals:   11/28/22 1300 11/28/22 1320  BP: (!) 175/94 (!) 179/87  Pulse: 67   Resp: 17   Temp: (!) 35.7 C   SpO2: 95%     Last Pain:  Vitals:   11/28/22 1300  TempSrc: Temporal  PainSc:                  Trevor Iha

## 2022-11-30 ENCOUNTER — Encounter (HOSPITAL_COMMUNITY): Payer: Self-pay | Admitting: Gastroenterology

## 2022-11-30 LAB — SURGICAL PATHOLOGY

## 2022-12-01 ENCOUNTER — Ambulatory Visit: Payer: Medicare HMO | Attending: Internal Medicine | Admitting: *Deleted

## 2022-12-01 ENCOUNTER — Encounter: Payer: Self-pay | Admitting: Gastroenterology

## 2022-12-01 DIAGNOSIS — I513 Intracardiac thrombosis, not elsewhere classified: Secondary | ICD-10-CM | POA: Diagnosis not present

## 2022-12-01 DIAGNOSIS — Z5181 Encounter for therapeutic drug level monitoring: Secondary | ICD-10-CM | POA: Diagnosis not present

## 2022-12-01 LAB — POCT INR: INR: 1 — AB (ref 2.0–3.0)

## 2022-12-01 NOTE — Patient Instructions (Signed)
S/P colonoscopy on 6/10.  Had 31 polyps removed. Restarting warfarin tonight. Take 1 tablet tonight then resume 1 tablet daily except 1/2 tablet on Mondays and Thursdays Recheck INR in 1 wk

## 2022-12-12 ENCOUNTER — Ambulatory Visit: Payer: Medicare HMO | Attending: Cardiology | Admitting: *Deleted

## 2022-12-12 DIAGNOSIS — F17210 Nicotine dependence, cigarettes, uncomplicated: Secondary | ICD-10-CM | POA: Diagnosis not present

## 2022-12-12 DIAGNOSIS — Z5181 Encounter for therapeutic drug level monitoring: Secondary | ICD-10-CM | POA: Diagnosis not present

## 2022-12-12 DIAGNOSIS — J441 Chronic obstructive pulmonary disease with (acute) exacerbation: Secondary | ICD-10-CM | POA: Diagnosis not present

## 2022-12-12 DIAGNOSIS — Z6829 Body mass index (BMI) 29.0-29.9, adult: Secondary | ICD-10-CM | POA: Diagnosis not present

## 2022-12-12 DIAGNOSIS — R030 Elevated blood-pressure reading, without diagnosis of hypertension: Secondary | ICD-10-CM | POA: Diagnosis not present

## 2022-12-12 DIAGNOSIS — I513 Intracardiac thrombosis, not elsewhere classified: Secondary | ICD-10-CM | POA: Diagnosis not present

## 2022-12-12 DIAGNOSIS — J0101 Acute recurrent maxillary sinusitis: Secondary | ICD-10-CM | POA: Diagnosis not present

## 2022-12-12 LAB — POCT INR: INR: 2.9 (ref 2.0–3.0)

## 2022-12-12 NOTE — Patient Instructions (Signed)
Continue warfarin 1 tablet daily except 1/2 tablet on Mondays and Thursdays Recheck INR in 3 wk Starting Keflex x 7 days

## 2022-12-27 ENCOUNTER — Other Ambulatory Visit: Payer: Self-pay

## 2022-12-27 ENCOUNTER — Telehealth: Payer: Self-pay

## 2022-12-27 DIAGNOSIS — D135 Benign neoplasm of extrahepatic bile ducts: Secondary | ICD-10-CM

## 2022-12-27 DIAGNOSIS — Z86010 Personal history of colonic polyps: Secondary | ICD-10-CM

## 2022-12-27 DIAGNOSIS — Z9889 Other specified postprocedural states: Secondary | ICD-10-CM

## 2022-12-27 MED ORDER — NA SULFATE-K SULFATE-MG SULF 17.5-3.13-1.6 GM/177ML PO SOLN: 1.0000 | ORAL | 0 refills | Status: DC

## 2022-12-27 NOTE — Telephone Encounter (Signed)
Patient has been scheduled for 03/13/23 @ [facility] procedure time 10:45am. Patient has been informed. Instructions will be mailed to patient. Patient will contact office with any questions once he receives instructions.   Clearance for coumadin was cleared on 11/10/22 for procedure in June, however I will send request again since it will have been more than 90 days (03/13/23) since patients last procedure.

## 2022-12-27 NOTE — Telephone Encounter (Signed)
-----   Message from [place], [place] sent at 12/01/2022  3:47 PM EDT ----- Regarding: FW: Follow up Colonoscopy  ----- Message ----- From: Lemar Lofty., MD Sent: 11/28/2022   2:59 PM EDT To: Lucky Rathke, CMA Subject: Follow up Colonoscopy                          Amberley Hamler, This patient needs another colonoscopy 90 minute to resect another large polyp that I did not remove today (with the other 30 polyps). Same preparation. Repeat procedure within next 1-3 months. Thanks. GM

## 2022-12-28 ENCOUNTER — Telehealth: Payer: Self-pay

## 2022-12-28 NOTE — Telephone Encounter (Signed)
Request for surgical clearance:     Endoscopy Procedure  What type of surgery is being performed?     Colonoscopy +EMR   When is this surgery scheduled?     03/13/23  What type of clearance is required ?   Pharmacy  Are there any medications that need to be held prior to surgery and how long? Coumadin x 5 days prior to procedure.   Practice name and name of physician performing surgery?      [facility]  What is your office phone and fax number?      Phone- [phone]  Fax- [phone]  Anesthesia type (None, local, MAC, general) ?       MAC

## 2022-12-29 NOTE — Telephone Encounter (Signed)
Patient with diagnosis of LV thrombus, resolved but with high risk of recurrence,  on warfarin for anticoagulation.    Procedure: colonoscopy + EMR Date of procedure: 03/13/23   CrCl 98 Platelet count 216  Per office protocol, patient can hold warfarin for 5 days prior to procedure.   Patient will not need bridging with Lovenox (enoxaparin) around procedure.  **This guidance is not considered finalized until pre-operative APP has relayed final recommendations.**

## 2022-12-29 NOTE — Telephone Encounter (Signed)
   Name: Clifford Jones  DOB: 05-29-1951  MRN: [id number]   Primary Cardiologist: Dina Rich, MD  Chart reviewed as part of pre-operative protocol coverage.   We have been asked for guidance to hold oral anticoagulation for upcoming procedure. Per our clinical pharmacist:  Per office protocol, patient can hold warfarin for 5 days prior to procedure.   Patient will not need bridging with Lovenox (enoxaparin) around procedure.   I will route this recommendation to the requesting party via Epic fax function and remove from pre-op pool. Please call with questions.  Roe Rutherford Katerina Zurn, PA 12/29/2022, 4:30 PM

## 2023-01-02 ENCOUNTER — Ambulatory Visit: Payer: Medicare HMO | Attending: Cardiology | Admitting: *Deleted

## 2023-01-02 DIAGNOSIS — Z5181 Encounter for therapeutic drug level monitoring: Secondary | ICD-10-CM | POA: Diagnosis not present

## 2023-01-02 DIAGNOSIS — I513 Intracardiac thrombosis, not elsewhere classified: Secondary | ICD-10-CM

## 2023-01-02 LAB — POCT INR: INR: 5.1 — AB (ref 2.0–3.0)

## 2023-01-02 NOTE — Patient Instructions (Addendum)
Hold warfarin Tuesdays and Wednesdays then resume 1 tablet daily except 1/2 tablet on Mondays and Thursdays Recheck INR in 2 wk Pt denies S/S of bleeding.  Bleeding and fall precautions discussed and he verbalized understanding.

## 2023-01-05 ENCOUNTER — Other Ambulatory Visit: Payer: Self-pay

## 2023-01-05 MED ORDER — NA SULFATE-K SULFATE-MG SULF 17.5-3.13-1.6 GM/177ML PO SOLN: 1.0000 | ORAL | 0 refills | Status: DC

## 2023-01-05 NOTE — Telephone Encounter (Signed)
Patient has been informed, okay to hold Warfarin x 5 days prior to procedure, per Cardiology. Patient voiced understanding.   Patient asked that Prep for colonoscopy be re-sent to pharmacy. Rx for Suprep sent to [facility].

## 2023-01-12 DIAGNOSIS — H35033 Hypertensive retinopathy, bilateral: Secondary | ICD-10-CM | POA: Diagnosis not present

## 2023-01-12 DIAGNOSIS — H353114 Nonexudative age-related macular degeneration, right eye, advanced atrophic with subfoveal involvement: Secondary | ICD-10-CM | POA: Diagnosis not present

## 2023-01-12 DIAGNOSIS — H43813 Vitreous degeneration, bilateral: Secondary | ICD-10-CM | POA: Diagnosis not present

## 2023-01-16 ENCOUNTER — Ambulatory Visit: Payer: Medicare HMO | Attending: Cardiology | Admitting: *Deleted

## 2023-01-16 DIAGNOSIS — Z5181 Encounter for therapeutic drug level monitoring: Secondary | ICD-10-CM

## 2023-01-16 DIAGNOSIS — I513 Intracardiac thrombosis, not elsewhere classified: Secondary | ICD-10-CM

## 2023-01-16 LAB — POCT INR: INR: 3.7 — AB (ref 2.0–3.0)

## 2023-01-16 NOTE — Patient Instructions (Signed)
Hold warfarin tonight then decrease dose to 1 tablet daily except 1/2 tablet on Mondays, Wednesdays and Fridays Recheck INR in 3 wk

## 2023-01-27 ENCOUNTER — Other Ambulatory Visit: Payer: Self-pay | Admitting: Cardiology

## 2023-02-07 ENCOUNTER — Ambulatory Visit: Payer: Medicare HMO | Attending: Internal Medicine | Admitting: *Deleted

## 2023-02-07 DIAGNOSIS — Z5181 Encounter for therapeutic drug level monitoring: Secondary | ICD-10-CM | POA: Diagnosis not present

## 2023-02-07 DIAGNOSIS — I513 Intracardiac thrombosis, not elsewhere classified: Secondary | ICD-10-CM | POA: Diagnosis not present

## 2023-02-07 LAB — POCT INR: INR: 4.3 — AB (ref 2.0–3.0)

## 2023-02-07 NOTE — Patient Instructions (Signed)
Hold warfarin tonight then decrease dose to 1/2 tablet daily except 1 tablet on Sundays and Thursdays Recheck INR in 2 wk

## 2023-02-23 ENCOUNTER — Ambulatory Visit: Payer: Medicare HMO | Attending: Internal Medicine | Admitting: *Deleted

## 2023-02-23 DIAGNOSIS — Z5181 Encounter for therapeutic drug level monitoring: Secondary | ICD-10-CM | POA: Diagnosis not present

## 2023-02-23 DIAGNOSIS — I513 Intracardiac thrombosis, not elsewhere classified: Secondary | ICD-10-CM | POA: Diagnosis not present

## 2023-02-23 LAB — POCT INR: INR: 1.5 — AB (ref 2.0–3.0)

## 2023-02-23 NOTE — Patient Instructions (Signed)
Take warfarin 2 tablets tonight then increase dose to 1 tablet daily except 1/2 tablet on Mondays, Wednesdays and Fridays Recheck INR in 2 wk Pending colonoscopy on 9/23.  Will hold warfarin 5 days before procedure and resume after procedure.

## 2023-02-24 ENCOUNTER — Encounter (HOSPITAL_COMMUNITY): Payer: Self-pay | Admitting: Emergency Medicine

## 2023-02-24 ENCOUNTER — Emergency Department (HOSPITAL_COMMUNITY): Payer: Medicare HMO

## 2023-02-24 ENCOUNTER — Other Ambulatory Visit: Payer: Self-pay

## 2023-02-24 ENCOUNTER — Emergency Department (HOSPITAL_COMMUNITY)
Admission: EM | Admit: 2023-02-24 | Discharge: 2023-02-24 | Disposition: A | Discharge status: Home / Self Care | Payer: Medicare HMO | Attending: Emergency Medicine | Admitting: Emergency Medicine

## 2023-02-24 DIAGNOSIS — F17200 Nicotine dependence, unspecified, uncomplicated: Secondary | ICD-10-CM | POA: Insufficient documentation

## 2023-02-24 DIAGNOSIS — Z7982 Long term (current) use of aspirin: Secondary | ICD-10-CM | POA: Insufficient documentation

## 2023-02-24 DIAGNOSIS — J9801 Acute bronchospasm: Secondary | ICD-10-CM | POA: Diagnosis not present

## 2023-02-24 DIAGNOSIS — I2510 Atherosclerotic heart disease of native coronary artery without angina pectoris: Secondary | ICD-10-CM | POA: Insufficient documentation

## 2023-02-24 DIAGNOSIS — E119 Type 2 diabetes mellitus without complications: Secondary | ICD-10-CM | POA: Insufficient documentation

## 2023-02-24 DIAGNOSIS — E871 Hypo-osmolality and hyponatremia: Secondary | ICD-10-CM | POA: Insufficient documentation

## 2023-02-24 DIAGNOSIS — R0602 Shortness of breath: Secondary | ICD-10-CM | POA: Diagnosis not present

## 2023-02-24 DIAGNOSIS — U071 COVID-19: Secondary | ICD-10-CM | POA: Diagnosis not present

## 2023-02-24 DIAGNOSIS — R791 Abnormal coagulation profile: Secondary | ICD-10-CM

## 2023-02-24 DIAGNOSIS — Z79899 Other long term (current) drug therapy: Secondary | ICD-10-CM | POA: Diagnosis not present

## 2023-02-24 DIAGNOSIS — Z7901 Long term (current) use of anticoagulants: Secondary | ICD-10-CM | POA: Diagnosis not present

## 2023-02-24 DIAGNOSIS — I10 Essential (primary) hypertension: Secondary | ICD-10-CM | POA: Insufficient documentation

## 2023-02-24 DIAGNOSIS — J449 Chronic obstructive pulmonary disease, unspecified: Secondary | ICD-10-CM | POA: Insufficient documentation

## 2023-02-24 LAB — BASIC METABOLIC PANEL
Anion gap: 9 (ref 5–15)
BUN: 15 mg/dL (ref 8–23)
CO2: 28 mmol/L (ref 22–32)
Calcium: 8.5 mg/dL — ABNORMAL LOW (ref 8.9–10.3)
Chloride: 92 mmol/L — ABNORMAL LOW (ref 98–111)
Creatinine, Ser: 0.82 mg/dL (ref 0.61–1.24)
GFR, Estimated: >60 mL/min (ref >60)
Glucose, Bld: 341 mg/dL — ABNORMAL HIGH (ref 70–99)
Potassium: 4.3 mmol/L (ref 3.5–5.1)
Sodium: 129 mmol/L — ABNORMAL LOW (ref 135–145)

## 2023-02-24 LAB — BRAIN NATRIURETIC PEPTIDE: B Natriuretic Peptide: 200 pg/mL — ABNORMAL HIGH (ref 0.0–100.0)

## 2023-02-24 LAB — CBC WITH DIFFERENTIAL/PLATELET
Abs Immature Granulocytes: 0.02 10*3/uL (ref 0.00–0.07)
Basophils Absolute: 0 10*3/uL (ref 0.0–0.1)
Basophils Relative: 0 %
Eosinophils Absolute: 0.2 10*3/uL (ref 0.0–0.5)
Eosinophils Relative: 3 %
HCT: 38 % — ABNORMAL LOW (ref 39.0–52.0)
Hemoglobin: 13.1 g/dL (ref 13.0–17.0)
Immature Granulocytes: 0 %
Lymphocytes Relative: 33 %
Lymphs Abs: 2.9 10*3/uL (ref 0.7–4.0)
MCH: 31 pg (ref 26.0–34.0)
MCHC: 34.5 g/dL (ref 30.0–36.0)
MCV: 90 fL (ref 80.0–100.0)
Monocytes Absolute: 0.7 10*3/uL (ref 0.1–1.0)
Monocytes Relative: 8 %
Neutro Abs: 5.1 10*3/uL (ref 1.7–7.7)
Neutrophils Relative %: 56 %
Platelets: 264 10*3/uL (ref 150–400)
RBC: 4.22 MIL/uL (ref 4.22–5.81)
RDW: 12.5 % (ref 11.5–15.5)
WBC: 9 10*3/uL (ref 4.0–10.5)
nRBC: 0 % (ref 0.0–0.2)

## 2023-02-24 LAB — RESP PANEL BY RT-PCR (RSV, FLU A&B, COVID)  RVPGX2
Influenza A by PCR: NEGATIVE
Influenza B by PCR: NEGATIVE
Resp Syncytial Virus by PCR: NEGATIVE
SARS Coronavirus 2 by RT PCR: POSITIVE — AB

## 2023-02-24 LAB — TROPONIN I (HIGH SENSITIVITY): Troponin I (High Sensitivity): 11 ng/L (ref <18)

## 2023-02-24 LAB — PROTIME-INR
INR: 1.5 — ABNORMAL HIGH (ref 0.8–1.2)
Prothrombin Time: 18 s — ABNORMAL HIGH (ref 11.4–15.2)

## 2023-02-24 MED ORDER — PAXLOVID (300/100) 20 X 150 MG & 10 X 100MG PO TBPK: 3.0000 | ORAL_TABLET | Freq: Two times a day (BID) | ORAL | 0 refills | Status: AC

## 2023-02-24 MED ORDER — IPRATROPIUM-ALBUTEROL 0.5-2.5 (3) MG/3ML IN SOLN
3.0000 mL | Freq: Once | RESPIRATORY_TRACT | Status: AC
  Administered 2023-02-24: 3 mL via RESPIRATORY_TRACT
  Filled 2023-02-24: qty 3

## 2023-02-24 MED ORDER — ALBUTEROL SULFATE HFA 108 (90 BASE) MCG/ACT IN AERS: 2.0000 | INHALATION_SPRAY | RESPIRATORY_TRACT | Status: DC | PRN

## 2023-02-24 MED ORDER — PREDNISONE 20 MG PO TABS: ORAL_TABLET | ORAL | 0 refills | Status: DC

## 2023-02-24 MED ORDER — PREDNISONE 20 MG PO TABS
40.0000 mg | ORAL_TABLET | Freq: Once | ORAL | Status: AC
  Administered 2023-02-24: 40 mg via ORAL
  Filled 2023-02-24: qty 2

## 2023-02-24 NOTE — ED Provider Notes (Signed)
[place] [facility]GENC[facility] DEPARTMENT AT [facility] Provider Note   CSN: [id number] Arrival date & time: 02/24/23  6213    History  Chief Complaint  Patient presents with   Shortness of Breath    JAQUAYLON WHITLOCK is a 72 y.o. male.  The history is provided by the patient.  Shortness of Breath He has history of hypertension, diabetes, hyperlipidemia, coronary artery disease, COPD, cardiac mural thrombus anticoagulated on warfarin and comes in with 2-day history of nonproductive cough and increasing shortness of breath.  He denies chest pain, heaviness, tightness, pressure.  He denies fever or chills but has had some sweats.  He denies nausea or vomiting.  He does smoke occasionally.  He denies any sick contacts.   Home Medications Prior to Admission medications   Medication Sig Start Date End Date Taking? Authorizing Provider  acetaminophen (TYLENOL) 500 MG tablet Take 1,000 mg by mouth every 6 (six) hours as needed for mild pain, moderate pain or headache.     [provider]  albuterol (PROVENTIL) (2.5 MG/3ML) 0.083% nebulizer solution Take 2.5 mg by nebulization every 4 (four) hours as needed for wheezing or shortness of breath. 10/12/20   [provider]  amLODipine (NORVASC) 5 MG tablet TAKE 1 TABLET BY MOUTH DAILY 01/27/23   Antoine Poche, MD  aspirin 81 MG EC tablet Take 1 tablet (81 mg total) by mouth daily. 07/09/18   Malissa Hippo, MD  carvedilol (COREG) 3.125 MG tablet TAKE 1 TABLET BY MOUTH TWICE DAILY 01/27/23   Antoine Poche, MD  cetirizine (ZYRTEC) 10 MG tablet Take 10 mg by mouth daily as needed for allergies.    [provider]  cyanocobalamin (VITAMIN B12) 1000 MCG tablet Take 1,000 mcg by mouth daily.    [provider]  furosemide (LASIX) 20 MG tablet Take 20 mg by mouth daily as needed for edema.    [provider]  isosorbide mononitrate (IMDUR) 30 MG 24 hr tablet Take 1 tablet (30 mg total) by mouth  daily. Patient not taking: Reported on 11/23/2022 12/18/19   Azalee Course, PA  lisinopril (ZESTRIL) 2.5 MG tablet Take 1 tablet (2.5 mg total) by mouth daily. 12/18/19   Azalee Course, PA  magnesium hydroxide (MILK OF MAGNESIA) 400 MG/5ML suspension Take 15 mLs by mouth daily as needed for mild constipation. 01/24/21   Vassie Loll, MD  Multiple Vitamins-Minerals (PRESERVISION AREDS 2) CAPS Take 1 capsule by mouth 2 (two) times daily.    [provider]  Na Sulfate-K Sulfate-Mg Sulf (SUPREP BOWEL PREP KIT) 17.5-3.13-1.6 GM/177ML SOLN Take 1 kit by mouth as directed. For colonoscopy prep 01/05/23   Mansouraty, Netty Starring., MD  nitroGLYCERIN (NITROSTAT) 0.4 MG SL tablet Place 1 tablet (0.4 mg total) under the tongue every 5 (five) minutes x 3 doses as needed for chest pain. 03/03/15   Leone Brand, NP  OXYGEN Inhale 2 L into the lungs at bedtime as needed (at night as needed).    [provider]  pantoprazole (PROTONIX) 40 MG tablet Take 40 mg by mouth 2 (two) times daily.    [provider]  Polyethylene Glycol 400 (BLINK TEARS OP) Place 1 drop into both eyes daily as needed (dry eyes).    [provider]  RANEXA 1000 MG SR tablet TAKE ONE TABLET BY MOUTH TWICE DAILY 03/17/17   Laqueta Linden, MD  rosuvastatin (CRESTOR) 40 MG tablet Take 1 tablet (40 mg total) by mouth at bedtime. 12/17/19  Azalee Course, [place]  warfarin (COUMADIN) 2 MG tablet TAKE 1 TABLET BY MOUTH DAILY EXCEPT 1/2 TABLET ON MONDAYS AND THURSDAYS OR AS DIRECTED 11/30/22   Mansouraty, Netty Starring., MD      Allergies    Patient has no known allergies.    Review of Systems   Review of Systems  Respiratory:  Positive for shortness of breath.   All other systems reviewed and are negative.   Physical Exam Updated Vital Signs BP (!) 199/96   Pulse 75   Temp 98.2 F (36.8 C) (Oral)   Resp (!) 22   Ht 6' (1.829 m)   Wt 103.4 kg   SpO2 97%   BMI 30.92 kg/m  Physical Exam Vitals and nursing note  reviewed.   72 year old male, resting comfortably and in no acute distress. Vital signs are significant for elevated respiratory rate and elevated blood pressure. Oxygen saturation is 97%, which is normal. Head is normocephalic and atraumatic. PERRLA, EOMI. Oropharynx is clear. Neck is nontender and supple without adenopathy or JVD. Back is nontender and there is no CVA tenderness.  There is 1+ presacral edema. Lungs have diminished airflow diffusely.  There is a prolonged exhalation phase.  Faint expiratory wheezing is noted with forced exhalation.  There are no rales or rhonchi. Chest is nontender. Heart has regular rate and rhythm without murmur. Abdomen is soft, flat, nontender. Extremities have 1+ edema with moderate venous stasis changes present. Skin is warm and dry without rash. Neurologic: Mental status is normal, cranial nerves are intact, moves all extremities equally.  ED Results / Procedures / Treatments   Labs (all labs ordered are listed, but only abnormal results are displayed) Labs Reviewed  RESP PANEL BY RT-PCR (RSV, FLU A&B, COVID)  RVPGX2 - Abnormal; Notable for the following components:      Result Value   SARS Coronavirus 2 by RT PCR POSITIVE (*)    All other components within normal limits  BRAIN NATRIURETIC PEPTIDE - Abnormal; Notable for the following components:   B Natriuretic Peptide 200.0 (*)    All other components within normal limits  BASIC METABOLIC PANEL - Abnormal; Notable for the following components:   Sodium 129 (*)    Chloride 92 (*)    Glucose, Bld 341 (*)    Calcium 8.5 (*)    All other components within normal limits  PROTIME-INR - Abnormal; Notable for the following components:   Prothrombin Time 18.0 (*)    INR 1.5 (*)    All other components within normal limits  CBC WITH DIFFERENTIAL/PLATELET - Abnormal; Notable for the following components:   HCT 38.0 (*)    All other components within normal limits  CBC WITH DIFFERENTIAL/PLATELET   TROPONIN I (HIGH SENSITIVITY)    EKG ED ECG REPORT   Date: 02/24/2023  Rate: 67  Rhythm: normal sinus rhythm  QRS Axis: normal  Intervals: normal  ST/T Wave abnormalities: nonspecific ST changes  Conduction Disutrbances:none  Narrative Interpretation: Sinus rhythm with nonspecific ST changes.  When compared with ECG of 02/14/2022, no significant changes are seen.  Old EKG Reviewed: unchanged  I have personally reviewed the EKG tracing and agree with the computerized printout as noted.  Radiology DG Chest 2 View  Result Date: 02/24/2023 CLINICAL DATA:  Shortness of breath EXAM: CHEST - 2 VIEW COMPARISON:  01/20/2021 FINDINGS: Normal heart size and mediastinal contours. Prior CABG. Hyperinflation. No acute infiltrate or edema. No effusion or pneumothorax. No acute osseous findings. IMPRESSION: No  evidence of acute disease. Electronically Signed   By: Tiburcio Pea M.D.   On: 02/24/2023 05:14    Procedures Procedures  Cardiac monitor shows normal sinus rhythm, per my interpretation.  Medications Ordered in ED Medications  predniSONE (DELTASONE) tablet 40 mg (has no administration in time range)  ipratropium-albuterol (DUONEB) 0.5-2.5 (3) MG/3ML nebulizer solution 3 mL (3 mLs Nebulization Given 02/24/23 0404)    ED Course/ Medical Decision Making/ A&P                                 Medical Decision Making Amount and/or Complexity of Data Reviewed Labs: ordered. Radiology: ordered.  Risk Prescription drug management.   Cough with shortness of breath.  This likely is a respiratory infection such as bronchitis or pneumonia.  He does have some findings of heart failure and there could be a component of heart failure.  Possible angina equivalent.  Doubt pulmonary embolism.  I have ordered respiratory pathogen panel to evaluate for possible influenza or COVID-19.  I have ordered a chest x-ray to look for evidence of pneumonia.  I have ordered laboratory workup of CBC, basic  metabolic panel, troponin x 1, BNP, INR.  I have reviewed and interpreted his electrocardiogram and my interpretation is nonspecific ST changes unchanged from prior.  I have ordered a therapeutic trial of albuterol with ipratropium via nebulizer.  I have reviewed his laboratory test and my interpretation is normal CBC, elevated BNP but at his baseline, mild hyponatremia which is not felt to be clinically significant, mild to moderate hyperglycemia consistent with known history of diabetes, INR subtherapeutic-will need to discuss with Coumadin clinic how to adjust his medication dose, normal troponin.  Respiratory pathogen panel is positive for SARS Coronavirus 2, indicating he has a COVID infection which apparently is the cause of his symptoms.  Chest x-ray shows no acute disease.  I have independently reviewed the images, and agree with the radiologist's interpretation.  Following nebulizer treatment, he feels significantly better.  I have ordered a dose of prednisone and I am discharging him with a prescription for prednisone as well as a course of Paxlovid.  Return precautions are discussed.  Final Clinical Impression(s) / ED Diagnoses Final diagnoses:  COVID-19 virus infection  Bronchospasm  Hyponatremia  Subtherapeutic international normalized ratio (INR)    Rx / DC Orders ED Discharge Orders          Ordered    predniSONE (DELTASONE) 20 MG tablet        02/24/23 0550    nirmatrelvir & ritonavir (PAXLOVID, 300/100,) 20 x 150 MG & 10 x 100MG  TBPK  2 times daily        02/24/23 0550              Dione Booze, MD 02/24/23 [phone]

## 2023-02-24 NOTE — ED Triage Notes (Signed)
Pt to ed pov. C/o of increased sob, dry cough, chills, body aches. Pt believes they have Covid as they feel the same as when they had covid. Pt wears 2.5L [place] O2 at home PRN.

## 2023-02-24 NOTE — Discharge Instructions (Addendum)
Continue using your nebulizer every 4 hours as needed.  This will treat both coughing and shortness of breath.  Return to the emergency department if your breathing is getting worse.  Your INR was lower than it should be.  It is 1.5, and should be between 2.0 and 3.0.  Please contact the Coumadin clinic for instructions on how to adjust your warfarin dose.

## 2023-03-06 ENCOUNTER — Encounter (HOSPITAL_COMMUNITY): Payer: Self-pay | Admitting: Gastroenterology

## 2023-03-06 ENCOUNTER — Other Ambulatory Visit: Payer: Self-pay

## 2023-03-06 ENCOUNTER — Telehealth: Payer: Self-pay

## 2023-03-06 ENCOUNTER — Ambulatory Visit: Payer: Medicare HMO | Attending: Cardiology | Admitting: *Deleted

## 2023-03-06 DIAGNOSIS — Z5181 Encounter for therapeutic drug level monitoring: Secondary | ICD-10-CM | POA: Diagnosis not present

## 2023-03-06 DIAGNOSIS — I513 Intracardiac thrombosis, not elsewhere classified: Secondary | ICD-10-CM | POA: Diagnosis not present

## 2023-03-06 LAB — POCT INR: POC INR: 1.4

## 2023-03-06 NOTE — Progress Notes (Addendum)
COVID Vaccine Completed:  Date of COVID positive in last 90 days: 02-24-23.  Patient states that he has been symptom free since 03-03-23  PCP - Daria Pastures, PA Cardiologist - Dina Rich, MD  Chest x-ray - 02-24-23 Epic EKG - 02-24-23 Epic Stress Test - 03-02-15 Epic ECHO - 08-11-20 Epic Cardiac Cath - 12-16-22 Epic Pacemaker/ICD device last checked: Spinal Cord Stimulator:  Bowel Prep -   Sleep Study -  CPAP -   Fasting Blood Sugar -  Checks Blood Sugar - does not check   Last dose of GLP1 agonist-  N/A GLP1 instructions:  N/A   Last dose of SGLT-2 inhibitors-  N/A SGLT-2 instructions: N/A   Blood Thinner Instructions:  Coumadin.  Per patient to take last dose on 03-07-23 Aspirin Instructions:  ASA 81.  Patient states that he will stop this on 03-07-23 Last Dose:  Activity level:  Can go up a flight of stairs and perform activities of daily living without stopping and without symptoms of chest pain or shortness of breath.  Able to exercise without symptoms  Unable to go up a flight of stairs without symptoms of     Anesthesia review:  CAD, hx of MI, HTN, DM.  Patient uses oxygen at 2L prn, says that he uses mostly during the night, occasional daytime use.   Patient denies shortness of breath, fever, cough and chest pain at PAT appointment  Patient verbalized understanding of instructions that were given to them at the PAT appointment. Patient was also instructed that they will need to review over the PAT instructions again at home before surgery.

## 2023-03-06 NOTE — Patient Instructions (Signed)
Description   Take 1 tablet of warfarin today and then START taking warfarin 1 tablet daily except for 1/2 a tablet on Mondays and Fridays. Start holding warfarin 03/08/2023 resume warfarin the evening of the procedure 03/13/2023 unless the physician states otherwise. (Take an extra half tablet with usual dose for 1 day then resume normal dose).  Recheck INR 1 wk post procedure.

## 2023-03-06 NOTE — Progress Notes (Signed)
Spoke to Towner at [facility] and advised her that patient recently tested positive for Covid and needs to be symptom free for 14 days.  Patient stated that symptoms resolved on 03-03-23.  Per Christeen Douglas, PA-C case will need to be pushed back.

## 2023-03-06 NOTE — Telephone Encounter (Signed)
Received call from [facility] preadmission that pt has covid and will have to be symptom free for 14 days before having procedure. His procedure appt will need to be pushed out.

## 2023-03-07 ENCOUNTER — Ambulatory Visit: Payer: Medicare HMO | Attending: Cardiology | Admitting: Cardiology

## 2023-03-07 ENCOUNTER — Encounter: Payer: Self-pay | Admitting: Cardiology

## 2023-03-07 VITALS — BP 156/76 | HR 70 | Ht 72.0 in | Wt 206.0 lb

## 2023-03-07 DIAGNOSIS — I10 Essential (primary) hypertension: Secondary | ICD-10-CM

## 2023-03-07 DIAGNOSIS — I513 Intracardiac thrombosis, not elsewhere classified: Secondary | ICD-10-CM | POA: Diagnosis not present

## 2023-03-07 DIAGNOSIS — I2510 Atherosclerotic heart disease of native coronary artery without angina pectoris: Secondary | ICD-10-CM | POA: Diagnosis not present

## 2023-03-07 DIAGNOSIS — I2583 Coronary atherosclerosis due to lipid rich plaque: Secondary | ICD-10-CM | POA: Diagnosis not present

## 2023-03-07 DIAGNOSIS — E782 Mixed hyperlipidemia: Secondary | ICD-10-CM

## 2023-03-07 DIAGNOSIS — R600 Localized edema: Secondary | ICD-10-CM | POA: Diagnosis not present

## 2023-03-07 NOTE — Progress Notes (Signed)
Clinical Summary Clifford Jones is a 72 y.o.male seen today for follow up of the following medical problems.     1. CAD - history of CABG in 2003 -  h/o non-STEMI and PCI with DES to the native RPLA via the RIMA graft. He has residual VG->OM, native OM, distal LAD, and diagonal disease      -NSTEMI 11/2019 - Cardiac catheterization performed on 12/16/2019 showed 80% distal left main with heavy calcification involving the ostium of the left circumflex up to 90%, chronic total occlusion of mid and distal LAD, 80% D1 stenosis, chronic total occlusion of ramus intermedius, distal left circumflex and proximal RCA, widely patent LIMA to LAD and patent RIMA to distal RCA, chronically occluded sequential SVG to D1 and D2, chronically occluded SVG to OM.  SVG to ramus was unable to be engaged however likely occluded RCA was not seen on the ascending aortogram.  Postprocedure, medical therapy was recommended If patient has any refractory angiogram, only interventional target would be distal left main although this will be very challenging given the heavy calcification and severe stenosis in the ostial left circumflex artery.  PCI of D1 could also be considered at the time   07/2020 echo LVEF 50-55%, sluggish apical flow without thrombus   -beta blocker dosing limited due to bradycardia   - no chest pains, no SOB/DOE - compliant with meds   2. LE edema -- no recent edema, has prn lasix but does not does not need regularly   3. HTN -- he reports a component of white coat HTN - weight down 35 lbs since 01/2022 - home bp's 120s-130s/60s-70s   4. Hyperlipidemia - compliant with meds - 08/2021 TC 149 TG 357 HDL 47 LDL 49 - upcoming labs with pcp    5. Ampullary adenoma - noted by prior EGD, s/p ERCP and ampullectomy.        6.LV thrombus Echocardiogram performed on 12/15/2019 showed a small apical LV thrombus measuring 6 x 6 mm, EF 50 to 55%, grade 2 DD, RVSP 28.6 mmHg.     07/2020 ehco: LVEF  50-55%, no thrombus though sluggish apical flow - denies any bleeding on coumadin.       7. AAA repair - around 2006  8. COVID + 02/24/23 - ER visit with SOB Past Medical History:  Diagnosis Date   Ampullary adenoma    Arthritis    Reported by patient   Cancer [facility])    Reported by patient   Claustrophobia    Closed fracture of right distal tibia    COPD (chronic obstructive pulmonary disease) (HCC)    Reported by patient   Coronary artery disease    a. 1999 Inf MI;  b. 2003 CABGx6;  c. 2008 NSTEMI, occluded SVG to marginal and diagonal system. 2 DES placed to marginal system;  c. NSTEMI - 02/2012 Xience Xpedition DES to oS-OM and POBA to mid lesion;  c. 10/2013 NSTEMI/PCI: VG->OM 80 ISR, native OM 80/90 small, LIMA->LAD ok w/ 90 dLAD, D1 80, RIMA->RPL ok w/ 95 in RPL (2.5x14 Resolute DES), nl EF.   Dyspnea    Reported by patient   GERD (gastroesophageal reflux disease)    Hyperlipidemia    Hypertension    Myocardial infarction (HCC)    X 6   Pneumonia    S/P AAA repair    Tobacco abuse    Type 2 diabetes mellitus without complication, without long-term current use of insulin (HCC)    Wears dentures  Wears glasses      No Known Allergies   Current Outpatient Medications  Medication Sig Dispense Refill   acetaminophen (TYLENOL) 500 MG tablet Take 1,000 mg by mouth every 6 (six) hours as needed for mild pain, moderate pain or headache.      albuterol (PROVENTIL) (2.5 MG/3ML) 0.083% nebulizer solution Take 2.5 mg by nebulization every 4 (four) hours as needed for wheezing or shortness of breath.     amLODipine (NORVASC) 5 MG tablet TAKE 1 TABLET BY MOUTH DAILY 30 tablet 6   aspirin 81 MG EC tablet Take 1 tablet (81 mg total) by mouth daily. 30 tablet 11   carvedilol (COREG) 3.125 MG tablet TAKE 1 TABLET BY MOUTH TWICE DAILY 60 tablet 6   cetirizine (ZYRTEC) 10 MG tablet Take 10 mg by mouth daily as needed for allergies.     cyanocobalamin (VITAMIN B12) 1000 MCG tablet  Take 1,000 mcg by mouth daily.     furosemide (LASIX) 20 MG tablet Take 20 mg by mouth daily as needed for edema.     isosorbide mononitrate (IMDUR) 30 MG 24 hr tablet Take 1 tablet (30 mg total) by mouth daily. (Patient not taking: Reported on 11/23/2022) 90 tablet 1   lisinopril (ZESTRIL) 2.5 MG tablet Take 1 tablet (2.5 mg total) by mouth daily. 60 tablet 1   magnesium hydroxide (MILK OF MAGNESIA) 400 MG/5ML suspension Take 15 mLs by mouth daily as needed for mild constipation. 355 mL 0   Multiple Vitamins-Minerals (PRESERVISION AREDS 2) CAPS Take 1 capsule by mouth 2 (two) times daily.     Na Sulfate-K Sulfate-Mg Sulf (SUPREP BOWEL PREP KIT) 17.5-3.13-1.6 GM/177ML SOLN Take 1 kit by mouth as directed. For colonoscopy prep 354 mL 0   nitroGLYCERIN (NITROSTAT) 0.4 MG SL tablet Place 1 tablet (0.4 mg total) under the tongue every 5 (five) minutes x 3 doses as needed for chest pain. 25 tablet 4   OXYGEN Inhale 2 L into the lungs at bedtime as needed (at night as needed).     pantoprazole (PROTONIX) 40 MG tablet Take 40 mg by mouth 2 (two) times daily.     Polyethylene Glycol 400 (BLINK TEARS OP) Place 1 drop into both eyes daily as needed (dry eyes).     predniSONE (DELTASONE) 20 MG tablet 2 tabs po daily x 4 days 8 tablet 0   RANEXA 1000 MG SR tablet TAKE ONE TABLET BY MOUTH TWICE DAILY 30 tablet 0   rosuvastatin (CRESTOR) 40 MG tablet Take 1 tablet (40 mg total) by mouth at bedtime. 90 tablet 1   warfarin (COUMADIN) 2 MG tablet TAKE 1 TABLET BY MOUTH DAILY EXCEPT 1/2 TABLET ON MONDAYS AND THURSDAYS OR AS DIRECTED 30 tablet 5   No current facility-administered medications for this visit.     Past Surgical History:  Procedure Laterality Date   ABDOMINAL AORTIC ANEURYSM REPAIR  2006   APPENDECTOMY     BIOPSY  03/08/2021   Procedure: BIOPSY;  Surgeon: Lemar Lofty., MD;  Location: [facility];  Service: Gastroenterology;;   CATARACT EXTRACTION W/PHACO Right 11/05/2021   Procedure:  CATARACT EXTRACTION PHACO AND INTRAOCULAR LENS PLACEMENT (IOC);  Surgeon: Fabio Pierce, MD;  Location: [facility];  Service: Ophthalmology;  Laterality: Right;  CDE: 11.33   COLONOSCOPY WITH PROPOFOL N/A 11/28/2022   Procedure: COLONOSCOPY WITH PROPOFOL;  Surgeon: Meridee Score Netty Starring., MD;  Location: [facility];  Service: Gastroenterology;  Laterality: N/A;   CORONARY ANGIOPLASTY WITH STENT PLACEMENT  CORONARY ARTERY BYPASS GRAFT  2003   ENDOSCOPIC MUCOSAL RESECTION  11/28/2022   Procedure: ENDOSCOPIC MUCOSAL RESECTION;  Surgeon: Meridee Score Netty Starring., MD;  Location: [facility];  Service: Gastroenterology;;   ENDOSCOPIC RETROGRADE CHOLANGIOPANCREATOGRAPHY (ERCP) WITH PROPOFOL N/A 03/25/2019   Procedure: ENDOSCOPIC RETROGRADE CHOLANGIOPANCREATOGRAPHY (ERCP) WITH PROPOFOL;  Surgeon: Lemar Lofty., MD;  Location: [facility] ENDOSCOPY;  Service: Gastroenterology;  Laterality: N/A;   ENDOSCOPIC RETROGRADE CHOLANGIOPANCREATOGRAPHY (ERCP) WITH PROPOFOL N/A 03/08/2021   Procedure: ENDOSCOPIC RETROGRADE CHOLANGIOPANCREATOGRAPHY (ERCP) WITH PROPOFOL;  Surgeon: Meridee Score Netty Starring., MD;  Location: [facility];  Service: Gastroenterology;  Laterality: N/A;   ENDOSCOPIC RETROGRADE CHOLANGIOPANCREATOGRAPHY (ERCP) WITH PROPOFOL N/A 04/14/2022   Procedure: ENDOSCOPIC RETROGRADE CHOLANGIOPANCREATOGRAPHY (ERCP) WITH PROPOFOL;  Surgeon: Meridee Score Netty Starring., MD;  Location: [facility];  Service: Gastroenterology;  Laterality: N/A;   ESOPHAGEAL DILATION  07/06/2018   Procedure: ESOPHAGEAL DILATION;  Surgeon: Malissa Hippo, MD;  Location: [facility];  Service: Endoscopy;;   ESOPHAGOGASTRODUODENOSCOPY N/A 07/06/2018   Procedure: ESOPHAGOGASTRODUODENOSCOPY (EGD);  Surgeon: Malissa Hippo, MD;  [facility];  Service: Endoscopy;  Laterality: N/A;   ESOPHAGOGASTRODUODENOSCOPY N/A 08/16/2018   Procedure: ESOPHAGOGASTRODUODENOSCOPY (EGD);  Surgeon: Rachael Fee, MD;  Location: [facility];  Service: Endoscopy;  Laterality: N/A;   ESOPHAGOGASTRODUODENOSCOPY (EGD) WITH PROPOFOL N/A 03/25/2019   Procedure: ESOPHAGOGASTRODUODENOSCOPY (EGD) WITH PROPOFOL;  Surgeon: Meridee Score Netty Starring., MD;  Location[facility] ENDOSCOPY;  Service: Gastroenterology;  Laterality: N/A;   EUS N/A 08/16/2018   Procedure: UPPER ENDOSCOPIC ULTRASOUND (EUS) RADIAL;  Surgeon: Rachael Fee, MD;  Location: [facility];  Service: Endoscopy;  Laterality: N/A;   EUS N/A 03/25/2019   Procedure: UPPER ENDOSCOPIC ULTRASOUND (EUS) RADIAL;  Surgeon: Lemar Lofty., MD;  Location: [facility] ENDOSCOPY;  Service: Gastroenterology;  Laterality: N/A;   FOREIGN BODY REMOVAL  07/06/2018   Procedure: FOREIGN BODY REMOVAL;  Surgeon: Malissa Hippo, MD;  Location: [facility];  Service: Endoscopy;;   HEMOSTASIS CLIP PLACEMENT  11/28/2022   Procedure: HEMOSTASIS CLIP PLACEMENT;  Surgeon: Lemar Lofty., MD;  Location: [facility];  Service: Gastroenterology;;   HERNIA REPAIR     umbicical   LEFT HEART CATHETERIZATION WITH CORONARY ANGIOGRAM N/A 03/05/2012   Procedure: LEFT HEART CATHETERIZATION WITH CORONARY ANGIOGRAM;  Surgeon: Herby Abraham, MD;  Location: [facility] CATH LAB;  Service: Cardiovascular;  Laterality: N/A;   LEFT HEART CATHETERIZATION WITH CORONARY ANGIOGRAM N/A 10/23/2013   Procedure: LEFT HEART CATHETERIZATION WITH CORONARY ANGIOGRAM;  Surgeon: Micheline Chapman, MD;  Location: [facility] CATH LAB;  Service: Cardiovascular;  Laterality: N/A;   LEFT HEART CATHETERIZATION WITH CORONARY/GRAFT ANGIOGRAM  10/22/2013   Procedure: LEFT HEART CATHETERIZATION WITH Isabel Caprice;  Surgeon: Marykay Lex, MD;  Location: [facility] CATH LAB;  Service: Cardiovascular;;   PANCREATIC STENT PLACEMENT  03/25/2019   Procedure: PANCREATIC STENT PLACEMENT;  Surgeon: Meridee Score Netty Starring., MD;  Location: [facility] ENDOSCOPY;  Service: Gastroenterology;;   PANCREATIC STENT PLACEMENT  03/08/2021   Procedure: PANCREATIC STENT  PLACEMENT;  Surgeon: Lemar Lofty., MD;  Location: [facility];  Service: Gastroenterology;;   PANCREATIC STENT PLACEMENT  04/14/2022   Procedure: PANCREATIC STENT PLACEMENT;  Surgeon: Lemar Lofty., MD;  Location: [facility];  Service: Gastroenterology;;   PERCUTANEOUS CORONARY STENT INTERVENTION (PCI-S) N/A 03/07/2012   Procedure: PERCUTANEOUS CORONARY STENT INTERVENTION (PCI-S);  Surgeon: Tonny Bollman, MD;  Location: [facility] CATH LAB;  Service: Cardiovascular;  Laterality: N/A;   PERCUTANEOUS CORONARY STENT INTERVENTION (PCI-S) Right 10/23/2013   Procedure: PERCUTANEOUS CORONARY STENT INTERVENTION (  PCI-S);  Surgeon: Micheline Chapman, MD;  Location: [facility] CATH LAB;  Service: Cardiovascular;  Laterality: Right;   POLYPECTOMY  07/06/2018   Procedure: POLYPECTOMY;  Surgeon: Malissa Hippo, MD;  Location: [facility];  Service: Endoscopy;;  polyp at ampulla   POLYPECTOMY  03/25/2019   Procedure: AMPULLECTOMY;  Surgeon: Meridee Score Netty Starring., MD;  Location: [facility] ENDOSCOPY;  Service: Gastroenterology;;   POLYPECTOMY  11/28/2022   Procedure: POLYPECTOMY;  Surgeon: Lemar Lofty., MD;  Location: [facility];  Service: Gastroenterology;;   REMOVAL OF STONES  03/08/2021   Procedure: REMOVAL OF STONES;  Surgeon: Lemar Lofty., MD;  Location: [facility];  Service: Gastroenterology;;   Right hand surgery     RIGHT HEART CATH AND CORONARY/GRAFT ANGIOGRAPHY N/A 12/16/2019   Procedure: RIGHT HEART CATH AND CORONARY/GRAFT ANGIOGRAPHY;  Surgeon: Yvonne Kendall, MD;  Location: [facility] INVAS[facility] CV LAB;  Service: Cardiovascular;  Laterality: N/A;   SPHINCTEROTOMY  03/25/2019   Procedure: SPHINCTEROTOMY;  Surgeon: Mansouraty, Netty Starring., MD;  Location: [facility] ENDOSCOPY;  Service: Gastroenterology;;   SUBMUCOSAL LIFTING INJECTION  11/28/2022   Procedure: SUBMUCOSAL LIFTING INJECTION;  Surgeon: Lemar Lofty., MD;  Location: [facility];  Service: Gastroenterology;;   SUBMUCOSAL  TATTOO INJECTION  11/28/2022   Procedure: SUBMUCOSAL TATTOO INJECTION;  Surgeon: Lemar Lofty., MD;  Location: [facility];  Service: Gastroenterology;;   TIBIA IM NAIL INSERTION Right 04/07/2016   Procedure: FIXATION OF RIGHT TIBIA FRACTURE;  Surgeon: Valeria Batman, MD;  Location: [facility] OR;  Service: Orthopedics;  Laterality: Right;     No Known Allergies    Family History  Problem Relation Age of Onset   Coronary artery disease Other    Colon cancer Neg Hx    Esophageal cancer Neg Hx    Inflammatory bowel disease Neg Hx    Liver disease Neg Hx    Pancreatic cancer Neg Hx    Rectal cancer Neg Hx    Stomach cancer Neg Hx      Social History Mr. Lebrecht reports that he has been smoking cigarettes. He started smoking about 58 years ago. He has a 56 pack-year smoking history. He has never used smokeless tobacco. Mr. Greenhagen reports current alcohol use.   Review of Systems CONSTITUTIONAL: No weight loss, fever, chills, weakness or fatigue.  HEENT: Eyes: No visual loss, blurred vision, double vision or yellow sclerae.No hearing loss, sneezing, congestion, runny nose or sore throat.  SKIN: No rash or itching.  CARDIOVASCULAR: per hpi RESPIRATORY: No shortness of breath, cough or sputum.  GASTROINTESTINAL: No anorexia, nausea, vomiting or diarrhea. No abdominal pain or blood.  GENITOURINARY: No burning on urination, no polyuria NEUROLOGICAL: No headache, dizziness, syncope, paralysis, ataxia, numbness or tingling in the extremities. No change in bowel or bladder control.  MUSCULOSKELETAL: No muscle, back pain, joint pain or stiffness.  LYMPHATICS: No enlarged nodes. No history of splenectomy.  PSYCHIATRIC: No history of depression or anxiety.  ENDOCRINOLOGIC: No reports of sweating, cold or heat intolerance. No polyuria or polydipsia.  Marland Kitchen   Physical Examination Today's Vitals   03/07/23 1101 03/07/23 1104  BP: (!) 158/78 (!) 156/76  Pulse: 70   SpO2: 97%   Weight:  206 lb (93.4 kg)   Height: 6' (1.829 m)    Body mass index is 27.94 kg/m.  Gen: resting comfortably, no acute distress HEENT: no scleral icterus, pupils equal round and reactive, no palptable cervical adenopathy,  CV: RRR, no mrg, no jvd Resp: Clear to auscultation bilaterally GI:  abdomen is soft, non-tender, non-distended, normal bowel sounds, no hepatosplenomegaly MSK: extremities are warm, no edema.  Skin: warm, no rash Neuro:  no focal deficits Psych: appropriate affect   Diagnostic Studies  02/2015 echo Study Conclusions   - Left ventricle: The cavity size was normal. There was mild   concentric hypertrophy. Systolic function was normal. The   estimated ejection fraction was in the range of 55% to 60%.   Possible hypokinesis of the basalinferior myocardium. Marked   bradycardia limits evaluation of LV diastolic function. - Aortic valve: There was trivial regurgitation. - Left atrium: The atrium was moderately dilated. - Right ventricle: Systolic function was mildly reduced.     02/2015 nuclear stress There was no ST segment deviation noted during stress. T wave inversion of 1 mm was noted during stress in the I, II, aVF, V5 and V6 leads. T wave inversion persisted. Defect 1: There is a medium defect of severe severity present in the apical anterior, apical inferior and apex location. Findings consistent with prior myocardial infarction. There is no evidence of ischemia. This is an intermediate risk study. Nuclear stress EF: 39%.     Assessment and Plan  1. CAD - severe history of CAD/extensive disease, have continued ASA while on coumadin in this setting.  -no recent symptoms, continue current meds   2. LE edema - well controlled, continue to monitor   3.LV thrombus - 07/2020 echo show resolution, but apical sluggish flow, high risk of recurrence and thus have continued coumadin indefinitely - discussed chagning to DOAC, he prefers to remain on coumadin   4.  HTN - white coat HTN pattern in the past which is also present today, home bp's at goal - continue current meds   5. Hyperlipidemia -at goal, continue current meds. Upcoming labs with pcp  F/u 6 months      Antoine Poche, M.D.

## 2023-03-07 NOTE — Patient Instructions (Signed)
Medication Instructions:  Continue all current medications.  Labwork: none  Testing/Procedures: none  Follow-Up: 6 months   Any Other Special Instructions Will Be Listed Below (If Applicable).  If you need a refill on your cardiac medications before your next appointment, please call your pharmacy.  

## 2023-03-09 DIAGNOSIS — H43813 Vitreous degeneration, bilateral: Secondary | ICD-10-CM | POA: Diagnosis not present

## 2023-03-09 DIAGNOSIS — H353114 Nonexudative age-related macular degeneration, right eye, advanced atrophic with subfoveal involvement: Secondary | ICD-10-CM | POA: Diagnosis not present

## 2023-03-09 DIAGNOSIS — H353124 Nonexudative age-related macular degeneration, left eye, advanced atrophic with subfoveal involvement: Secondary | ICD-10-CM | POA: Diagnosis not present

## 2023-03-09 DIAGNOSIS — H35033 Hypertensive retinopathy, bilateral: Secondary | ICD-10-CM | POA: Diagnosis not present

## 2023-03-11 DIAGNOSIS — F17210 Nicotine dependence, cigarettes, uncomplicated: Secondary | ICD-10-CM | POA: Diagnosis not present

## 2023-03-11 DIAGNOSIS — F419 Anxiety disorder, unspecified: Secondary | ICD-10-CM | POA: Diagnosis not present

## 2023-03-11 DIAGNOSIS — I10 Essential (primary) hypertension: Secondary | ICD-10-CM | POA: Diagnosis not present

## 2023-03-11 DIAGNOSIS — K2100 Gastro-esophageal reflux disease with esophagitis, without bleeding: Secondary | ICD-10-CM | POA: Diagnosis not present

## 2023-03-11 DIAGNOSIS — J449 Chronic obstructive pulmonary disease, unspecified: Secondary | ICD-10-CM | POA: Diagnosis not present

## 2023-03-11 DIAGNOSIS — Z955 Presence of coronary angioplasty implant and graft: Secondary | ICD-10-CM | POA: Diagnosis not present

## 2023-03-11 DIAGNOSIS — R739 Hyperglycemia, unspecified: Secondary | ICD-10-CM | POA: Diagnosis not present

## 2023-03-11 DIAGNOSIS — Z6828 Body mass index (BMI) 28.0-28.9, adult: Secondary | ICD-10-CM | POA: Diagnosis not present

## 2023-03-13 ENCOUNTER — Ambulatory Visit (HOSPITAL_COMMUNITY): Admission: RE | Admit: 2023-03-13 | Payer: Medicare HMO | Source: Home / Self Care | Admitting: Gastroenterology

## 2023-03-13 ENCOUNTER — Other Ambulatory Visit: Payer: Self-pay

## 2023-03-13 SURGERY — COLONOSCOPY WITH PROPOFOL
Anesthesia: Monitor Anesthesia Care

## 2023-03-13 MED ORDER — NA SULFATE-K SULFATE-MG SULF 17.5-3.13-1.6 GM/177ML PO SOLN: 1.0000 | ORAL | 0 refills | Status: DC

## 2023-03-13 NOTE — Telephone Encounter (Signed)
Colon EMR has been rescheduled to 05/08/23 at 8 am at [facility] with GM   Colon EMR scheduled, pt instructed and medications reviewed.  Patient instructions mailed to home.  Patient to call with any questions or concerns.

## 2023-03-13 NOTE — Telephone Encounter (Signed)
Mansouraty, Netty Starring., MD  Loretha Stapler, RN Fabiano Ginley, Please move forward with scheduling this patient for colonoscopy with EMR 90-minute slot. We should have the Coumadin hold/cardiology optimization-clearance. They are aware that you would be reaching out by the end of the week with a date and they would like to do that before the end of the year. Thanks. GM

## 2023-03-20 ENCOUNTER — Ambulatory Visit: Payer: Medicare HMO | Attending: Cardiology | Admitting: *Deleted

## 2023-03-20 DIAGNOSIS — I513 Intracardiac thrombosis, not elsewhere classified: Secondary | ICD-10-CM

## 2023-03-20 DIAGNOSIS — Z5181 Encounter for therapeutic drug level monitoring: Secondary | ICD-10-CM

## 2023-03-20 LAB — POCT INR: INR: 1.7 — AB (ref 2.0–3.0)

## 2023-03-20 NOTE — Patient Instructions (Signed)
Increase warfarin to 1 tablet daily except 1/2 tablet on Fridays Recheck INR in 2 wks.  Colonoscopy was moved to 11/18 because pt had covid.

## 2023-04-03 ENCOUNTER — Ambulatory Visit: Payer: Medicare HMO

## 2023-04-04 ENCOUNTER — Ambulatory Visit: Payer: Medicare HMO | Attending: Internal Medicine | Admitting: *Deleted

## 2023-04-04 DIAGNOSIS — I513 Intracardiac thrombosis, not elsewhere classified: Secondary | ICD-10-CM

## 2023-04-04 DIAGNOSIS — Z5181 Encounter for therapeutic drug level monitoring: Secondary | ICD-10-CM

## 2023-04-04 LAB — POCT INR: INR: 3.1 — AB (ref 2.0–3.0)

## 2023-04-04 NOTE — Patient Instructions (Signed)
Continue warfarin 1 tablet daily except 1/2 tablet on Fridays Recheck INR in 4 wks.  Colonoscopy was moved to 11/18 because pt had covid.

## 2023-04-11 DIAGNOSIS — F419 Anxiety disorder, unspecified: Secondary | ICD-10-CM | POA: Diagnosis not present

## 2023-04-11 DIAGNOSIS — L570 Actinic keratosis: Secondary | ICD-10-CM | POA: Diagnosis not present

## 2023-04-11 DIAGNOSIS — Z955 Presence of coronary angioplasty implant and graft: Secondary | ICD-10-CM | POA: Diagnosis not present

## 2023-04-11 DIAGNOSIS — Z6828 Body mass index (BMI) 28.0-28.9, adult: Secondary | ICD-10-CM | POA: Diagnosis not present

## 2023-04-11 DIAGNOSIS — Z23 Encounter for immunization: Secondary | ICD-10-CM | POA: Diagnosis not present

## 2023-04-11 DIAGNOSIS — J449 Chronic obstructive pulmonary disease, unspecified: Secondary | ICD-10-CM | POA: Diagnosis not present

## 2023-04-11 DIAGNOSIS — R739 Hyperglycemia, unspecified: Secondary | ICD-10-CM | POA: Diagnosis not present

## 2023-04-11 DIAGNOSIS — J441 Chronic obstructive pulmonary disease with (acute) exacerbation: Secondary | ICD-10-CM | POA: Diagnosis not present

## 2023-04-11 DIAGNOSIS — I10 Essential (primary) hypertension: Secondary | ICD-10-CM | POA: Diagnosis not present

## 2023-04-27 DIAGNOSIS — H353114 Nonexudative age-related macular degeneration, right eye, advanced atrophic with subfoveal involvement: Secondary | ICD-10-CM | POA: Diagnosis not present

## 2023-04-27 DIAGNOSIS — H353124 Nonexudative age-related macular degeneration, left eye, advanced atrophic with subfoveal involvement: Secondary | ICD-10-CM | POA: Diagnosis not present

## 2023-04-27 DIAGNOSIS — H35033 Hypertensive retinopathy, bilateral: Secondary | ICD-10-CM | POA: Diagnosis not present

## 2023-04-27 DIAGNOSIS — H43813 Vitreous degeneration, bilateral: Secondary | ICD-10-CM | POA: Diagnosis not present

## 2023-05-01 ENCOUNTER — Ambulatory Visit: Payer: Medicare HMO | Attending: Internal Medicine | Admitting: *Deleted

## 2023-05-01 ENCOUNTER — Encounter (HOSPITAL_COMMUNITY): Payer: Self-pay | Admitting: Gastroenterology

## 2023-05-01 DIAGNOSIS — I513 Intracardiac thrombosis, not elsewhere classified: Secondary | ICD-10-CM

## 2023-05-01 DIAGNOSIS — Z5181 Encounter for therapeutic drug level monitoring: Secondary | ICD-10-CM

## 2023-05-01 LAB — POCT INR: INR: 1.7 — AB (ref 2.0–3.0)

## 2023-05-01 NOTE — Patient Instructions (Signed)
Take warfarin 2 tablets tonight then resume 1 tablet daily except 1/2 tablet on Fridays Pending colonoscopy 05/08/23.  Will take alst dose of warfarin 05/02/23 and restart at above dose night of procedure if ok with MD Recheck INR in 2 wks.  Colonoscopy was moved to 11/18 because pt had covid.

## 2023-05-01 NOTE — Progress Notes (Signed)
Pre op call eval Name:Maguire Jarion Prante PA Cardiologist- Branch  EKG-02/24/23 Echo-08/11/20 Cath-12/16/19 Stress-n/a ICD/PM-n/a Blood thinner- Coumadin hold 5 days GLP-1- n/a  Hx:DM,CAD, HTN, MIx6, COPD, AAA repair, CABG. Patient last saw cardiology 03/07/23 with recommened 6 month f/u. Patient denies any cardiac issues, does use 02 occasionally at night, when uses, is set for 2.5 L. Anesthesia Review: Yes

## 2023-05-08 ENCOUNTER — Other Ambulatory Visit: Payer: Self-pay

## 2023-05-08 ENCOUNTER — Ambulatory Visit (HOSPITAL_COMMUNITY): Payer: Self-pay

## 2023-05-08 ENCOUNTER — Encounter (HOSPITAL_COMMUNITY): Payer: Self-pay | Admitting: Gastroenterology

## 2023-05-08 ENCOUNTER — Encounter (HOSPITAL_COMMUNITY)
Admission: RE | Disposition: A | Discharge status: Home / Self Care | Payer: Self-pay | Source: Home / Self Care | Attending: Gastroenterology

## 2023-05-08 ENCOUNTER — Ambulatory Visit (HOSPITAL_BASED_OUTPATIENT_CLINIC_OR_DEPARTMENT_OTHER): Payer: Self-pay

## 2023-05-08 ENCOUNTER — Ambulatory Visit (HOSPITAL_COMMUNITY)
Admission: RE | Admit: 2023-05-08 | Discharge: 2023-05-08 | Disposition: A | Discharge status: Home / Self Care | Payer: Medicare HMO | Attending: Gastroenterology | Admitting: Gastroenterology

## 2023-05-08 DIAGNOSIS — K219 Gastro-esophageal reflux disease without esophagitis: Secondary | ICD-10-CM | POA: Diagnosis not present

## 2023-05-08 DIAGNOSIS — D126 Benign neoplasm of colon, unspecified: Secondary | ICD-10-CM

## 2023-05-08 DIAGNOSIS — D127 Benign neoplasm of rectosigmoid junction: Secondary | ICD-10-CM | POA: Diagnosis not present

## 2023-05-08 DIAGNOSIS — Z1211 Encounter for screening for malignant neoplasm of colon: Secondary | ICD-10-CM

## 2023-05-08 DIAGNOSIS — I252 Old myocardial infarction: Secondary | ICD-10-CM | POA: Insufficient documentation

## 2023-05-08 DIAGNOSIS — I10 Essential (primary) hypertension: Secondary | ICD-10-CM | POA: Diagnosis not present

## 2023-05-08 DIAGNOSIS — Z951 Presence of aortocoronary bypass graft: Secondary | ICD-10-CM | POA: Insufficient documentation

## 2023-05-08 DIAGNOSIS — Z860100 Personal history of colon polyps, unspecified: Secondary | ICD-10-CM | POA: Diagnosis not present

## 2023-05-08 DIAGNOSIS — D124 Benign neoplasm of descending colon: Secondary | ICD-10-CM

## 2023-05-08 DIAGNOSIS — Z860101 Personal history of adenomatous and serrated colon polyps: Secondary | ICD-10-CM | POA: Diagnosis not present

## 2023-05-08 DIAGNOSIS — Z955 Presence of coronary angioplasty implant and graft: Secondary | ICD-10-CM | POA: Insufficient documentation

## 2023-05-08 DIAGNOSIS — D122 Benign neoplasm of ascending colon: Secondary | ICD-10-CM

## 2023-05-08 DIAGNOSIS — D125 Benign neoplasm of sigmoid colon: Secondary | ICD-10-CM

## 2023-05-08 DIAGNOSIS — J449 Chronic obstructive pulmonary disease, unspecified: Secondary | ICD-10-CM | POA: Diagnosis not present

## 2023-05-08 DIAGNOSIS — I2510 Atherosclerotic heart disease of native coronary artery without angina pectoris: Secondary | ICD-10-CM | POA: Insufficient documentation

## 2023-05-08 DIAGNOSIS — D135 Benign neoplasm of extrahepatic bile ducts: Secondary | ICD-10-CM

## 2023-05-08 DIAGNOSIS — K641 Second degree hemorrhoids: Secondary | ICD-10-CM | POA: Insufficient documentation

## 2023-05-08 DIAGNOSIS — D123 Benign neoplasm of transverse colon: Secondary | ICD-10-CM | POA: Diagnosis not present

## 2023-05-08 DIAGNOSIS — K644 Residual hemorrhoidal skin tags: Secondary | ICD-10-CM | POA: Diagnosis not present

## 2023-05-08 DIAGNOSIS — D128 Benign neoplasm of rectum: Secondary | ICD-10-CM | POA: Diagnosis not present

## 2023-05-08 DIAGNOSIS — E119 Type 2 diabetes mellitus without complications: Secondary | ICD-10-CM | POA: Insufficient documentation

## 2023-05-08 DIAGNOSIS — Z09 Encounter for follow-up examination after completed treatment for conditions other than malignant neoplasm: Secondary | ICD-10-CM | POA: Diagnosis not present

## 2023-05-08 DIAGNOSIS — K5730 Diverticulosis of large intestine without perforation or abscess without bleeding: Secondary | ICD-10-CM | POA: Diagnosis not present

## 2023-05-08 DIAGNOSIS — F17210 Nicotine dependence, cigarettes, uncomplicated: Secondary | ICD-10-CM | POA: Insufficient documentation

## 2023-05-08 HISTORY — PX: POLYPECTOMY: SHX5525

## 2023-05-08 HISTORY — PX: SUBMUCOSAL LIFTING INJECTION: SHX6855

## 2023-05-08 HISTORY — PX: ENDOSCOPIC MUCOSAL RESECTION: SHX6839

## 2023-05-08 HISTORY — PX: HEMOSTASIS CLIP PLACEMENT: SHX6857

## 2023-05-08 HISTORY — PX: COLONOSCOPY WITH PROPOFOL: SHX5780

## 2023-05-08 LAB — GLUCOSE, CAPILLARY: Glucose-Capillary: 133 mg/dL — ABNORMAL HIGH (ref 70–99)

## 2023-05-08 SURGERY — COLONOSCOPY WITH PROPOFOL
Anesthesia: Monitor Anesthesia Care

## 2023-05-08 MED ORDER — PROPOFOL 500 MG/50ML IV EMUL
INTRAVENOUS | Status: DC | PRN
  Administered 2023-05-08: 100 ug/kg/min via INTRAVENOUS

## 2023-05-08 MED ORDER — PROPOFOL 10 MG/ML IV BOLUS
INTRAVENOUS | Status: AC
  Filled 2023-05-08: qty 20

## 2023-05-08 MED ORDER — LIDOCAINE 2% (20 MG/ML) 5 ML SYRINGE
INTRAMUSCULAR | Status: DC | PRN
  Administered 2023-05-08: 100 mg via INTRAVENOUS

## 2023-05-08 MED ORDER — SODIUM CHLORIDE 0.9 % IV SOLN: INTRAVENOUS | Status: DC

## 2023-05-08 MED ORDER — PROPOFOL 1000 MG/100ML IV EMUL
INTRAVENOUS | Status: AC
  Filled 2023-05-08: qty 100

## 2023-05-08 MED ORDER — WARFARIN SODIUM 2 MG PO TABS: ORAL_TABLET | ORAL | Status: DC

## 2023-05-08 MED ORDER — PROPOFOL 10 MG/ML IV BOLUS
INTRAVENOUS | Status: DC | PRN
  Administered 2023-05-08 (×3): 20 mg via INTRAVENOUS

## 2023-05-08 SURGICAL SUPPLY — 20 items

## 2023-05-08 NOTE — H&P (Signed)
GASTROENTEROLOGY PROCEDURE H&P NOTE   Primary Care Physician: Lovey Newcomer, PA  HPI: Clifford Jones is a 72 y.o. male who presents for Colonoscopy for further evaluation of colon polyps and known large polyp.  Past Medical History:  Diagnosis Date   Ampullary adenoma    Arthritis    Reported by patient   Cancer [facility])    Reported by patient   Claustrophobia    Closed fracture of right distal tibia    COPD (chronic obstructive pulmonary disease) (HCC)    Reported by patient   Coronary artery disease    a. 1999 Inf MI;  b. 2003 CABGx6;  c. 2008 NSTEMI, occluded SVG to marginal and diagonal system. 2 DES placed to marginal system;  c. NSTEMI - 02/2012 Xience Xpedition DES to oS-OM and POBA to mid lesion;  c. 10/2013 NSTEMI/PCI: VG->OM 80 ISR, native OM 80/90 small, LIMA->LAD ok w/ 90 dLAD, D1 80, RIMA->RPL ok w/ 95 in RPL (2.5x14 Resolute DES), nl EF.   Dyspnea    Reported by patient   GERD (gastroesophageal reflux disease)    Hyperlipidemia    Hypertension    Myocardial infarction (HCC)    X 6   Pneumonia    S/P AAA repair    Tobacco abuse    Type 2 diabetes mellitus without complication, without long-term current use of insulin (HCC)    Wears dentures    Wears glasses    Past Surgical History:  Procedure Laterality Date   ABDOMINAL AORTIC ANEURYSM REPAIR  2006   APPENDECTOMY     BIOPSY  03/08/2021   Procedure: BIOPSY;  Surgeon: Lemar Lofty., MD;  Location: [facility];  Service: Gastroenterology;;   CATARACT EXTRACTION W/PHACO Right 11/05/2021   Procedure: CATARACT EXTRACTION PHACO AND INTRAOCULAR LENS PLACEMENT (IOC);  Surgeon: Fabio Pierce, MD;  Location: [facility];  Service: Ophthalmology;  Laterality: Right;  CDE: 11.33   COLONOSCOPY WITH PROPOFOL N/A 11/28/2022   Procedure: COLONOSCOPY WITH PROPOFOL;  Surgeon: Meridee Score Netty Starring., MD;  Location: [facility];  Service: Gastroenterology;  Laterality: N/A;   CORONARY ANGIOPLASTY WITH STENT PLACEMENT      CORONARY ARTERY BYPASS GRAFT  2003   ENDOSCOPIC MUCOSAL RESECTION  11/28/2022   Procedure: ENDOSCOPIC MUCOSAL RESECTION;  Surgeon: Meridee Score Netty Starring., MD;  Location: [facility];  Service: Gastroenterology;;   ENDOSCOPIC RETROGRADE CHOLANGIOPANCREATOGRAPHY (ERCP) WITH PROPOFOL N/A 03/25/2019   Procedure: ENDOSCOPIC RETROGRADE CHOLANGIOPANCREATOGRAPHY (ERCP) WITH PROPOFOL;  Surgeon: Lemar Lofty., MD;  Location: [facility] ENDOSCOPY;  Service: Gastroenterology;  Laterality: N/A;   ENDOSCOPIC RETROGRADE CHOLANGIOPANCREATOGRAPHY (ERCP) WITH PROPOFOL N/A 03/08/2021   Procedure: ENDOSCOPIC RETROGRADE CHOLANGIOPANCREATOGRAPHY (ERCP) WITH PROPOFOL;  Surgeon: Meridee Score Netty Starring., MD;  Location: [facility];  Service: Gastroenterology;  Laterality: N/A;   ENDOSCOPIC RETROGRADE CHOLANGIOPANCREATOGRAPHY (ERCP) WITH PROPOFOL N/A 04/14/2022   Procedure: ENDOSCOPIC RETROGRADE CHOLANGIOPANCREATOGRAPHY (ERCP) WITH PROPOFOL;  Surgeon: Meridee Score Netty Starring., MD;  Location: [facility];  Service: Gastroenterology;  Laterality: N/A;   ESOPHAGEAL DILATION  07/06/2018   Procedure: ESOPHAGEAL DILATION;  Surgeon: Malissa Hippo, MD;  Location: [facility];  Service: Endoscopy;;   ESOPHAGOGASTRODUODENOSCOPY N/A 07/06/2018   Procedure: ESOPHAGOGASTRODUODENOSCOPY (EGD);  Surgeon: Malissa Hippo, MD;  [facility]: [facility];  Service: Endoscopy;  Laterality: N/A;   ESOPHAGOGASTRODUODENOSCOPY N/A 08/16/2018   Procedure: ESOPHAGOGASTRODUODENOSCOPY (EGD);  Surgeon: Rachael Fee, MD;  Location: [facility];  Service: Endoscopy;  Laterality: N/A;   ESOPHAGOGASTRODUODENOSCOPY (EGD) WITH PROPOFOL N/A 03/25/2019   Procedure: ESOPHAGOGASTRODUODENOSCOPY (EGD) WITH PROPOFOL;  Surgeon: Lemar Lofty., MD;  Location: [facility] ENDOSCOPY;  Service: Gastroenterology;  Laterality: N/A;   EUS N/A 08/16/2018   Procedure: UPPER ENDOSCOPIC ULTRASOUND (EUS) RADIAL;  Surgeon: Rachael Fee, MD;  Location: [facility];   Service: Endoscopy;  Laterality: N/A;   EUS N/A 03/25/2019   Procedure: UPPER ENDOSCOPIC ULTRASOUND (EUS) RADIAL;  Surgeon: Lemar Lofty., MD;  Location: [facility] ENDOSCOPY;  Service: Gastroenterology;  Laterality: N/A;   FOREIGN BODY REMOVAL  07/06/2018   Procedure: FOREIGN BODY REMOVAL;  Surgeon: Malissa Hippo, MD;  Location: [facility];  Service: Endoscopy;;   HEMOSTASIS CLIP PLACEMENT  11/28/2022   Procedure: HEMOSTASIS CLIP PLACEMENT;  Surgeon: Lemar Lofty., MD;  Location: [facility];  Service: Gastroenterology;;   HERNIA REPAIR     umbicical   LEFT HEART CATHETERIZATION WITH CORONARY ANGIOGRAM N/A 03/05/2012   Procedure: LEFT HEART CATHETERIZATION WITH CORONARY ANGIOGRAM;  Surgeon: Herby Abraham, MD;  Location: [facility] CATH LAB;  Service: Cardiovascular;  Laterality: N/A;   LEFT HEART CATHETERIZATION WITH CORONARY ANGIOGRAM N/A 10/23/2013   Procedure: LEFT HEART CATHETERIZATION WITH CORONARY ANGIOGRAM;  Surgeon: Micheline Chapman, MD;  Location: [facility] CATH LAB;  Service: Cardiovascular;  Laterality: N/A;   LEFT HEART CATHETERIZATION WITH CORONARY/GRAFT ANGIOGRAM  10/22/2013   Procedure: LEFT HEART CATHETERIZATION WITH Isabel Caprice;  Surgeon: Marykay Lex, MD;  Location: [facility] CATH LAB;  Service: Cardiovascular;;   PANCREATIC STENT PLACEMENT  03/25/2019   Procedure: PANCREATIC STENT PLACEMENT;  Surgeon: Meridee Score Netty Starring., MD;  Location: [facility] ENDOSCOPY;  Service: Gastroenterology;;   PANCREATIC STENT PLACEMENT  03/08/2021   Procedure: PANCREATIC STENT PLACEMENT;  Surgeon: Lemar Lofty., MD;  Location: [facility];  Service: Gastroenterology;;   PANCREATIC STENT PLACEMENT  04/14/2022   Procedure: PANCREATIC STENT PLACEMENT;  Surgeon: Lemar Lofty., MD;  Location: [facility];  Service: Gastroenterology;;   PERCUTANEOUS CORONARY STENT INTERVENTION (PCI-S) N/A 03/07/2012   Procedure: PERCUTANEOUS CORONARY STENT INTERVENTION (PCI-S);  Surgeon: Tonny Bollman, MD;  Location: [facility] CATH LAB;  Service: Cardiovascular;  Laterality: N/A;   PERCUTANEOUS CORONARY STENT INTERVENTION (PCI-S) Right 10/23/2013   Procedure: PERCUTANEOUS CORONARY STENT INTERVENTION (PCI-S);  Surgeon: Micheline Chapman, MD;  Location: [facility] CATH LAB;  Service: Cardiovascular;  Laterality: Right;   POLYPECTOMY  07/06/2018   Procedure: POLYPECTOMY;  Surgeon: Malissa Hippo, MD;  Location: [facility];  Service: Endoscopy;;  polyp at ampulla   POLYPECTOMY  03/25/2019   Procedure: AMPULLECTOMY;  Surgeon: Meridee Score Netty Starring., MD;  Location: [facility] ENDOSCOPY;  Service: Gastroenterology;;   POLYPECTOMY  11/28/2022   Procedure: POLYPECTOMY;  Surgeon: Lemar Lofty., MD;  Location: [facility];  Service: Gastroenterology;;   REMOVAL OF STONES  03/08/2021   Procedure: REMOVAL OF STONES;  Surgeon: Lemar Lofty., MD;  Location: [facility];  Service: Gastroenterology;;   Right hand surgery     RIGHT HEART CATH AND CORONARY/GRAFT ANGIOGRAPHY N/A 12/16/2019   Procedure: RIGHT HEART CATH AND CORONARY/GRAFT ANGIOGRAPHY;  Surgeon: Yvonne Kendall, MD;  Location: [facility] INVASIVE CV LAB;  Service: Cardiovascular;  Laterality: N/A;   SPHINCTEROTOMY  03/25/2019   Procedure: SPHINCTEROTOMY;  Surgeon: Meridee Score Netty Starring., MD;  Location: [facility] ENDOSCOPY;  Service: Gastroenterology;;   SUBMUCOSAL LIFTING INJECTION  11/28/2022   Procedure: SUBMUCOSAL LIFTING INJECTION;  Surgeon: Lemar Lofty., MD;  Location: [facility];  Service: Gastroenterology;;   SUBMUCOSAL TATTOO INJECTION  11/28/2022   Procedure: SUBMUCOSAL TATTOO INJECTION;  Surgeon: Lemar Lofty., MD;  Location: [facility] ENDOSCOPY;  Service:  Gastroenterology;;   TIBIA IM NAIL INSERTION Right 04/07/2016   Procedure: FIXATION OF RIGHT TIBIA FRACTURE;  Surgeon: Valeria Batman, MD;  Location: [facility] OR;  Service: Orthopedics;  Laterality: Right;   Current Facility-Administered Medications  Medication Dose Route Frequency  Provider Last Rate Last Admin   0.9 %  sodium chloride infusion   Intravenous Continuous Mansouraty, Netty Starring., MD        Current Facility-Administered Medications:    0.9 %  sodium chloride infusion, , Intravenous, Continuous, Mansouraty, Netty Starring., MD No Known Allergies Family History  Problem Relation Age of Onset   Coronary artery disease Other    Colon cancer Neg Hx    Esophageal cancer Neg Hx    Inflammatory bowel disease Neg Hx    Liver disease Neg Hx    Pancreatic cancer Neg Hx    Rectal cancer Neg Hx    Stomach cancer Neg Hx    Social History   Socioeconomic History   Marital status: Legally Separated    Spouse name: Not on file   Number of children: Not on file   Years of education: Not on file   Highest education level: Not on file  Occupational History   Not on file  Tobacco Use   Smoking status: Some Days    Current packs/day: 0.00    Average packs/day: 1 pack/day for 56.0 years (56.0 ttl pk-yrs)    Types: Cigarettes    Start date: 10/04/1964    Last attempt to quit: 10/2020    Years since quitting: 2.5   Smokeless tobacco: Never   Tobacco comments:    since November 2020-has smoked a total of 6 packs    08/11/2021 Per patient Smoked 3 Packs of cigarettes in the past 8 months  Vaping Use   Vaping status: Never Used  Substance and Sexual Activity   Alcohol use: Yes    Comment: Maybe 2 beers a month and 2 shotsa month   Drug use: No   Sexual activity: Yes  Other Topics Concern   Not on file  Social History Narrative   Disabled.  Former truck Software engineer. Divorced and remarried x 2         Social Determinants of Health   Financial Resource Strain: Not on file  Food Insecurity: Not on file  Transportation Needs: Not on file  Physical Activity: Not on file  Stress: Not on file  Social Connections: Not on file  Intimate Partner Violence: Not on file    Physical Exam: Today's Vitals   05/01/23 1108 05/08/23 0726  BP:  (!)  176/90  Pulse:  72  Resp:  10  Temp:  97.9 F (36.6 C)  TempSrc:  Tympanic  SpO2:  94%  Weight: 93.4 kg 93.4 kg  Height:  6' (1.829 m)  PainSc:  0-No pain   Body mass index is 27.93 kg/m. GEN: NAD EYE: Sclerae anicteric ENT: MMM CV: Non-tachycardic GI: Soft, NT/ND NEURO:  Alert & Oriented x 3  Lab Results: No results for input(s): "WBC", "HGB", "HCT", "PLT" in the last 72 hours. BMET No results for input(s): "NA", "K", "CL", "CO2", "GLUCOSE", "BUN", "CREATININE", "CALCIUM" in the last 72 hours. LFT No results for input(s): "PROT", "ALBUMIN", "AST", "ALT", "ALKPHOS", "BILITOT", "BILIDIR", "IBILI" in the last 72 hours. PT/INR No results for input(s): "LABPROT", "INR" in the last 72 hours.   Impression / Plan: This is a 72 y.o.male who presents for Colonoscopy for further evaluation of colon polyps  and known large polyp.  The risks and benefits of endoscopic evaluation/treatment were discussed with the patient and/or family; these include but are not limited to the risk of perforation, infection, bleeding, missed lesions, lack of diagnosis, severe illness requiring hospitalization, as well as anesthesia and sedation related illnesses.  The patient's history has been reviewed, patient examined, no change in status, and deemed stable for procedure.  The patient and/or family is agreeable to proceed.    Corliss Parish, MD [facility]oscopy Office # [phone]

## 2023-05-08 NOTE — Op Note (Signed)
[facility] Patient Name: Clifford Jones Procedure Date: 05/08/2023 MRN: [id number] Attending MD: Corliss Parish , MD, [id number] Date of Birth: Nov 12, 1950 CSN: [id number] Age: 72 Admit Type: Outpatient Procedure:                Colonoscopy Indications:              Surveillance: History of numerous (>25) adenomas on                            last colonoscopy earlier this year, Incidental -                            Excision of colonic polyp Providers:                Corliss Parish, MD, Norman Clay, RN, Harrington Challenger,                            Technician Referring MD:              Medicines:                Monitored Anesthesia Care Complications:            No immediate complications. Estimated Blood Loss:     Estimated blood loss was minimal. Procedure:                Pre-Anesthesia Assessment:                           - Prior to the procedure, a History and Physical                            was performed, and patient medications and                            allergies were reviewed. The patient's tolerance of                            previous anesthesia was also reviewed. The risks                            and benefits of the procedure and the sedation                            options and risks were discussed with the patient.                            All questions were answered, and informed consent                            was obtained. Prior Anticoagulants: The patient has                            taken Coumadin (warfarin), last dose was 5 days  prior to procedure. ASA Grade Assessment: III - A                            patient with severe systemic disease. After                            reviewing the risks and benefits, the patient was                            deemed in satisfactory condition to undergo the                            procedure.                           After obtaining informed consent, the  colonoscope                            was passed under direct vision. Throughout the                            procedure, the patient's blood pressure, pulse, and                            oxygen saturations were monitored continuously. The                            CF-HQ190L ([id number]) Olympus colonoscope was                            introduced through the anus and advanced to the the                            cecum, identified by appendiceal orifice and                            ileocecal valve. The colonoscopy was performed                            without difficulty. The patient tolerated the                            procedure. The quality of the bowel preparation was                            adequate. The ileocecal valve, appendiceal orifice,                            and rectum were photographed. Scope In: 8:11:49 AM Scope Out: 8:59:30 AM Scope Withdrawal Time: 0 hours 43 minutes 7 seconds  Total Procedure Duration: 0 hours 47 minutes 41 seconds  Findings:      Skin tags were found on perianal exam.      The digital rectal exam findings include hemorrhoids. Pertinent       negatives  include no palpable rectal lesions.      14 sessile polyps were found in the rectum (3), sigmoid colon (2),       descending colon (4), transverse colon (2), hepatic flexure (1) and       ascending colon (2). The polyps were 2 to 8 mm in size. These polyps       were removed with a cold snare. Resection and retrieval were complete.      A 45 mm polyp was found in the recto-sigmoid colon. The polyp was       semi-sessile. Preparations were made for mucosal resection. Demarcation       of the lesion was performed with high-definition white light and narrow       band imaging to clearly identify the boundaries of the lesion. Endoclot       was injected to raise the lesion. Piecemeal mucosal resection using a       snare was performed (2 total pieces). Resection and retrieval were        complete. Resected tissue margins were examined and clear of polyp       tissue. Coagulation for tissue destruction to the margin using snare tip       soft coagulation was successful. To prevent bleeding after mucosal       resection, four hemostatic clips were successfully placed (MR       conditional). Clip manufacturer: [employer]. There was no       bleeding during, or at the end, of the procedure.      Multiple small-mouthed diverticula were found in the recto-sigmoid colon       and sigmoid colon.      Non-bleeding non-thrombosed external and internal hemorrhoids were found       during retroflexion, during perianal exam and during digital exam. The       hemorrhoids were Grade II (internal hemorrhoids that prolapse but reduce       spontaneously). Impression:               - Perianal skin tags found on perianal exam.                           - Hemorrhoids found on digital rectal exam.                           - 14, 2 to 8 mm polyps in the rectum, in the                            sigmoid colon, in the descending colon, in the                            transverse colon, at the hepatic flexure and in the                            ascending colon, removed with a cold snare.                            Resected and retrieved.                           - One 45 mm  polyp at the recto-sigmoid colon,                            removed with piecemeal mucosal resection. Resected                            and retrieved. Treated with STSC to margin. Clips                            (MR conditional) were placed. Clip manufacturer:                            [employer].                           - Diverticulosis in the recto-sigmoid colon and in                            the sigmoid colon.                           - Non-bleeding non-thrombosed external and internal                            hemorrhoids. Moderate Sedation:      Not Applicable - Patient had care per  Anesthesia. Recommendation:           - The patient will be observed post-procedure,                            until all discharge criteria are met.                           - Discharge patient to home.                           - Patient has a contact number available for                            emergencies. The signs and symptoms of potential                            delayed complications were discussed with the                            patient. Return to normal activities tomorrow.                            Written discharge instructions were provided to the                            patient.                           - High fiber diet.                           -  Use FiberCon 1-2 tablets PO daily.                           - May restart Coumadin on 11/20 to decrease risk of                            post interventional bleeding.                           - Continue present medications.                           - Await pathology results.                           - Repeat colonoscopy within 1 year for surveillance.                           - The findings and recommendations were discussed                            with the patient.                           - The findings and recommendations were discussed                            with the designated responsible adult. Procedure Code(s):        --- Professional ---                           [phone], Colonoscopy, flexible; with endoscopic                            mucosal resection                           [id number], 59, Colonoscopy, flexible; with removal of                            tumor(s), polyp(s), or other lesion(s) by snare                            technique Diagnosis Code(s):        --- Professional ---                           Z86.010, Personal history of colonic polyps                           D12.8, Benign neoplasm of rectum                           D12.5, Benign neoplasm of sigmoid colon                            D12.4, Benign neoplasm of descending  colon                           D12.3, Benign neoplasm of transverse colon (hepatic                            flexure or splenic flexure)                           D12.2, Benign neoplasm of ascending colon                           D12.7, Benign neoplasm of rectosigmoid junction                           K64.1, Second degree hemorrhoids                           K64.4, Residual hemorrhoidal skin tags                           K57.30, Diverticulosis of large intestine without                            perforation or abscess without bleeding CPT copyright 2022 American Medical Association. All rights reserved. The codes documented in this report are preliminary and upon coder review may  be revised to meet current compliance requirements. Corliss Parish, MD 05/08/2023 9:15:23 AM Number of Addenda: 0

## 2023-05-08 NOTE — Discharge Instructions (Signed)

## 2023-05-08 NOTE — Anesthesia Procedure Notes (Signed)
Date/Time: 05/08/2023 8:04 AM  Performed by: Florene Route, CRNAOxygen Delivery Method: Simple face mask

## 2023-05-08 NOTE — Anesthesia Postprocedure Evaluation (Signed)
Anesthesia Post Note  Patient: Clifford Jones  Procedure(s) Performed: COLONOSCOPY WITH PROPOFOL ENDOSCOPIC MUCOSAL RESECTION POLYPECTOMY SUBMUCOSAL LIFTING INJECTION HEMOSTASIS CLIP PLACEMENT     Patient location during evaluation: Endoscopy Anesthesia Type: MAC Level of consciousness: awake Pain management: pain level controlled Vital Signs Assessment: post-procedure vital signs reviewed and stable Respiratory status: spontaneous breathing Cardiovascular status: stable Postop Assessment: no apparent nausea or vomiting Anesthetic complications: no  No notable events documented.  Last Vitals:  Vitals:   05/08/23 0915 05/08/23 0920  BP:  (!) 181/78  Pulse: 64 60  Resp: 20 14  Temp:    SpO2: 100% 98%    Last Pain:  Vitals:   05/08/23 0920  TempSrc:   PainSc: 0-No pain                 Caren Macadam

## 2023-05-08 NOTE — Transfer of Care (Signed)
Immediate Anesthesia Transfer of Care Note  Patient: Clifford Jones  Procedure(s) Performed: COLONOSCOPY WITH PROPOFOL ENDOSCOPIC MUCOSAL RESECTION POLYPECTOMY SUBMUCOSAL LIFTING INJECTION HEMOSTASIS CLIP PLACEMENT  Patient Location: Endoscopy Unit  Anesthesia Type:MAC  Level of Consciousness: drowsy  Airway & Oxygen Therapy: Patient Spontanous Breathing and Patient connected to face mask oxygen  Post-op Assessment: Report given to RN and Post -op Vital signs reviewed and stable  Post vital signs: Reviewed and stable  Last Vitals:  Vitals Value Taken Time  BP    Temp    Pulse    Resp    SpO2      Last Pain:  Vitals:   05/08/23 0726  TempSrc: Tympanic  PainSc: 0-No pain         Complications: No notable events documented.

## 2023-05-08 NOTE — Anesthesia Preprocedure Evaluation (Addendum)
Anesthesia Evaluation  Patient identified by MRN, date of birth, ID band Patient awake    Reviewed: Allergy & Precautions, NPO status , Patient's Chart, lab work & pertinent test results  History of Anesthesia Complications Negative for: history of anesthetic complications  Airway Mallampati: I  TM Distance: >3 FB Neck ROM: Full    Dental no notable dental hx. (+) Edentulous Lower, Edentulous Upper   Pulmonary COPD, Current Smoker and Patient abstained from smoking.   Pulmonary exam normal        Cardiovascular hypertension, Pt. on medications + CAD, + Past MI, + Cardiac Stents, + CABG and + Peripheral Vascular Disease  Normal cardiovascular exam Rhythm:Regular Rate:Normal  TTE 2022: EF 50-55%, RWMAs present, mild LVH   Neuro/Psych   Anxiety     negative neurological ROS     GI/Hepatic Neg liver ROS,GERD  Medicated,,  Endo/Other  diabetes, Type 2    Renal/GU negative Renal ROS     Musculoskeletal  (+) Arthritis ,    Abdominal Normal abdominal exam  (+)   Peds  Hematology negative hematology ROS (+)   Anesthesia Other Findings Day of surgery medications reviewed with patient.  Reproductive/Obstetrics                             Anesthesia Physical Anesthesia Plan  ASA: 3  Anesthesia Plan: MAC   Post-op Pain Management: Minimal or no pain anticipated   Induction:   PONV Risk Score and Plan: 1 and Treatment may vary due to age or medical condition and Propofol infusion  Airway Management Planned: Natural Airway, Nasal Cannula and Simple Face Mask  Additional Equipment: None  Intra-op Plan:   Post-operative Plan:   Informed Consent: I have reviewed the patients History and Physical, chart, labs and discussed the procedure including the risks, benefits and alternatives for the proposed anesthesia with the patient or authorized representative who has indicated his/her  understanding and acceptance.       Plan Discussed with: CRNA  Anesthesia Plan Comments:        Anesthesia Quick Evaluation

## 2023-05-09 ENCOUNTER — Encounter: Payer: Self-pay | Admitting: Gastroenterology

## 2023-05-09 LAB — SURGICAL PATHOLOGY

## 2023-05-10 ENCOUNTER — Telehealth: Payer: Self-pay | Admitting: Gastroenterology

## 2023-05-10 NOTE — Telephone Encounter (Signed)
Patient advised of Dr Elesa Hacker recommendation to again HOLD coumadin today. Advised to restart coumadin tomorrow. Reminded patient that should he have worsening bleeding, new SOB, chest pain, dizziness or melena, he should have emergent evaluation. Patient verbalizes understanding of this information.

## 2023-05-10 NOTE — Telephone Encounter (Signed)
Have him hold the Coumadin today and restart tomorrow. Thanks. GM

## 2023-05-10 NOTE — Telephone Encounter (Signed)
Called to discuss colonoscopy results letter with patient. He verbalizes understanding of my explanation and denies any additional questions in regards to results.  Patient does state that following procedure Monday, he did notice some brb "clotting" from the rectum. States that blood was in the toilet bowl as well and describes it as "right much." Patient states that he had 1 additional episode of brb clot yesterday, though it was a much smaller amount. Has not had a bowel movement or any blood noted today. Patient denies any abdominal pain, rectal pain; no dizziness, shortness of breath or chest pain, no fever/chills, no melena. Patient normally on warfarin. Did complete 5 day hold prior to procedure. Is slated to restart anticoag today but would first like to await Dr Elesa Hacker additional recommendations based on recent bleeding.

## 2023-05-10 NOTE — Telephone Encounter (Signed)
Inbound call from patient stating he received his 11/18 colonoscopy results. Patient is requesting a call to discuss results. States it is better for him to hear and discuss results. Please advise, thank you.

## 2023-05-11 ENCOUNTER — Encounter (HOSPITAL_COMMUNITY): Payer: Self-pay | Admitting: Gastroenterology

## 2023-05-11 NOTE — Telephone Encounter (Signed)
Spoke with pt and he states he usually takes MOM if he is constipated and it works well for him. Let pt know if was fine for him to take MOM and to call back if he has any other concerns.

## 2023-05-11 NOTE — Telephone Encounter (Signed)
Inbound call from patient, states he has been constipated since Monday morning, following his procedure. He would like to know if he can take milk of magnesium to relieve the constipation.

## 2023-05-15 ENCOUNTER — Ambulatory Visit: Payer: Medicare HMO | Attending: Physician Assistant

## 2023-05-24 ENCOUNTER — Ambulatory Visit: Payer: Medicare HMO | Attending: Internal Medicine | Admitting: *Deleted

## 2023-05-24 DIAGNOSIS — I513 Intracardiac thrombosis, not elsewhere classified: Secondary | ICD-10-CM

## 2023-05-24 DIAGNOSIS — Z5181 Encounter for therapeutic drug level monitoring: Secondary | ICD-10-CM | POA: Diagnosis not present

## 2023-05-24 LAB — POCT INR: INR: 2 (ref 2.0–3.0)

## 2023-05-24 NOTE — Patient Instructions (Signed)
Continue warfarin 1 tablet daily except 1/2 tablet on Fridays Recheck in 4 wks

## 2023-06-19 DIAGNOSIS — H353114 Nonexudative age-related macular degeneration, right eye, advanced atrophic with subfoveal involvement: Secondary | ICD-10-CM | POA: Diagnosis not present

## 2023-06-26 ENCOUNTER — Ambulatory Visit: Payer: Medicare HMO | Attending: Cardiology | Admitting: *Deleted

## 2023-06-26 DIAGNOSIS — I513 Intracardiac thrombosis, not elsewhere classified: Secondary | ICD-10-CM

## 2023-06-26 DIAGNOSIS — Z5181 Encounter for therapeutic drug level monitoring: Secondary | ICD-10-CM

## 2023-06-26 LAB — POCT INR: INR: 2.1 (ref 2.0–3.0)

## 2023-06-26 NOTE — Patient Instructions (Signed)
 Continue warfarin 1 tablet daily except 1/2 tablet on Fridays Recheck in 5 wks

## 2023-07-04 DIAGNOSIS — L0291 Cutaneous abscess, unspecified: Secondary | ICD-10-CM | POA: Diagnosis not present

## 2023-07-04 DIAGNOSIS — Z6829 Body mass index (BMI) 29.0-29.9, adult: Secondary | ICD-10-CM | POA: Diagnosis not present

## 2023-07-04 DIAGNOSIS — L0211 Cutaneous abscess of neck: Secondary | ICD-10-CM | POA: Diagnosis not present

## 2023-07-04 DIAGNOSIS — R030 Elevated blood-pressure reading, without diagnosis of hypertension: Secondary | ICD-10-CM | POA: Diagnosis not present

## 2023-07-27 ENCOUNTER — Emergency Department (HOSPITAL_COMMUNITY): Payer: Medicare HMO

## 2023-07-27 ENCOUNTER — Observation Stay (HOSPITAL_COMMUNITY)
Admission: EM | Admit: 2023-07-27 | Discharge: 2023-07-28 | Disposition: A | Discharge status: Home / Self Care | Payer: Medicare HMO | Attending: Family Medicine | Admitting: Family Medicine

## 2023-07-27 ENCOUNTER — Other Ambulatory Visit: Payer: Self-pay

## 2023-07-27 ENCOUNTER — Encounter (HOSPITAL_COMMUNITY): Payer: Self-pay

## 2023-07-27 DIAGNOSIS — J432 Centrilobular emphysema: Secondary | ICD-10-CM | POA: Diagnosis not present

## 2023-07-27 DIAGNOSIS — Z951 Presence of aortocoronary bypass graft: Secondary | ICD-10-CM | POA: Diagnosis not present

## 2023-07-27 DIAGNOSIS — E119 Type 2 diabetes mellitus without complications: Secondary | ICD-10-CM | POA: Diagnosis not present

## 2023-07-27 DIAGNOSIS — F17210 Nicotine dependence, cigarettes, uncomplicated: Secondary | ICD-10-CM | POA: Insufficient documentation

## 2023-07-27 DIAGNOSIS — I10 Essential (primary) hypertension: Secondary | ICD-10-CM | POA: Insufficient documentation

## 2023-07-27 DIAGNOSIS — J9811 Atelectasis: Secondary | ICD-10-CM | POA: Diagnosis not present

## 2023-07-27 DIAGNOSIS — I7123 Aneurysm of the descending thoracic aorta, without rupture: Secondary | ICD-10-CM | POA: Diagnosis not present

## 2023-07-27 DIAGNOSIS — I513 Intracardiac thrombosis, not elsewhere classified: Secondary | ICD-10-CM | POA: Diagnosis present

## 2023-07-27 DIAGNOSIS — Z20822 Contact with and (suspected) exposure to covid-19: Secondary | ICD-10-CM | POA: Diagnosis not present

## 2023-07-27 DIAGNOSIS — J441 Chronic obstructive pulmonary disease with (acute) exacerbation: Secondary | ICD-10-CM | POA: Diagnosis not present

## 2023-07-27 DIAGNOSIS — J9621 Acute and chronic respiratory failure with hypoxia: Secondary | ICD-10-CM | POA: Diagnosis present

## 2023-07-27 DIAGNOSIS — Z7982 Long term (current) use of aspirin: Secondary | ICD-10-CM | POA: Insufficient documentation

## 2023-07-27 DIAGNOSIS — J9601 Acute respiratory failure with hypoxia: Secondary | ICD-10-CM | POA: Insufficient documentation

## 2023-07-27 DIAGNOSIS — R0602 Shortness of breath: Secondary | ICD-10-CM | POA: Diagnosis not present

## 2023-07-27 DIAGNOSIS — Z720 Tobacco use: Secondary | ICD-10-CM | POA: Diagnosis present

## 2023-07-27 DIAGNOSIS — Z79899 Other long term (current) drug therapy: Secondary | ICD-10-CM | POA: Insufficient documentation

## 2023-07-27 DIAGNOSIS — I739 Peripheral vascular disease, unspecified: Secondary | ICD-10-CM | POA: Diagnosis not present

## 2023-07-27 DIAGNOSIS — R198 Other specified symptoms and signs involving the digestive system and abdomen: Secondary | ICD-10-CM | POA: Diagnosis present

## 2023-07-27 DIAGNOSIS — I2510 Atherosclerotic heart disease of native coronary artery without angina pectoris: Secondary | ICD-10-CM | POA: Diagnosis not present

## 2023-07-27 DIAGNOSIS — R918 Other nonspecific abnormal finding of lung field: Secondary | ICD-10-CM | POA: Diagnosis not present

## 2023-07-27 DIAGNOSIS — Z7901 Long term (current) use of anticoagulants: Secondary | ICD-10-CM | POA: Diagnosis not present

## 2023-07-27 DIAGNOSIS — J439 Emphysema, unspecified: Secondary | ICD-10-CM | POA: Diagnosis not present

## 2023-07-27 DIAGNOSIS — R0902 Hypoxemia: Secondary | ICD-10-CM | POA: Diagnosis not present

## 2023-07-27 DIAGNOSIS — R059 Cough, unspecified: Secondary | ICD-10-CM | POA: Diagnosis not present

## 2023-07-27 LAB — COMPREHENSIVE METABOLIC PANEL
ALT: 13 U/L (ref 0–44)
AST: 17 U/L (ref 15–41)
Albumin: 4.1 g/dL (ref 3.5–5.0)
Alkaline Phosphatase: 61 U/L (ref 38–126)
Anion gap: 7 (ref 5–15)
BUN: 18 mg/dL (ref 8–23)
CO2: 28 mmol/L (ref 22–32)
Calcium: 9.1 mg/dL (ref 8.9–10.3)
Chloride: 100 mmol/L (ref 98–111)
Creatinine, Ser: 1.01 mg/dL (ref 0.61–1.24)
GFR, Estimated: >60 mL/min (ref >60)
Glucose, Bld: 146 mg/dL — ABNORMAL HIGH (ref 70–99)
Potassium: 3.9 mmol/L (ref 3.5–5.1)
Sodium: 135 mmol/L (ref 135–145)
Total Bilirubin: 0.6 mg/dL (ref 0.0–1.2)
Total Protein: 7.7 g/dL (ref 6.5–8.1)

## 2023-07-27 LAB — CBC
HCT: 39.7 % (ref 39.0–52.0)
Hemoglobin: 12.6 g/dL — ABNORMAL LOW (ref 13.0–17.0)
MCH: 28.8 pg (ref 26.0–34.0)
MCHC: 31.7 g/dL (ref 30.0–36.0)
MCV: 90.8 fL (ref 80.0–100.0)
Platelets: 261 10*3/uL (ref 150–400)
RBC: 4.37 MIL/uL (ref 4.22–5.81)
RDW: 13.5 % (ref 11.5–15.5)
WBC: 8.4 10*3/uL (ref 4.0–10.5)
nRBC: 0 % (ref 0.0–0.2)

## 2023-07-27 LAB — BRAIN NATRIURETIC PEPTIDE: B Natriuretic Peptide: 278 pg/mL — ABNORMAL HIGH (ref 0.0–100.0)

## 2023-07-27 LAB — PROTIME-INR
INR: 1.7 — ABNORMAL HIGH (ref 0.8–1.2)
Prothrombin Time: 19.8 s — ABNORMAL HIGH (ref 11.4–15.2)

## 2023-07-27 LAB — RESP PANEL BY RT-PCR (RSV, FLU A&B, COVID)  RVPGX2
Influenza A by PCR: NEGATIVE
Influenza B by PCR: NEGATIVE
Resp Syncytial Virus by PCR: NEGATIVE
SARS Coronavirus 2 by RT PCR: NEGATIVE

## 2023-07-27 LAB — TROPONIN I (HIGH SENSITIVITY)
Troponin I (High Sensitivity): 9 ng/L (ref <18)
Troponin I (High Sensitivity): 8 ng/L (ref <18)

## 2023-07-27 MED ORDER — ORAL CARE MOUTH RINSE: 15.0000 mL | OROMUCOSAL | Status: DC | PRN

## 2023-07-27 MED ORDER — IPRATROPIUM-ALBUTEROL 0.5-2.5 (3) MG/3ML IN SOLN
3.0000 mL | Freq: Once | RESPIRATORY_TRACT | Status: AC
  Administered 2023-07-27: 3 mL via RESPIRATORY_TRACT
  Filled 2023-07-27: qty 3

## 2023-07-27 MED ORDER — ASPIRIN 81 MG PO TBEC
81.0000 mg | DELAYED_RELEASE_TABLET | Freq: Every day | ORAL | Status: DC
  Administered 2023-07-27 – 2023-07-28 (×2): 81 mg via ORAL
  Filled 2023-07-27 (×2): qty 1

## 2023-07-27 MED ORDER — ALBUTEROL SULFATE HFA 108 (90 BASE) MCG/ACT IN AERS: 2.0000 | INHALATION_SPRAY | RESPIRATORY_TRACT | Status: DC | PRN

## 2023-07-27 MED ORDER — SODIUM CHLORIDE 0.9% FLUSH
3.0000 mL | Freq: Two times a day (BID) | INTRAVENOUS | Status: DC
  Administered 2023-07-27 – 2023-07-28 (×2): 3 mL via INTRAVENOUS

## 2023-07-27 MED ORDER — AMLODIPINE BESYLATE 5 MG PO TABS
5.0000 mg | ORAL_TABLET | Freq: Every day | ORAL | Status: DC
  Administered 2023-07-27 – 2023-07-28 (×2): 5 mg via ORAL
  Filled 2023-07-27 (×2): qty 1

## 2023-07-27 MED ORDER — ACETAMINOPHEN 650 MG RE SUPP: 650.0000 mg | Freq: Four times a day (QID) | RECTAL | Status: DC | PRN

## 2023-07-27 MED ORDER — RANOLAZINE ER 500 MG PO TB12: 1000.0000 mg | ORAL_TABLET | Freq: Two times a day (BID) | ORAL | Status: DC

## 2023-07-27 MED ORDER — METHYLPREDNISOLONE SODIUM SUCC 40 MG IJ SOLR
40.0000 mg | Freq: Two times a day (BID) | INTRAMUSCULAR | Status: DC
  Administered 2023-07-27 – 2023-07-28 (×3): 40 mg via INTRAVENOUS
  Filled 2023-07-27 (×3): qty 1

## 2023-07-27 MED ORDER — ALBUTEROL SULFATE (2.5 MG/3ML) 0.083% IN NEBU: 2.5000 mg | INHALATION_SOLUTION | RESPIRATORY_TRACT | Status: DC | PRN

## 2023-07-27 MED ORDER — POLYVINYL ALCOHOL 1.4 % OP SOLN: 1.0000 [drp] | OPHTHALMIC | Status: DC | PRN

## 2023-07-27 MED ORDER — TRAZODONE HCL 50 MG PO TABS: 50.0000 mg | ORAL_TABLET | Freq: Every evening | ORAL | Status: DC | PRN

## 2023-07-27 MED ORDER — CARVEDILOL 3.125 MG PO TABS
3.1250 mg | ORAL_TABLET | Freq: Two times a day (BID) | ORAL | Status: DC
  Administered 2023-07-27 – 2023-07-28 (×3): 3.125 mg via ORAL
  Filled 2023-07-27 (×3): qty 1

## 2023-07-27 MED ORDER — DM-GUAIFENESIN ER 30-600 MG PO TB12
1.0000 | ORAL_TABLET | Freq: Two times a day (BID) | ORAL | Status: DC
  Administered 2023-07-27 – 2023-07-28 (×3): 1 via ORAL
  Filled 2023-07-27 (×3): qty 1

## 2023-07-27 MED ORDER — RANOLAZINE ER 500 MG PO TB12
1000.0000 mg | ORAL_TABLET | Freq: Two times a day (BID) | ORAL | Status: DC
  Administered 2023-07-27 – 2023-07-28 (×3): 1000 mg via ORAL
  Filled 2023-07-27 (×3): qty 2

## 2023-07-27 MED ORDER — LISINOPRIL 5 MG PO TABS
2.5000 mg | ORAL_TABLET | Freq: Every day | ORAL | Status: DC
  Administered 2023-07-27 – 2023-07-28 (×2): 2.5 mg via ORAL
  Filled 2023-07-27 (×2): qty 1

## 2023-07-27 MED ORDER — IPRATROPIUM-ALBUTEROL 0.5-2.5 (3) MG/3ML IN SOLN
3.0000 mL | Freq: Four times a day (QID) | RESPIRATORY_TRACT | Status: DC
  Administered 2023-07-27 – 2023-07-28 (×5): 3 mL via RESPIRATORY_TRACT
  Filled 2023-07-27 (×5): qty 3

## 2023-07-27 MED ORDER — HYDRALAZINE HCL 20 MG/ML IJ SOLN
10.0000 mg | Freq: Four times a day (QID) | INTRAMUSCULAR | Status: DC | PRN
  Filled 2023-07-27: qty 1

## 2023-07-27 MED ORDER — POLYETHYLENE GLYCOL 400 0.25 % OP SOLN: Freq: Every day | OPHTHALMIC | Status: DC | PRN

## 2023-07-27 MED ORDER — WARFARIN - PHARMACIST DOSING INPATIENT: Freq: Every day | Status: DC

## 2023-07-27 MED ORDER — ACETAMINOPHEN 325 MG PO TABS
650.0000 mg | ORAL_TABLET | Freq: Four times a day (QID) | ORAL | Status: DC | PRN
Start: 2023-07-27 — End: 2023-07-28

## 2023-07-27 MED ORDER — POLYETHYLENE GLYCOL 3350 17 G PO PACK: 17.0000 g | PACK | Freq: Every day | ORAL | Status: DC | PRN

## 2023-07-27 MED ORDER — SODIUM CHLORIDE 0.9% FLUSH
3.0000 mL | Freq: Two times a day (BID) | INTRAVENOUS | Status: DC
  Administered 2023-07-27 – 2023-07-28 (×3): 3 mL via INTRAVENOUS

## 2023-07-27 MED ORDER — METHYLPREDNISOLONE SODIUM SUCC 125 MG IJ SOLR
125.0000 mg | Freq: Once | INTRAMUSCULAR | Status: AC
  Administered 2023-07-27: 125 mg via INTRAVENOUS
  Filled 2023-07-27: qty 2

## 2023-07-27 MED ORDER — BISACODYL 10 MG RE SUPP: 10.0000 mg | Freq: Every day | RECTAL | Status: DC | PRN

## 2023-07-27 MED ORDER — ROSUVASTATIN CALCIUM 20 MG PO TABS
40.0000 mg | ORAL_TABLET | Freq: Every day | ORAL | Status: DC
  Administered 2023-07-27: 40 mg via ORAL
  Filled 2023-07-27: qty 2

## 2023-07-27 MED ORDER — SODIUM CHLORIDE 0.9 % IV SOLN: INTRAVENOUS | Status: AC | PRN

## 2023-07-27 MED ORDER — DOXYCYCLINE HYCLATE 100 MG PO TABS
100.0000 mg | ORAL_TABLET | Freq: Two times a day (BID) | ORAL | Status: DC
  Administered 2023-07-27 – 2023-07-28 (×2): 100 mg via ORAL
  Filled 2023-07-27 (×2): qty 1

## 2023-07-27 MED ORDER — SODIUM CHLORIDE 0.9% FLUSH: 3.0000 mL | INTRAVENOUS | Status: DC | PRN

## 2023-07-27 MED ORDER — PANTOPRAZOLE SODIUM 40 MG PO TBEC
40.0000 mg | DELAYED_RELEASE_TABLET | Freq: Two times a day (BID) | ORAL | Status: DC
  Administered 2023-07-27 – 2023-07-28 (×3): 40 mg via ORAL
  Filled 2023-07-27 (×3): qty 1

## 2023-07-27 MED ORDER — IOHEXOL 350 MG/ML SOLN
75.0000 mL | Freq: Once | INTRAVENOUS | Status: AC | PRN
  Administered 2023-07-27: 75 mL via INTRAVENOUS

## 2023-07-27 MED ORDER — WARFARIN SODIUM 2 MG PO TABS
4.0000 mg | ORAL_TABLET | Freq: Once | ORAL | Status: AC
  Administered 2023-07-27: 4 mg via ORAL
  Filled 2023-07-27: qty 1
  Filled 2023-07-27: qty 2

## 2023-07-27 MED ORDER — NITROGLYCERIN 0.4 MG SL SUBL
0.4000 mg | SUBLINGUAL_TABLET | Freq: Once | SUBLINGUAL | Status: AC
  Administered 2023-07-27: 0.4 mg via SUBLINGUAL
  Filled 2023-07-27: qty 1

## 2023-07-27 NOTE — ED Triage Notes (Signed)
 Pov from home. Cc of SOB thinks he has pneumonia form COVID. Denies any sick contacts. Been sick since yesterday.  Cough, congestion, headache.  Uses home oxygen  as needed and was on 2.5l [place] baseline sats are 94-95%  82% on room air.  Placed on 3l.  Up to 91%

## 2023-07-27 NOTE — Progress Notes (Addendum)
 PHARMACY - ANTICOAGULATION CONSULT NOTE  Pharmacy Consult for warfarin Indication:  history of LV thrombus  No Known Allergies  Patient Measurements: Height: 6' (182.9 cm) Weight: 93.4 kg (206 lb) IBW/kg (Calculated) : 77.6  Vital Signs: Temp: 98.4 F (36.9 C) (02/06 0754) Temp Source: Oral (02/06 0754) BP: 153/89 (02/06 0600) Pulse Rate: 81 (02/06 0913)  Labs: Recent Labs    07/27/23 0329 07/27/23 0507  HGB 12.6*  --   HCT 39.7  --   PLT 261  --   CREATININE 1.01  --   TROPONINIHS 9 8    Estimated Creatinine Clearance: 80.8 mL/min (by C-G formula based on SCr of 1.01 mg/dL).   Medical History: Past Medical History:  Diagnosis Date   Ampullary adenoma    Arthritis    Reported by patient   Cancer [facility])    Reported by patient   Claustrophobia    Closed fracture of right distal tibia    COPD (chronic obstructive pulmonary disease) (HCC)    Reported by patient   Coronary artery disease    a. 1999 Inf MI;  b. 2003 CABGx6;  c. 2008 NSTEMI, occluded SVG to marginal and diagonal system. 2 DES placed to marginal system;  c. NSTEMI - 02/2012 Xience Xpedition DES to oS-OM and POBA to mid lesion;  c. 10/2013 NSTEMI/PCI: VG->OM 80 ISR, native OM 80/90 small, LIMA->LAD ok w/ 90 dLAD, D1 80, RIMA->RPL ok w/ 95 in RPL (2.5x14 Resolute DES), nl EF.   Dyspnea    Reported by patient   GERD (gastroesophageal reflux disease)    Hyperlipidemia    Hypertension    Myocardial infarction (HCC)    X 6   Pneumonia    S/P AAA repair    Tobacco abuse    Type 2 diabetes mellitus without complication, without long-term current use of insulin  (HCC)    Wears dentures    Wears glasses     Assessment: 73 year old male on warfarin for history of LV thrombus.   INR low at 1.7 on admit. Hemoglobin 12.6. No bleeding issues noted.   Warfarin regimen prior to admit is 2mg  every day x 1mg  on Fridays   Goal of Therapy:  INR 2-3 Monitor platelets by anticoagulation protocol: Yes   Plan:   4 mg today of warfarin Daily INR   Dempsey Blush PharmD., BCPS Clinical Pharmacist 07/27/2023 10:55 AM

## 2023-07-27 NOTE — ED Provider Notes (Signed)
 [facility] [place] [facility] Provider Note MRN:  [id number]  Arrival date & time: 07/27/23     Chief Complaint   Shortness of Breath   History of Present Illness   Clifford Jones is a 73 y.o. year-old male with a history of COPD, CAD presenting to the ED with chief complaint of shortness of breath.  Worsening shortness of breath over the past 2 days.  Persistent cough.  Denies fever, no chest pain, no leg pain or swelling.  Review of Systems  A thorough review of systems was obtained and all systems are negative except as noted in the HPI and PMH.   Patient's Health History    Past Medical History:  Diagnosis Date   Ampullary adenoma    Arthritis    Reported by patient   Cancer [facility])    Reported by patient   Claustrophobia    Closed fracture of right distal tibia    COPD (chronic obstructive pulmonary disease) (HCC)    Reported by patient   Coronary artery disease    a. 1999 Inf MI;  b. 2003 CABGx6;  c. 2008 NSTEMI, occluded SVG to marginal and diagonal system. 2 DES placed to marginal system;  c. NSTEMI - 02/2012 Xience Xpedition DES to oS-OM and POBA to mid lesion;  c. 10/2013 NSTEMI/PCI: VG->OM 80 ISR, native OM 80/90 small, LIMA->LAD ok w/ 90 dLAD, D1 80, RIMA->RPL ok w/ 95 in RPL (2.5x14 Resolute DES), nl EF.   Dyspnea    Reported by patient   GERD (gastroesophageal reflux disease)    Hyperlipidemia    Hypertension    Myocardial infarction (HCC)    X 6   Pneumonia    S/P AAA repair    Tobacco abuse    Type 2 diabetes mellitus without complication, without long-term current use of insulin  (HCC)    Wears dentures    Wears glasses     Past Surgical History:  Procedure Laterality Date   ABDOMINAL AORTIC ANEURYSM REPAIR  2006   APPENDECTOMY     BIOPSY  03/08/2021   Procedure: BIOPSY;  Surgeon: Wilhelmenia Aloha Raddle., MD;  Location: [facility];  Service: Gastroenterology;;   CATARACT EXTRACTION W/PHACO Right 11/05/2021   Procedure:  CATARACT EXTRACTION PHACO AND INTRAOCULAR LENS PLACEMENT (IOC);  Surgeon: Harrie Lynwood, MD;  Location: [facility];  Service: Ophthalmology;  Laterality: Right;  CDE: 11.33   COLONOSCOPY WITH PROPOFOL  N/A 11/28/2022   Procedure: COLONOSCOPY WITH PROPOFOL ;  Surgeon: Wilhelmenia Aloha Raddle., MD;  Location: [facility];  Service: Gastroenterology;  Laterality: N/A;   COLONOSCOPY WITH PROPOFOL  N/A 05/08/2023   Procedure: COLONOSCOPY WITH PROPOFOL ;  Surgeon: Wilhelmenia Aloha Raddle., MD;  Location: [facility];  Service: Gastroenterology;  Laterality: N/A;   CORONARY ANGIOPLASTY WITH STENT PLACEMENT     CORONARY ARTERY BYPASS GRAFT  2003   ENDOSCOPIC MUCOSAL RESECTION  11/28/2022   Procedure: ENDOSCOPIC MUCOSAL RESECTION;  Surgeon: Wilhelmenia Aloha Raddle., MD;  Location: [facility];  Service: Gastroenterology;;   ENDOSCOPIC MUCOSAL RESECTION N/A 05/08/2023   Procedure: ENDOSCOPIC MUCOSAL RESECTION;  Surgeon: Wilhelmenia Aloha Raddle., MD;  Location: [facility];  Service: Gastroenterology;  Laterality: N/A;   ENDOSCOPIC RETROGRADE CHOLANGIOPANCREATOGRAPHY (ERCP) WITH PROPOFOL  N/A 03/25/2019   Procedure: ENDOSCOPIC RETROGRADE CHOLANGIOPANCREATOGRAPHY (ERCP) WITH PROPOFOL ;  Surgeon: Wilhelmenia Aloha Raddle., MD;  Location[facility]COPY;  Service: Gastroenterology;  Laterality: N/A;   ENDOSCOPIC RETROGRADE CHOLANGIOPANCREATOGRAPHY (ERCP) WITH PROPOFOL  N/A 03/08/2021   Procedure: ENDOSCOPIC RETROGRADE CHOLANGIOPANCREATOGRAPHY (ERCP) WITH PROPOFOL ;  Surgeon: Wilhelmenia Aloha Raddle., MD;  [facility]: [facility];  Service: Gastroenterology;  Laterality: N/A;   ENDOSCOPIC RETROGRADE CHOLANGIOPANCREATOGRAPHY (ERCP) WITH PROPOFOL  N/A 04/14/2022   Procedure: ENDOSCOPIC RETROGRADE CHOLANGIOPANCREATOGRAPHY (ERCP) WITH PROPOFOL ;  Surgeon: Wilhelmenia Aloha Raddle., MD;  [facility];  Service: Gastroenterology;  Laterality: N/A;   ESOPHAGEAL DILATION  07/06/2018   Procedure: ESOPHAGEAL DILATION;  Surgeon: Golda Claudis PENNER, MD;  [facility];  Service: Endoscopy;;   ESOPHAGOGASTRODUODENOSCOPY N/A 07/06/2018   Procedure: ESOPHAGOGASTRODUODENOSCOPY (EGD);  Surgeon: Golda Claudis PENNER, MD;  [facility];  Service: Endoscopy;  Laterality: N/A;   ESOPHAGOGASTRODUODENOSCOPY N/A 08/16/2018   Procedure: ESOPHAGOGASTRODUODENOSCOPY (EGD);  Surgeon: Teressa Toribio SQUIBB, MD;  Location: [facility];  Service: Endoscopy;  Laterality: N/A;   ESOPHAGOGASTRODUODENOSCOPY (EGD) WITH PROPOFOL  N/A 03/25/2019   Procedure: ESOPHAGOGASTRODUODENOSCOPY (EGD) WITH PROPOFOL ;  Surgeon: Wilhelmenia Aloha Raddle., MD;  Location: [facility] END[facility]COPY;  Service: Gastroenterology;  Laterality: N/A;   EUS N/A 08/16/2018   Procedure: UPPER ENDOSCOPIC ULTRASOUND (EUS) RADIAL;  Surgeon: Teressa Toribio SQUIBB, MD;  Location: [facility];  Service: Endoscopy;  Laterality: N/A;   EUS N/A 03/25/2019   Procedure: UPPER ENDOSCOPIC ULTRASOUND (EUS) RADIAL;  Surgeon: Wilhelmenia Aloha Raddle., MD;  Location: [facility] ENDOSCOPY;  Service: Gastroenterology;  Laterality: N/A;   FOREIGN BODY REMOVAL  07/06/2018   Procedure: FOREIGN BODY REMOVAL;  Surgeon: Golda Claudis PENNER, MD;  [facility];  Service: Endoscopy;;   HEMOSTASIS CLIP PLACEMENT  11/28/2022   Procedure: HEMOSTASIS CLIP PLACEMENT;  Surgeon: Wilhelmenia Aloha Raddle., MD;  Location: [facility];  Service: Gastroenterology;;   HEMOSTASIS CLIP PLACEMENT  05/08/2023   Procedure: HEMOSTASIS CLIP PLACEMENT;  Surgeon: Wilhelmenia Aloha Raddle., MD;  Location: [facility];  Service: Gastroenterology;;   HERNIA REPAIR     umbicical   LEFT HEART CATHETERIZATION WITH CORONARY ANGIOGRAM N/A 03/05/2012   Procedure: LEFT HEART CATHETERIZATION WITH CORONARY ANGIOGRAM;  Surgeon: Debby JONETTA Como, MD;  Location: [facility] CATH LAB;  Service: Cardiovascular;  Laterality: N/A;   LEFT HEART CATHETERIZATION WITH CORONARY ANGIOGRAM N/A 10/23/2013   Procedure: LEFT HEART CATHETERIZATION WITH CORONARY ANGIOGRAM;  Surgeon:  Ozell JONETTA Fell, MD;  Location: [facility] CATH LAB;  Service: Cardiovascular;  Laterality: N/A;   LEFT HEART CATHETERIZATION WITH CORONARY/GRAFT ANGIOGRAM  10/22/2013   Procedure: LEFT HEART CATHETERIZATION WITH EL BILE;  Surgeon: Alm LELON Clay, MD;  Location: [facility] CATH LAB;  Service: Cardiovascular;;   PANCREATIC STENT PLACEMENT  03/25/2019   Procedure: PANCREATIC STENT PLACEMENT;  Surgeon: Wilhelmenia Aloha Raddle., MD;  Location: [facility] ENDOSCOPY;  Service: Gastroenterology;;   PANCREATIC STENT PLACEMENT  03/08/2021   Procedure: PANCREATIC STENT PLACEMENT;  Surgeon: Wilhelmenia Aloha Raddle., MD;  Location: [facility];  Service: Gastroenterology;;   PANCREATIC STENT PLACEMENT  04/14/2022   Procedure: PANCREATIC STENT PLACEMENT;  Surgeon: Wilhelmenia Aloha Raddle., MD;  Location: [facility];  Service: Gastroenterology;;   PERCUTANEOUS CORONARY STENT INTERVENTION (PCI-S) N/A 03/07/2012   Procedure: PERCUTANEOUS CORONARY STENT INTERVENTION (PCI-S);  Surgeon: Ozell Fell, MD;  Location: [facility] CATH LAB;  Service: Cardiovascular;  Laterality: N/A;   PERCUTANEOUS CORONARY STENT INTERVENTION (PCI-S) Right 10/23/2013   Procedure: PERCUTANEOUS CORONARY STENT INTERVENTION (PCI-S);  Surgeon: Ozell JONETTA Fell, MD;  Location: [facility] CATH LAB;  Service: Cardiovascular;  Laterality: Right;   POLYPECTOMY  07/06/2018   Procedure: POLYPECTOMY;  Surgeon: Golda Claudis PENNER, MD;  [facility];  Service: Endoscopy;;  polyp at ampulla   POLYPECTOMY  03/25/2019   Procedure: AMPULLECTOMY;  Surgeon: Wilhelmenia Aloha Raddle., MD;  Location: [facility] ENDOSCOPY;  Service: Gastroenterology;;  POLYPECTOMY  11/28/2022   Procedure: POLYPECTOMY;  Surgeon: Mansouraty, Aloha Raddle., MD;  Location: [facility];  Service: Gastroenterology;;   POLYPECTOMY  05/08/2023   Procedure: POLYPECTOMY;  Surgeon: Wilhelmenia Aloha Raddle., MD;  Location: [facility];  Service: Gastroenterology;;   REMOVAL OF STONES  03/08/2021   Procedure: REMOVAL  OF STONES;  Surgeon: Wilhelmenia Aloha Raddle., MD;  Location: [facility];  Service: Gastroenterology;;   Right hand surgery     RIGHT HEART CATH AND CORONARY/GRAFT ANGIOGRAPHY N/A 12/16/2019   Procedure: RIGHT HEART CATH AND CORONARY/GRAFT ANGIOGRAPHY;  Surgeon: Mady Bruckner, MD;  Location: [facility]VAS[facility] CV LAB;  Service: Cardiovascular;  Laterality: N/A;   SPHINCTEROTOMY  03/25/2019   Procedure: SPHINCTEROTOMY;  Surgeon: Mansouraty, Aloha Raddle., MD;  Location: [facility] ENDOSCOPY;  Service: Gastroenterology;;   SUBMUCOSAL LIFTING INJECTION  11/28/2022   Procedure: SUBMUCOSAL LIFTING INJECTION;  Surgeon: Wilhelmenia Aloha Raddle., MD;  Location: [facility];  Service: Gastroenterology;;   SUBMUCOSAL LIFTING INJECTION  05/08/2023   Procedure: SUBMUCOSAL LIFTING INJECTION;  Surgeon: Wilhelmenia Aloha Raddle., MD;  Location: [facility];  Service: Gastroenterology;;   SUBMUCOSAL TATTOO INJECTION  11/28/2022   Procedure: SUBMUCOSAL TATTOO INJECTION;  Surgeon: Wilhelmenia Aloha Raddle., MD;  Location: [facility];  Service: Gastroenterology;;   TIBIA IM NAIL INSERTION Right 04/07/2016   Procedure: FIXATION OF RIGHT TIBIA FRACTURE;  Surgeon: Maude LELON Right, MD;  Location[facility] OR;  Service: Orthopedics;  Laterality: Right;    Family History  Problem Relation Age of Onset   Coronary artery disease Other    Colon cancer Neg Hx    Esophageal cancer Neg Hx    Inflammatory bowel disease Neg Hx    Liver disease Neg Hx    Pancreatic cancer Neg Hx    Rectal cancer Neg Hx    Stomach cancer Neg Hx     Social History   Socioeconomic History   Marital status: Legally Separated    Spouse name: Not on file   Number of children: Not on file   Years of education: Not on file   Highest education level: Not on file  Occupational History   Not on file  Tobacco Use   Smoking status: Some Days    Current packs/day: 0.00    Average packs/day: 1 pack/day for 56.0 years (56.0 ttl pk-yrs)    Types: Cigarettes     Start date: 10/04/1964    Last attempt to quit: 10/2020    Years since quitting: 2.7   Smokeless tobacco: Never   Tobacco comments:    since November 2020-has smoked a total of 6 packs    08/11/2021 Per patient Smoked 3 Packs of cigarettes in the past 8 months  Vaping Use   Vaping status: Never Used  Substance and Sexual Activity   Alcohol  use: Yes    Comment: Maybe 2 beers a month and 2 shotsa month   Drug use: No   Sexual activity: Yes  Other Topics Concern   Not on file  Social History Narrative   Disabled.  Former truck software engineer. Divorced and remarried x 2         Social Drivers of Corporate Investment Banker Strain: Not on file  Food Insecurity: Not on file  Transportation Needs: Not on file  Physical Activity: Not on file  Stress: Not on file  Social Connections: Not on file  Intimate Partner Violence: Not on file     Physical Exam   Vitals:   07/27/23 9688 07/27/23 9543  BP: (!) 162/76   Pulse: 74   Resp: 19   Temp: 98.3 F (36.8 C)   SpO2: 93% 94%    CONSTITUTIONAL: Chronically ill-appearing, NAD NEURO/PSYCH:  Alert and oriented x 3, no focal deficits EYES:  eyes equal and reactive ENT/NECK:  no LAD, no JVD CARDIO: Regular rate, well-perfused, normal S1 and S2 PULM: Poor air movement GI/GU:  non-distended, non-tender MSK/SPINE:  No gross deformities, no edema SKIN:  no rash, atraumatic   *Additional and/or pertinent findings included in MDM below  Diagnostic and Interventional Summary    EKG Interpretation Date/Time:  Thursday July 27 2023 03:18:33 EST Ventricular Rate:  75 PR Interval:  190 QRS Duration:  108 QT Interval:  440 QTC Calculation: 492 R Axis:   45  Text Interpretation: Sinus rhythm Incomplete left bundle branch block Low voltage, precordial leads Borderline prolonged QT interval Confirmed by Theadore Sharper [phone]) on 07/27/2023 4:36:23 AM       Labs Reviewed  CBC - Abnormal; Notable for the following  components:      Result Value   Hemoglobin 12.6 (*)    All other components within normal limits  COMPREHENSIVE METABOLIC PANEL - Abnormal; Notable for the following components:   Glucose, Bld 146 (*)    All other components within normal limits  BRAIN NATRIURETIC PEPTIDE - Abnormal; Notable for the following components:   B Natriuretic Peptide 278.0 (*)    All other components within normal limits  RESP PANEL BY RT-PCR (RSV, FLU A&B, COVID)  RVPGX2  TROPONIN I (HIGH SENSITIVITY)  TROPONIN I (HIGH SENSITIVITY)    CT Angio Chest Pulmonary Embolism (PE) W or WO Contrast  Final Result    DG Chest 2 View  Final Result      Medications  albuterol  (VENTOLIN  HFA) 108 (90 Base) MCG/ACT inhaler 2 puff (has no administration in time range)  ipratropium-albuterol  (DUONEB) 0.5-2.5 (3) MG/3ML nebulizer solution 3 mL (3 mLs Nebulization Given 07/27/23 0456)  methylPREDNISolone  sodium succinate (SOLU-MEDROL ) 125 mg/2 mL injection 125 mg (125 mg Intravenous Given 07/27/23 0340)  iohexol  (OMNIPAQUE ) 350 MG/ML injection 75 mL (75 mLs Intravenous Contrast Given 07/27/23 0511)     Procedures  /  Critical Care .Critical Care  Performed by: Theadore Sharper HERO, MD Authorized by: Theadore Sharper HERO, MD   Critical care provider statement:    Critical care time (minutes):  35   Critical care was necessary to treat or prevent imminent or life-threatening deterioration of the following conditions:  Respiratory failure   Critical care was time spent personally by me on the following activities:  Development of treatment plan with patient or surrogate, discussions with consultants, evaluation of patient's response to treatment, examination of patient, ordering and review of laboratory studies, ordering and review of radiographic studies, ordering and performing treatments and interventions, pulse oximetry, re-evaluation of patient's condition and review of old charts   ED Course and Medical Decision Making  Initial  Impression and Ddx Differential diagnosis includes CHF exacerbation, COPD exacerbation, ACS, PE, pneumonia, viral illness.  Patient requiring 4-1/2 L nasal cannula to maintain saturations at this time, at home has oxygen  available but does not need it every day.  Past medical/surgical history that increases complexity of ED encounter: Chronic bronchitis, CAD  Interpretation of Diagnostics I personally reviewed the EKG and my interpretation is as follows: Sinus rhythm  Labs reveal mildly elevated BNP, otherwise no significant blood count or electrolyte disturbance.  Patient Reassessment and Ultimate Disposition/Management     With  no obvious explanation for patient's shortness of breath with no hypoxia CT PE study was obtained which is largely unremarkable, suspect COPD exacerbation, admitted to medicine.  Patient management required discussion with the following services or consulting groups:  Hospitalist Service  Complexity of Problems Addressed Acute illness or injury that poses threat of life of bodily function  Additional Data Reviewed and Analyzed Further history obtained from: Further history from spouse/family member  Additional Factors Impacting ED Encounter Risk Consideration of hospitalization  Ozell HERO. Theadore, MD [facility] Emergency Medicine [facility] [email or web address]  Final Clinical Impressions(s) / ED Diagnoses     ICD-10-CM   1. COPD exacerbation (HCC)  J44.1     2. Acute respiratory failure with hypoxia (HCC)  J96.01       ED Discharge Orders     None        Discharge Instructions Discussed with and Provided to Patient:   Discharge Instructions   None      Theadore Ozell HERO, MD 07/27/23 [phone]

## 2023-07-27 NOTE — H&P (Addendum)
 History and Physical    Patient: Clifford Jones FMW:[id number] DOB: [birth year] DOA: 07/27/2023 DOS: the patient was seen and examined on 07/27/2023 PCP: [name], PA  Patient coming from: Home  Chief Complaint:  Chief Complaint  Patient presents with   Shortness of Breath   HPI: Clifford Jones is a 73 y.o. male with medical history significant for COPD, CAD, status post prior CABG, status post prior angioplasty and stenting,  HTN, PAD/status post AAA repair, DM2, and history of GERD who presents to the ED with cough, headache, congestion and dyspnea for less than 48 hours -In the ED patient is found to have O2 sats of 82% on room air he was placed on 2 L of oxygen  in the ED and O2 sats came up to 91% -Denies flank chest pains or pleuritic symptoms No fever  Or chills  - reports fatigue and malaise  -CTA chest without PE, severe thoracic aortic atherosclerosis noted, mild emphysema noted, with findings of bronchitis, no discrete pneumonia -COVID, RSV and influenza negative -Troponin is 9 , repeat is 8 -BNP 278 -LFTs WNL, creatinine 1.0, potassium 3.9 -WBC 8.4, platelets 261 hemoglobin 12.6  -Additional history obtained from patient's wife at bedside   Review of Systems: As mentioned in the history of present illness. All other systems reviewed and are negative. Past Medical History:  Diagnosis Date   Ampullary adenoma    Arthritis    Reported by patient   Cancer [facility])    Reported by patient   Claustrophobia    Closed fracture of right distal tibia    COPD (chronic obstructive pulmonary disease) (HCC)    Reported by patient   Coronary artery disease    a. 1999 Inf MI;  b. 2003 CABGx6;  c. 2008 NSTEMI, occluded SVG to marginal and diagonal system. 2 DES placed to marginal system;  c. NSTEMI - 02/2012 Xience Xpedition DES to oS-OM and POBA to mid lesion;  c. 10/2013 NSTEMI/PCI: VG->OM 80 ISR, native OM 80/90 small, LIMA->LAD ok w/ 90 dLAD, D1 80, RIMA->RPL ok w/ 95 in RPL  (2.5x14 Resolute DES), nl EF.   Dyspnea    Reported by patient   GERD (gastroesophageal reflux disease)    Hyperlipidemia    Hypertension    Myocardial infarction (HCC)    X 6   Pneumonia    S/P AAA repair    Tobacco abuse    Type 2 diabetes mellitus without complication, without long-term current use of insulin  (HCC)    Wears dentures    Wears glasses    Past Surgical History:  Procedure Laterality Date   ABDOMINAL AORTIC ANEURYSM REPAIR  2006   APPENDECTOMY     BIOPSY  03/08/2021   Procedure: BIOPSY;  Surgeon: Wilhelmenia Aloha Raddle., MD;  Location: [facility];  Service: Gastroenterology;;   CATARACT EXTRACTION W/PHACO Right 11/05/2021   Procedure: CATARACT EXTRACTION PHACO AND INTRAOCULAR LENS PLACEMENT (IOC);  Surgeon: Harrie Lynwood, MD;  Location: [facility];  Service: Ophthalmology;  Laterality: Right;  CDE: 11.33   COLONOSCOPY WITH PROPOFOL  N/A 11/28/2022   Procedure: COLONOSCOPY WITH PROPOFOL ;  Surgeon: Mansouraty, Aloha Raddle., MD;  Location: [facility];  Service: Gastroenterology;  Laterality: N/A;   COLONOSCOPY WITH PROPOFOL  N/A 05/08/2023   Procedure: COLONOSCOPY WITH PROPOFOL ;  Surgeon: Wilhelmenia Aloha Raddle., MD;  Location: [facility];  Service: Gastroenterology;  Laterality: N/A;   CORONARY ANGIOPLASTY WITH STENT PLACEMENT     CORONARY ARTERY BYPASS GRAFT  2003   ENDOSCOPIC MUCOSAL RESECTION  11/28/2022   Procedure: ENDOSCOPIC MUCOSAL RESECTION;  Surgeon: Wilhelmenia Aloha Raddle., MD;  Location: [facility];  Service: Gastroenterology;;   ENDOSCOPIC MUCOSAL RESECTION N/A 05/08/2023   Procedure: ENDOSCOPIC MUCOSAL RESECTION;  Surgeon: Wilhelmenia Aloha Raddle., MD;  Location: [facility];  Service: Gastroenterology;  Laterality: N/A;   ENDOSCOPIC RETROGRADE CHOLANGIOPANCREATOGRAPHY (ERCP) WITH PROPOFOL  N/A 03/25/2019   Procedure: ENDOSCOPIC RETROGRADE CHOLANGIOPANCREATOGRAPHY (ERCP) WITH PROPOFOL ;  Surgeon: Wilhelmenia Aloha Raddle., MD;  Location: [facility] ENDOSCOPY;  Service:  Gastroenterology;  Laterality: N/A;   ENDOSCOPIC RETROGRADE CHOLANGIOPANCREATOGRAPHY (ERCP) WITH PROPOFOL  N/A 03/08/2021   Procedure: ENDOSCOPIC RETROGRADE CHOLANGIOPANCREATOGRAPHY (ERCP) WITH PROPOFOL ;  Surgeon: Wilhelmenia Aloha Raddle., MD;  [facility];  Service: Gastroenterology;  Laterality: N/A;   ENDOSCOPIC RETROGRADE CHOLANGIOPANCREATOGRAPHY (ERCP) WITH PROPOFOL  N/A 04/14/2022   Procedure: ENDOSCOPIC RETROGRADE CHOLANGIOPANCREATOGRAPHY (ERCP) WITH PROPOFOL ;  Surgeon: Wilhelmenia Aloha Raddle., MD;  [facility];  Service: Gastroenterology;  Laterality: N/A;   ESOPHAGEAL DILATION  07/06/2018   Procedure: ESOPHAGEAL DILATION;  Surgeon: Golda Claudis PENNER, MD;  [facility];  Service: Endoscopy;;   ESOPHAGOGASTRODUODENOSCOPY N/A 07/06/2018   Procedure: ESOPHAGOGASTRODUODENOSCOPY (EGD);  Surgeon: Golda Claudis PENNER, MD;  [facility];  Service: Endoscopy;  Laterality: N/A;   ESOPHAGOGASTRODUODENOSCOPY N/A 08/16/2018   Procedure: ESOPHAGOGASTRODUODENOSCOPY (EGD);  Surgeon: Teressa Toribio SQUIBB, MD;  Location: [facility];  Service: Endoscopy;  Laterality: N/A;   ESOPHAGOGASTRODUODENOSCOPY (EGD) WITH PROPOFOL  N/A 03/25/2019   Procedure: ESOPHAGOGASTRODUODENOSCOPY (EGD) WITH PROPOFOL ;  Surgeon: Wilhelmenia Aloha Raddle., MD;  Location: [facility] ENDOSCOPY;  Service: Gastroenterology;  Laterality: N/A;   EUS N/A 08/16/2018   Procedure: UPPER ENDOSCOPIC ULTRASOUND (EUS) RADIAL;  Surgeon: Teressa Toribio SQUIBB, MD;  [facility];  Service: Endoscopy;  Laterality: N/A;   EUS N/A 03/25/2019   Procedure: UPPER ENDOSCOPIC ULTRASOUND (EUS) RADIAL;  Surgeon: Wilhelmenia Aloha Raddle., MD;  Location: [facility] ENDOSCOPY;  Service: Gastroenterology;  Laterality: N/A;   FOREIGN BODY REMOVAL  07/06/2018   Procedure: FOREIGN BODY REMOVAL;  Surgeon: Golda Claudis PENNER, MD;  [facility];  Service: Endoscopy;;   HEMOSTASIS CLIP PLACEMENT  11/28/2022   Procedure: HEMOSTASIS CLIP PLACEMENT;   Surgeon: Wilhelmenia Aloha Raddle., MD;  Location: [facility];  Service: Gastroenterology;;   HEMOSTASIS CLIP PLACEMENT  05/08/2023   Procedure: HEMOSTASIS CLIP PLACEMENT;  Surgeon: Wilhelmenia Aloha Raddle., MD;  Location: [facility];  Service: Gastroenterology;;   HERNIA REPAIR     umbicical   LEFT HEART CATHETERIZATION WITH CORONARY ANGIOGRAM N/A 03/05/2012   Procedure: LEFT HEART CATHETERIZATION WITH CORONARY ANGIOGRAM;  Surgeon: Debby JONETTA Como, MD;  Location: [facility] CATH LAB;  Service: Cardiovascular;  Laterality: N/A;   LEFT HEART CATHETERIZATION WITH CORONARY ANGIOGRAM N/A 10/23/2013   Procedure: LEFT HEART CATHETERIZATION WITH CORONARY ANGIOGRAM;  Surgeon: Ozell JONETTA Fell, MD;  Location: [facility] C[facility] LAB;  Service: Cardiovascular;  Laterality: N/A;   LEFT HEART CATHETERIZATION WITH CORONARY/GRAFT ANGIOGRAM  10/22/2013   Procedure: LEFT HEART CATHETERIZATION WITH EL BILE;  Surgeon: Alm LELON Clay, MD;  Location: [facility] CATH LAB;  Service: Cardiovascular;;   PANCREATIC STENT PLACEMENT  03/25/2019   Procedure: PANCREATIC STENT PLACEMENT;  Surgeon: Wilhelmenia Aloha Raddle., MD;  Location: [facility] ENDOSCOPY;  Service: Gastroenterology;;   PANCREATIC STENT PLACEMENT  03/08/2021   Procedure: PANCREATIC STENT PLACEMENT;  Surgeon: Wilhelmenia Aloha Raddle., MD;  Location: [facility];  Service: Gastroenterology;;   PANCREATIC STENT PLACEMENT  04/14/2022   Procedure: PANCREATIC STENT PLACEMENT;  Surgeon: Wilhelmenia Aloha Raddle., MD;  Location: [facility];  Service: Gastroenterology;;   PERCUTANEOUS CORONARY STENT INTERVENTION (  PCI-S) N/A 03/07/2012   Procedure: PERCUTANEOUS CORONARY STENT INTERVENTION (PCI-S);  Surgeon: Ozell Fell, MD;  Location: [facility] CATH LAB;  Service: Cardiovascular;  Laterality: N/A;   PERCUTANEOUS CORONARY STENT INTERVENTION (PCI-S) Right 10/23/2013   Procedure: PERCUTANEOUS CORONARY STENT INTERVENTION (PCI-S);  Surgeon: Ozell JONETTA Fell, MD;  Location: [facility] CATH LAB;  Service:  Cardiovascular;  Laterality: Right;   POLYPECTOMY  07/06/2018   Procedure: POLYPECTOMY;  Surgeon: Golda Claudis PENNER, MD;  [facility];  Service: Endoscopy;;  polyp at ampulla   POLYPECTOMY  03/25/2019   Procedure: AMPULLECTOMY;  Surgeon: Wilhelmenia Aloha Raddle., MD;  Location: [facility] ENDOSCOPY;  Service: Gastroenterology;;   POLYPECTOMY  11/28/2022   Procedure: POLYPECTOMY;  Surgeon: Wilhelmenia Aloha Raddle., MD;  Location: [facility];  Service: Gastroenterology;;   POLYPECTOMY  05/08/2023   Procedure: POLYPECTOMY;  Surgeon: Wilhelmenia Aloha Raddle., MD;  Location: [facility];  Service: Gastroenterology;;   REMOVAL OF STONES  03/08/2021   Procedure: REMOVAL OF STONES;  Surgeon: Wilhelmenia Aloha Raddle., MD;  Location: [facility];  Service: Gastroenterology;;   Right hand surgery     RIGHT HEART CATH AND CORONARY/GRAFT ANGIOGRAPHY N/A 12/16/2019   Procedure: RIGHT HEART CATH AND CORONARY/GRAFT ANGIOGRAPHY;  Surgeon: Mady Bruckner, MD;  Location: [facility]VAS[facility] CV LAB;  Service: Cardiovascular;  Laterality: N/A;   SPHINCTEROTOMY  03/25/2019   Procedure: SPHINCTEROTOMY;  Surgeon: Mansouraty, Aloha Raddle., MD;  Location: [facility] ENDOSCOPY;  Service: Gastroenterology;;   SUBMUCOSAL LIFTING INJECTION  11/28/2022   Procedure: SUBMUCOSAL LIFTING INJECTION;  Surgeon: Wilhelmenia Aloha Raddle., MD;  Location: [facility];  Service: Gastroenterology;;   SUBMUCOSAL LIFTING INJECTION  05/08/2023   Procedure: SUBMUCOSAL LIFTING INJECTION;  Surgeon: Wilhelmenia Aloha Raddle., MD;  Location: [facility];  Service: Gastroenterology;;   SUBMUCOSAL TATTOO INJECTION  11/28/2022   Procedure: SUBMUCOSAL TATTOO INJECTION;  Surgeon: Wilhelmenia Aloha Raddle., MD;  Location: [facility];  Service: Gastroenterology;;   TIBIA IM NAIL INSERTION Right 04/07/2016   Procedure: FIXATION OF RIGHT TIBIA FRACTURE;  Surgeon: Maude LELON Right, MD;  Location: [facility] OR;  Service: Orthopedics;  Laterality: Right;   Social History:  reports  that he has been smoking cigarettes. He started smoking about 58 years ago. He has a 56 pack-year smoking history. He has never used smokeless tobacco. He reports current alcohol  use. He reports that he does not use drugs.  No Known Allergies  Family History  Problem Relation Age of Onset   Coronary artery disease Other    Colon cancer Neg Hx    Esophageal cancer Neg Hx    Inflammatory bowel disease Neg Hx    Liver disease Neg Hx    Pancreatic cancer Neg Hx    Rectal cancer Neg Hx    Stomach cancer Neg Hx     Prior to Admission medications   Medication Sig Start Date End Date Taking? Authorizing Provider  acetaminophen  (TYLENOL ) 500 MG tablet Take 1,000 mg by mouth every 6 (six) hours as needed for mild pain, moderate pain or headache.    Yes [provider]  albuterol  (PROVENTIL ) (2.5 MG/3ML) 0.083% nebulizer solution Take 2.5 mg by nebulization every 4 (four) hours as needed for wheezing or shortness of breath. 10/12/20  Yes [provider]  amLODipine  (NORVASC ) 5 MG tablet TAKE 1 TABLET BY MOUTH DAILY 01/27/23  Yes Branch, Dorn FALCON, MD  aspirin  81 MG EC tablet Take 1 tablet (81 mg total) by mouth daily. 07/09/18  Yes Rehman, Claudis PENNER, MD  carvedilol  (COREG ) 3.125 MG tablet TAKE  1 TABLET BY MOUTH TWICE DAILY 01/27/23  Yes Branch, Dorn FALCON, MD  cetirizine (ZYRTEC) 10 MG tablet Take 10 mg by mouth daily as needed for allergies.   Yes [provider]  furosemide  (LASIX ) 20 MG tablet Take 20 mg by mouth daily as needed for edema.   Yes [provider]  lisinopril  (ZESTRIL ) 2.5 MG tablet Take 1 tablet (2.5 mg total) by mouth daily. 12/18/19  Yes Meng, Hao, PA  magnesium  hydroxide (MILK OF MAGNESIA) 400 MG/5ML suspension Take 15 mLs by mouth daily as needed for mild constipation. 01/24/21  Yes Ricky Fines, MD  melatonin 5 MG TABS Take 5 mg by mouth.   Yes [provider]  nitroGLYCERIN  (NITROSTAT ) 0.4 MG SL tablet Place 1 tablet (0.4 mg total) under  the tongue every 5 (five) minutes x 3 doses as needed for chest pain. 03/03/15  Yes Marylu Leita SAUNDERS, NP  OXYGEN  Inhale 2 L into the lungs at bedtime as needed (at night as needed).   Yes [provider]  pantoprazole  (PROTONIX ) 40 MG tablet Take 40 mg by mouth 2 (two) times daily.   Yes [provider]  Polyethylene Glycol 400 (BLINK TEARS OP) Place 1 drop into both eyes daily as needed (dry eyes).   Yes [provider]  RANEXA  1000 MG SR tablet TAKE ONE TABLET BY MOUTH TWICE DAILY 03/17/17  Yes Koneswaran, Suresh A, MD  rosuvastatin  (CRESTOR ) 40 MG tablet Take 1 tablet (40 mg total) by mouth at bedtime. 12/17/19  Yes Meng, Hao, PA  warfarin (COUMADIN ) 2 MG tablet TAKE 1 TABLET BY MOUTH DAILY EXCEPT 1/2 TABLET ON MONDAYS AND THURSDAYS OR AS DIRECTED Patient taking differently: Take 2 mg by mouth See admin instructions. TAKE 1 TABLET BY MOUTH DAILY EXCEPT 1/2 TABLET ON Friday OR AS DIRECTED 05/10/23  Yes Mansouraty, Aloha Raddle., MD    Physical Exam: Vitals:   07/27/23 1100 07/27/23 1245 07/27/23 1600 07/27/23 1627  BP: (!) 157/75 (!) 161/87 (!) 163/74 (!) 167/79  Pulse: 76 93 86 80  Resp: 17 14 14 16   Temp:    98 F (36.7 C)  TempSrc:    Oral  SpO2: 96% 93% 95% 94%  Weight:      Height:         Physical Exam  Gen:- Awake Alert, in no acute distress , dyspnea on exertion HEENT:- [place].AT, No sclera icterus Nose- [place] 3L/min Neck-Supple Neck,No JVD,.  Lungs-diminished breath sounds, scattered wheezes bilaterally CV- S1, S2 normal, RRR Abd-  +ve B.Sounds, Abd Soft, No tenderness,    Extremity/Skin:- No  edema,   good pedal pulses  Psych-affect is appropriate, oriented x3 Neuro-no new focal deficits, no tremors   Data Reviewed: CTA chest without PE, severe thoracic aortic atherosclerosis noted, mild emphysema noted, with findings of bronchitis, no discrete pneumonia -COVID, RSV and influenza negative -Troponin is 9 , repeat is 8 -BNP 278 -LFTs WNL, creatinine  1.0, potassium 3.9 -WBC 8.4, platelets 261 hemoglobin 12.6  Assessment and Plan: 1)Acute COPD Exacerbation- no definite pneumonia, --Treat empirically with IV Solu-Medrol  ,  give mucolytics, doxycycline  and bronchodilators as ordered, supplemental oxygen  as ordered.   2) acute hypoxic respiratory failure--- due to #1 above -Manage as above #1 -Currently requiring 3 L of oxygen  via nasal cannula  3)HTN--continue amlodipine  , lisinopril  along with Coreg  -May use IV hydralazine  as needed elevated BP  4)CAD/PAD--status post prior CABG and angioplasty with stenting -Status post prior triple AAA repair -Chest pain-free -Continue aspirin ,  Coreg , Ranexa  and Crestor   5) chronic anticoagulation--patient with prior history of LV thrombus -Coumadin  per pharmacy  6)GERD--stable, continue Protonix  especially while on steroids    Advance Care Planning:   Code Status: Full Code   Family Communication: Discussed with wife at bedside   Severity of Illness: The appropriate patient status for this patient is OBSERVATION. Observation status is judged to be reasonable and necessary in order to provide the required intensity of service to ensure the patient's safety. The patient's presenting symptoms, physical exam findings, and initial radiographic and laboratory data in the context of their medical condition is felt to place them at decreased risk for further clinical deterioration. Furthermore, it is anticipated that the patient will be medically stable for discharge from the hospital within 2 midnights of admission.   Author: Rendall Carwin, MD 07/27/2023 6:47 PM  For on call review [email or web address].

## 2023-07-28 DIAGNOSIS — Z20822 Contact with and (suspected) exposure to covid-19: Secondary | ICD-10-CM | POA: Diagnosis not present

## 2023-07-28 DIAGNOSIS — E119 Type 2 diabetes mellitus without complications: Secondary | ICD-10-CM | POA: Diagnosis not present

## 2023-07-28 DIAGNOSIS — J9621 Acute and chronic respiratory failure with hypoxia: Secondary | ICD-10-CM | POA: Diagnosis not present

## 2023-07-28 DIAGNOSIS — I739 Peripheral vascular disease, unspecified: Secondary | ICD-10-CM | POA: Diagnosis not present

## 2023-07-28 DIAGNOSIS — I2510 Atherosclerotic heart disease of native coronary artery without angina pectoris: Secondary | ICD-10-CM | POA: Diagnosis not present

## 2023-07-28 DIAGNOSIS — R0789 Other chest pain: Secondary | ICD-10-CM

## 2023-07-28 DIAGNOSIS — Z951 Presence of aortocoronary bypass graft: Secondary | ICD-10-CM | POA: Diagnosis not present

## 2023-07-28 DIAGNOSIS — I10 Essential (primary) hypertension: Secondary | ICD-10-CM | POA: Diagnosis not present

## 2023-07-28 DIAGNOSIS — J9601 Acute respiratory failure with hypoxia: Secondary | ICD-10-CM | POA: Diagnosis not present

## 2023-07-28 DIAGNOSIS — J441 Chronic obstructive pulmonary disease with (acute) exacerbation: Secondary | ICD-10-CM | POA: Diagnosis not present

## 2023-07-28 LAB — PROTIME-INR
INR: 1.7 — ABNORMAL HIGH (ref 0.8–1.2)
Prothrombin Time: 20.3 s — ABNORMAL HIGH (ref 11.4–15.2)

## 2023-07-28 LAB — HIV ANTIBODY (ROUTINE TESTING W REFLEX): HIV Screen 4th Generation wRfx: NONREACTIVE

## 2023-07-28 MED ORDER — DOXYCYCLINE HYCLATE 100 MG PO TABS: 100.0000 mg | ORAL_TABLET | Freq: Two times a day (BID) | ORAL | 0 refills | Status: AC

## 2023-07-28 MED ORDER — NITROGLYCERIN 0.4 MG SL SUBL
0.4000 mg | SUBLINGUAL_TABLET | SUBLINGUAL | Status: DC | PRN
  Administered 2023-07-28 (×2): 0.4 mg via SUBLINGUAL
  Filled 2023-07-28 (×2): qty 1

## 2023-07-28 MED ORDER — PHENOL 1.4 % MT LIQD
1.0000 | OROMUCOSAL | Status: DC | PRN
  Filled 2023-07-28: qty 177

## 2023-07-28 MED ORDER — ASPIRIN 81 MG PO TBEC: 81.0000 mg | DELAYED_RELEASE_TABLET | Freq: Every day | ORAL | 11 refills | Status: DC

## 2023-07-28 MED ORDER — ACETAMINOPHEN 325 MG PO TABS: 650.0000 mg | ORAL_TABLET | Freq: Four times a day (QID) | ORAL | Status: AC | PRN

## 2023-07-28 MED ORDER — PANTOPRAZOLE SODIUM 40 MG PO TBEC: 40.0000 mg | DELAYED_RELEASE_TABLET | Freq: Two times a day (BID) | ORAL | 3 refills | Status: AC

## 2023-07-28 MED ORDER — ALBUTEROL SULFATE (2.5 MG/3ML) 0.083% IN NEBU: 2.5000 mg | INHALATION_SOLUTION | RESPIRATORY_TRACT | 5 refills | Status: AC | PRN

## 2023-07-28 MED ORDER — PREDNISONE 20 MG PO TABS: 40.0000 mg | ORAL_TABLET | Freq: Every day | ORAL | 0 refills | Status: AC

## 2023-07-28 MED ORDER — ALUM & MAG HYDROXIDE-SIMETH 200-200-20 MG/5ML PO SUSP
15.0000 mL | ORAL | Status: DC | PRN
  Administered 2023-07-28: 15 mL via ORAL
  Filled 2023-07-28: qty 30

## 2023-07-28 MED ORDER — WARFARIN SODIUM 2.5 MG PO TABS: 2.5000 mg | ORAL_TABLET | Freq: Once | ORAL | Status: DC

## 2023-07-28 MED ORDER — GUAIFENESIN ER 600 MG PO TB12: 600.0000 mg | ORAL_TABLET | Freq: Two times a day (BID) | ORAL | 2 refills | Status: AC

## 2023-07-28 NOTE — Progress Notes (Signed)
 Pt c/o central chest aching that came on all of a sudden. Pt very gaseous and has been belching lots. Does not radiate any where. Per patient it usually subsides after 1 nitroglycerin . This RN reached out to Dr Manfred who orders nitroglycerin  and EKG.  Pt is hypertensive with systolic in 180's.  Will recheck BP after nitroglycerin . Kellogg RN

## 2023-07-28 NOTE — Progress Notes (Signed)
 Discharge instructions given, patient verbalized understanding, IV removed, sent home via private vehicle.

## 2023-07-28 NOTE — Discharge Instructions (Signed)
 1)Avoid ibuprofen/Advil/Aleve/Motrin/Goody Powders/Naproxen/BC powders/Meloxicam/Diclofenac /Indomethacin  and other Nonsteroidal anti-inflammatory medications as these will make you more likely to bleed and can cause stomach ulcers, can also cause Kidney problems.   2)You need oxygen  at home at 2 L via nasal cannula continuously while awake and while asleep--- smoking or having open fires around oxygen  can cause fire, significant injury and death  3)Follow up with [name], PA (Primary Care Provider)-- in 1 week

## 2023-07-28 NOTE — Plan of Care (Signed)
   Problem: Activity: Goal: Risk for activity intolerance will decrease Outcome: Progressing   Problem: Nutrition: Goal: Adequate nutrition will be maintained Outcome: Progressing

## 2023-07-28 NOTE — Progress Notes (Signed)
 PHARMACY - ANTICOAGULATION CONSULT NOTE  Pharmacy Consult for warfarin Indication:  history of LV thrombus  No Known Allergies  Patient Measurements: Height: 6' (182.9 cm) Weight: 93.4 kg (206 lb) IBW/kg (Calculated) : 77.6  Vital Signs: Temp: 97.4 F (36.3 C) (02/07 0430) Temp Source: Oral (02/07 0430) BP: 156/78 (02/07 0608) Pulse Rate: 76 (02/07 0608)  Labs: Recent Labs    07/27/23 0329 07/27/23 0507 07/28/23 0548  HGB 12.6*  --   --   HCT 39.7  --   --   PLT 261  --   --   LABPROT 19.8*  --  20.3*  INR 1.7*  --  1.7*  CREATININE 1.01  --   --   TROPONINIHS 9 8  --     Estimated Creatinine Clearance: 80.8 mL/min (by C-G formula based on SCr of 1.01 mg/dL).   Medical History: Past Medical History:  Diagnosis Date   Ampullary adenoma    Arthritis    Reported by patient   Cancer [facility])    Reported by patient   Claustrophobia    Closed fracture of right distal tibia    COPD (chronic obstructive pulmonary disease) (HCC)    Reported by patient   Coronary artery disease    a. 1999 Inf MI;  b. 2003 CABGx6;  c. 2008 NSTEMI, occluded SVG to marginal and diagonal system. 2 DES placed to marginal system;  c. NSTEMI - 02/2012 Xience Xpedition DES to oS-OM and POBA to mid lesion;  c. 10/2013 NSTEMI/PCI: VG->OM 80 ISR, native OM 80/90 small, LIMA->LAD ok w/ 90 dLAD, D1 80, RIMA->RPL ok w/ 95 in RPL (2.5x14 Resolute DES), nl EF.   Dyspnea    Reported by patient   GERD (gastroesophageal reflux disease)    Hyperlipidemia    Hypertension    Myocardial infarction (HCC)    X 6   Pneumonia    S/P AAA repair    Tobacco abuse    Type 2 diabetes mellitus without complication, without long-term current use of insulin  (HCC)    Wears dentures    Wears glasses     Assessment: 73 year old male on warfarin for history of LV thrombus.   INR subtherapeutic at 1.7. Hemoglobin 12.6. No bleeding issues noted.   Warfarin regimen prior to admit is 2mg  every day x 1mg  on Fridays    Goal of Therapy:  INR 2-3 Monitor platelets by anticoagulation protocol: Yes   Plan:  Warfarin 2.5 mg x 1 dose Daily INR   Elspeth Sour, PharmD Clinical Pharmacist 07/28/2023 8:24 AM

## 2023-07-28 NOTE — TOC CM/SW Note (Signed)
 Transition of Care [facility]) - Inpatient Brief Assessment   Patient Details  Name: Clifford Jones MRN: [id number] Date of Birth: [birth year]  Transition of Care [facility]) CM/SW Contact:    Lucie Lunger, LCSWA Phone Number: 07/28/2023, 8:48 AM   Clinical Narrative: Transition of Care Department [facility]) has reviewed patient and no TOC needs have been identified at this time. We will continue to monitor patient advancement through interdisciplinary progression rounds. If new patient transition needs arise, please place a TOC consult.    Transition of Care Asessment: Insurance and Status: Insurance coverage has been reviewed Patient has primary care physician: Yes Home environment has been reviewed: From home Prior level of function:: Independent Prior/Current Home Services: No current home services Social Drivers of Health Review: SDOH reviewed no interventions necessary Readmission risk has been reviewed: Yes Transition of care needs: no transition of care needs at this time

## 2023-07-28 NOTE — Progress Notes (Signed)
 Overnight patient had 2 episodes of central chest pain that he described as aching and rated 10/10 accompanied by lots of forceful belching. Nothing worsens pain but he reports a dose of nitroglycerin  improves pain. At these episodes pt is hypertensive as high as  200's. This RN gave dose of nitroglycerin  and maalox which relieved symptoms.  and  Dr Manfred was informed and came to bedside to assess patient. No further orders provided. Kellogg RN

## 2023-07-28 NOTE — Progress Notes (Signed)
 Progress note  RN called due to patient complaining of midsternal chest pain.  Patient states that his nitroglycerin  usually resolve this kind of pain.  Home nitroglycerin  was given with improved chest pain.  EKG done showed normal sinus rhythm at a rate of 97 bpm.  BP temporarily increased to 186/95 (taken while patient was having chest pain), but this has since decreased to 153/83 s/p nitroglycerin .  Continue to monitor patient and treat accordingly.  RN called again around 5:02 AM due to patient's BP at 201/107 and was having chest pain.  Nitroglycerin  was given and shortly after this BP improved to 156/78. At bedside, it was realized that patient actually had similar chest pain in the ED and each of the chest pain episodes was preceded by eating snacks (peanut butter and crackers).  Patient states that he has had similar presentation at home, though not as frequent.  Patient endorsed history of GERD and is on Protonix  at home, he follows with Dr. Wilhelmenia of [facility].  Patient's pain may be related to this.  Total time:  32 minutes This includes time reviewing the chart including progress notes, labs, EKGs, taking medical decisions, ordering labs and documenting findings.  Please refer to admission H&P for details regarding the care of this patient

## 2023-07-28 NOTE — Discharge Summary (Signed)
 Clifford Jones, is a 73 y.o. male  DOB 1951/01/06  MRN [id number].  Admission date:  07/27/2023  Admitting Physician  Posey Maier, DO  Discharge Date:  07/28/2023   Primary MD  [name], PA  Recommendations for primary care physician for things to follow:  1)Avoid ibuprofen/Advil/Aleve/Motrin/Goody Powders/Naproxen/BC powders/Meloxicam/Diclofenac /Indomethacin  and other Nonsteroidal anti-inflammatory medications as these will make you more likely to bleed and can cause stomach ulcers, can also cause Kidney problems.   2)You need oxygen  at home at 2 L via nasal cannula continuously while awake and while asleep--- smoking or having open fires around oxygen  can cause fire, significant injury and death  3)Follow up with [name], PA (Primary Care Provider)-- in 1 week   Admission Diagnosis  Acute exacerbation of chronic obstructive pulmonary disease (COPD) (HCC) [J44.1] COPD exacerbation (HCC) [J44.1] Acute respiratory failure with hypoxia (HCC) [J96.01]   Discharge Diagnosis  Acute exacerbation of chronic obstructive pulmonary disease (COPD) (HCC) [J44.1] COPD exacerbation (HCC) [J44.1] Acute respiratory failure with hypoxia (HCC) [J96.01]    Principal Problem:   Acute exacerbation of chronic obstructive pulmonary disease (COPD) (HCC) Active Problems:   Peripheral vascular disease (HCC)   Tobacco abuse   CAD (coronary artery disease)   Abnormal findings on esophagogastroduodenoscopy (EGD)   Acute hypoxemic respiratory failure (HCC)   LV (left ventricular) mural thrombus   Acute on chronic respiratory failure with hypoxia (HCC)   Type 2 diabetes mellitus without complication, without long-term current use of insulin  (HCC)      Past Medical History:  Diagnosis Date   Ampullary adenoma    Arthritis    Reported by patient   Cancer [facility])    Reported by patient   Claustrophobia    Closed  fracture of right distal tibia    COPD (chronic obstructive pulmonary disease) (HCC)    Reported by patient   Coronary artery disease    a. 1999 Inf MI;  b. 2003 CABGx6;  c. 2008 NSTEMI, occluded SVG to marginal and diagonal system. 2 DES placed to marginal system;  c. NSTEMI - 02/2012 Xience Xpedition DES to oS-OM and POBA to mid lesion;  c. 10/2013 NSTEMI/PCI: VG->OM 80 ISR, native OM 80/90 small, LIMA->LAD ok w/ 90 dLAD, D1 80, RIMA->RPL ok w/ 95 in RPL (2.5x14 Resolute DES), nl EF.   Dyspnea    Reported by patient   GERD (gastroesophageal reflux disease)    Hyperlipidemia    Hypertension    Myocardial infarction (HCC)    X 6   Pneumonia    S/P AAA repair    Tobacco abuse    Type 2 diabetes mellitus without complication, without long-term current use of insulin  (HCC)    Wears dentures    Wears glasses     Past Surgical History:  Procedure Laterality Date   ABDOMINAL AORTIC ANEURYSM REPAIR  2006   APPENDECTOMY     BIOPSY  03/08/2021   Procedure: BIOPSY;  Surgeon: Wilhelmenia Aloha Raddle., MD;  Location: [facility];  Service: Gastroenterology;;  CATARACT EXTRACTION W/PHACO Right 11/05/2021   Procedure: CATARACT EXTRACTION PHACO AND INTRAOCULAR LENS PLACEMENT (IOC);  Surgeon: Harrie Agent, MD;  Location: [facility];  Service: Ophthalmology;  Laterality: Right;  CDE: 11.33   COLONOSCOPY WITH PROPOFOL  N/A 11/28/2022   Procedure: COLONOSCOPY WITH PROPOFOL ;  Surgeon: Wilhelmenia Aloha Raddle., MD;  Location: [facility];  Service: Gastroenterology;  Laterality: N/A;   COLONOSCOPY WITH PROPOFOL  N/A 05/08/2023   Procedure: COLONOSCOPY WITH PROPOFOL ;  Surgeon: Wilhelmenia Aloha Raddle., MD;  Location: [facility];  Service: Gastroenterology;  Laterality: N/A;   CORONARY ANGIOPLASTY WITH STENT PLACEMENT     CORONARY ARTERY BYPASS GRAFT  2003   ENDOSCOPIC MUCOSAL RESECTION  11/28/2022   Procedure: ENDOSCOPIC MUCOSAL RESECTION;  Surgeon: Wilhelmenia Aloha Raddle., MD;  Location: [facility];   Service: Gastroenterology;;   ENDOSCOPIC MUCOSAL RESECTION N/A 05/08/2023   Procedure: ENDOSCOPIC MUCOSAL RESECTION;  Surgeon: Wilhelmenia Aloha Raddle., MD;  Location: [facility];  Service: Gastroenterology;  Laterality: N/A;   ENDOSCOPIC RETROGRADE CHOLANGIOPANCREATOGRAPHY (ERCP) WITH PROPOFOL  N/A 03/25/2019   Procedure: ENDOSCOPIC RETROGRADE CHOLANGIOPANCREATOGRAPHY (ERCP) WITH PROPOFOL ;  Surgeon: Wilhelmenia Aloha Raddle., MD;  Location: [facility]OSCOPY;  Service: Gastroenterology;  Laterality: N/A;   ENDOSCOPIC RETROGRADE CHOLANGIOPANCREATOGRAPHY (ERCP) WITH PROPOFOL  N/A 03/08/2021   Procedure: ENDOSCOPIC RETROGRADE CHOLANGIOPANCREATOGRAPHY (ERCP) WITH PROPOFOL ;  Surgeon: Wilhelmenia Aloha Raddle., MD;  [facility];  Service: Gastroenterology;  Laterality: N/A;   ENDOSCOPIC RETROGRADE CHOLANGIOPANCREATOGRAPHY (ERCP) WITH PROPOFOL  N/A 04/14/2022   Procedure: ENDOSCOPIC RETROGRADE CHOLANGIOPANCREATOGRAPHY (ERCP) WITH PROPOFOL ;  Surgeon: Wilhelmenia Aloha Raddle., MD;  [facility];  Service: Gastroenterology;  Laterality: N/A;   ESOPHAGEAL DILATION  07/06/2018   Procedure: ESOPHAGEAL DILATION;  Surgeon: Golda Claudis PENNER, MD;  [facility];  Service: Endoscopy;;   ESOPHAGOGASTRODUODENOSCOPY N/A 07/06/2018   Procedure: ESOPHAGOGASTRODUODENOSCOPY (EGD);  Surgeon: Golda Claudis PENNER, MD;  [facility];  Service: Endoscopy;  Laterality: N/A;   ESOPHAGOGASTRODUODENOSCOPY N/A 08/16/2018   Procedure: ESOPHAGOGASTRODUODENOSCOPY (EGD);  Surgeon: Teressa Toribio SQUIBB, MD;  Location: [facility];  Service: Endoscopy;  Laterality: N/A;   ESOPHAGOGASTRODUODENOSCOPY (EGD) WITH PROPOFOL  N/A 03/25/2019   Procedure: ESOPHAGOGASTRODUODENOSCOPY (EGD) WITH PROPOFOL ;  Surgeon: Wilhelmenia Aloha Raddle., MD;  Location: [facility];  Service: Gastroenterology;  Laterality: N/A;   EUS N/A 08/16/2018   Procedure: UPPER ENDOSCOPIC ULTRASOUND (EUS) RADIAL;  Surgeon: Teressa Toribio SQUIBB, MD;  Location: [facility];  Service: Endoscopy;  Laterality: N/A;   EUS N/A 03/25/2019   Procedure: UPPER ENDOSCOPIC ULTRASOUND (EUS) RADIAL;  Surgeon: Wilhelmenia Aloha Raddle., MD;  Location: [facility] ENDOSCOPY;  Service: Gastroenterology;  Laterality: N/A;   FOREIGN BODY REMOVAL  07/06/2018   Procedure: FOREIGN BODY REMOVAL;  Surgeon: Golda Claudis PENNER, MD;  [facility];  Service: Endoscopy;;   HEMOSTASIS CLIP PLACEMENT  11/28/2022   Procedure: HEMOSTASIS CLIP PLACEMENT;  Surgeon: Wilhelmenia Aloha Raddle., MD;  Location: [facility];  Service: Gastroenterology;;   HEMOSTASIS CLIP PLACEMENT  05/08/2023   Procedure: HEMOSTASIS CLIP PLACEMENT;  Surgeon: Wilhelmenia Aloha Raddle., MD;  Location: [facility];  Service: Gastroenterology;;   HERNIA REPAIR     umbicical   LEFT HEART CATHETERIZATION WITH CORONARY ANGIOGRAM N/A 03/05/2012   Procedure: LEFT HEART CATHETERIZATION WITH CORONARY ANGIOGRAM;  Surgeon: Debby JONETTA Como, MD;  Location: [facility] CATH LAB;  Service: Cardiovascular;  Laterality: N/A;   LEFT HEART CATHETERIZATION WITH CORONARY ANGIOGRAM N/A 10/23/2013   Procedure: LEFT HEART CATHETERIZATION WITH CORONARY ANGIOGRAM;  Surgeon: Ozell JONETTA Fell, MD;  Location: [facility] CATH LAB;  Service: Cardiovascular;  Laterality: N/A;  LEFT HEART CATHETERIZATION WITH CORONARY/GRAFT ANGIOGRAM  10/22/2013   Procedure: LEFT HEART CATHETERIZATION WITH EL BILE;  Surgeon: Alm LELON Clay, MD;  Location: [facility] CATH LAB;  Service: Cardiovascular;;   PANCREATIC STENT PLACEMENT  03/25/2019   Procedure: PANCREATIC STENT PLACEMENT;  Surgeon: Wilhelmenia Aloha Raddle., MD;  Location: [facility] ENDOSCOPY;  Service: Gastroenterology;;   PANCREATIC STENT PLACEMENT  03/08/2021   Procedure: PANCREATIC STENT PLACEMENT;  Surgeon: Wilhelmenia Aloha Raddle., MD;  Location: [facility];  Service: Gastroenterology;;   PANCREATIC STENT PLACEMENT  04/14/2022   Procedure: PANCREATIC STENT PLACEMENT;  Surgeon: Wilhelmenia Aloha Raddle., MD;  Location: [facility];  Service: Gastroenterology;;   PERCUTANEOUS CORONARY STENT INTERVENTION (PCI-S) N/A 03/07/2012   Procedure: PERCUTANEOUS CORONARY STENT INTERVENTION (PCI-S);  Surgeon: Ozell Fell, MD;  Location: [facility] CATH LAB;  Service: Cardiovascular;  Laterality: N/A;   PERCUTANEOUS CORONARY STENT INTERVENTION (PCI-S) Right 10/23/2013   Procedure: PERCUTANEOUS CORONARY STENT INTERVENTION (PCI-S);  Surgeon: Ozell JONETTA Fell, MD;  Location: [facility] CATH LAB;  Service: Cardiovascular;  Laterality: Right;   POLYPECTOMY  07/06/2018   Procedure: POLYPECTOMY;  Surgeon: Golda Claudis PENNER, MD;  [facility];  Service: Endoscopy;;  polyp at ampulla   POLYPECTOMY  03/25/2019   Procedure: AMPULLECTOMY;  Surgeon: Wilhelmenia Aloha Raddle., MD;  Location: [facility] ENDOSCOPY;  Service: Gastroenterology;;   POLYPECTOMY  11/28/2022   Procedure: POLYPECTOMY;  Surgeon: Wilhelmenia Aloha Raddle., MD;  Location: [facility];  Service: Gastroenterology;;   POLYPECTOMY  05/08/2023   Procedure: POLYPECTOMY;  Surgeon: Wilhelmenia Aloha Raddle., MD;  Location: [facility];  Service: Gastroenterology;;   REMOVAL OF STONES  03/08/2021   Procedure: REMOVAL OF STONES;  Surgeon: Wilhelmenia Aloha Raddle., MD;  Location: [facility];  Service: Gastroenterology;;   Right hand surgery     RIGHT HEART CATH AND CORONARY/GRAFT ANGIOGRAPHY N/A 12/16/2019   Procedure: RIGHT HEART CATH AND CORONARY/GRAFT ANGIOGRAPHY;  Surgeon: Mady Bruckner, MD;  Location: [facility];  Service: Cardiovascular;  Laterality: N/A;   SPHINCTEROTOMY  03/25/2019   Procedure: SPHINCTEROTOMY;  Surgeon: Mansouraty, Aloha Raddle., MD;  Location: [facility] ENDOSCOPY;  Service: Gastroenterology;;   SUBMUCOSAL LIFTING INJECTION  11/28/2022   Procedure: SUBMUCOSAL LIFTING INJECTION;  Surgeon: Wilhelmenia Aloha Raddle., MD;  Location: [facility];  Service: Gastroenterology;;   SUBMUCOSAL LIFTING INJECTION  05/08/2023   Procedure: SUBMUCOSAL LIFTING INJECTION;  Surgeon:  Wilhelmenia Aloha Raddle., MD;  Location: [facility];  Service: Gastroenterology;;   SUBMUCOSAL TATTOO INJECTION  11/28/2022   Procedure: SUBMUCOSAL TATTOO INJECTION;  Surgeon: Wilhelmenia Aloha Raddle., MD;  Location: [facility];  Service: Gastroenterology;;   TIBIA IM NAIL INSERTION Right 04/07/2016   Procedure: FIXATION OF RIGHT TIBIA FRACTURE;  Surgeon: Maude LELON Right, MD;  Location: [facility] OR;  Service: Orthopedics;  Laterality: Right;     HPI  from the history and physical done on the day of admission:    HPI: Clifford Jones is a 73 y.o. male with medical history significant for COPD, CAD, status post prior CABG, status post prior angioplasty and stenting,  HTN, PAD/status post AAA repair, DM2, and history of GERD who presents to the ED with cough, headache, congestion and dyspnea for less than 48 hours -In the ED patient is found to have O2 sats of 82% on room air he was placed on 2 L of oxygen  in the ED and O2 sats came up to 91% -Denies flank chest pains or pleuritic symptoms No fever  Or chills  - reports fatigue and malaise  -CTA chest without  PE, severe thoracic aortic atherosclerosis noted, mild emphysema noted, with findings of bronchitis, no discrete pneumonia -COVID, RSV and influenza negative -Troponin is 9 , repeat is 8 -BNP 278 -LFTs WNL, creatinine 1.0, potassium 3.9 -WBC 8.4, platelets 261 hemoglobin 12.6   -Additional history obtained from patient's wife at bedside   Hospital Course:     Assessment and Plan: 1)Acute COPD Exacerbation- no definite pneumonia, --Treated with IV Solu-Medrol  , mucolytics, doxycycline  and bronchodilators as ordered,  ---okay to discharge on doxycycline  and prednisone , bronchodilators and mucolytics   2) acute on chronic hypoxic respiratory failure--- due to #1 above -Manage as above #1 -Hypoxia resolved -Patient has been weaned off oxygen  -95% on room air, 90 to 92% post ambulation on room air -Please note that prior to admission  patient has home O2 that he uses at home as needed is advised to continue to use this as needed--patient and wife have an oximeter that he used to check oxygen  at home from time to time   3)HTN--continue amlodipine  , lisinopril  along with Coreg    4)CAD/PAD--status post prior CABG and angioplasty with stenting -Status post prior triple AAA repair -Chest pain-free -Continue aspirin , Coreg , Ranexa  and Crestor    5) chronic anticoagulation--patient with prior history of LV thrombus -c/n Coumadin     6)GERD--stable, continue Protonix  especially while on steroids  Discharge Condition: stable  Follow UP   Follow-up Information     [name], PA. Schedule an appointment as soon as possible for a visit in 1 week(s).   Specialty: Physician Assistant Contact information: [address] [place] [address] [phone]                  Diet and Activity recommendation:  As advised  Discharge Instructions   Discharge Instructions     Call MD for:  difficulty breathing, headache or visual disturbances   Complete by: As directed    Call MD for:  persistant dizziness or light-headedness   Complete by: As directed    Call MD for:  persistant nausea and vomiting   Complete by: As directed    Call MD for:  temperature >100.4   Complete by: As directed    Diet - low sodium heart healthy   Complete by: As directed    Discharge instructions   Complete by: As directed    1)Avoid ibuprofen/Advil/Aleve/Motrin/Goody Powders/Naproxen/BC powders/Meloxicam/Diclofenac /Indomethacin  and other Nonsteroidal anti-inflammatory medications as these will make you more likely to bleed and can cause stomach ulcers, can also cause Kidney problems.   2)You need oxygen  at home at 2 L via nasal cannula continuously while awake and while asleep--- smoking or having open fires around oxygen  can cause fire, significant injury and death  3)Follow up with [name], PA (Primary Care Provider)--  in 1 week   Increase activity slowly   Complete by: As directed        Discharge Medications     Allergies as of 07/28/2023   No Known Allergies      Medication List     TAKE these medications    acetaminophen  325 MG tablet Commonly known as: TYLENOL  Take 2 tablets (650 mg total) by mouth every 6 (six) hours as needed for mild pain (pain score 1-3), headache or fever (or Fever >/= 101). What changed:  medication strength how much to take reasons to take this   albuterol  (2.5 MG/3ML) 0.083% nebulizer solution Commonly known as: PROVENTIL  Take 3 mLs (2.5 mg total) by nebulization every 4 (  four) hours as needed for wheezing or shortness of breath (cough). What changed: reasons to take this   amLODipine  5 MG tablet Commonly known as: NORVASC  TAKE 1 TABLET BY MOUTH DAILY   aspirin  EC 81 MG tablet Take 1 tablet (81 mg total) by mouth daily with breakfast. What changed: when to take this   BLINK TEARS OP Place 1 drop into both eyes daily as needed (dry eyes).   carvedilol  3.125 MG tablet Commonly known as: COREG  TAKE 1 TABLET BY MOUTH TWICE DAILY   cetirizine 10 MG tablet Commonly known as: ZYRTEC Take 10 mg by mouth daily as needed for allergies.   doxycycline  100 MG tablet Commonly known as: VIBRA -TABS Take 1 tablet (100 mg total) by mouth 2 (two) times daily for 5 days.   furosemide  20 MG tablet Commonly known as: LASIX  Take 20 mg by mouth daily as needed for edema.   guaiFENesin  600 MG 12 hr tablet Commonly known as: Mucinex  Take 1 tablet (600 mg total) by mouth 2 (two) times daily.   lisinopril  2.5 MG tablet Commonly known as: ZESTRIL  Take 1 tablet (2.5 mg total) by mouth daily.   magnesium  hydroxide 400 MG/5ML suspension Commonly known as: MILK OF MAGNESIA Take 15 mLs by mouth daily as needed for mild constipation.   melatonin 5 MG Tabs Take 5 mg by mouth.   nitroGLYCERIN  0.4 MG SL tablet Commonly known as: NITROSTAT  Place 1 tablet (0.4 mg  total) under the tongue every 5 (five) minutes x 3 doses as needed for chest pain.   OXYGEN  Inhale 2 L into the lungs at bedtime as needed (at night as needed).   pantoprazole  40 MG tablet Commonly known as: PROTONIX  Take 1 tablet (40 mg total) by mouth 2 (two) times daily.   predniSONE  20 MG tablet Commonly known as: DELTASONE  Take 2 tablets (40 mg total) by mouth daily with breakfast for 5 days.   Ranexa  1000 MG SR tablet Generic drug: ranolazine  TAKE ONE TABLET BY MOUTH TWICE DAILY   rosuvastatin  40 MG tablet Commonly known as: CRESTOR  Take 1 tablet (40 mg total) by mouth at bedtime.   warfarin 2 MG tablet Commonly known as: COUMADIN  Take as directed. If you are unsure how to take this medication, talk to your nurse or doctor. Original instructions: TAKE 1 TABLET BY MOUTH DAILY EXCEPT 1/2 TABLET ON MONDAYS AND THURSDAYS OR AS DIRECTED What changed:  how much to take how to take this when to take this additional instructions        Major procedures and Radiology Reports - PLEASE review detailed and final reports for all details, in brief -   CT Angio Chest Pulmonary Embolism (PE) W or WO Contrast Result Date: 07/27/2023 CLINICAL DATA:  73 year old male with shortness of breath and cough. Patient thinks he might have COVID-19. EXAM: CT ANGIOGRAPHY CHEST WITH CONTRAST TECHNIQUE: Multidetector CT imaging of the chest was performed using the standard protocol during bolus administration of intravenous contrast. Multiplanar CT image reconstructions and MIPs were obtained to evaluate the vascular anatomy. RADIATION DOSE REDUCTION: This exam was performed according to the departmental dose-optimization program which includes automated exposure control, adjustment of the mA and/or kV according to patient size and/or use of iterative reconstruction technique. CONTRAST:  75mL OMNIPAQUE  IOHEXOL  350 MG/ML SOLN COMPARISON:  Chest radiographs 0325 hours today. Report of CT Abdomen and Pelvis  05/10/2017 (no images available). FINDINGS: Cardiovascular: Relatively good contrast bolus timing in the pulmonary arterial tree. No pulmonary artery filling  defect identified. Extensive Calcified aortic atherosclerosis. Lobulated and tortuous distal aortic arch and descending thoracic aorta with areas of bulky mural plaque, borderline aneurysmal enlargement (descending thoracic aorta series 10, image 108 39 mm diameter). No dissection or periaortic hematoma. Superimposed previous CABG. Cardiac size at the upper limits of normal. No pericardial effusion. Mediastinum/Nodes: No mediastinal mass or lymphadenopathy. Lungs/Pleura: There is mild dependent only sub solid lung opacity bilaterally which most resembles atelectasis. No consolidation or pleural effusion. Major airways are patent. However, there does appear to be asymmetric left lower lobe bronchial wall thickening on series 6 images 77 and 82. But no convincing peribronchial opacity at this time. Subtle underlying upper lobe centrilobular emphysema. Upper Abdomen: Negative visible essentially noncontrast liver, spleen, pancreas, adrenal glands, kidneys, bowel. Musculoskeletal: Previous sternotomy. No acute or suspicious osseous lesion. Review of the MIP images confirms the above findings. IMPRESSION: 1. Negative for acute pulmonary embolus. 2. Positive for Severe thoracic Aortic Atherosclerosis (ICD10-I70.0), and borderline aneurysmal arch and descending thoracic aorta. Prior CABG. 3. Mild Emphysema (ICD10-J43.9). Asymmetric thickening of the left lower lobe airways suggesting bronchitis. Mild atelectasis with no discrete pneumonia at this time. No pleural effusion. Electronically Signed   By: VEAR Hurst M.D.   On: 07/27/2023 05:35   DG Chest 2 View Result Date: 07/27/2023 CLINICAL DATA:  Shortness of breath, coughing, hypoxemia. EXAM: CHEST - 2 VIEW COMPARISON:  AP Lat chest 02/24/2023 FINDINGS: The lungs are slightly emphysematous but clear. The sulci are  sharp. There is mild cardiomegaly with no evidence of CHF. Sternotomy and CABG changes are again noted with aortic tortuosity and atherosclerosis, stable mediastinum. Thoracic cage is osteopenic. There is thoracic kyphosis and spondylosis, slight dextroscoliosis. IMPRESSION: 1. No evidence of acute chest disease. Emphysema. 2. Mild cardiomegaly.  Status post CABG. 3. Aortic atherosclerosis and uncoiling. Electronically Signed   By: Francis Quam M.D.   On: 07/27/2023 04:09    Micro Results   Recent Results (from the past 240 hours)  Resp panel by RT-PCR (RSV, Flu A&B, Covid) Anterior Nasal Swab     Status: None   Collection Time: 07/27/23  3:11 AM   Specimen: Anterior Nasal Swab  Result Value Ref Range Status   SARS Coronavirus 2 by RT PCR NEGATIVE NEGATIVE Final    Comment: (NOTE) SARS-CoV-2 target nucleic acids are NOT DETECTED.  The SARS-CoV-2 RNA is generally detectable in upper respiratory specimens during the acute phase of infection. The lowest concentration of SARS-CoV-2 viral copies this assay can detect is 138 copies/mL. A negative result does not preclude SARS-Cov-2 infection and should not be used as the sole basis for treatment or other patient management decisions. A negative result may occur with  improper specimen collection/handling, submission of specimen other than nasopharyngeal swab, presence of viral mutation(s) within the areas targeted by this assay, and inadequate number of viral copies(<138 copies/mL). A negative result must be combined with clinical observations, patient history, and epidemiological information. The expected result is Negative.  Fact Sheet for Patients:  bloggercourse.com  Fact Sheet for Healthcare Providers:  seriousbroker.it  This test is no t yet approved or cleared by the [place]  FDA and  has been authorized for detection and/or diagnosis of SARS-CoV-2 by FDA under an Emergency  Use Authorization (EUA). This EUA will remain  in effect (meaning this test can be used) for the duration of the COVID-19 declaration under Section 564(b)(1) of the Act, 21 U.S.C.section 360bbb-3(b)(1), unless the authorization is terminated  or revoked sooner.  Influenza A by PCR NEGATIVE NEGATIVE Final   Influenza B by PCR NEGATIVE NEGATIVE Final    Comment: (NOTE) The Xpert Xpress SARS-CoV-2/FLU/RSV plus assay is intended as an aid in the diagnosis of influenza from Nasopharyngeal swab specimens and should not be used as a sole basis for treatment. Nasal washings and aspirates are unacceptable for Xpert Xpress SARS-CoV-2/FLU/RSV testing.  Fact Sheet for Patients: bloggercourse.com  Fact Sheet for Healthcare Providers: seriousbroker.it  This test is not yet approved or cleared by the [place]  FDA and has been authorized for detection and/or diagnosis of SARS-CoV-2 by FDA under an Emergency Use Authorization (EUA). This EUA will remain in effect (meaning this test can be used) for the duration of the COVID-19 declaration under Section 564(b)(1) of the Act, 21 U.S.C. section 360bbb-3(b)(1), unless the authorization is terminated or revoked.     Resp Syncytial Virus by PCR NEGATIVE NEGATIVE Final    Comment: (NOTE) Fact Sheet for Patients: bloggercourse.com  Fact Sheet for Healthcare Providers: seriousbroker.it  This test is not yet approved or cleared by the [place]  FDA and has been authorized for detection and/or diagnosis of SARS-CoV-2 by FDA under an Emergency Use Authorization (EUA). This EUA will remain in effect (meaning this test can be used) for the duration of the COVID-19 declaration under Section 564(b)(1) of the Act, 21 U.S.C. section 360bbb-3(b)(1), unless the authorization is terminated or revoked.  Performed at [facility], [address] [place], [place] [address]     Today   Subjective    Clifford Jones today has no new complaints   No fever  Or chills       - Hypoxia resolved -Patient has been weaned off oxygen  -95% on room air, 90 to 92% post ambulation on room air -Please note that prior to admission patient has home O2 that he uses at home as needed is advised to continue to use this as needed--patient and wife have an oximeter that he used to check oxygen  at home from time to time     Patient has been seen and examined prior to discharge   Objective   Blood pressure (!) 156/78, pulse 76, temperature (!) 97.4 F (36.3 C), temperature source Oral, resp. rate 20, height 6' (1.829 m), weight 93.4 kg, SpO2 93%.   Intake/Output Summary (Last 24 hours) at 07/28/2023 1016 Last data filed at 07/28/2023 0500 Gross per 24 hour  Intake 960 ml  Output --  Net 960 ml    Exam Gen:- Awake Alert, no acute distress , no conversational dyspnea HEENT:- [place].AT, No sclera icterus Neck-Supple Neck,No JVD,.  Lungs-fair air movement, no wheezing  CV- S1, S2 normal, regular Abd-  +ve B.Sounds, Abd Soft, No tenderness,    Extremity/Skin:- No  edema,   good pulses Psych-affect is appropriate, oriented x3 Neuro-no new focal deficits, no tremors    Data Review   CBC w Diff:  Lab Results  Component Value Date   WBC 8.4 07/27/2023   HGB 12.6 (L) 07/27/2023   HCT 39.7 07/27/2023   PLT 261 07/27/2023   LYMPHOPCT 33 02/24/2023   MONOPCT 8 02/24/2023   EOSPCT 3 02/24/2023   BASOPCT 0 02/24/2023   CMP:  Lab Results  Component Value Date   NA 135 07/27/2023   K 3.9 07/27/2023   CL 100 07/27/2023   CO2 28 07/27/2023   BUN 18 07/27/2023   CREATININE 1.01 07/27/2023   PROT 7.7 07/27/2023   ALBUMIN 4.1 07/27/2023   BILITOT  0.6 07/27/2023   ALKPHOS 61 07/27/2023   AST 17 07/27/2023   ALT 13 07/27/2023   Total Discharge time is about 33 minutes  Rendall Carwin M.D on 07/28/2023 at 10:16 AM  Go to [email or web address] -  for  contact info  [facility] - Office  [phone]

## 2023-07-28 NOTE — Care Management Obs Status (Signed)
 MEDICARE OBSERVATION STATUS NOTIFICATION   Patient Details  Name: Clifford Jones MRN: [id number] Date of Birth: 1950/11/30   Medicare Observation Status Notification Given:  Yes    Neila Bally 07/28/2023, 8:58 AM

## 2023-07-31 ENCOUNTER — Ambulatory Visit: Payer: Medicare HMO | Attending: Cardiology | Admitting: *Deleted

## 2023-07-31 DIAGNOSIS — I513 Intracardiac thrombosis, not elsewhere classified: Secondary | ICD-10-CM | POA: Diagnosis not present

## 2023-07-31 DIAGNOSIS — Z5181 Encounter for therapeutic drug level monitoring: Secondary | ICD-10-CM

## 2023-07-31 LAB — POCT INR: INR: 2.7 (ref 2.0–3.0)

## 2023-07-31 NOTE — Patient Instructions (Signed)
 Continue warfarin 1 tablet daily except 1/2 tablet on Fridays On doxycycline  100mg  bid and prednisone  40mg  daily x 5 days.  Will finish 2/12 Recheck in 2 wks

## 2023-08-17 ENCOUNTER — Ambulatory Visit: Payer: Medicare HMO | Attending: Cardiology | Admitting: *Deleted

## 2023-08-17 DIAGNOSIS — J449 Chronic obstructive pulmonary disease, unspecified: Secondary | ICD-10-CM | POA: Diagnosis not present

## 2023-08-17 DIAGNOSIS — Z125 Encounter for screening for malignant neoplasm of prostate: Secondary | ICD-10-CM | POA: Diagnosis not present

## 2023-08-17 DIAGNOSIS — E782 Mixed hyperlipidemia: Secondary | ICD-10-CM | POA: Diagnosis not present

## 2023-08-17 DIAGNOSIS — Z5181 Encounter for therapeutic drug level monitoring: Secondary | ICD-10-CM

## 2023-08-17 DIAGNOSIS — E7849 Other hyperlipidemia: Secondary | ICD-10-CM | POA: Diagnosis not present

## 2023-08-17 DIAGNOSIS — I513 Intracardiac thrombosis, not elsewhere classified: Secondary | ICD-10-CM | POA: Diagnosis not present

## 2023-08-17 DIAGNOSIS — I2510 Atherosclerotic heart disease of native coronary artery without angina pectoris: Secondary | ICD-10-CM | POA: Diagnosis not present

## 2023-08-17 DIAGNOSIS — Z6829 Body mass index (BMI) 29.0-29.9, adult: Secondary | ICD-10-CM | POA: Diagnosis not present

## 2023-08-17 DIAGNOSIS — E119 Type 2 diabetes mellitus without complications: Secondary | ICD-10-CM | POA: Diagnosis not present

## 2023-08-17 LAB — POCT INR: INR: 3.7 — AB (ref 2.0–3.0)

## 2023-08-17 NOTE — Patient Instructions (Signed)
 Hold warfarin tonight then resume 1 tablet daily except 1/2 tablet on Fridays Recheck in 3 wks

## 2023-08-21 ENCOUNTER — Other Ambulatory Visit: Payer: Self-pay | Admitting: Cardiology

## 2023-08-21 ENCOUNTER — Encounter: Payer: Self-pay | Admitting: *Deleted

## 2023-08-22 ENCOUNTER — Ambulatory Visit: Payer: Medicare HMO | Admitting: Cardiology

## 2023-08-31 DIAGNOSIS — H35033 Hypertensive retinopathy, bilateral: Secondary | ICD-10-CM | POA: Diagnosis not present

## 2023-08-31 DIAGNOSIS — H353124 Nonexudative age-related macular degeneration, left eye, advanced atrophic with subfoveal involvement: Secondary | ICD-10-CM | POA: Diagnosis not present

## 2023-08-31 DIAGNOSIS — H43813 Vitreous degeneration, bilateral: Secondary | ICD-10-CM | POA: Diagnosis not present

## 2023-08-31 DIAGNOSIS — H353114 Nonexudative age-related macular degeneration, right eye, advanced atrophic with subfoveal involvement: Secondary | ICD-10-CM | POA: Diagnosis not present

## 2023-09-04 DIAGNOSIS — Z1331 Encounter for screening for depression: Secondary | ICD-10-CM | POA: Diagnosis not present

## 2023-09-04 DIAGNOSIS — Z6829 Body mass index (BMI) 29.0-29.9, adult: Secondary | ICD-10-CM | POA: Diagnosis not present

## 2023-09-04 DIAGNOSIS — Z0001 Encounter for general adult medical examination with abnormal findings: Secondary | ICD-10-CM | POA: Diagnosis not present

## 2023-09-04 DIAGNOSIS — Z0000 Encounter for general adult medical examination without abnormal findings: Secondary | ICD-10-CM | POA: Diagnosis not present

## 2023-09-04 DIAGNOSIS — Z1389 Encounter for screening for other disorder: Secondary | ICD-10-CM | POA: Diagnosis not present

## 2023-09-06 ENCOUNTER — Ambulatory Visit: Attending: Cardiology | Admitting: *Deleted

## 2023-09-06 DIAGNOSIS — I513 Intracardiac thrombosis, not elsewhere classified: Secondary | ICD-10-CM | POA: Diagnosis not present

## 2023-09-06 DIAGNOSIS — Z5181 Encounter for therapeutic drug level monitoring: Secondary | ICD-10-CM

## 2023-09-06 LAB — POCT INR: INR: 2.9 (ref 2.0–3.0)

## 2023-09-06 NOTE — Patient Instructions (Signed)
Continue warfarin 1 tablet daily except 1/2 tablet on Fridays Recheck in 4 wks

## 2023-09-26 ENCOUNTER — Telehealth: Payer: Self-pay | Admitting: Cardiology

## 2023-09-26 MED ORDER — WARFARIN SODIUM 2 MG PO TABS: ORAL_TABLET | ORAL | 1 refills | Status: DC

## 2023-09-26 NOTE — Telephone Encounter (Signed)
*  STAT* If patient is at the pharmacy, call can be transferred to refill team.   1. Which medications need to be refilled? (please list name of each medication and dose if known) warfarin (COUMADIN) 2 MG tablet   2. Which pharmacy/location (including street and city if local pharmacy) is medication to be sent to?  [facility] - [place], [place] - [address]    3. Do they need a 30 day or 90 day supply? 90   Patient is out of medication

## 2023-09-26 NOTE — Telephone Encounter (Signed)
 Refill request for warfarin:  Last INR was 2.9 on 09/06/23 Next INR due 10/04/23 LOV was 03/07/23  Refill approved.

## 2023-10-04 ENCOUNTER — Ambulatory Visit: Attending: Cardiology | Admitting: *Deleted

## 2023-10-04 DIAGNOSIS — Z5181 Encounter for therapeutic drug level monitoring: Secondary | ICD-10-CM

## 2023-10-04 DIAGNOSIS — I513 Intracardiac thrombosis, not elsewhere classified: Secondary | ICD-10-CM

## 2023-10-04 LAB — POCT INR: INR: 1.5 — AB (ref 2.0–3.0)

## 2023-10-04 NOTE — Patient Instructions (Signed)
 Take warfarin 1 1/2 tablets tonight and tomorrow night then resume 1 tablet daily except 1/2 tablet on Fridays Recheck in 2 wks

## 2023-10-18 ENCOUNTER — Ambulatory Visit: Attending: Cardiology

## 2023-10-18 DIAGNOSIS — I513 Intracardiac thrombosis, not elsewhere classified: Secondary | ICD-10-CM | POA: Diagnosis not present

## 2023-10-18 DIAGNOSIS — Z5181 Encounter for therapeutic drug level monitoring: Secondary | ICD-10-CM | POA: Diagnosis not present

## 2023-10-18 LAB — POCT INR: INR: 2.3 (ref 2.0–3.0)

## 2023-10-18 NOTE — Patient Instructions (Signed)
 Description   Continue on same dosage of Warfarin 1 tablet daily except 1/2 tablet on Fridays Recheck in 4 wks

## 2023-10-24 ENCOUNTER — Ambulatory Visit: Attending: Cardiology | Admitting: Cardiology

## 2023-10-24 ENCOUNTER — Encounter: Payer: Self-pay | Admitting: Cardiology

## 2023-10-24 VITALS — BP 150/75 | HR 66 | Ht 72.0 in | Wt 214.6 lb

## 2023-10-24 DIAGNOSIS — I2583 Coronary atherosclerosis due to lipid rich plaque: Secondary | ICD-10-CM | POA: Diagnosis not present

## 2023-10-24 DIAGNOSIS — I513 Intracardiac thrombosis, not elsewhere classified: Secondary | ICD-10-CM | POA: Diagnosis not present

## 2023-10-24 DIAGNOSIS — Z79899 Other long term (current) drug therapy: Secondary | ICD-10-CM | POA: Diagnosis not present

## 2023-10-24 DIAGNOSIS — E782 Mixed hyperlipidemia: Secondary | ICD-10-CM | POA: Diagnosis not present

## 2023-10-24 DIAGNOSIS — I2510 Atherosclerotic heart disease of native coronary artery without angina pectoris: Secondary | ICD-10-CM | POA: Diagnosis not present

## 2023-10-24 NOTE — Progress Notes (Signed)
 Clinical Summary Clifford Jones is a 73 y.o.male seen today for follow up of the following medical problems.     1. CAD - history of CABG in 2003 -  h/o non-STEMI and PCI with DES to the native RPLA via the RIMA graft. He has residual VG->OM, native OM, distal LAD, and diagonal disease      -NSTEMI 11/2019 - Cardiac catheterization performed on 12/16/2019 showed 80% distal left main with heavy calcification involving the ostium of the left circumflex up to 90%, chronic total occlusion of mid and distal LAD, 80% D1 stenosis, chronic total occlusion of ramus intermedius, distal left circumflex and proximal RCA, widely patent LIMA to LAD and patent RIMA to distal RCA, chronically occluded sequential SVG to D1 and D2, chronically occluded SVG to OM.  SVG to ramus was unable to be engaged however likely occluded RCA was not seen on the ascending aortogram.  Postprocedure, medical therapy was recommended If patient has any refractory angiogram, only interventional target would be distal left main although this will be very challenging given the heavy calcification and severe stenosis in the ostial left circumflex artery.  PCI of D1 could also be considered at the time   07/2020 echo LVEF 50-55%, sluggish apical flow without thrombus   -beta blocker dosing limited due to bradycardia   - no recent chest pains, no SOB/DOE - compliant with meds   2. LE edema -- no recent edema, rarely needs prn lasix     3. HTN -- he reports a component of white coat HTN - weight down 35 lbs since 01/2022 - home bp's 130s/60s-70s   4. Hyperlipidemia - compliant with meds - 08/2021 TC 149 TG 357 HDL 47 LDL 49 - upcoming labs with pcp     5. Ampullary adenoma - noted by prior EGD, s/p ERCP and ampullectomy.        6.LV thrombus Echocardiogram performed on 12/15/2019 showed a small apical LV thrombus measuring 6 x 6 mm, EF 50 to 55%, grade 2 DD, RVSP 28.6 mmHg.     07/2020 ehco: LVEF 50-55%, no thrombus  though sluggish apical flow - no bleeding on coumadin .          7. AAA repair - around 2006    8. DM2 - followed by pcp  Past Medical History:  Diagnosis Date   Ampullary adenoma    Arthritis    Reported by patient   Cancer [facility])    Reported by patient   Claustrophobia    Closed fracture of right distal tibia    COPD (chronic obstructive pulmonary disease) (HCC)    Reported by patient   Coronary artery disease    a. 1999 Inf MI;  b. 2003 CABGx6;  c. 2008 NSTEMI, occluded SVG to marginal and diagonal system. 2 DES placed to marginal system;  c. NSTEMI - 02/2012 [employer]ience Xpedition DES to oS-OM and POBA to mid lesion;  c. 10/2013 NSTEMI/PCI: VG->OM 80 ISR, native OM 80/90 small, LIMA->LAD ok w/ 90 dLAD, D1 80, RIMA->RPL ok w/ 95 in RPL (2.5x14 Resolute DES), nl EF.   Dyspnea    Reported by patient   GERD (gastroesophageal reflux disease)    Hyperlipidemia    Hypertension    Myocardial infarction (HCC)    X 6   Pneumonia    S/P AAA repair    Tobacco abuse    Type 2 diabetes mellitus without complication, without long-term current use of insulin  (HCC)    Wears  dentures    Wears glasses      No Known Allergies   Current Outpatient Medications  Medication Sig Dispense Refill   acetaminophen  (TYLENOL ) 325 MG tablet Take 2 tablets (650 mg total) by mouth every 6 (six) hours as needed for mild pain (pain score 1-3), headache or fever (or Fever >/= 101).     albuterol  (PROVENTIL ) (2.5 MG/3ML) 0.083% nebulizer solution Take 3 mLs (2.5 mg total) by nebulization every 4 (four) hours as needed for wheezing or shortness of breath (cough). 75 mL 5   amLODipine  (NORVASC ) 5 MG tablet TAKE 1 TABLET BY MOUTH DAILY 30 tablet 6   aspirin  EC 81 MG tablet Take 1 tablet (81 mg total) by mouth daily with breakfast. 30 tablet 11   carvedilol  (COREG ) 3.125 MG tablet TAKE 1 TABLET BY MOUTH TWICE DAILY 60 tablet 6   cetirizine (ZYRTEC) 10 MG tablet Take 10 mg by mouth daily as needed for allergies.      furosemide  (LASIX ) 20 MG tablet Take 20 mg by mouth daily as needed for edema.     guaiFENesin  (MUCINEX ) 600 MG 12 hr tablet Take 1 tablet (600 mg total) by mouth 2 (two) times daily. 60 tablet 2   JARDIANCE 25 MG TABS tablet Take 25 mg by mouth daily.     lisinopril  (ZESTRIL ) 2.5 MG tablet Take 1 tablet (2.5 mg total) by mouth daily. 60 tablet 1   magnesium  hydroxide (MILK OF MAGNESIA) 400 MG/5ML suspension Take 15 mLs by mouth daily as needed for mild constipation. 355 mL 0   melatonin 5 MG TABS Take 5 mg by mouth.     nitroGLYCERIN  (NITROSTAT ) 0.4 MG SL tablet Place 1 tablet (0.4 mg total) under the tongue every 5 (five) minutes x 3 doses as needed for chest pain. 25 tablet 4   OXYGEN  Inhale 2 L into the lungs at bedtime as needed (at night as needed).     pantoprazole  (PROTONIX ) 40 MG tablet Take 1 tablet (40 mg total) by mouth 2 (two) times daily. 60 tablet 3   Polyethylene Glycol 400 (BLINK TEARS OP) Place 1 drop into both eyes daily as needed (dry eyes).     RANEXA  1000 MG SR tablet TAKE ONE TABLET BY MOUTH TWICE DAILY 30 tablet 0   rosuvastatin  (CRESTOR ) 40 MG tablet Take 1 tablet (40 mg total) by mouth at bedtime. 90 tablet 1   warfarin (COUMADIN ) 2 MG tablet TAKE 1 TABLET BY MOUTH DAILY EXCEPT 1/2 TABLET ON FRIDAYS OR AS DIRECTED 90 tablet 1   No current facility-administered medications for this visit.     Past Surgical History:  Procedure Laterality Date   ABDOMINAL AORTIC ANEURYSM REPAIR  2006   APPENDECTOMY     BIOPSY  03/08/2021   Procedure: BIOPSY;  Surgeon: Normie Becton., MD;  Location: [facility];  Service: Gastroenterology;;   CATARACT EXTRACTION W/PHACO Right 11/05/2021   Procedure: CATARACT EXTRACTION PHACO AND INTRAOCULAR LENS PLACEMENT (IOC);  Surgeon: Tarri Farm, MD;  Location: [facility];  Service: Ophthalmology;  Laterality: Right;  CDE: 11.33   COLONOSCOPY WITH PROPOFOL  N/A 11/28/2022   Procedure: COLONOSCOPY WITH PROPOFOL ;  Surgeon: Brice Campi  Albino Alu., MD;  Location: [facility];  Service: Gastroenterology;  Laterality: N/A;   COLONOSCOPY WITH PROPOFOL  N/A 05/08/2023   Procedure: COLONOSCOPY WITH PROPOFOL ;  Surgeon: Mansouraty, Albino Alu., MD;  Location: [facility];  Service: Gastroenterology;  Laterality: N/A;   CORONARY ANGIOPLASTY WITH STENT PLACEMENT     CORONARY ARTERY BYPASS  GRAFT  2003   ENDOSCOPIC MUCOSAL RESECTION  11/28/2022   Procedure: ENDOSCOPIC MUCOSAL RESECTION;  Surgeon: Brice Campi Albino Alu., MD;  Location: [facility];  Service: Gastroenterology;;   ENDOSCOPIC MUCOSAL RESECTION N/A 05/08/2023   Procedure: ENDOSCOPIC MUCOSAL RESECTION;  Surgeon: Normie Becton., MD;  Location: [facility];  Service: Gastroenterology;  Laterality: N/A;   ENDOSCOPIC RETROGRADE CHOLANGIOPANCREATOGRAPHY (ERCP) WITH PROPOFOL  N/A 03/25/2019   Procedure: ENDOSCOPIC RETROGRADE CHOLANGIOPANCREATOGRAPHY (ERCP) WITH PROPOFOL ;  Surgeon: Brice Campi Albino Alu., MD;  Location[facility] ENDOSCOPY;  Service: Gastroenterology;  Laterality: N/A;   ENDOSCOPIC RETROGRADE CHOLANGIOPANCREATOGRAPHY (ERCP) WITH PROPOFOL  N/A 03/08/2021   Procedure: ENDOSCOPIC RETROGRADE CHOLANGIOPANCREATOGRAPHY (ERCP) WITH PROPOFOL ;  Surgeon: Brice Campi Albino Alu., MD;  Location: [facility];  Service: Gastroenterology;  Laterality: N/A;   ENDOSCOPIC RETROGRADE CHOLANGIOPANCREATOGRAPHY (ERCP) WITH PROPOFOL  N/A 04/14/2022   Procedure: ENDOSCOPIC RETROGRADE CHOLANGIOPANCREATOGRAPHY (ERCP) WITH PROPOFOL ;  Surgeon: Brice Campi Albino Alu., MD;  Location: [facility];  Service: Gastroenterology;  Laterality: N/A;   ESOPHAGEAL DILATION  07/06/2018   Procedure: ESOPHAGEAL DILATION;  Surgeon: Ruby Corporal, MD;  Location: [facility];  Service: Endoscopy;;   ESOPHAGOGASTRODUODENOSCOPY N/A 07/06/2018   Procedure: ESOPHAGOGASTRODUODENOSCOPY (EGD);  Surgeon: Ruby Corporal, MD;  Location: [facility];  Service: Endoscopy;  Laterality: N/A;    ESOPHAGOGASTRODUODENOSCOPY N/A 08/16/2018   Procedure: ESOPHAGOGASTRODUODENOSCOPY (EGD);  Surgeon: Janel Medford, MD;  Location: [facility];  Service: Endoscopy;  Laterality: N/A;   ESOPHAGOGASTRODUODENOSCOPY (EGD) WITH PROPOFOL  N/A 03/25/2019   Procedure: ESOPHAGOGASTRODUODENOSCOPY (EGD) WITH PROPOFOL ;  Surgeon: Brice Campi Albino Alu., MD;  Location: [facility] ENDOSCOPY;  Service: Gastroenterology;  Laterality: N/A;   EUS N/A 08/16/2018   Procedure: UPPER ENDOSCOPIC ULTRASOUND (EUS) RADIAL;  Surgeon: Janel Medford, MD;  Location: [facility];  Service: Endoscopy;  Laterality: N/A;   EUS N/A 03/25/2019   Procedure: UPPER ENDOSCOPIC ULTRASOUND (EUS) RADIAL;  Surgeon: Normie Becton., MD;  Location: [facility] ENDOSCOPY;  Service: Gastroenterology;  Laterality: N/A;   FOREIGN BODY REMOVAL  07/06/2018   Procedure: FOREIGN BODY REMOVAL;  Surgeon: Ruby Corporal, MD;  Location: [facility];  Service: Endoscopy;;   HEMOSTASIS CLIP PLACEMENT  11/28/2022   Procedure: HEMOSTASIS CLIP PLACEMENT;  Surgeon: Normie Becton., MD;  Location: [facility];  Service: Gastroenterology;;   HEMOSTASIS CLIP PLACEMENT  05/08/2023   Procedure: HEMOSTASIS CLIP PLACEMENT;  Surgeon: Normie Becton., MD;  Location: [facility];  Service: Gastroenterology;;   HERNIA REPAIR     umbicical   LEFT HEART CATHETERIZATION WITH CORONARY ANGIOGRAM N/A 03/05/2012   Procedure: LEFT HEART CATHETERIZATION WITH CORONARY ANGIOGRAM;  Surgeon: Kristopher Pheasant, MD;  Location: [facility] CATH LAB;  Service: Cardiovascular;  Laterality: N/A;   LEFT HEART CATHETERIZATION WITH CORONARY ANGIOGRAM N/A 10/23/2013   Procedure: LEFT HEART CATHETERIZATION WITH CORONARY ANGIOGRAM;  Surgeon: Arlander Bellman, MD;  Location: [facility] CATH LAB;  Service: Cardiovascular;  Laterality: N/A;   LEFT HEART CATHETERIZATION WITH CORONARY/GRAFT ANGIOGRAM  10/22/2013   Procedure: LEFT HEART CATHETERIZATION WITH Estella Helling;  Surgeon: Arleen Lacer,  MD;  Location: [facility] CATH LAB;  Service: Cardiovascular;;   PANCREATIC STENT PLACEMENT  03/25/2019   Procedure: PANCREATIC STENT PLACEMENT;  Surgeon: Brice Campi Albino Alu., MD;  Location: [facility] ENDOSCOPY;  Service: Gastroenterology;;   PANCREATIC STENT PLACEMENT  03/08/2021   Procedure: PANCREATIC STENT PLACEMENT;  Surgeon: Normie Becton., MD;  Location: [facility];  Service: Gastroenterology;;   PANCREATIC STENT PLACEMENT  04/14/2022   Procedure: PANCREATIC STENT PLACEMENT;  Surgeon: Normie Becton., MD;  Location: [facility];  Service: Gastroenterology;;   PERCUTANEOUS CORONARY STENT INTERVENTION (PCI-S) N/A 03/07/2012   Procedure: PERCUTANEOUS CORONARY STENT INTERVENTION (PCI-S);  Surgeon: Arnoldo Lapping, MD;  Location: [facility] CATH LAB;  Service: Cardiovascular;  Laterality: N/A;   PERCUTANEOUS CORONARY STENT INTERVENTION (PCI-S) Right 10/23/2013   Procedure: PERCUTANEOUS CORONARY STENT INTERVENTION (PCI-S);  Surgeon: Arlander Bellman, MD;  Location: [facility] CATH LAB;  Service: Cardiovascular;  Laterality: Right;   POLYPECTOMY  07/06/2018   Procedure: POLYPECTOMY;  Surgeon: Ruby Corporal, MD;  Location: [facility];  Service: Endoscopy;;  polyp at ampulla   POLYPECTOMY  03/25/2019   Procedure: AMPULLECTOMY;  Surgeon: Brice Campi Albino Alu., MD;  Location: [facility] ENDOSCOPY;  Service: Gastroenterology;;   POLYPECTOMY  11/28/2022   Procedure: POLYPECTOMY;  Surgeon: Normie Becton., MD;  Location: [facility];  Service: Gastroenterology;;   POLYPECTOMY  05/08/2023   Procedure: POLYPECTOMY;  Surgeon: Normie Becton., MD;  Location: [facility];  Service: Gastroenterology;;   REMOVAL OF STONES  03/08/2021   Procedure: REMOVAL OF STONES;  Surgeon: Normie Becton., MD;  Location: [facility];  Service: Gastroenterology;;   Right hand surgery     RIGHT HEART CATH AND CORONARY/GRAFT ANGIOGRAPHY N/A 12/16/2019   Procedure: RIGHT HEART CATH AND CORONARY/GRAFT ANGIOGRAPHY;   Surgeon: Sammy Crisp, MD;  Location: [facility] INVASIVE CV LAB;  Service: Cardiovascular;  Laterality: N/A;   SPHINCTEROTOMY  03/25/2019   Procedure: SPHINCTEROTOMY;  Surgeon: Mansouraty, Albino Alu., MD;  Location: [facility] ENDOSCOPY;  Service: Gastroenterology;;   SUBMUCOSAL LIFTING INJECTION  11/28/2022   Procedure: SUBMUCOSAL LIFTING INJECTION;  Surgeon: Normie Becton., MD;  Location: [facility];  Service: Gastroenterology;;   SUBMUCOSAL LIFTING INJECTION  05/08/2023   Procedure: SUBMUCOSAL LIFTING INJECTION;  Surgeon: Normie Becton., MD;  Location: [facility];  Service: Gastroenterology;;   SUBMUCOSAL TATTOO INJECTION  11/28/2022   Procedure: SUBMUCOSAL TATTOO INJECTION;  Surgeon: Normie Becton., MD;  Location: [facility];  Service: Gastroenterology;;   TIBIA IM NAIL INSERTION Right 04/07/2016   Procedure: FIXATION OF RIGHT TIBIA FRACTURE;  Surgeon: Shirlee Dotter, MD;  [facility] OR;  Service: Orthopedics;  Laterality: Right;     No Known Allergies    Family History  Problem Relation Age of Onset   Coronary artery disease Other    Colon cancer Neg Hx    Esophageal cancer Neg Hx    Inflammatory bowel disease Neg Hx    Liver disease Neg Hx    Pancreatic cancer Neg Hx    Rectal cancer Neg Hx    Stomach cancer Neg Hx      Social History Mr. Esperanza reports that he has been smoking cigarettes. He started smoking about 59 years ago. He has a 56 pack-year smoking history. He has never used smokeless tobacco. Mr. Blinn reports current alcohol  use.    Physical Examination Today's Vitals   10/24/23 0952 10/24/23 1020  BP: (!) 158/82 (!) 150/75  Pulse: 66   SpO2: 95%   Weight: 214 lb 9.6 oz (97.3 kg)   Height: 6' (1.829 m)    Body mass index is 29.1 kg/m.  Gen: resting comfortably, no acute distress HEENT: no scleral icterus, pupils equal round and reactive, no palptable cervical adenopathy,  CV: RRR, no m/rg, no jvd Resp: Clear to  auscultation bilaterally GI: abdomen is soft, non-tender, non-distended, normal bowel sounds, no hepatosplenomegaly MSK: extremities are warm, no edema.  Skin: warm, no rash Neuro:  no focal deficits Psych: appropriate affect   Diagnostic Studies  02/2015 echo  Study Conclusions   - Left ventricle: The cavity size was normal. There was mild   concentric hypertrophy. Systolic function was normal. The   estimated ejection fraction was in the range of 55% to 60%.   Possible hypokinesis of the basalinferior myocardium. Marked   bradycardia limits evaluation of LV diastolic function. - Aortic valve: There was trivial regurgitation. - Left atrium: The atrium was moderately dilated. - Right ventricle: Systolic function was mildly reduced.     02/2015 nuclear stress There was no ST segment deviation noted during stress. T wave inversion of 1 mm was noted during stress in the I, II, aVF, V5 and V6 leads. T wave inversion persisted. Defect 1: There is a medium defect of severe severity present in the apical anterior, apical inferior and apex location. Findings consistent with prior myocardial infarction. There is no evidence of ischemia. This is an intermediate risk study. Nuclear stress EF: 39%.     Assessment and Plan   1. CAD - severe history of CAD/extensive disease, have continued ASA while on coumadin  in this setting.  -no symptoms, continue current meds   2. LE edema -controlled, continue prn lasix    3.LV thrombus - 07/2020 echo show resolution, but apical sluggish flow, high risk of recurrence and thus have continued coumadin  indefinitely - discussed chagning to DOAC, he prefers to remain on coumadin  - we will continue coumadin .    4. HTN - consistent white coat HTN pattern at visits, home bp's remain at goal - continue current meds   5. Hyperlipidemia -due for lipid panel, we will order, he plans to have done through his pcp's lab  F/u 6 months    Laurann Pollock, M.D.

## 2023-10-24 NOTE — Patient Instructions (Signed)
 Medication Instructions:   Continue all current medications.   Labwork:  FLP - order given today Reminder:  Nothing to eat or drink after 12 midnight prior to labs. Office will contact with results via phone, letter or mychart.     Testing/Procedures:  none  Follow-Up:  6 months   Any Other Special Instructions Will Be Listed Below (If Applicable).   If you need a refill on your cardiac medications before your next appointment, please call your pharmacy.

## 2023-10-26 DIAGNOSIS — H43813 Vitreous degeneration, bilateral: Secondary | ICD-10-CM | POA: Diagnosis not present

## 2023-10-26 DIAGNOSIS — H353114 Nonexudative age-related macular degeneration, right eye, advanced atrophic with subfoveal involvement: Secondary | ICD-10-CM | POA: Diagnosis not present

## 2023-10-26 DIAGNOSIS — F17200 Nicotine dependence, unspecified, uncomplicated: Secondary | ICD-10-CM | POA: Diagnosis not present

## 2023-10-26 DIAGNOSIS — H35033 Hypertensive retinopathy, bilateral: Secondary | ICD-10-CM | POA: Diagnosis not present

## 2023-10-26 DIAGNOSIS — H353123 Nonexudative age-related macular degeneration, left eye, advanced atrophic without subfoveal involvement: Secondary | ICD-10-CM | POA: Diagnosis not present

## 2023-11-15 ENCOUNTER — Ambulatory Visit: Attending: Cardiology | Admitting: *Deleted

## 2023-11-15 DIAGNOSIS — Z5181 Encounter for therapeutic drug level monitoring: Secondary | ICD-10-CM

## 2023-11-15 DIAGNOSIS — I513 Intracardiac thrombosis, not elsewhere classified: Secondary | ICD-10-CM | POA: Diagnosis not present

## 2023-11-15 LAB — POCT INR: INR: 2.7 (ref 2.0–3.0)

## 2023-11-15 NOTE — Patient Instructions (Signed)
 Continue on same dosage of Warfarin 1 tablet daily except 1/2 tablet on Fridays Recheck in 4 wks

## 2023-12-13 ENCOUNTER — Ambulatory Visit: Attending: Cardiology | Admitting: *Deleted

## 2023-12-13 DIAGNOSIS — I513 Intracardiac thrombosis, not elsewhere classified: Secondary | ICD-10-CM

## 2023-12-13 DIAGNOSIS — Z5181 Encounter for therapeutic drug level monitoring: Secondary | ICD-10-CM | POA: Diagnosis not present

## 2023-12-13 LAB — POCT INR: INR: 1.7 — AB (ref 2.0–3.0)

## 2023-12-13 NOTE — Progress Notes (Signed)
Please see anticoagulation encounter.

## 2023-12-13 NOTE — Patient Instructions (Signed)
 Take warfarin 1 1/2 tablets tonight and tomorrow night then resume 1 tablet daily except 1/2 tablet on Fridays Recheck in 3 wks

## 2023-12-21 DIAGNOSIS — H353114 Nonexudative age-related macular degeneration, right eye, advanced atrophic with subfoveal involvement: Secondary | ICD-10-CM | POA: Diagnosis not present

## 2023-12-21 DIAGNOSIS — H353123 Nonexudative age-related macular degeneration, left eye, advanced atrophic without subfoveal involvement: Secondary | ICD-10-CM | POA: Diagnosis not present

## 2024-01-03 ENCOUNTER — Ambulatory Visit: Attending: Cardiology | Admitting: *Deleted

## 2024-01-03 DIAGNOSIS — I513 Intracardiac thrombosis, not elsewhere classified: Secondary | ICD-10-CM | POA: Insufficient documentation

## 2024-01-03 DIAGNOSIS — Z5181 Encounter for therapeutic drug level monitoring: Secondary | ICD-10-CM | POA: Insufficient documentation

## 2024-01-03 LAB — POCT INR: INR: 1.7 — AB (ref 2.0–3.0)

## 2024-01-03 NOTE — Patient Instructions (Signed)
Increase warfarin to 1 tablet daily except 1 1/2 tablets on Wednesdays Recheck in 3 wks

## 2024-01-24 ENCOUNTER — Ambulatory Visit: Attending: Cardiology | Admitting: *Deleted

## 2024-01-24 DIAGNOSIS — Z5181 Encounter for therapeutic drug level monitoring: Secondary | ICD-10-CM | POA: Diagnosis not present

## 2024-01-24 DIAGNOSIS — I513 Intracardiac thrombosis, not elsewhere classified: Secondary | ICD-10-CM

## 2024-01-24 LAB — POCT INR: INR: 2.6 (ref 2.0–3.0)

## 2024-01-24 NOTE — Patient Instructions (Signed)
 Continue warfarin 1 tablet daily except 1 1/2 tablets on Wednesdays Recheck in 4 wks

## 2024-01-24 NOTE — Progress Notes (Signed)
 INR 2.6; Please see anticoagulation encounter

## 2024-01-29 DIAGNOSIS — M25511 Pain in right shoulder: Secondary | ICD-10-CM | POA: Diagnosis not present

## 2024-01-29 DIAGNOSIS — E7849 Other hyperlipidemia: Secondary | ICD-10-CM | POA: Diagnosis not present

## 2024-01-29 DIAGNOSIS — E119 Type 2 diabetes mellitus without complications: Secondary | ICD-10-CM | POA: Diagnosis not present

## 2024-01-29 DIAGNOSIS — I2510 Atherosclerotic heart disease of native coronary artery without angina pectoris: Secondary | ICD-10-CM | POA: Diagnosis not present

## 2024-01-29 DIAGNOSIS — E782 Mixed hyperlipidemia: Secondary | ICD-10-CM | POA: Diagnosis not present

## 2024-01-29 DIAGNOSIS — Z6829 Body mass index (BMI) 29.0-29.9, adult: Secondary | ICD-10-CM | POA: Diagnosis not present

## 2024-01-30 ENCOUNTER — Encounter: Payer: Self-pay | Admitting: Physician Assistant

## 2024-02-21 ENCOUNTER — Encounter

## 2024-02-21 DIAGNOSIS — H353114 Nonexudative age-related macular degeneration, right eye, advanced atrophic with subfoveal involvement: Secondary | ICD-10-CM | POA: Diagnosis not present

## 2024-02-21 DIAGNOSIS — H35033 Hypertensive retinopathy, bilateral: Secondary | ICD-10-CM | POA: Diagnosis not present

## 2024-02-21 DIAGNOSIS — H353123 Nonexudative age-related macular degeneration, left eye, advanced atrophic without subfoveal involvement: Secondary | ICD-10-CM | POA: Diagnosis not present

## 2024-02-21 DIAGNOSIS — H43813 Vitreous degeneration, bilateral: Secondary | ICD-10-CM | POA: Diagnosis not present

## 2024-02-26 ENCOUNTER — Ambulatory Visit: Attending: Cardiology | Admitting: *Deleted

## 2024-02-26 DIAGNOSIS — Z5181 Encounter for therapeutic drug level monitoring: Secondary | ICD-10-CM

## 2024-02-26 DIAGNOSIS — I513 Intracardiac thrombosis, not elsewhere classified: Secondary | ICD-10-CM | POA: Diagnosis not present

## 2024-02-26 LAB — POCT INR: INR: 1.6 — AB (ref 2.0–3.0)

## 2024-02-26 NOTE — Patient Instructions (Signed)
 Take warfarin 1 1/2 tablets tonight and tomorrow then resume 1 tablet daily except 1 1/2 tablets on Wednesdays. Recheck in 3 wks

## 2024-02-26 NOTE — Progress Notes (Signed)
 INR 1.6; Please see anticoagulation encounter

## 2024-03-16 ENCOUNTER — Other Ambulatory Visit: Payer: Self-pay | Admitting: Cardiology

## 2024-03-17 ENCOUNTER — Other Ambulatory Visit: Payer: Self-pay | Admitting: Cardiology

## 2024-03-18 ENCOUNTER — Ambulatory Visit: Attending: Cardiology | Admitting: *Deleted

## 2024-03-18 DIAGNOSIS — Z5181 Encounter for therapeutic drug level monitoring: Secondary | ICD-10-CM | POA: Insufficient documentation

## 2024-03-18 DIAGNOSIS — I513 Intracardiac thrombosis, not elsewhere classified: Secondary | ICD-10-CM | POA: Insufficient documentation

## 2024-03-18 LAB — POCT INR: INR: 2.5 (ref 2.0–3.0)

## 2024-03-18 NOTE — Patient Instructions (Signed)
 Continue warfarin 1 tablet daily except 1 1/2 tablets on Wednesdays Recheck in 4 wks

## 2024-03-18 NOTE — Progress Notes (Signed)
 INR 2.5. Please see anticoagulation encounter

## 2024-03-22 ENCOUNTER — Other Ambulatory Visit: Payer: Self-pay | Admitting: Cardiology

## 2024-04-09 DIAGNOSIS — I2510 Atherosclerotic heart disease of native coronary artery without angina pectoris: Secondary | ICD-10-CM | POA: Diagnosis not present

## 2024-04-09 DIAGNOSIS — Z23 Encounter for immunization: Secondary | ICD-10-CM | POA: Diagnosis not present

## 2024-04-09 DIAGNOSIS — J449 Chronic obstructive pulmonary disease, unspecified: Secondary | ICD-10-CM | POA: Diagnosis not present

## 2024-04-09 DIAGNOSIS — E119 Type 2 diabetes mellitus without complications: Secondary | ICD-10-CM | POA: Diagnosis not present

## 2024-04-09 DIAGNOSIS — D135 Benign neoplasm of extrahepatic bile ducts: Secondary | ICD-10-CM | POA: Diagnosis not present

## 2024-04-09 DIAGNOSIS — Z6830 Body mass index (BMI) 30.0-30.9, adult: Secondary | ICD-10-CM | POA: Diagnosis not present

## 2024-04-15 ENCOUNTER — Ambulatory Visit: Attending: Cardiology

## 2024-04-16 ENCOUNTER — Encounter: Payer: Self-pay | Admitting: *Deleted

## 2024-04-17 DIAGNOSIS — H353114 Nonexudative age-related macular degeneration, right eye, advanced atrophic with subfoveal involvement: Secondary | ICD-10-CM | POA: Diagnosis not present

## 2024-04-17 DIAGNOSIS — H353123 Nonexudative age-related macular degeneration, left eye, advanced atrophic without subfoveal involvement: Secondary | ICD-10-CM | POA: Diagnosis not present

## 2024-04-24 ENCOUNTER — Ambulatory Visit: Attending: Cardiology | Admitting: *Deleted

## 2024-04-24 DIAGNOSIS — Z5181 Encounter for therapeutic drug level monitoring: Secondary | ICD-10-CM

## 2024-04-24 DIAGNOSIS — I513 Intracardiac thrombosis, not elsewhere classified: Secondary | ICD-10-CM

## 2024-04-24 LAB — POCT INR: INR: 3.1 — AB (ref 2.0–3.0)

## 2024-04-24 NOTE — Patient Instructions (Signed)
 Take warfarin 1 tablet tonight then resume 1 tablet daily except 1 1/2 tablets on Wednesdays. Recheck in 4 wks

## 2024-04-24 NOTE — Progress Notes (Signed)
 INR 3.1  Please see anticoagulation encounter

## 2024-04-26 DIAGNOSIS — E7800 Pure hypercholesterolemia, unspecified: Secondary | ICD-10-CM | POA: Diagnosis not present

## 2024-04-26 DIAGNOSIS — Z7901 Long term (current) use of anticoagulants: Secondary | ICD-10-CM | POA: Diagnosis not present

## 2024-04-26 DIAGNOSIS — Z23 Encounter for immunization: Secondary | ICD-10-CM | POA: Diagnosis not present

## 2024-04-26 DIAGNOSIS — F17210 Nicotine dependence, cigarettes, uncomplicated: Secondary | ICD-10-CM | POA: Diagnosis not present

## 2024-04-26 DIAGNOSIS — S6991XA Unspecified injury of right wrist, hand and finger(s), initial encounter: Secondary | ICD-10-CM | POA: Diagnosis not present

## 2024-04-26 DIAGNOSIS — I10 Essential (primary) hypertension: Secondary | ICD-10-CM | POA: Diagnosis not present

## 2024-04-26 DIAGNOSIS — Z79899 Other long term (current) drug therapy: Secondary | ICD-10-CM | POA: Diagnosis not present

## 2024-04-26 DIAGNOSIS — I2510 Atherosclerotic heart disease of native coronary artery without angina pectoris: Secondary | ICD-10-CM | POA: Diagnosis not present

## 2024-04-26 DIAGNOSIS — Z7982 Long term (current) use of aspirin: Secondary | ICD-10-CM | POA: Diagnosis not present

## 2024-04-26 DIAGNOSIS — W268XXA Contact with other sharp object(s), not elsewhere classified, initial encounter: Secondary | ICD-10-CM | POA: Diagnosis not present

## 2024-04-28 DIAGNOSIS — I10 Essential (primary) hypertension: Secondary | ICD-10-CM | POA: Diagnosis not present

## 2024-04-28 DIAGNOSIS — F17210 Nicotine dependence, cigarettes, uncomplicated: Secondary | ICD-10-CM | POA: Diagnosis not present

## 2024-04-28 DIAGNOSIS — Z7901 Long term (current) use of anticoagulants: Secondary | ICD-10-CM | POA: Diagnosis not present

## 2024-04-28 DIAGNOSIS — E7800 Pure hypercholesterolemia, unspecified: Secondary | ICD-10-CM | POA: Diagnosis not present

## 2024-04-28 DIAGNOSIS — S61411D Laceration without foreign body of right hand, subsequent encounter: Secondary | ICD-10-CM | POA: Diagnosis not present

## 2024-04-28 DIAGNOSIS — S61411A Laceration without foreign body of right hand, initial encounter: Secondary | ICD-10-CM | POA: Diagnosis not present

## 2024-04-28 DIAGNOSIS — I252 Old myocardial infarction: Secondary | ICD-10-CM | POA: Diagnosis not present

## 2024-04-28 DIAGNOSIS — X58XXXA Exposure to other specified factors, initial encounter: Secondary | ICD-10-CM | POA: Diagnosis not present

## 2024-04-28 DIAGNOSIS — Z79899 Other long term (current) drug therapy: Secondary | ICD-10-CM | POA: Diagnosis not present

## 2024-04-28 DIAGNOSIS — Z7982 Long term (current) use of aspirin: Secondary | ICD-10-CM | POA: Diagnosis not present

## 2024-04-28 DIAGNOSIS — I2510 Atherosclerotic heart disease of native coronary artery without angina pectoris: Secondary | ICD-10-CM | POA: Diagnosis not present

## 2024-05-14 ENCOUNTER — Encounter: Payer: Self-pay | Admitting: Cardiology

## 2024-05-14 ENCOUNTER — Ambulatory Visit: Attending: Cardiology | Admitting: Cardiology

## 2024-05-14 VITALS — BP 160/75 | HR 62 | Ht 72.0 in | Wt 218.0 lb

## 2024-05-14 DIAGNOSIS — I10 Essential (primary) hypertension: Secondary | ICD-10-CM | POA: Diagnosis not present

## 2024-05-14 DIAGNOSIS — I2510 Atherosclerotic heart disease of native coronary artery without angina pectoris: Secondary | ICD-10-CM

## 2024-05-14 DIAGNOSIS — E782 Mixed hyperlipidemia: Secondary | ICD-10-CM | POA: Diagnosis not present

## 2024-05-14 DIAGNOSIS — I2583 Coronary atherosclerosis due to lipid rich plaque: Secondary | ICD-10-CM

## 2024-05-14 DIAGNOSIS — R600 Localized edema: Secondary | ICD-10-CM

## 2024-05-14 NOTE — Progress Notes (Signed)
 Clinical Summary Mr. Mullens is a 73 y.o.male seen today for follow up of the following medical problems.     1. CAD - history of CABG in 2003 -  h/o non-STEMI and PCI with DES to the native RPLA via the RIMA graft. He has residual VG->OM, native OM, distal LAD, and diagonal disease      -NSTEMI 11/2019 - Cardiac catheterization performed on 12/16/2019 showed 80% distal left main with heavy calcification involving the ostium of the left circumflex up to 90%, chronic total occlusion of mid and distal LAD, 80% D1 stenosis, chronic total occlusion of ramus intermedius, distal left circumflex and proximal RCA, widely patent LIMA to LAD and patent RIMA to distal RCA, chronically occluded sequential SVG to D1 and D2, chronically occluded SVG to OM.  SVG to ramus was unable to be engaged however likely occluded RCA was not seen on the ascending aortogram.  Postprocedure, medical therapy was recommended If patient has any refractory angiogram, only interventional target would be distal left main although this will be very challenging given the heavy calcification and severe stenosis in the ostial left circumflex artery.  PCI of D1 could also be considered at the time   07/2020 echo LVEF 50-55%, sluggish apical flow without thrombus   -beta blocker dosing limited due to bradycardia   - no chest pains. No SOB/DOE - compliant with meds   2. LE edema --no edema. Has lasix  prn has not needed.      3. HTN -- he reports a component of white coat HTN - weight down 35 lbs since 01/2022 - home bp's 130s/70s   4. Hyperlipidemia - compliant with meds - 08/2021 TC 149 TG 357 HDL 47 LDL 49 - upcoming labs with pcp  01/2024 TC 143 TG 192 HDL 51 LDL 54     5. Ampullary adenoma - noted by prior EGD, s/p ERCP and ampullectomy.        6.LV thrombus Echocardiogram performed on 12/15/2019 showed a small apical LV thrombus measuring 6 x 6 mm, EF 50 to 55%, grade 2 DD, RVSP 28.6 mmHg.     07/2020 ehco:  LVEF 50-55%, no thrombus though sluggish apical flow - no bleeding on coumadin .          7. AAA repair - around 2006     8. DM2 - followed by pcp   Past Medical History:  Diagnosis Date   Ampullary adenoma    Arthritis    Reported by patient   Cancer [facility])    Reported by patient   Claustrophobia    Closed fracture of right distal tibia    COPD (chronic obstructive pulmonary disease) (HCC)    Reported by patient   Coronary artery disease    a. 1999 Inf MI;  b. 2003 CABGx6;  c. 2008 NSTEMI, occluded SVG to marginal and diagonal system. 2 DES placed to marginal system;  c. NSTEMI - 02/2012 [employer]ience Xpedition DES to oS-OM and POBA to mid lesion;  c. 10/2013 NSTEMI/PCI: VG->OM 80 ISR, native OM 80/90 small, LIMA->LAD ok w/ 90 dLAD, D1 80, RIMA->RPL ok w/ 95 in RPL (2.5x14 Resolute DES), nl EF.   Dyspnea    Reported by patient   GERD (gastroesophageal reflux disease)    Hyperlipidemia    Hypertension    Myocardial infarction (HCC)    X 6   Pneumonia    S/P AAA repair    Tobacco abuse    Type 2 diabetes mellitus  without complication, without long-term current use of insulin  (HCC)    Wears dentures    Wears glasses      No Known Allergies   Current Outpatient Medications  Medication Sig Dispense Refill   acetaminophen  (TYLENOL ) 325 MG tablet Take 2 tablets (650 mg total) by mouth every 6 (six) hours as needed for mild pain (pain score 1-3), headache or fever (or Fever >/= 101).     albuterol  (PROVENTIL ) (2.5 MG/3ML) 0.083% nebulizer solution Take 3 mLs (2.5 mg total) by nebulization every 4 (four) hours as needed for wheezing or shortness of breath (cough). 75 mL 5   amLODipine  (NORVASC ) 5 MG tablet TAKE 1 TABLET BY MOUTH DAILY 90 tablet 1   aspirin  EC 81 MG tablet Take 1 tablet (81 mg total) by mouth daily with breakfast. 30 tablet 11   carvedilol  (COREG ) 3.125 MG tablet TAKE 1 TABLET BY MOUTH TWICE DAILY 180 tablet 2   cetirizine (ZYRTEC) 10 MG tablet Take 10 mg by mouth  daily as needed for allergies.     furosemide  (LASIX ) 20 MG tablet Take 20 mg by mouth daily as needed for edema.     guaiFENesin  (MUCINEX ) 600 MG 12 hr tablet Take 1 tablet (600 mg total) by mouth 2 (two) times daily. 60 tablet 2   JARDIANCE 25 MG TABS tablet Take 25 mg by mouth daily.     lisinopril  (ZESTRIL ) 2.5 MG tablet Take 1 tablet (2.5 mg total) by mouth daily. 60 tablet 1   magnesium  hydroxide (MILK OF MAGNESIA) 400 MG/5ML suspension Take 15 mLs by mouth daily as needed for mild constipation. 355 mL 0   melatonin 5 MG TABS Take 5 mg by mouth.     nitroGLYCERIN  (NITROSTAT ) 0.4 MG SL tablet Place 1 tablet (0.4 mg total) under the tongue every 5 (five) minutes x 3 doses as needed for chest pain. 25 tablet 4   OXYGEN  Inhale 2 L into the lungs at bedtime as needed (at night as needed).     pantoprazole  (PROTONIX ) 40 MG tablet Take 1 tablet (40 mg total) by mouth 2 (two) times daily. 60 tablet 3   Polyethylene Glycol 400 (BLINK TEARS OP) Place 1 drop into both eyes daily as needed (dry eyes).     RANEXA  1000 MG SR tablet TAKE ONE TABLET BY MOUTH TWICE DAILY 30 tablet 0   rosuvastatin  (CRESTOR ) 40 MG tablet Take 1 tablet (40 mg total) by mouth at bedtime. 90 tablet 1   warfarin (COUMADIN ) 2 MG tablet TAKE 1 TABLET BY MOUTH DAILY EXCEPT 1/2 TABLET ON FRIDAYS OR AS DIRECTED 90 tablet 1   No current facility-administered medications for this visit.     Past Surgical History:  Procedure Laterality Date   ABDOMINAL AORTIC ANEURYSM REPAIR  2006   APPENDECTOMY     BIOPSY  03/08/2021   Procedure: BIOPSY;  Surgeon: Wilhelmenia Aloha Raddle., MD;  Location: [facility];  Service: Gastroenterology;;   CATARACT EXTRACTION W/PHACO Right 11/05/2021   Procedure: CATARACT EXTRACTION PHACO AND INTRAOCULAR LENS PLACEMENT (IOC);  Surgeon: Harrie Agent, MD;  Location: [facility];  Service: Ophthalmology;  Laterality: Right;  CDE: 11.33   COLONOSCOPY WITH PROPOFOL  N/A 11/28/2022   Procedure: COLONOSCOPY WITH  PROPOFOL ;  Surgeon: Wilhelmenia Aloha Raddle., MD;  Location: [facility];  Service: Gastroenterology;  Laterality: N/A;   COLONOSCOPY WITH PROPOFOL  N/A 05/08/2023   Procedure: COLONOSCOPY WITH PROPOFOL ;  Surgeon: Mansouraty, Aloha Raddle., MD;  Location: [facility];  Service: Gastroenterology;  Laterality: N/A;  CORONARY ANGIOPLASTY WITH STENT PLACEMENT     CORONARY ARTERY BYPASS GRAFT  2003   ENDOSCOPIC MUCOSAL RESECTION  11/28/2022   Procedure: ENDOSCOPIC MUCOSAL RESECTION;  Surgeon: Wilhelmenia Aloha Raddle., MD;  Location: [facility];  Service: Gastroenterology;;   ENDOSCOPIC MUCOSAL RESECTION N/A 05/08/2023   Procedure: ENDOSCOPIC MUCOSAL RESECTION;  Surgeon: Wilhelmenia Aloha Raddle., MD;  Location: [facility];  Service: Gastroenterology;  Laterality: N/A;   ENDOSCOPIC RETROGRADE CHOLANGIOPANCREATOGRAPHY (ERCP) WITH PROPOFOL  N/A 03/25/2019   Procedure: ENDOSCOPIC RETROGRADE CHOLANGIOPANCREATOGRAPHY (ERCP) WITH PROPOFOL ;  Surgeon: Wilhelmenia Aloha Raddle., MD;  Location: [facility] END[facility]COPY;  Service: Gastroenterology;  Laterality: N/A;   ENDOSCOPIC RETROGRADE CHOLANGIOPANCREATOGRAPHY (ERCP) WITH PROPOFOL  N/A 03/08/2021   Procedure: ENDOSCOPIC RETROGRADE CHOLANGIOPANCREATOGRAPHY (ERCP) WITH PROPOFOL ;  Surgeon: Wilhelmenia Aloha Raddle., MD;  Location: [facility];  Service: Gastroenterology;  Laterality: N/A;   ENDOSCOPIC RETROGRADE CHOLANGIOPANCREATOGRAPHY (ERCP) WITH PROPOFOL  N/A 04/14/2022   Procedure: ENDOSCOPIC RETROGRADE CHOLANGIOPANCREATOGRAPHY (ERCP) WITH PROPOFOL ;  Surgeon: Wilhelmenia Aloha Raddle., MD;  Location: [facility];  Service: Gastroenterology;  Laterality: N/A;   ESOPHAGEAL DILATION  07/06/2018   Procedure: ESOPHAGEAL DILATION;  Surgeon: Golda Claudis PENNER, MD;  [facility];  Service: Endoscopy;;   ESOPHAGOGASTRODUODENOSCOPY N/A 07/06/2018   Procedure: ESOPHAGOGASTRODUODENOSCOPY (EGD);  Surgeon: Golda Claudis PENNER, MD;  [facility];  Service: Endoscopy;  Laterality:  N/A;   ESOPHAGOGASTRODUODENOSCOPY N/A 08/16/2018   Procedure: ESOPHAGOGASTRODUODENOSCOPY (EGD);  Surgeon: Teressa Toribio SQUIBB, MD;  Location: [facility];  Service: Endoscopy;  Laterality: N/A;   ESOPHAGOGASTRODUODENOSCOPY (EGD) WITH PROPOFOL  N/A 03/25/2019   Procedure: ESOPHAGOGASTRODUODENOSCOPY (EGD) WITH PROPOFOL ;  Surgeon: Wilhelmenia Aloha Raddle., MD;  Location: [facility] ENDOSCOPY;  Service: Gastroenterology;  Laterality: N/A;   EUS N/A 08/16/2018   Procedure: UPPER ENDOSCOPIC ULTRASOUND (EUS) RADIAL;  Surgeon: Teressa Toribio SQUIBB, MD;  Location: [facility];  Service: Endoscopy;  Laterality: N/A;   EUS N/A 03/25/2019   Procedure: UPPER ENDOSCOPIC ULTRASOUND (EUS) RADIAL;  Surgeon: Wilhelmenia Aloha Raddle., MD;  Location: [facility] ENDOSCOPY;  Service: Gastroenterology;  Laterality: N/A;   FOREIGN BODY REMOVAL  07/06/2018   Procedure: FOREIGN BODY REMOVAL;  Surgeon: Golda Claudis PENNER, MD;  [facility];  Service: Endoscopy;;   HEMOSTASIS CLIP PLACEMENT  11/28/2022   Procedure: HEMOSTASIS CLIP PLACEMENT;  Surgeon: Wilhelmenia Aloha Raddle., MD;  Location: [facility];  Service: Gastroenterology;;   HEMOSTASIS CLIP PLACEMENT  05/08/2023   Procedure: HEMOSTASIS CLIP PLACEMENT;  Surgeon: Wilhelmenia Aloha Raddle., MD;  Location: [facility];  Service: Gastroenterology;;   HERNIA REPAIR     umbicical   LEFT HEART CATHETERIZATION WITH CORONARY ANGIOGRAM N/A 03/05/2012   Procedure: LEFT HEART CATHETERIZATION WITH CORONARY ANGIOGRAM;  Surgeon: Debby JONETTA Como, MD;  Location: [facility] CATH LAB;  Service: Cardiovascular;  Laterality: N/A;   LEFT HEART CATHETERIZATION WITH CORONARY ANGIOGRAM N/A 10/23/2013   Procedure: LEFT HEART CATHETERIZATION WITH CORONARY ANGIOGRAM;  Surgeon: Ozell JONETTA Fell, MD;  Location: [facility] CATH LAB;  Service: Cardiovascular;  Laterality: N/A;   LEFT HEART CATHETERIZATION WITH CORONARY/GRAFT ANGIOGRAM  10/22/2013   Procedure: LEFT HEART CATHETERIZATION WITH EL BILE;  Surgeon: Alm LELON Clay, MD;  Location: [facility] CATH LAB;  Service: Cardiovascular;;   PANCREATIC STENT PLACEMENT  03/25/2019   Procedure: PANCREATIC STENT PLACEMENT;  Surgeon: Wilhelmenia Aloha Raddle., MD;  Location: [facility] ENDOSCOPY;  Service: Gastroenterology;;   PANCREATIC STENT PLACEMENT  03/08/2021   Procedure: PANCREATIC STENT PLACEMENT;  Surgeon: Wilhelmenia Aloha Raddle., MD;  Location: [facility];  Service: Gastroenterology;;   PANCREATIC STENT PLACEMENT  04/14/2022   Procedure: PANCREATIC  STENT PLACEMENT;  Surgeon: Wilhelmenia Aloha Raddle., MD;  Location: [facility];  Service: Gastroenterology;;   PERCUTANEOUS CORONARY STENT INTERVENTION (PCI-S) N/A 03/07/2012   Procedure: PERCUTANEOUS CORONARY STENT INTERVENTION (PCI-S);  Surgeon: Ozell Fell, MD;  Location: [facility] CATH LAB;  Service: Cardiovascular;  Laterality: N/A;   PERCUTANEOUS CORONARY STENT INTERVENTION (PCI-S) Right 10/23/2013   Procedure: PERCUTANEOUS CORONARY STENT INTERVENTION (PCI-S);  Surgeon: Ozell JONETTA Fell, MD;  Location: [facility] CATH LAB;  Service: Cardiovascular;  Laterality: Right;   POLYPECTOMY  07/06/2018   Procedure: POLYPECTOMY;  Surgeon: Golda Claudis PENNER, MD;  [facility];  Service: Endoscopy;;  polyp at ampulla   POLYPECTOMY  03/25/2019   Procedure: AMPULLECTOMY;  Surgeon: Wilhelmenia Aloha Raddle., MD;  Location: [facility] ENDOSCOPY;  Service: Gastroenterology;;   POLYPECTOMY  11/28/2022   Procedure: POLYPECTOMY;  Surgeon: Wilhelmenia Aloha Raddle., MD;  Location: [facility];  Service: Gastroenterology;;   POLYPECTOMY  05/08/2023   Procedure: POLYPECTOMY;  Surgeon: Wilhelmenia Aloha Raddle., MD;  Location: [facility];  Service: Gastroenterology;;   REMOVAL OF STONES  03/08/2021   Procedure: REMOVAL OF STONES;  Surgeon: Wilhelmenia Aloha Raddle., MD;  Location: [facility];  Service: Gastroenterology;;   Right hand surgery     RIGHT HEART CATH AND CORONARY/GRAFT ANGIOGRAPHY N/A 12/16/2019   Procedure: RIGHT HEART CATH AND CORONARY/GRAFT  ANGIOGRAPHY;  Surgeon: Mady Bruckner, MD;  Location: [facility];  Service: Cardiovascular;  Laterality: N/A;   SPHINCTEROTOMY  03/25/2019   Procedure: SPHINCTEROTOMY;  Surgeon: Mansouraty, Aloha Raddle., MD;  Location: [facility] ENDOSCOPY;  Service: Gastroenterology;;   SUBMUCOSAL LIFTING INJECTION  11/28/2022   Procedure: SUBMUCOSAL LIFTING INJECTION;  Surgeon: Wilhelmenia Aloha Raddle., MD;  Location: [facility];  Service: Gastroenterology;;   SUBMUCOSAL LIFTING INJECTION  05/08/2023   Procedure: SUBMUCOSAL LIFTING INJECTION;  Surgeon: Wilhelmenia Aloha Raddle., MD;  Location: [facility];  Service: Gastroenterology;;   SUBMUCOSAL TATTOO INJECTION  11/28/2022   Procedure: SUBMUCOSAL TATTOO INJECTION;  Surgeon: Wilhelmenia Aloha Raddle., MD;  Location: [facility];  Service: Gastroenterology;;   TIBIA IM NAIL INSERTION Right 04/07/2016   Procedure: FIXATION OF RIGHT TIBIA FRACTURE;  Surgeon: Maude LELON Right, MD;  Location: [facility] OR;  Service: Orthopedics;  Laterality: Right;     No Known Allergies    Family History  Problem Relation Age of Onset   Coronary artery disease Other    Colon cancer Neg Hx    Esophageal cancer Neg Hx    Inflammatory bowel disease Neg Hx    Liver disease Neg Hx    Pancreatic cancer Neg Hx    Rectal cancer Neg Hx    Stomach cancer Neg Hx      Social History Mr. Santana reports that he has been smoking cigarettes. He started smoking about 59 years ago. He has a 56 pack-year smoking history. He has never used smokeless tobacco. Mr. Vos reports current alcohol  use.     Physical Examination Today's Vitals   05/14/24 1441 05/14/24 1513  BP: (!) 196/86 (!) 160/75  Pulse: 62   Weight: 218 lb (98.9 kg)   Height: 6' (1.829 m)    Body mass index is 29.57 kg/m.  Gen: resting comfortably, no acute distress HEENT: no scleral icterus, pupils equal round and reactive, no palptable cervical adenopathy,  CV: RRR, no m/rg, no jvd Resp: Clear to auscultation  bilaterally GI: abdomen is soft, non-tender, non-distended, normal bowel sounds, no hepatosplenomegaly MSK: extremities are warm, no edema.  Skin: warm, no rash Neuro:  no focal deficits Psych: appropriate  affect   Diagnostic Studies  02/2015 echo Study Conclusions   - Left ventricle: The cavity size was normal. There was mild   concentric hypertrophy. Systolic function was normal. The   estimated ejection fraction was in the range of 55% to 60%.   Possible hypokinesis of the basalinferior myocardium. Marked   bradycardia limits evaluation of LV diastolic function. - Aortic valve: There was trivial regurgitation. - Left atrium: The atrium was moderately dilated. - Right ventricle: Systolic function was mildly reduced.     02/2015 nuclear stress There was no ST segment deviation noted during stress. T wave inversion of 1 mm was noted during stress in the I, II, aVF, V5 and V6 leads. T wave inversion persisted. Defect 1: There is a medium defect of severe severity present in the apical anterior, apical inferior and apex location. Findings consistent with prior myocardial infarction. There is no evidence of ischemia. This is an intermediate risk study. Nuclear stress EF: 39%.       Assessment and Plan   1. CAD - severe history of CAD/extensive disease, have continued ASA while on coumadin  in this setting.  -- no symptoms, continue current meds - EKG shows SR, no ischemic changes  2. LE edema -no recent issues, has prn lasix  but has not needed.    3.LV thrombus - 07/2020 echo show resolution, but apical sluggish flow, high risk of recurrence and thus have continued coumadin  indefinitely - discussed chagning to DOAC, he prefers to remain on coumadin  - continue coumadin    4. HTN - consistent white coat HTN pattern at visits, home bp's remain at goal - we will continue current therapy   5. Hyperlipidemia -LDL at goal, discussed dietary changes to lower TGs further.      Dorn PHEBE Ross, M.D.

## 2024-05-14 NOTE — Patient Instructions (Signed)
 Medication Instructions:  Continue all current medications.   Labwork: none  Testing/Procedures: none  Follow-Up: 6 months   Any Other Special Instructions Will Be Listed Below (If Applicable).   If you need a refill on your cardiac medications before your next appointment, please call your pharmacy.

## 2024-05-22 ENCOUNTER — Ambulatory Visit: Attending: Cardiology | Admitting: *Deleted

## 2024-05-22 DIAGNOSIS — I513 Intracardiac thrombosis, not elsewhere classified: Secondary | ICD-10-CM

## 2024-05-22 DIAGNOSIS — Z5181 Encounter for therapeutic drug level monitoring: Secondary | ICD-10-CM | POA: Diagnosis not present

## 2024-05-22 LAB — POCT INR: INR: 2.6 (ref 2.0–3.0)

## 2024-05-22 NOTE — Progress Notes (Signed)
 INR 2.6; Please see anticoagulation encounter

## 2024-05-22 NOTE — Patient Instructions (Signed)
 Continue warfarin 1 tablet daily except 1 1/2 tablets on Wednesdays Recheck in 4 wks

## 2024-06-19 ENCOUNTER — Ambulatory Visit: Attending: Cardiology | Admitting: *Deleted

## 2024-06-19 DIAGNOSIS — I513 Intracardiac thrombosis, not elsewhere classified: Secondary | ICD-10-CM | POA: Diagnosis not present

## 2024-06-19 DIAGNOSIS — Z5181 Encounter for therapeutic drug level monitoring: Secondary | ICD-10-CM

## 2024-06-19 LAB — POCT INR: INR: 2.5 (ref 2.0–3.0)

## 2024-06-19 NOTE — Progress Notes (Signed)
 INR 2.5. Please see anticoagulation encounter

## 2024-06-19 NOTE — Patient Instructions (Signed)
 Continue warfarin 1 tablet daily except 1 1/2 tablets on Wednesdays. Recheck in 6 wks

## 2024-06-26 ENCOUNTER — Other Ambulatory Visit: Payer: Self-pay | Admitting: Cardiology

## 2024-06-27 MED ORDER — ASPIRIN 81 MG PO TBEC: 81.0000 mg | DELAYED_RELEASE_TABLET | Freq: Every day | ORAL | 11 refills | Status: AC

## 2024-07-04 ENCOUNTER — Other Ambulatory Visit: Payer: Self-pay

## 2024-07-04 MED ORDER — LISINOPRIL 2.5 MG PO TABS: 2.5000 mg | ORAL_TABLET | Freq: Every day | ORAL | 1 refills | Status: AC

## 2024-07-31 ENCOUNTER — Ambulatory Visit
